# Patient Record
Sex: Male | Born: 1970 | Race: White | Hispanic: No | Marital: Single | Smoking: Current every day smoker
Health system: Southern US, Community
[De-identification: ages and names within clinical notes are randomized; demographics above are authoritative.]

## PROBLEM LIST (undated history)

## (undated) DIAGNOSIS — Z720 Tobacco use: Secondary | ICD-10-CM

## (undated) DIAGNOSIS — M869 Osteomyelitis, unspecified: Secondary | ICD-10-CM

## (undated) DIAGNOSIS — G8929 Other chronic pain: Secondary | ICD-10-CM

## (undated) DIAGNOSIS — M549 Dorsalgia, unspecified: Secondary | ICD-10-CM

## (undated) DIAGNOSIS — D509 Iron deficiency anemia, unspecified: Secondary | ICD-10-CM

## (undated) DIAGNOSIS — I10 Essential (primary) hypertension: Secondary | ICD-10-CM

## (undated) DIAGNOSIS — J449 Chronic obstructive pulmonary disease, unspecified: Secondary | ICD-10-CM

## (undated) DIAGNOSIS — F1990 Other psychoactive substance use, unspecified, uncomplicated: Secondary | ICD-10-CM

## (undated) DIAGNOSIS — I509 Heart failure, unspecified: Secondary | ICD-10-CM

## (undated) DIAGNOSIS — D696 Thrombocytopenia, unspecified: Secondary | ICD-10-CM

## (undated) DIAGNOSIS — R161 Splenomegaly, not elsewhere classified: Secondary | ICD-10-CM

## (undated) DIAGNOSIS — Z9981 Dependence on supplemental oxygen: Secondary | ICD-10-CM

## (undated) HISTORY — PX: BACK SURGERY: SHX140

## (undated) HISTORY — DX: Dependence on supplemental oxygen: Z99.81

## (undated) HISTORY — DX: Chronic obstructive pulmonary disease, unspecified: J44.9

## (undated) HISTORY — PX: FRACTURE SURGERY: SHX138

## (undated) HISTORY — DX: Heart failure, unspecified: I50.9

## (undated) HISTORY — DX: Splenomegaly, not elsewhere classified: R16.1

## (undated) HISTORY — DX: Thrombocytopenia, unspecified: D69.6

## (undated) HISTORY — DX: Iron deficiency anemia, unspecified: D50.9

---

## 1998-02-19 ENCOUNTER — Encounter: Payer: Self-pay | Admitting: Emergency Medicine

## 1998-02-19 ENCOUNTER — Emergency Department (HOSPITAL_COMMUNITY): Admission: EM | Admit: 1998-02-19 | Discharge: 1998-02-19 | Payer: Self-pay | Admitting: Emergency Medicine

## 2000-07-14 DIAGNOSIS — F1110 Opioid abuse, uncomplicated: Secondary | ICD-10-CM | POA: Diagnosis present

## 2000-07-14 DIAGNOSIS — B1920 Unspecified viral hepatitis C without hepatic coma: Secondary | ICD-10-CM | POA: Diagnosis present

## 2000-07-14 DIAGNOSIS — F1410 Cocaine abuse, uncomplicated: Secondary | ICD-10-CM | POA: Diagnosis present

## 2000-07-14 DIAGNOSIS — I509 Heart failure, unspecified: Secondary | ICD-10-CM | POA: Diagnosis present

## 2000-07-14 DIAGNOSIS — F17210 Nicotine dependence, cigarettes, uncomplicated: Secondary | ICD-10-CM | POA: Diagnosis present

## 2000-07-14 DIAGNOSIS — Z9981 Dependence on supplemental oxygen: Secondary | ICD-10-CM

## 2000-07-14 DIAGNOSIS — I110 Hypertensive heart disease with heart failure: Secondary | ICD-10-CM | POA: Diagnosis present

## 2000-07-14 DIAGNOSIS — E872 Acidosis: Secondary | ICD-10-CM | POA: Diagnosis present

## 2000-07-14 DIAGNOSIS — T401X1A Poisoning by heroin, accidental (unintentional), initial encounter: Principal | ICD-10-CM | POA: Diagnosis present

## 2000-07-14 DIAGNOSIS — I272 Pulmonary hypertension, unspecified: Secondary | ICD-10-CM | POA: Diagnosis present

## 2000-07-14 DIAGNOSIS — E875 Hyperkalemia: Secondary | ICD-10-CM | POA: Diagnosis present

## 2000-07-14 DIAGNOSIS — K219 Gastro-esophageal reflux disease without esophagitis: Secondary | ICD-10-CM | POA: Diagnosis present

## 2000-07-14 DIAGNOSIS — Z79899 Other long term (current) drug therapy: Secondary | ICD-10-CM

## 2000-07-14 DIAGNOSIS — J9602 Acute respiratory failure with hypercapnia: Secondary | ICD-10-CM | POA: Diagnosis present

## 2000-07-14 DIAGNOSIS — Z20822 Contact with and (suspected) exposure to covid-19: Secondary | ICD-10-CM | POA: Diagnosis present

## 2000-07-14 DIAGNOSIS — J439 Emphysema, unspecified: Secondary | ICD-10-CM | POA: Diagnosis present

## 2000-07-30 ENCOUNTER — Encounter: Payer: Self-pay | Admitting: Emergency Medicine

## 2000-07-30 ENCOUNTER — Emergency Department (HOSPITAL_COMMUNITY): Admission: EM | Admit: 2000-07-30 | Discharge: 2000-07-30 | Payer: Self-pay | Admitting: Emergency Medicine

## 2002-09-02 ENCOUNTER — Emergency Department (HOSPITAL_COMMUNITY): Admission: EM | Admit: 2002-09-02 | Discharge: 2002-09-02 | Payer: Self-pay | Admitting: Emergency Medicine

## 2002-12-09 ENCOUNTER — Encounter: Payer: Self-pay | Admitting: Emergency Medicine

## 2002-12-09 ENCOUNTER — Emergency Department (HOSPITAL_COMMUNITY): Admission: EM | Admit: 2002-12-09 | Discharge: 2002-12-10 | Payer: Self-pay | Admitting: Emergency Medicine

## 2010-12-31 ENCOUNTER — Emergency Department (HOSPITAL_COMMUNITY): Payer: Self-pay

## 2010-12-31 ENCOUNTER — Encounter (HOSPITAL_COMMUNITY): Payer: Self-pay

## 2010-12-31 ENCOUNTER — Emergency Department (HOSPITAL_COMMUNITY)
Admission: EM | Admit: 2010-12-31 | Discharge: 2011-01-01 | Disposition: A | Discharge status: Home / Self Care | Payer: Self-pay | Attending: Emergency Medicine | Admitting: Emergency Medicine

## 2010-12-31 DIAGNOSIS — R109 Unspecified abdominal pain: Secondary | ICD-10-CM | POA: Insufficient documentation

## 2010-12-31 DIAGNOSIS — R112 Nausea with vomiting, unspecified: Secondary | ICD-10-CM | POA: Insufficient documentation

## 2010-12-31 LAB — COMPREHENSIVE METABOLIC PANEL
ALT: 29 U/L (ref 0–53)
Albumin: 3.9 g/dL (ref 3.5–5.2)
Calcium: 10.1 mg/dL (ref 8.4–10.5)
GFR calc Af Amer: >90 mL/min (ref >90)
Glucose, Bld: 130 mg/dL — ABNORMAL HIGH (ref 70–99)
Sodium: 137 mEq/L (ref 135–145)
Total Protein: 7.7 g/dL (ref 6.0–8.3)

## 2010-12-31 LAB — PROTIME-INR
INR: 1.06 (ref 0.00–1.49)
Prothrombin Time: 14 seconds (ref 11.6–15.2)

## 2010-12-31 LAB — URINALYSIS, ROUTINE W REFLEX MICROSCOPIC
Bilirubin Urine: NEGATIVE
Glucose, UA: NEGATIVE mg/dL
Ketones, ur: NEGATIVE mg/dL
Leukocytes, UA: NEGATIVE
Nitrite: NEGATIVE
Protein, ur: NEGATIVE mg/dL
Specific Gravity, Urine: 1.023 (ref 1.005–1.030)
Urobilinogen, UA: 0.2 mg/dL (ref 0.0–1.0)
pH: 5 (ref 5.0–8.0)

## 2010-12-31 LAB — URINE MICROSCOPIC-ADD ON

## 2010-12-31 LAB — CBC
MCV: 92.1 fL (ref 78.0–100.0)
Platelets: 191 10*3/uL (ref 150–400)
RDW: 13.7 % (ref 11.5–15.5)
WBC: 10.6 10*3/uL — ABNORMAL HIGH (ref 4.0–10.5)

## 2010-12-31 LAB — DIFFERENTIAL
Basophils Absolute: 0 10*3/uL (ref 0.0–0.1)
Basophils Relative: 0 % (ref 0–1)
Eosinophils Absolute: 0.3 10*3/uL (ref 0.0–0.7)
Eosinophils Relative: 3 % (ref 0–5)
Neutrophils Relative %: 73 % (ref 43–77)

## 2011-02-28 ENCOUNTER — Ambulatory Visit: Payer: Self-pay

## 2011-02-28 DIAGNOSIS — IMO0002 Reserved for concepts with insufficient information to code with codable children: Secondary | ICD-10-CM

## 2011-02-28 DIAGNOSIS — M79609 Pain in unspecified limb: Secondary | ICD-10-CM

## 2012-02-06 ENCOUNTER — Encounter (HOSPITAL_COMMUNITY): Payer: Self-pay | Admitting: *Deleted

## 2012-02-06 ENCOUNTER — Emergency Department (HOSPITAL_COMMUNITY)
Admission: EM | Admit: 2012-02-06 | Discharge: 2012-02-06 | Disposition: A | Discharge status: Home / Self Care | Payer: Self-pay | Attending: Emergency Medicine | Admitting: Emergency Medicine

## 2012-02-06 DIAGNOSIS — F17200 Nicotine dependence, unspecified, uncomplicated: Secondary | ICD-10-CM | POA: Insufficient documentation

## 2012-02-06 DIAGNOSIS — G8929 Other chronic pain: Secondary | ICD-10-CM | POA: Insufficient documentation

## 2012-02-06 MED ORDER — OXYCODONE HCL 5 MG PO TABS
10.0000 mg | ORAL_TABLET | Freq: Once | ORAL | Status: AC
  Administered 2012-02-06: 10 mg via ORAL
  Filled 2012-02-06: qty 2

## 2012-02-06 MED ORDER — IBUPROFEN 600 MG PO TABS: 600.0000 mg | ORAL_TABLET | Freq: Four times a day (QID) | ORAL | Status: DC | PRN

## 2012-02-06 MED ORDER — OXYCODONE HCL 10 MG PO TABS: 10.0000 mg | ORAL_TABLET | Freq: Four times a day (QID) | ORAL | Status: DC | PRN

## 2012-02-06 MED ORDER — KETOROLAC TROMETHAMINE 30 MG/ML IJ SOLN
30.0000 mg | Freq: Once | INTRAMUSCULAR | Status: AC
  Administered 2012-02-06: 30 mg via INTRAMUSCULAR
  Filled 2012-02-06: qty 1

## 2012-02-06 NOTE — ED Notes (Signed)
Pt states he was lifting something yesterday and he has pain down his bilateral legs, he states he fell July 2011 and had multiple injuries from a 28 foot fall

## 2012-02-06 NOTE — ED Provider Notes (Signed)
History     CSN: [id number]  Arrival date & time 02/06/12  0124   First MD Initiated Contact with Patient 02/06/12 0159      Chief Complaint  Patient presents with  . Back Pain    (Consider location/radiation/quality/duration/timing/severity/associated sxs/prior treatment) HPI Comments: Patient with a history of chronic back pain.  Since 2008 19 had a fall.  States, that yesterday.  He was helping his aunt move and lifting heavy furniture.  Now has lumbar pain.  That radiates bilaterally to both hips and thighs.  He is been taking his gabapentin, but no other medication.  He denies dysuria, incontinence, constipation/diarrhea, weakness, decreased perineal sensation.  Patient is a 41 y.o. male presenting with back pain.  Back Pain  This is a new problem. The problem occurs constantly. The pain is associated with lifting heavy objects. Pertinent negatives include no fever, no numbness, no dysuria and no weakness.    History reviewed. No pertinent past medical history.  Past Surgical History  Procedure Date  . Back surgery     History reviewed. No pertinent family history.  History  Substance Use Topics  . Smoking status: Current Every Day Smoker -- 1.0 packs/day  . Smokeless tobacco: Not on file  . Alcohol Use: No      Review of Systems  Constitutional: Negative for fever and chills.  HENT: Negative.   Respiratory: Negative for shortness of breath.   Gastrointestinal: Negative for nausea.  Genitourinary: Negative for dysuria, frequency and decreased urine volume.  Musculoskeletal: Positive for back pain.  Skin: Negative for rash and wound.  Neurological: Negative for weakness and numbness.    Allergies  Tylenol  Home Medications   Current Outpatient Rx  Name  Route  Sig  Dispense  Refill  . GABAPENTIN 300 MG PO CAPS   Oral   Take 600 mg by mouth 3 (three) times daily.         . IBUPROFEN 600 MG PO TABS   Oral   Take 1 tablet (600 mg total) by mouth  every 6 (six) hours as needed for pain.   30 tablet   0   . OXYCODONE HCL 10 MG PO TABS   Oral   Take 1 tablet (10 mg total) by mouth every 6 (six) hours as needed for pain.   10 tablet   0     BP 112/81  Pulse 101  Temp 98.4 F (36.9 C) (Oral)  Resp 20  Ht 5\' 11"  (1.803 m)  Wt 185 lb (83.915 kg)  BMI 25.80 kg/m2  SpO2 96%  Physical Exam  Constitutional: He appears well-developed and well-nourished.  Eyes: Pupils are equal, round, and reactive to light.  Neck: Normal range of motion.  Cardiovascular: Normal rate.   Pulmonary/Chest: Effort normal.  Musculoskeletal: Normal range of motion. He exhibits tenderness. He exhibits no edema.       Lumbar back: He exhibits tenderness, pain and spasm. He exhibits normal range of motion, no bony tenderness, no swelling, no edema, no deformity and no laceration.       Back:  Neurological: He is alert.  Skin: No rash noted. No erythema. No pallor.    ED Course  Procedures (including critical care time)  Labs Reviewed - No data to display No results found.   1. Exacerbation of chronic back pain       MDM   Exacerbation of low back pain, with no new symptoms.  No incontinence.  No decreased sensation in  the perineal area.         Arman Filter, NP 02/06/12 1610  Arman Filter, NP 02/06/12 [phone]

## 2012-02-06 NOTE — ED Provider Notes (Addendum)
Medical screening examination/treatment/procedure(s) were performed by non-physician practitioner and as supervising physician I was immediately available for consultation/collaboration.  Sunnie Nielsen, MD 02/06/12 1610  Sunnie Nielsen, MD 02/06/12 [phone]

## 2012-02-10 ENCOUNTER — Encounter (HOSPITAL_COMMUNITY): Payer: Self-pay | Admitting: *Deleted

## 2012-02-10 ENCOUNTER — Emergency Department (HOSPITAL_COMMUNITY): Payer: Self-pay

## 2012-02-10 ENCOUNTER — Encounter (HOSPITAL_COMMUNITY): Payer: Self-pay | Admitting: Emergency Medicine

## 2012-02-10 ENCOUNTER — Emergency Department (HOSPITAL_COMMUNITY)
Admission: EM | Admit: 2012-02-10 | Discharge: 2012-02-10 | Disposition: A | Discharge status: Home / Self Care | Payer: Self-pay | Attending: Emergency Medicine | Admitting: Emergency Medicine

## 2012-02-10 DIAGNOSIS — R319 Hematuria, unspecified: Secondary | ICD-10-CM | POA: Insufficient documentation

## 2012-02-10 DIAGNOSIS — M549 Dorsalgia, unspecified: Secondary | ICD-10-CM | POA: Insufficient documentation

## 2012-02-10 DIAGNOSIS — F17200 Nicotine dependence, unspecified, uncomplicated: Secondary | ICD-10-CM | POA: Insufficient documentation

## 2012-02-10 DIAGNOSIS — G8929 Other chronic pain: Secondary | ICD-10-CM | POA: Insufficient documentation

## 2012-02-10 DIAGNOSIS — R209 Unspecified disturbances of skin sensation: Secondary | ICD-10-CM | POA: Insufficient documentation

## 2012-02-10 DIAGNOSIS — Z79899 Other long term (current) drug therapy: Secondary | ICD-10-CM | POA: Insufficient documentation

## 2012-02-10 DIAGNOSIS — Z9889 Other specified postprocedural states: Secondary | ICD-10-CM | POA: Insufficient documentation

## 2012-02-10 DIAGNOSIS — R0789 Other chest pain: Secondary | ICD-10-CM | POA: Insufficient documentation

## 2012-02-10 DIAGNOSIS — R110 Nausea: Secondary | ICD-10-CM | POA: Insufficient documentation

## 2012-02-10 DIAGNOSIS — R109 Unspecified abdominal pain: Secondary | ICD-10-CM | POA: Insufficient documentation

## 2012-02-10 LAB — URINALYSIS, ROUTINE W REFLEX MICROSCOPIC
Glucose, UA: NEGATIVE mg/dL
Hgb urine dipstick: NEGATIVE
Specific Gravity, Urine: 1.022 (ref 1.005–1.030)

## 2012-02-10 LAB — CBC
Hemoglobin: 17.3 g/dL — ABNORMAL HIGH (ref 13.0–17.0)
MCH: 31.2 pg (ref 26.0–34.0)
RBC: 5.55 MIL/uL (ref 4.22–5.81)

## 2012-02-10 LAB — BASIC METABOLIC PANEL
CO2: 20 mEq/L (ref 19–32)
Glucose, Bld: 90 mg/dL (ref 70–99)
Potassium: 4 mEq/L (ref 3.5–5.1)
Sodium: 138 mEq/L (ref 135–145)

## 2012-02-10 LAB — POCT I-STAT TROPONIN I: Troponin i, poc: 0 ng/mL (ref 0.00–0.08)

## 2012-02-10 MED ORDER — CYCLOBENZAPRINE HCL 10 MG PO TABS: 10.0000 mg | ORAL_TABLET | Freq: Three times a day (TID) | ORAL | Status: DC | PRN

## 2012-02-10 MED ORDER — CYCLOBENZAPRINE HCL 10 MG PO TABS
10.0000 mg | ORAL_TABLET | Freq: Once | ORAL | Status: AC
  Administered 2012-02-10: 10 mg via ORAL
  Filled 2012-02-10: qty 1

## 2012-02-10 MED ORDER — KETOROLAC TROMETHAMINE 60 MG/2ML IM SOLN
60.0000 mg | Freq: Once | INTRAMUSCULAR | Status: AC
  Administered 2012-02-10: 60 mg via INTRAMUSCULAR
  Filled 2012-02-10: qty 2

## 2012-02-10 MED ORDER — OXYCODONE HCL 5 MG PO TABS
10.0000 mg | ORAL_TABLET | Freq: Once | ORAL | Status: AC
  Administered 2012-02-10: 10 mg via ORAL
  Filled 2012-02-10: qty 2

## 2012-02-10 MED ORDER — GABAPENTIN 300 MG PO CAPS
300.0000 mg | ORAL_CAPSULE | Freq: Once | ORAL | Status: AC
  Administered 2012-02-10: 300 mg via ORAL
  Filled 2012-02-10 (×2): qty 1

## 2012-02-10 MED ORDER — SODIUM CHLORIDE 0.9 % IV BOLUS (SEPSIS)
1000.0000 mL | Freq: Once | INTRAVENOUS | Status: AC
  Administered 2012-02-10: 1000 mL via INTRAVENOUS

## 2012-02-10 MED ORDER — GABAPENTIN 300 MG PO CAPS: 300.0000 mg | ORAL_CAPSULE | Freq: Three times a day (TID) | ORAL | Status: DC

## 2012-02-10 NOTE — ED Notes (Signed)
Pt presents w/ back and left leg pain shooting all the way down to his foot. Has old injury which caused screws to be placed in left heel.,

## 2012-02-10 NOTE — ED Notes (Signed)
Patient given discharge instructions, information, prescriptions, and diet order. Patient states that they adequately understand discharge information given and to return to ED if symptoms return or worsen.     

## 2012-02-10 NOTE — ED Notes (Signed)
Patient refusing to leave after discharge plan established. Patient making multiple complaints at this time. Patient is stating that he is hurting all over requesting that x rays and kidney test be done. Vitals signs stable BP 133/91, Spo2 96% on room air, 22 respirations and 92 HR. Patient very upset about being discharged Spoke with physician regarding patient complaints no further treatment ordered at this time. Discharge instructions and prescriptions reviewed with patient.

## 2012-02-10 NOTE — ED Notes (Signed)
Patient requesting testing for his "kidney pain" and an xray before leaving [facility]- will inform Devoria Albe MD of patients request.

## 2012-02-10 NOTE — ED Provider Notes (Signed)
History     CSN: [id number]  Arrival date & time 02/10/12  1316   First MD Initiated Contact with Patient 02/10/12 1502      Chief Complaint  Patient presents with  . Back Pain    (Consider location/radiation/quality/duration/timing/severity/associated sxs/prior treatment) HPI  Patient reports history of chronic back pain since 2008. He  reports he was seeing Dr. Shon Baton at [facility] orthopedics who referred him to Dr. Ethelene Hal pain management. He states his last visit to Dr. Ethelene Hal was in July. He states he's had injections in his back without relief. He states the last time he took his gabapentin on a regular basis was in July. Patient reports 5 days ago he was helping move heavy furniture and he started having a flareup of pain in his back that radiates down his left leg which is chronic and he always has pain radiate down his leg. He states he has back pain daily. Patient was seen in the ED on the 20th and received 10 oxycodone. Patient states he cannot see Dr. Ethelene Hal until December 3. He states the last time he saw him was in July.  He is requesting more oxycodone. He has no incontinence or perineal numbness.   PCP none  History reviewed. No pertinent past medical history.  Past Surgical History  Procedure Date  . Back surgery     states never had surgery    No family history on file.  History   Social History  . Marital Status: Single    Spouse Name: N/A    Number of Children: N/A  . Years of Education: N/A   Occupational History  . Not on file.   Social History Main Topics  . Smoking status: Current Every Day Smoker -- 1.0 packs/day down from 2 ppd    Types: Cigarettes  . Smokeless tobacco: Never Used  . Alcohol Use: No  . Drug Use: No  . Sexually Active:    Other Topics Concern  . Not on file   Social History Narrative  . No narrative on file   Unemployed Wants to get on disability   Review of Systems  All other systems reviewed and are  negative.    Allergies  Tylenol  Home Medications   Current Outpatient Rx  Name  Route  Sig  Dispense  Refill  . GABAPENTIN 300 MG PO CAPS   Oral   Take 600 mg by mouth 3 (three) times daily.         . IBUPROFEN 600 MG PO TABS   Oral   Take 1 tablet (600 mg total) by mouth every 6 (six) hours as needed for pain.   30 tablet   0   . OXYCODONE HCL 10 MG PO TABS   Oral   Take 1 tablet (10 mg total) by mouth every 6 (six) hours as needed for pain.   10 tablet   0   . CYCLOBENZAPRINE HCL 10 MG PO TABS   Oral   Take 1 tablet (10 mg total) by mouth 3 (three) times daily as needed for muscle spasms.   30 tablet   0   . GABAPENTIN 300 MG PO CAPS   Oral   Take 1 capsule (300 mg total) by mouth 3 (three) times daily.   100 capsule   0     BP 142/90  Pulse 89  Temp 97.5 F (36.4 C) (Oral)  Resp 18  SpO2 98%  Vital signs normal    Physical  Exam  Nursing note and vitals reviewed. Constitutional: He is oriented to person, place, and time. He appears well-developed and well-nourished.       Sleeping, ill kept  HENT:  Head: Normocephalic and atraumatic.       Poor dentation  Eyes: Conjunctivae normal and EOM are normal. Pupils are equal, round, and reactive to light.  Neck: Normal range of motion. Neck supple.  Pulmonary/Chest: Effort normal. No respiratory distress.  Musculoskeletal: He exhibits tenderness.       Arms:      Pt has diffuse tenderness of his thoracic and lumbar spine and the paraspinous muscles without localization. Has good ROM of his legs.   Neurological: He is alert and oriented to person, place, and time.  Psychiatric: He has a normal mood and affect.       Flat affect    ED Course  Procedures (including critical care time)   Medications  gabapentin (NEURONTIN) capsule 300 mg (not administered)  ketorolac (TORADOL) injection 60 mg (60 mg Intramuscular Given 02/10/12 1547)  cyclobenzaprine (FLEXERIL) tablet 10 mg (10 mg Oral Given  02/10/12 1547)   Discussed with patient he cannot keep coming to the emergency department to get prescriptions for narcotics. I will get him back on his chronic pain medications which is the gabapentin. He has not been on it for several months. He also has diffuse tenderness of his back consistent with muscle spasm pain and I will started on Flexeril.  1. Chronic back pain    New Prescriptions   CYCLOBENZAPRINE (FLEXERIL) 10 MG TABLET    Take 1 tablet (10 mg total) by mouth 3 (three) times daily as needed for muscle spasms.   GABAPENTIN (NEURONTIN) 300 MG CAPSULE    Take 1 capsule (300 mg total) by mouth 3 (three) times daily.    Plan discharge  Devoria Albe, MD, FACEP    MDM          Ward Givens, MD 02/10/12 [phone]

## 2012-02-10 NOTE — ED Notes (Signed)
Pt put on 2 liters of oxygen via nasal cannula.

## 2012-02-10 NOTE — ED Notes (Addendum)
Patient reports onset of chest pain, flank pain, back pain, and numbness in his left arm this morning.  He is grimacing in pain in triage.  Patient was seen at [facility] long but states they did not do anything for him.  He states he is having burning and pain when voiding as well

## 2012-02-11 NOTE — ED Provider Notes (Signed)
History     CSN: [id number]  Arrival date & time 02/10/12  1747   First MD Initiated Contact with Patient 02/10/12 1902      Chief Complaint  Patient presents with  . Chest Pain  . Flank Pain  . Back Pain  . Numbness   HPI: David Short is a 41 yo CM with history of chronic back pain who presents with right flank and chest pain. He has had chronic flank pain for six months. He describes the pain as squeezing, no inciting events, worsened with movement, relieved with narcotics, associated with hematuria. He has not been evaluated for this pain in the past. He began to have left sided chest pain earlier today. Describes the pain as sharp, non-radiating, no allev or exac factors. Intermittent in nature. He was evaluated earlier today at another ED, they refused to refill is pain medications. He has previously seen pain management for his chronic back pain but not since July. At that time he was prescribed gabapentin but this did not help his pain per his report. He states his back pain is at baseline.   History reviewed. No pertinent past medical history.  Past Surgical History  Procedure Date  . Back surgery     states never had surgery    No family history on file.  History  Substance Use Topics  . Smoking status: Current Every Day Smoker -- 1.0 packs/day    Types: Cigarettes  . Smokeless tobacco: Never Used  . Alcohol Use: No     Review of Systems  Constitutional: Negative for fever, chills and fatigue.  HENT: Negative for trouble swallowing, neck pain and neck stiffness.   Eyes: Negative for photophobia and visual disturbance.  Respiratory: Negative for cough, wheezing and stridor.   Cardiovascular: Positive for chest pain. Negative for palpitations and leg swelling.  Gastrointestinal: Positive for nausea. Negative for vomiting and abdominal pain.  Genitourinary: Positive for hematuria and flank pain. Negative for dysuria, urgency and decreased urine volume.    Musculoskeletal: Negative for myalgias and arthralgias.  Skin: Negative for pallor and rash.  Neurological: Negative for dizziness, seizures and headaches.  Psychiatric/Behavioral: Negative for confusion and agitation.  All other systems reviewed and are negative.    Allergies  Tylenol  Home Medications   Current Outpatient Rx  Name  Route  Sig  Dispense  Refill  . GABAPENTIN 300 MG PO CAPS   Oral   Take 600 mg by mouth 3 (three) times daily.         . IBUPROFEN 600 MG PO TABS   Oral   Take 1 tablet (600 mg total) by mouth every 6 (six) hours as needed for pain.   30 tablet   0   . OXYCODONE HCL 10 MG PO TABS   Oral   Take 1 tablet (10 mg total) by mouth every 6 (six) hours as needed for pain.   10 tablet   0     BP 139/99  Pulse 85  Resp 23  Ht 5\' 11"  (1.803 m)  Wt 185 lb (83.915 kg)  BMI 25.80 kg/m2  SpO2 98%  Physical Exam  Nursing note and vitals reviewed. Constitutional: He is oriented to person, place, and time. He is cooperative. He does not appear ill.       Thin appearing gentleman, unkempt appearance, appears uncomfortable.   HENT:  Head: Normocephalic and atraumatic.  Mouth/Throat: Mucous membranes are dry. Abnormal dentition.  Eyes: EOM are normal. Pupils are  equal, round, and reactive to light.  Neck: Trachea normal and normal range of motion. No JVD present.  Cardiovascular: Normal rate, regular rhythm, S1 normal, S2 normal, normal heart sounds, intact distal pulses and normal pulses.   Pulmonary/Chest: Effort normal and breath sounds normal. No respiratory distress. He has no decreased breath sounds.  Abdominal: Soft. Normal appearance and bowel sounds are normal. There is no tenderness. There is no CVA tenderness.  Musculoskeletal: Normal range of motion.       Arms: Neurological: He is alert and oriented to person, place, and time. He has normal reflexes. No cranial nerve deficit. Coordination normal.  Skin: Skin is warm and dry.    ED  Course  Procedures   Labs Reviewed  CBC - Abnormal; Notable for the following:    Hemoglobin 17.3 (*)     All other components within normal limits  BASIC METABOLIC PANEL  URINALYSIS, ROUTINE W REFLEX MICROSCOPIC  POCT I-STAT TROPONIN I   Dg Chest 2 View  02/10/2012  *RADIOLOGY REPORT*  Clinical Data: Chest pain.  CHEST - 2 VIEW  Comparison: None.  Findings: Lung volumes are low bilaterally with bibasilar atelectasis present, left greater than right.  No overt edema or focal pulmonary consolidation is identified.  The heart size is normal and there is no evidence of pleural fluid.  No bony abnormalities.  IMPRESSION: Low lung volumes with bibasilar atelectasis, left greater than right.   Original Report Authenticated By: Irish Lack, M.D.    1. Chronic back pain   2. Chronic flank pain     MDM  41 yo CM with history of chronic back pain who presents with right flank and chest pain. Afebrile, vital signs stable. Left chest pain not typical of ACS. Screening EKG without evidence of acute ischemia or infarction. Likely MSK in nature. Doubt PE as not tachycardic or hypoxic, PERC negative. Unknown cause of flank pain but it has been present for >6 months, evaluated with UA which was negative for blood or infection. Doubt nephrolithiasis. Patient's back pain at baseline, no evidence of cauda equina. Treated with oral pain medications while in the ED. Discussed with patient importance of seeing his chronic pain doctor for pain medications. We will not refill narcotic prescriptions. Patient felt to be stable for outpatient management as he is HD stable, no acute abnormality on exam or w/u. Return precautions given.    Reviewed imaging, labs and previous medical records, utilized in MDM  Discussed case with Dr. Anitra Lauth  EKG: SR, rate 91, normal axis, T wave flattening in lead III, inverted T waves in aVR and V1. Non-specific changes.   Clinical Impression 1. Chronic flank pain 2. Chest wall  pain 3. Chronic back pain    Margie Billet, MD 02/11/12 [phone]

## 2012-02-19 NOTE — ED Provider Notes (Signed)
I saw and evaluated the patient, reviewed the resident's note and I agree with the findings and plan. I have reviewed EKG and agree with the resident interpretation.  you Pt with chronic back pain that was recently seen just prior to arrival at [facility] at [facility].  Pt was not given narcotic meds and now has various complaints.  Reading the prior note he developed these complaints after being told he would not receive narcotics.  Here labs and EKG are stable.  Feel pt's sx are chronic in nature.  Lower back pain and no concerning findings for acute issues today.  Gwyneth Sprout, MD 02/19/12 [phone]

## 2012-03-01 ENCOUNTER — Encounter (HOSPITAL_COMMUNITY): Payer: Self-pay | Admitting: Physical Medicine and Rehabilitation

## 2012-03-01 ENCOUNTER — Emergency Department (HOSPITAL_COMMUNITY): Payer: Self-pay

## 2012-03-01 ENCOUNTER — Emergency Department (HOSPITAL_COMMUNITY)
Admission: EM | Admit: 2012-03-01 | Discharge: 2012-03-01 | Disposition: A | Discharge status: Home / Self Care | Payer: Self-pay | Attending: Emergency Medicine | Admitting: Emergency Medicine

## 2012-03-01 DIAGNOSIS — Z79899 Other long term (current) drug therapy: Secondary | ICD-10-CM | POA: Insufficient documentation

## 2012-03-01 DIAGNOSIS — F17200 Nicotine dependence, unspecified, uncomplicated: Secondary | ICD-10-CM | POA: Insufficient documentation

## 2012-03-01 DIAGNOSIS — R109 Unspecified abdominal pain: Secondary | ICD-10-CM | POA: Insufficient documentation

## 2012-03-01 DIAGNOSIS — R112 Nausea with vomiting, unspecified: Secondary | ICD-10-CM | POA: Insufficient documentation

## 2012-03-01 DIAGNOSIS — R911 Solitary pulmonary nodule: Secondary | ICD-10-CM | POA: Insufficient documentation

## 2012-03-01 LAB — CBC WITH DIFFERENTIAL/PLATELET
Basophils Absolute: 0 10*3/uL (ref 0.0–0.1)
Eosinophils Relative: 1 % (ref 0–5)
Lymphocytes Relative: 8 % — ABNORMAL LOW (ref 12–46)
MCV: 89.1 fL (ref 78.0–100.0)
Neutro Abs: 10.3 10*3/uL — ABNORMAL HIGH (ref 1.7–7.7)
Neutrophils Relative %: 86 % — ABNORMAL HIGH (ref 43–77)
Platelets: 200 10*3/uL (ref 150–400)
RDW: 13.3 % (ref 11.5–15.5)
WBC: 12 10*3/uL — ABNORMAL HIGH (ref 4.0–10.5)

## 2012-03-01 LAB — URINALYSIS, ROUTINE W REFLEX MICROSCOPIC
Bilirubin Urine: NEGATIVE
Specific Gravity, Urine: 1.013 (ref 1.005–1.030)
Urobilinogen, UA: 1 mg/dL (ref 0.0–1.0)
pH: 8 (ref 5.0–8.0)

## 2012-03-01 LAB — LIPASE, BLOOD: Lipase: 29 U/L (ref 11–59)

## 2012-03-01 LAB — COMPREHENSIVE METABOLIC PANEL
ALT: 123 U/L — ABNORMAL HIGH (ref 0–53)
AST: 47 U/L — ABNORMAL HIGH (ref 0–37)
CO2: 25 mEq/L (ref 19–32)
Calcium: 10.2 mg/dL (ref 8.4–10.5)
GFR calc non Af Amer: >90 mL/min (ref >90)
Sodium: 138 mEq/L (ref 135–145)

## 2012-03-01 LAB — URINE MICROSCOPIC-ADD ON

## 2012-03-01 MED ORDER — SODIUM CHLORIDE 0.9 % IV BOLUS (SEPSIS)
1000.0000 mL | Freq: Once | INTRAVENOUS | Status: AC
  Administered 2012-03-01: 1000 mL via INTRAVENOUS

## 2012-03-01 MED ORDER — ONDANSETRON HCL 4 MG/2ML IJ SOLN
4.0000 mg | Freq: Once | INTRAMUSCULAR | Status: AC
  Administered 2012-03-01: 4 mg via INTRAVENOUS
  Filled 2012-03-01: qty 2

## 2012-03-01 MED ORDER — MORPHINE SULFATE 4 MG/ML IJ SOLN
6.0000 mg | Freq: Once | INTRAMUSCULAR | Status: AC
  Administered 2012-03-01: 6 mg via INTRAVENOUS
  Filled 2012-03-01: qty 2

## 2012-03-01 MED ORDER — ONDANSETRON HCL 4 MG PO TABS: 4.0000 mg | ORAL_TABLET | Freq: Four times a day (QID) | ORAL | Status: DC

## 2012-03-01 MED ORDER — IOHEXOL 300 MG/ML  SOLN
20.0000 mL | INTRAMUSCULAR | Status: AC
  Administered 2012-03-01: 20 mL via ORAL

## 2012-03-01 MED ORDER — OXYCODONE HCL 5 MG PO TABS: 10.0000 mg | ORAL_TABLET | ORAL | Status: DC | PRN

## 2012-03-01 MED ORDER — IOHEXOL 300 MG/ML  SOLN
100.0000 mL | Freq: Once | INTRAMUSCULAR | Status: AC | PRN
  Administered 2012-03-01: 100 mL via INTRAVENOUS

## 2012-03-01 NOTE — ED Notes (Signed)
The patient is AOx4 and comfortable with his discharge instructions.  The patient's aunt is driving him home.

## 2012-03-01 NOTE — ED Notes (Signed)
Pt complaining of severe abdominal pain and N,V. Pt hyperventilating at triage.

## 2012-03-01 NOTE — ED Provider Notes (Signed)
History     CSN: [id number]  Arrival date & time 03/01/12  1617   First MD Initiated Contact with Patient 03/01/12 1707      Chief Complaint  Patient presents with  . Abdominal Pain    (Consider location/radiation/quality/duration/timing/severity/associated sxs/prior treatment) HPI  David Short is a 41 y.o. male complaining of severe nausea vomiting and bilateral lower quadrant abdominal pain onset acute at 9 AM today. No prior similar episodes Patient is now tolerating by mouth at this time. Denies change in bowel or bladder habits, chest pain. Patient's never had prior abdominal surgery.  History reviewed. No pertinent past medical history.  Past Surgical History  Procedure Date  . Back surgery     states never had surgery    History reviewed. No pertinent family history.  History  Substance Use Topics  . Smoking status: Current Every Day Smoker -- 1.0 packs/day    Types: Cigarettes  . Smokeless tobacco: Never Used  . Alcohol Use: No      Review of Systems  Constitutional: Negative for fever.  Respiratory: Negative for shortness of breath.   Cardiovascular: Negative for chest pain.  Gastrointestinal: Positive for nausea, vomiting and abdominal pain. Negative for diarrhea.  All other systems reviewed and are negative.    Allergies  Tylenol  Home Medications   Current Outpatient Rx  Name  Route  Sig  Dispense  Refill  . GABAPENTIN 300 MG PO CAPS   Oral   Take 600 mg by mouth 3 (three) times daily.         . IBUPROFEN 600 MG PO TABS   Oral   Take 1 tablet (600 mg total) by mouth every 6 (six) hours as needed for pain.   30 tablet   0   . OXYCODONE HCL 10 MG PO TABS   Oral   Take 1 tablet (10 mg total) by mouth every 6 (six) hours as needed for pain.   10 tablet   0     BP 143/91  Pulse 62  Resp 26  SpO2 100%  Physical Exam  Nursing note and vitals reviewed. Constitutional: He is oriented to person, place, and time. He appears  well-developed and well-nourished. No distress.  HENT:  Head: Normocephalic and atraumatic.  Mouth/Throat: Oropharynx is clear and moist.  Eyes: Conjunctivae normal and EOM are normal. Pupils are equal, round, and reactive to light.  Neck: Normal range of motion. No JVD present.  Cardiovascular: Normal rate, regular rhythm and intact distal pulses.   Pulmonary/Chest: Effort normal and breath sounds normal. No stridor. No respiratory distress. He has no wheezes. He has no rales. He exhibits no tenderness.  Abdominal: Soft. Bowel sounds are normal. He exhibits no distension and no mass. There is tenderness. There is no rebound and no guarding.       Very tender to light palpation of RLQ and LLQ . Patient describes left is worse than right. No rebound  Musculoskeletal: Normal range of motion.  Neurological: He is alert and oriented to person, place, and time.  Psychiatric: He has a normal mood and affect.    ED Course  Procedures (including critical care time)  Labs Reviewed  CBC WITH DIFFERENTIAL - Abnormal; Notable for the following:    WBC 12.0 (*)     Neutrophils Relative 86 (*)     Neutro Abs 10.3 (*)     Lymphocytes Relative 8 (*)     All other components within normal limits  COMPREHENSIVE  METABOLIC PANEL - Abnormal; Notable for the following:    Glucose, Bld 116 (*)     AST 47 (*)     ALT 123 (*)     All other components within normal limits  URINALYSIS, ROUTINE W REFLEX MICROSCOPIC - Abnormal; Notable for the following:    APPearance CLOUDY (*)     Hgb urine dipstick MODERATE (*)     All other components within normal limits  URINE MICROSCOPIC-ADD ON  LIPASE, BLOOD   Ct Abdomen Pelvis W Contrast  03/01/2012  *RADIOLOGY REPORT*  Clinical Data: Bilateral abdominal pain, nausea, vomiting, fever  CT ABDOMEN AND PELVIS WITH CONTRAST  Technique:  Multidetector CT imaging of the abdomen and pelvis was performed following the standard protocol during bolus administration of  intravenous contrast. Sagittal and coronal MPR images reconstructed from axial data set.  Contrast: OMNIPAQUE IOHEXOL 300 MG/ML  SOLN Dilute oral contrast.  Comparison: 12/31/2010  Findings: Bibasilar atelectasis. Question 5 mm subpleural nodule left lower lobe versus atelectasis. Probable tiny left renal cysts. Liver, spleen, pancreas, kidneys, and adrenal glands otherwise normal appearance. Scattered normal-sized mesenteric nodes. Normal-appearing bladder and ureters. Normal appendix. No mass, adenopathy, free fluid, or inflammatory process.  Incidentally noted mildly prominent venous collaterals in the pelvis without obvious vascular occlusion or other vascular anomaly, uncertain significance. Compression fracture L2 superior endplate unchanged.  IMPRESSION: No definite acute intra abdominal or intrapelvic abnormalities. Old L2 superior endplate compression fracture. Question 5 mm diameter subpleural nodule left lower lobe, recommendation below.  If the patient is at high risk for bronchogenic carcinoma, follow- up chest CT at 6-12 months is recommended.  If the patient is at low risk for bronchogenic carcinoma, follow-up chest CT at 12 months is recommended.  This recommendation follows the consensus statement: Guidelines for Management of Small Pulmonary Nodules Detected on CT Scans: A Statement from the Fleischner Society as published in Radiology 2005; 237:395-400.   Original Report Authenticated By: Ulyses Southward, M.D.      1. Abdominal pain   2. Lung nodule seen on imaging study       MDM  Patient with acute onset of bilateral lower abdominal pain with nausea and vomiting.  Patient with mild elevation in LFTs. Lipase is normal. Mild leukocytosis of 12.0.  Patient feels significantly better after first round of pain medication. He is drinking his contrast without issue and tolerating by mouth.  Discussed case with attending who agrees with plan and stability to d/c to home.    Pt  verbalized understanding and agrees with care plan. Outpatient follow-up and return precautions given.    New Prescriptions   ONDANSETRON (ZOFRAN) 4 MG TABLET    Take 1 tablet (4 mg total) by mouth every 6 (six) hours.   OXYCODONE (ROXICODONE) 5 MG IMMEDIATE RELEASE TABLET    Take 2 tablets (10 mg total) by mouth every 4 (four) hours as needed for pain.          Wynetta Emery, PA-C 03/01/12 2233

## 2012-03-01 NOTE — ED Provider Notes (Signed)
Medical screening examination/treatment/procedure(s) were performed by non-physician practitioner and as supervising physician I was immediately available for consultation/collaboration.   Charles B. Bernette Mayers, MD 03/01/12 2233

## 2012-03-17 ENCOUNTER — Emergency Department (HOSPITAL_COMMUNITY)
Admission: EM | Admit: 2012-03-17 | Discharge: 2012-03-17 | Disposition: A | Discharge status: Home / Self Care | Payer: Self-pay | Attending: Emergency Medicine | Admitting: Emergency Medicine

## 2012-03-17 ENCOUNTER — Emergency Department (HOSPITAL_COMMUNITY): Payer: Self-pay

## 2012-03-17 ENCOUNTER — Encounter (HOSPITAL_COMMUNITY): Payer: Self-pay | Admitting: Emergency Medicine

## 2012-03-17 DIAGNOSIS — R1031 Right lower quadrant pain: Secondary | ICD-10-CM | POA: Insufficient documentation

## 2012-03-17 DIAGNOSIS — G8929 Other chronic pain: Secondary | ICD-10-CM | POA: Insufficient documentation

## 2012-03-17 DIAGNOSIS — F17200 Nicotine dependence, unspecified, uncomplicated: Secondary | ICD-10-CM | POA: Insufficient documentation

## 2012-03-17 DIAGNOSIS — R109 Unspecified abdominal pain: Secondary | ICD-10-CM

## 2012-03-17 LAB — URINALYSIS, ROUTINE W REFLEX MICROSCOPIC
Glucose, UA: NEGATIVE mg/dL
Leukocytes, UA: NEGATIVE
Protein, ur: 100 mg/dL — AB
Specific Gravity, Urine: 1.026 (ref 1.005–1.030)

## 2012-03-17 LAB — POCT I-STAT, CHEM 8
BUN: 11 mg/dL (ref 6–23)
Calcium, Ion: 1.1 mmol/L — ABNORMAL LOW (ref 1.12–1.23)
Chloride: 103 mEq/L (ref 96–112)
HCT: 42 % (ref 39.0–52.0)
Potassium: 3.8 mEq/L (ref 3.5–5.1)
Sodium: 136 mEq/L (ref 135–145)

## 2012-03-17 LAB — URINE MICROSCOPIC-ADD ON

## 2012-03-17 MED ORDER — MORPHINE SULFATE 4 MG/ML IJ SOLN
4.0000 mg | Freq: Once | INTRAMUSCULAR | Status: AC
  Administered 2012-03-17: 4 mg via INTRAVENOUS
  Filled 2012-03-17: qty 1

## 2012-03-17 MED ORDER — IBUPROFEN 800 MG PO TABS: 800.0000 mg | ORAL_TABLET | Freq: Three times a day (TID) | ORAL | Status: DC

## 2012-03-17 MED ORDER — PROMETHAZINE HCL 25 MG PO TABS: 25.0000 mg | ORAL_TABLET | Freq: Four times a day (QID) | ORAL | Status: DC | PRN

## 2012-03-17 MED ORDER — OXYCODONE-ACETAMINOPHEN 5-325 MG PO TABS
1.0000 | ORAL_TABLET | Freq: Once | ORAL | Status: DC
  Filled 2012-03-17: qty 1

## 2012-03-17 MED ORDER — IBUPROFEN 800 MG PO TABS
800.0000 mg | ORAL_TABLET | Freq: Once | ORAL | Status: AC
  Administered 2012-03-17: 800 mg via ORAL
  Filled 2012-03-17: qty 1

## 2012-03-17 MED ORDER — ONDANSETRON HCL 4 MG/2ML IJ SOLN
4.0000 mg | Freq: Once | INTRAMUSCULAR | Status: AC
  Administered 2012-03-17: 4 mg via INTRAVENOUS
  Filled 2012-03-17: qty 2

## 2012-03-17 NOTE — ED Notes (Signed)
Pt c/o R flank pain for the last 2 days. Pt states he has some burning when he urinates. No obvious blood in urine. Pt denies hx of kidney stones.

## 2012-03-17 NOTE — ED Provider Notes (Signed)
Medical screening examination/treatment/procedure(s) were performed by non-physician practitioner and as supervising physician I was immediately available for consultation/collaboration.  John-Adam Paullette Mckain, M.D.     John-Adam Everlynn Sagun, MD 03/17/12 1819 

## 2012-03-17 NOTE — ED Provider Notes (Signed)
Assumed patient care from [facility] pending [place] to evaluate for Kidney stones. [place] negative. Review of records support possible drug seeking behavior (denied h/o flank pain to initial practitioner but has record of multiple evaluations for chronic flank pain; is managed or has been managed at [facility]; record shows (per [employer]) he has become agitated and angry when not given narcotics in the past). Ultrasound is negative for renal or ureteral stones, hydronephrosis or other negative finding. Feel he can be discharged according to original plan discussed with patient by Kyung Bacca of Ibuprofen 800 mg, Phenergan and follow up outpatient.  Rodena Medin, PA-C 03/17/12 [phone]

## 2012-03-17 NOTE — ED Notes (Signed)
Patient transported to Ultrasound 

## 2012-03-17 NOTE — ED Provider Notes (Signed)
History     CSN: [id number]  Arrival date & time 03/17/12  0014   First MD Initiated Contact with Patient 03/17/12 [phone]      Chief Complaint  Patient presents with  . Flank Pain    (Consider location/radiation/quality/duration/timing/severity/associated sxs/prior treatment) HPI History provided by pt and prior chart.  Pt c/o severe, sharp, right flank and right-sided abd pain x 2 days.  Has gradually worsened.  Associated w/ N/V and hematuria.  Denies fever, diarrhea, hematemesis/hematochezia/melena, other urinary sx and testicular pain.  Denies trauma.  No prior h/o kidney stone.  Reports never having pain like this in the past.  Per prior chart, pt presented w/ similar sx on 02/10/12 and reported at that time that it had been going on for 6 months.  No hematuria at that time, low clinical suspicion for kidney stone and no imaging performed.  Pt did however have a CT abd/pelvis to evaluate for diffuse lower abd pain on 03/01/12 and  No past medical history on file.  Past Surgical History  Procedure Date  . Back surgery     states never had surgery    No family history on file.  History  Substance Use Topics  . Smoking status: Current Every Day Smoker -- 1.0 packs/day    Types: Cigarettes  . Smokeless tobacco: Never Used  . Alcohol Use: No      Review of Systems  All other systems reviewed and are negative.    Allergies  Tylenol  Home Medications  No current outpatient prescriptions on file.  BP 139/94  Pulse 99  Temp 97.8 F (36.6 C) (Oral)  Resp 22  SpO2 100%  Physical Exam  Nursing note and vitals reviewed. Constitutional: He is oriented to person, place, and time. He appears well-developed and well-nourished. No distress.       Uncomfortable appearing.    HENT:  Head: Normocephalic and atraumatic.       Poor dentition  Eyes:       Normal appearance  Neck: Normal range of motion.  Cardiovascular: Normal rate and regular rhythm.   Pulmonary/Chest:  Effort normal and breath sounds normal. No respiratory distress.  Abdominal: Soft. Bowel sounds are normal. He exhibits no distension. There is no rebound and no guarding.       Diffuse ttp  Genitourinary:       R CVA ttp  Musculoskeletal: Normal range of motion.  Neurological: He is alert and oriented to person, place, and time.  Skin: Skin is warm and dry. No rash noted.  Psychiatric: He has a normal mood and affect. His behavior is normal.    ED Course  Procedures (including critical care time)  Labs Reviewed  URINALYSIS, ROUTINE W REFLEX MICROSCOPIC - Abnormal; Notable for the following:    Color, Urine AMBER (*)  BIOCHEMICALS MAY BE AFFECTED BY COLOR   APPearance CLOUDY (*)     Hgb urine dipstick LARGE (*)     Bilirubin Urine SMALL (*)     Ketones, ur TRACE (*)     Protein, ur 100 (*)     All other components within normal limits  POCT I-STAT, CHEM 8 - Abnormal; Notable for the following:    Glucose, Bld 114 (*)     Calcium, Ion 1.10 (*)     All other components within normal limits  URINE MICROSCOPIC-ADD ON   US Renal  03/17/2012  *RADIOLOGY REPORT*  Clinical Data: Right flank pain for 3 days.  RENAL/URINARY TRACT ULTRASOUND  COMPLETE  Comparison:  03/01/2012  Findings:  Right Kidney:  Measures 11.4 cm in length.  Normal echogenicity and echotexture.  No renal stones, mass, or hydronephrosis.  Left Kidney:  Measures 12.9 cm in length, and likewise appears normal.  Bladder:  Unremarkable, with bilateral ureteral jets observed.  IMPRESSION:  1.  Normal sonographic appearance of the kidneys and urinary bladder.   Original Report Authenticated By: Gaylyn Rong, M.D.      1. Chronic right flank pain       MDM  41yo M w/ 2d severe R flank pain + N/V and hematuria.  No h/o kidney stones and reports never having pain like this before.  Per prior chart, h/o chronic R flank pain and drug-seeking behavior.  Normal CT abd/pelvis on 12/13 when he presented to ED w/ diffuse lower  abd pain.  On exam, afebrile, uncomfortable appearing, right-sided abd and right CVA ttp.  Labs sig for hematuria.  Pt received IV NS, zofran and 2 doses of morphine for pain.  Renal US pending to r/o nephrolithiasis.  I suspect that patient is malingering.  He understands that if his [place] is neg, he will not be prescribed narcotics.  Narvaez, PA-C to dispo.          Otilio Miu, PA-C 03/17/12 [phone]

## 2012-03-17 NOTE — ED Provider Notes (Signed)
Medical screening examination/treatment/procedure(s) were performed by non-physician practitioner and as supervising physician I was immediately available for consultation/collaboration.  Jones Skene, M.D.     Jones Skene, MD 03/17/12 1821

## 2012-11-29 ENCOUNTER — Emergency Department (HOSPITAL_COMMUNITY)
Admission: EM | Admit: 2012-11-29 | Discharge: 2012-11-29 | Disposition: A | Discharge status: Home / Self Care | Payer: Self-pay | Attending: Emergency Medicine | Admitting: Emergency Medicine

## 2012-11-29 ENCOUNTER — Emergency Department (HOSPITAL_COMMUNITY): Payer: Self-pay

## 2012-11-29 ENCOUNTER — Encounter (HOSPITAL_COMMUNITY): Payer: Self-pay | Admitting: Emergency Medicine

## 2012-11-29 DIAGNOSIS — T401X1A Poisoning by heroin, accidental (unintentional), initial encounter: Secondary | ICD-10-CM | POA: Insufficient documentation

## 2012-11-29 DIAGNOSIS — Y92009 Unspecified place in unspecified non-institutional (private) residence as the place of occurrence of the external cause: Secondary | ICD-10-CM | POA: Insufficient documentation

## 2012-11-29 DIAGNOSIS — M549 Dorsalgia, unspecified: Secondary | ICD-10-CM | POA: Insufficient documentation

## 2012-11-29 DIAGNOSIS — Y939 Activity, unspecified: Secondary | ICD-10-CM | POA: Insufficient documentation

## 2012-11-29 DIAGNOSIS — T401X4A Poisoning by heroin, undetermined, initial encounter: Secondary | ICD-10-CM | POA: Insufficient documentation

## 2012-11-29 DIAGNOSIS — Z79899 Other long term (current) drug therapy: Secondary | ICD-10-CM | POA: Insufficient documentation

## 2012-11-29 DIAGNOSIS — F17200 Nicotine dependence, unspecified, uncomplicated: Secondary | ICD-10-CM | POA: Insufficient documentation

## 2012-11-29 LAB — ACETAMINOPHEN LEVEL: Acetaminophen (Tylenol), Serum: <15 ug/mL (ref 10–30)

## 2012-11-29 LAB — SALICYLATE LEVEL: Salicylate Lvl: <2 mg/dL — ABNORMAL LOW (ref 2.8–20.0)

## 2012-11-29 MED ORDER — SODIUM CHLORIDE 0.9 % IV BOLUS (SEPSIS)
1000.0000 mL | Freq: Once | INTRAVENOUS | Status: AC
  Administered 2012-11-29: 1000 mL via INTRAVENOUS

## 2012-11-29 MED ORDER — ALBUTEROL SULFATE (5 MG/ML) 0.5% IN NEBU
2.5000 mg | INHALATION_SOLUTION | Freq: Once | RESPIRATORY_TRACT | Status: AC
  Administered 2012-11-29: 2.5 mg via RESPIRATORY_TRACT
  Filled 2012-11-29: qty 0.5

## 2012-11-29 NOTE — ED Notes (Signed)
Pt placed on O2 d/t low O2 sats. Dr Fayrene Fearing aware

## 2012-11-29 NOTE — ED Notes (Signed)
Pt given crackers, peanut butter, sprite per Dr Fayrene Fearing, EDP

## 2012-11-29 NOTE — ED Provider Notes (Signed)
CSN: [id number]     Arrival date & time 11/29/12  1533 History   First MD Initiated Contact with Patient 11/29/12 1546     Chief Complaint  Patient presents with  . Drug Overdose    HPI   Patient has history of chronic pain. He states ever for 5 days uses heroin. He shot up some today. Also shots and crushed up OxyContin. He is on the street. He denies taking anything by mouth. He is on any regular medications, nor did he ingest anything today. He denies this was a suicide attempt. States "it does help the pain. He was in the bathroom who was brought. He was not responsive to his family who is at ambulate him. They called the police and paramedics. Fire access the door. He was given IM Narcan and became more responsive. He was not apneic, he was not cyanotic, he was unresponsive.  History reviewed. No pertinent past medical history. Past Surgical History  Procedure Laterality Date  . Back surgery      states never had surgery   No family history on file. History  Substance Use Topics  . Smoking status: Current Every Day Smoker -- 1.00 packs/day    Types: Cigarettes  . Smokeless tobacco: Never Used  . Alcohol Use: No    Review of Systems  Constitutional: Negative for fever.  HENT: Negative for neck pain.   Eyes: Negative for visual disturbance.  Respiratory: Negative for chest tightness and shortness of breath.   Cardiovascular: Negative for chest pain.  Gastrointestinal: Positive for nausea. Negative for abdominal pain.  Musculoskeletal: Positive for back pain.  Skin:       No complications it is shooting sites, no abscesses  Neurological: Negative for dizziness and headaches.  Psychiatric/Behavioral: Negative for dysphoric mood.    Allergies  Tylenol  Home Medications   Current Outpatient Rx  Name  Route  Sig  Dispense  Refill  . ibuprofen (ADVIL,MOTRIN) 800 MG tablet   Oral   Take 1 tablet (800 mg total) by mouth 3 (three) times daily.   12 tablet   0   .  promethazine (PHENERGAN) 25 MG tablet   Oral   Take 1 tablet (25 mg total) by mouth every 6 (six) hours as needed for nausea.   20 tablet   0    BP 108/61  Pulse 96  Temp(Src) 97.9 F (36.6 C) (Oral)  Resp 13  SpO2 95% Physical Exam  Constitutional:  He is awake and alert now. He has some recollection for the event  HENT:  Head: Head is with contusion. Head is without Battle's sign.    Eyes:    Pulmonary/Chest:  Normal rate and depth of respirations.  Abdominal:  Benign abdomen  Neurological:  Oriented x3 not sedate. Lucid.  Psychiatric:  He is calm    ED Course  Procedures (including critical care time) Labs Review Labs Reviewed  SALICYLATE LEVEL - Abnormal; Notable for the following:    Salicylate Lvl <2.0 (*)    All other components within normal limits  ACETAMINOPHEN LEVEL   Imaging Review Dg Chest Port 1 View  11/29/2012   *RADIOLOGY REPORT*  Clinical Data: Drug overdose  PORTABLE CHEST - 1 VIEW  Comparison: 02/10/2012  Findings: Heart size and vascular pattern are normal.  Similar to prior study, there is scarring or atelectasis laterally in the left base.  There is also mild right base atelectasis.  IMPRESSION: Bibasilar attenuation, appearing most consistent with atelectasis on the  right and probably chronic scarring on the left.   Original Report Authenticated By: Esperanza Heir, M.D.    MDM   1. Heroin overdose, initial encounter    Patient is status post Narcan rescue from an intentional drug use unintentional overdose. We'll screen for his aspirin salicylates and plan a period of observation.  He is initially somewhat hypoxemic with sats of 90-91%. His lungs are clear with diminished bases. Chest x-ray shows atelectasis at the bases. No sign of the noncardiac pulmonary edema. He was given incentive spirometer and has used over several hours. His given a nebulized albuterol treatment. He is currently saturating 93% on room air awake alert lucid  oriented. He denies that this is a suicide attempt. He was offered and declined his rehabilitation for his narcotic use. He is discharged.    Claudean Kinds, MD 11/29/12 [phone]

## 2012-11-29 NOTE — ED Notes (Signed)
Pt from home reports that he injected Oxycodone/Heroin mixture and then "passed out". Pt has small amount of dried blood to L forehead, but no injury can be identified. Pt is A&O and in NAD. Pt requesting something to eat and drink.

## 2012-11-29 NOTE — ED Notes (Signed)
Pt from home via EMS with OD per pt "Oxy/Heroin" OD. Per EMS, pt was found unresponsive initially by Fire Dept on seen an was given 1mg  Narcan IM.  Pt was found A&O by EMS, drug paraphernalia around pt. Pt given 1mg  Narcan IM en route.

## 2016-08-18 ENCOUNTER — Emergency Department (HOSPITAL_COMMUNITY): Payer: Self-pay

## 2016-08-18 ENCOUNTER — Encounter (HOSPITAL_COMMUNITY): Payer: Self-pay | Admitting: Oncology

## 2016-08-18 ENCOUNTER — Emergency Department (HOSPITAL_COMMUNITY)
Admission: EM | Admit: 2016-08-18 | Discharge: 2016-08-19 | Disposition: A | Discharge status: Home / Self Care | Payer: Self-pay | Attending: Emergency Medicine | Admitting: Emergency Medicine

## 2016-08-18 DIAGNOSIS — Z791 Long term (current) use of non-steroidal anti-inflammatories (NSAID): Secondary | ICD-10-CM | POA: Insufficient documentation

## 2016-08-18 DIAGNOSIS — L03115 Cellulitis of right lower limb: Secondary | ICD-10-CM | POA: Insufficient documentation

## 2016-08-18 DIAGNOSIS — F17210 Nicotine dependence, cigarettes, uncomplicated: Secondary | ICD-10-CM | POA: Insufficient documentation

## 2016-08-18 MED ORDER — VANCOMYCIN HCL IN DEXTROSE 1-5 GM/200ML-% IV SOLN
1000.0000 mg | Freq: Once | INTRAVENOUS | Status: AC
  Administered 2016-08-19: 1000 mg via INTRAVENOUS
  Filled 2016-08-18: qty 200

## 2016-08-18 NOTE — ED Triage Notes (Signed)
Per pt he has a, "Spot come to a head."  On his right knee 3 days ago.  Pt now has warmth, redness and pain extending up to right thigh and down to right calf.  Pt also has a 5.5 cm x 7 cm wound to right knee w/ 0.5 cm of tunneling.  Exudate draining out.  Culture collected.  Pt rates pain 10/10, throbbing in nature.

## 2016-08-18 NOTE — ED Notes (Signed)
Patient aware of urine sample, urinal at bedside.

## 2016-08-18 NOTE — ED Notes (Signed)
Bed: [facility] Expected date:  Expected time:  Means of arrival:  Comments: 

## 2016-08-18 NOTE — ED Notes (Signed)
Pt's O2 sat continues to drop to 86-88% on RA.  2L O2 applied via [place] w/ an improvement in O2 sat to 96%.

## 2016-08-18 NOTE — ED Provider Notes (Signed)
[facility]-EMERGENCY DEPT Provider Note   CSN: [id number] Arrival date & time: 08/18/16  2201  By signing my name below, I, [name], attest that this documentation has been prepared under the direction and in the presence of [name], MD. Electronically Signed: [name], ED Scribe. 08/19/16. 12:09 AM.  History   Chief Complaint Chief Complaint  Patient presents with  . Cellulitis    Wound infection   The history is provided by the patient. No language interpreter was used.    HPI Comments: David Short is a 46 y.o. male , with a hx of gout, who presents to the Emergency Department complaining of lower moderate, gradually worsening right knee pain with associated wound. Pt reports that he noticed a cyst on Wednesday (08/15/2016) and  that on Friday the area had enlarged where he then decided to "pop" the cyst.  He reports that he cracked his L1 and L2 vertebrae, in a fall where he fell 37 feet. He states that he soaked his right leg in an epsom salt bath with inadequate relief. He currently lives in a house. The pt denies taking any OTC medication for his symptoms. He denies similar past symptoms, and injury to the site.   No past medical history on file.  There are no active problems to display for this patient.   Past Surgical History:  Procedure Laterality Date  . BACK SURGERY     states never had surgery  . FRACTURE SURGERY Left    elbow/wrist/ankle       Home Medications    Prior to Admission medications   Medication Sig Start Date End Date Taking? Authorizing Provider  ibuprofen (ADVIL,MOTRIN) 200 MG tablet Take 400 mg by mouth every 6 (six) hours as needed for headache, mild pain or moderate pain.   Yes [provider]  cephALEXin (KEFLEX) 500 MG capsule Take 1 capsule (500 mg total) by mouth 4 (four) times daily. 08/19/16   [name], MD  ibuprofen (ADVIL,MOTRIN) 800 MG tablet Take 1 tablet (800 mg total) by mouth 3 (three) times  daily. Patient not taking: Reported on 08/18/2016 03/17/12   Schinlever, Santina Evans, PA-C  oxyCODONE (ROXICODONE) 5 MG immediate release tablet Take 1 tablet (5 mg total) by mouth every 4 (four) hours as needed for severe pain. 08/19/16   [name], MD  promethazine (PHENERGAN) 25 MG tablet Take 1 tablet (25 mg total) by mouth every 6 (six) hours as needed for nausea. Patient not taking: Reported on 08/18/2016 03/17/12   Schinlever, Santina Evans, PA-C  sulfamethoxazole-trimethoprim (BACTRIM DS,SEPTRA DS) 800-160 MG tablet Take 1 tablet by mouth 2 (two) times daily. 08/19/16 08/26/16  [name], MD    Family History No family history on file.  Social History Social History  Substance Use Topics  . Smoking status: Current Every Day Smoker    Packs/day: 1.00    Types: Cigarettes  . Smokeless tobacco: Never Used  . Alcohol use No     Allergies   Tylenol [acetaminophen]   Review of Systems Review of Systems  Constitutional: Negative for fever.  Skin: Positive for color change and wound.  All other systems reviewed and are negative.    Physical Exam Updated Vital Signs BP 126/82   Pulse 93   Temp 98.6 F (37 C) (Oral)   Resp 16   Ht 5\' 11"  (1.803 m)   Wt 77.1 kg (170 lb)   SpO2 95%   BMI 23.71 kg/m   Physical Exam  Constitutional: He  is oriented to person, place, and time. He appears well-developed and well-nourished.  HENT:  Head: Normocephalic and atraumatic.  Right Ear: External ear normal.  Left Ear: External ear normal.  Eyes: Conjunctivae and EOM are normal. Pupils are equal, round, and reactive to light.  Neck: Normal range of motion and phonation normal. Neck supple.  Cardiovascular: Normal rate, regular rhythm and normal heart sounds.   Pulmonary/Chest: Effort normal and breath sounds normal. He exhibits no bony tenderness.  Abdominal: Soft. There is no tenderness.  Musculoskeletal: Normal range of motion.  Decreased movement in right lower leg secondary to  pain.  Neurological: He is alert and oriented to person, place, and time. No cranial nerve deficit or sensory deficit. He exhibits normal muscle tone. Coordination normal.  Skin: Skin is warm, dry and intact.  Right leg redness and swelling above the right knee to shin area The area has been demarcated by ink border 1.5x3cm but surrounding excoriation and induration Small amount of exudate in the wound  Psychiatric: He has a normal mood and affect. His behavior is normal. Judgment and thought content normal.  Nursing note and vitals reviewed.    ED Treatments / Results   DIAGNOSTIC STUDIES: Oxygen Saturation is 99% on RA, normal by my interpretation.   COORDINATION OF CARE: 11:30 PM-Discussed next steps with pt. Pt verbalized understanding and is agreeable with the plan.   Labs (all labs ordered are listed, but only abnormal results are displayed) Labs Reviewed  COMPREHENSIVE METABOLIC PANEL - Abnormal; Notable for the following:       Result Value   Glucose, Bld 101 (*)    BUN 37 (*)    Calcium 8.8 (*)    Albumin 3.0 (*)    All other components within normal limits  CBC WITH DIFFERENTIAL/PLATELET - Abnormal; Notable for the following:    Hemoglobin 11.6 (*)    HCT 35.8 (*)    All other components within normal limits  CULTURE, BLOOD (ROUTINE X 2)  CULTURE, BLOOD (ROUTINE X 2)  I-STAT CG4 LACTIC ACID, ED  I-STAT CG4 LACTIC ACID, ED    EKG  EKG Interpretation None       Radiology Dg Knee Complete 4 Views Right  Result Date: 08/19/2016 CLINICAL DATA:  Acute onset of right knee erythema and pain, with large wound and exudate. Initial encounter. EXAM: RIGHT KNEE - COMPLETE 4+ VIEW COMPARISON:  None. FINDINGS: There is no evidence of fracture or dislocation. No osseous erosion is seen. The joint spaces are preserved. No significant degenerative change is seen; the patellofemoral joint is grossly unremarkable in appearance. No significant joint effusion is seen. A soft  tissue wound is noted anterolateral to the lateral femoral condyle, near the patella. No radiopaque foreign bodies are seen. IMPRESSION: 1. No evidence of fracture or dislocation. 2. No osseous erosions seen. 3. Soft tissue wound anterolateral to the lateral femoral condyle, near the patella. No radiopaque foreign bodies seen. Electronically Signed   By: Roanna Raider M.D.   On: 08/19/2016 01:09    Procedures Procedures (including critical care time)  Medications Ordered in ED Medications  vancomycin (VANCOCIN) IVPB 1000 mg/200 mL premix (0 mg Intravenous Stopped 08/19/16 0237)  oxyCODONE (Oxy IR/ROXICODONE) immediate release tablet 5 mg (5 mg Oral Given 08/19/16 0138)     Initial Impression / Assessment and Plan / ED Course  I have reviewed the triage vital signs and the nursing notes.  Pertinent labs & imaging results that were available during my care  of the patient were reviewed by me and considered in my medical decision making (see chart for details).      Patient Vitals for the past 24 hrs:  BP Temp Temp src Pulse Resp SpO2 Height Weight  08/19/16 0200 126/82 - - 93 16 95 % - -  08/19/16 0100 112/75 - - 98 14 100 % - -  08/19/16 0001 119/75 - - 99 16 97 % - -  08/18/16 2326 114/72 - - 96 16 99 % - -  08/18/16 2220 - - - - - - 5\' 11"  (1.803 m) 77.1 kg (170 lb)  08/18/16 2216 116/80 98.6 F (37 C) Oral (!) 105 20 93 % - -    At D/C Reevaluation with update and discussion. After initial assessment and treatment, an updated evaluation reveals he remains comfortable has no further complaints.  Findings discussed with patient and all questions answered. Jazman Reuter L    Final Clinical Impressions(s) / ED Diagnoses   Final diagnoses:  Cellulitis of right lower extremity   Right leg cellulitis secondary to open wound.  No evidence for osteomyelitis, sepsis, or metabolic instability.  Nursing Notes Reviewed/ Care Coordinated Applicable Imaging Reviewed Interpretation of  Laboratory Data incorporated into ED treatment  The patient appears reasonably screened and/or stabilized for discharge and I doubt any other medical condition or other [facility] requiring further screening, evaluation, or treatment in the ED at this time prior to discharge.  Plan: Home Medications-ibuprofen as needed; Home Treatments-wound care with warm soaks or compresses 3 times daily.; return here if the recommended treatment, does not improve the symptoms; Recommended follow up-PCP of choice in 1 week sooner if needed.   New Prescriptions Discharge Medication List as of 08/19/2016  1:34 AM    START taking these medications   Details  cephALEXin (KEFLEX) 500 MG capsule Take 1 capsule (500 mg total) by mouth 4 (four) times daily., Starting Sun 08/19/2016, Print    oxyCODONE (ROXICODONE) 5 MG immediate release tablet Take 1 tablet (5 mg total) by mouth every 4 (four) hours as needed for severe pain., Starting Sun 08/19/2016, Print    sulfamethoxazole-trimethoprim (BACTRIM DS,SEPTRA DS) 800-160 MG tablet Take 1 tablet by mouth 2 (two) times daily., Starting Sun 08/19/2016, Until Sun 08/26/2016, Print       I personally performed the services described in this documentation, which was scribed in my presence. The recorded information has been reviewed and is accurate.       Mancel BaleWentz, Paymon Rosensteel, MD 08/19/16 [phone]

## 2016-08-19 ENCOUNTER — Emergency Department (HOSPITAL_COMMUNITY): Payer: Self-pay

## 2016-08-19 LAB — CBC WITH DIFFERENTIAL/PLATELET
Basophils Absolute: 0 10*3/uL (ref 0.0–0.1)
Basophils Relative: 0 %
EOS PCT: 1 %
Eosinophils Absolute: 0.1 10*3/uL (ref 0.0–0.7)
HEMATOCRIT: 35.8 % — AB (ref 39.0–52.0)
Hemoglobin: 11.6 g/dL — ABNORMAL LOW (ref 13.0–17.0)
LYMPHS ABS: 1.2 10*3/uL (ref 0.7–4.0)
LYMPHS PCT: 12 %
MCH: 27.4 pg (ref 26.0–34.0)
MCHC: 32.4 g/dL (ref 30.0–36.0)
MCV: 84.4 fL (ref 78.0–100.0)
MONO ABS: 1 10*3/uL (ref 0.1–1.0)
Monocytes Relative: 10 %
Neutro Abs: 7 10*3/uL (ref 1.7–7.7)
Neutrophils Relative %: 77 %
PLATELETS: 169 10*3/uL (ref 150–400)
RBC: 4.24 MIL/uL (ref 4.22–5.81)
RDW: 15.4 % (ref 11.5–15.5)
WBC: 9.3 10*3/uL (ref 4.0–10.5)

## 2016-08-19 LAB — COMPREHENSIVE METABOLIC PANEL
ALT: 20 U/L (ref 17–63)
AST: 27 U/L (ref 15–41)
Albumin: 3 g/dL — ABNORMAL LOW (ref 3.5–5.0)
Alkaline Phosphatase: 84 U/L (ref 38–126)
Anion gap: 9 (ref 5–15)
BILIRUBIN TOTAL: 0.4 mg/dL (ref 0.3–1.2)
BUN: 37 mg/dL — AB (ref 6–20)
CHLORIDE: 105 mmol/L (ref 101–111)
CO2: 25 mmol/L (ref 22–32)
Calcium: 8.8 mg/dL — ABNORMAL LOW (ref 8.9–10.3)
Creatinine, Ser: 1.21 mg/dL (ref 0.61–1.24)
GFR calc Af Amer: >60 mL/min (ref >60)
GFR calc non Af Amer: >60 mL/min (ref >60)
GLUCOSE: 101 mg/dL — AB (ref 65–99)
POTASSIUM: 4.3 mmol/L (ref 3.5–5.1)
SODIUM: 139 mmol/L (ref 135–145)
Total Protein: 7.4 g/dL (ref 6.5–8.1)

## 2016-08-19 LAB — I-STAT CG4 LACTIC ACID, ED: LACTIC ACID, VENOUS: 1.88 mmol/L (ref 0.5–1.9)

## 2016-08-19 MED ORDER — CEPHALEXIN 500 MG PO CAPS: 500.0000 mg | ORAL_CAPSULE | Freq: Four times a day (QID) | ORAL | 0 refills | Status: DC

## 2016-08-19 MED ORDER — OXYCODONE HCL 5 MG PO TABS: 5.0000 mg | ORAL_TABLET | ORAL | 0 refills | Status: DC | PRN

## 2016-08-19 MED ORDER — SULFAMETHOXAZOLE-TRIMETHOPRIM 800-160 MG PO TABS: 1.0000 | ORAL_TABLET | Freq: Two times a day (BID) | ORAL | 0 refills | Status: AC

## 2016-08-19 MED ORDER — OXYCODONE HCL 5 MG PO TABS
5.0000 mg | ORAL_TABLET | Freq: Once | ORAL | Status: AC
  Administered 2016-08-19: 5 mg via ORAL
  Filled 2016-08-19: qty 1

## 2016-08-19 NOTE — ED Notes (Signed)
Multiple unsuccessful attempts at blood draw by staff members.

## 2016-08-19 NOTE — Discharge Instructions (Signed)
Start taking the antibiotic prescriptions, in the morning.  Take them until they are gone.  Soak in a warm tub, for 30 minutes, and clean the wound on your right leg well with soap and water 3 times a day.  Return here, for a checkup, in 2 days, sooner if needed for problems.

## 2016-08-24 LAB — CULTURE, BLOOD (ROUTINE X 2)
CULTURE: NO GROWTH
CULTURE: NO GROWTH
SPECIAL REQUESTS: ADEQUATE
Special Requests: ADEQUATE

## 2018-12-12 ENCOUNTER — Other Ambulatory Visit: Payer: Self-pay

## 2018-12-12 ENCOUNTER — Emergency Department (HOSPITAL_COMMUNITY)

## 2018-12-12 ENCOUNTER — Inpatient Hospital Stay (HOSPITAL_COMMUNITY)
Admission: EM | Admit: 2018-12-12 | Discharge: 2018-12-30 | DRG: 028 | Disposition: A | Discharge status: Home / Self Care | Attending: Family Medicine | Admitting: Family Medicine

## 2018-12-12 ENCOUNTER — Encounter (HOSPITAL_COMMUNITY): Payer: Self-pay

## 2018-12-12 DIAGNOSIS — Z20828 Contact with and (suspected) exposure to other viral communicable diseases: Secondary | ICD-10-CM | POA: Diagnosis present

## 2018-12-12 DIAGNOSIS — M4802 Spinal stenosis, cervical region: Secondary | ICD-10-CM | POA: Diagnosis present

## 2018-12-12 DIAGNOSIS — I10 Essential (primary) hypertension: Secondary | ICD-10-CM | POA: Diagnosis present

## 2018-12-12 DIAGNOSIS — F1410 Cocaine abuse, uncomplicated: Secondary | ICD-10-CM | POA: Diagnosis present

## 2018-12-12 DIAGNOSIS — R030 Elevated blood-pressure reading, without diagnosis of hypertension: Secondary | ICD-10-CM | POA: Diagnosis not present

## 2018-12-12 DIAGNOSIS — G039 Meningitis, unspecified: Secondary | ICD-10-CM

## 2018-12-12 DIAGNOSIS — M542 Cervicalgia: Secondary | ICD-10-CM | POA: Diagnosis present

## 2018-12-12 DIAGNOSIS — Z681 Body mass index (BMI) 19 or less, adult: Secondary | ICD-10-CM

## 2018-12-12 DIAGNOSIS — G030 Nonpyogenic meningitis: Principal | ICD-10-CM | POA: Insufficient documentation

## 2018-12-12 DIAGNOSIS — Z886 Allergy status to analgesic agent status: Secondary | ICD-10-CM

## 2018-12-12 DIAGNOSIS — A4153 Sepsis due to Serratia: Secondary | ICD-10-CM | POA: Diagnosis not present

## 2018-12-12 DIAGNOSIS — D696 Thrombocytopenia, unspecified: Secondary | ICD-10-CM | POA: Diagnosis present

## 2018-12-12 DIAGNOSIS — B9689 Other specified bacterial agents as the cause of diseases classified elsewhere: Secondary | ICD-10-CM | POA: Diagnosis present

## 2018-12-12 DIAGNOSIS — M4622 Osteomyelitis of vertebra, cervical region: Secondary | ICD-10-CM | POA: Diagnosis present

## 2018-12-12 DIAGNOSIS — G062 Extradural and subdural abscess, unspecified: Secondary | ICD-10-CM | POA: Diagnosis present

## 2018-12-12 DIAGNOSIS — F17210 Nicotine dependence, cigarettes, uncomplicated: Secondary | ICD-10-CM | POA: Diagnosis present

## 2018-12-12 DIAGNOSIS — Z981 Arthrodesis status: Secondary | ICD-10-CM | POA: Diagnosis not present

## 2018-12-12 DIAGNOSIS — G8929 Other chronic pain: Secondary | ICD-10-CM | POA: Diagnosis present

## 2018-12-12 DIAGNOSIS — M4642 Discitis, unspecified, cervical region: Secondary | ICD-10-CM | POA: Diagnosis present

## 2018-12-12 DIAGNOSIS — M4620 Osteomyelitis of vertebra, site unspecified: Secondary | ICD-10-CM | POA: Diagnosis not present

## 2018-12-12 DIAGNOSIS — G061 Intraspinal abscess and granuloma: Secondary | ICD-10-CM | POA: Diagnosis not present

## 2018-12-12 DIAGNOSIS — F419 Anxiety disorder, unspecified: Secondary | ICD-10-CM | POA: Diagnosis present

## 2018-12-12 DIAGNOSIS — Z419 Encounter for procedure for purposes other than remedying health state, unspecified: Secondary | ICD-10-CM

## 2018-12-12 DIAGNOSIS — M549 Dorsalgia, unspecified: Secondary | ICD-10-CM | POA: Diagnosis not present

## 2018-12-12 DIAGNOSIS — F1910 Other psychoactive substance abuse, uncomplicated: Secondary | ICD-10-CM | POA: Diagnosis not present

## 2018-12-12 DIAGNOSIS — Z888 Allergy status to other drugs, medicaments and biological substances status: Secondary | ICD-10-CM | POA: Diagnosis not present

## 2018-12-12 DIAGNOSIS — R7881 Bacteremia: Secondary | ICD-10-CM | POA: Diagnosis present

## 2018-12-12 DIAGNOSIS — E440 Moderate protein-calorie malnutrition: Secondary | ICD-10-CM | POA: Diagnosis present

## 2018-12-12 DIAGNOSIS — R000 Tachycardia, unspecified: Secondary | ICD-10-CM | POA: Diagnosis present

## 2018-12-12 LAB — CSF CELL COUNT WITH DIFFERENTIAL
Lymphs, CSF: 75 % (ref 40–80)
Lymphs, CSF: 82 % — ABNORMAL HIGH (ref 40–80)
Monocyte-Macrophage-Spinal Fluid: 2 % — ABNORMAL LOW (ref 15–45)
Monocyte-Macrophage-Spinal Fluid: 3 % — ABNORMAL LOW (ref 15–45)
RBC Count, CSF: 12 /mm3 — ABNORMAL HIGH
RBC Count, CSF: 630 /mm3 — ABNORMAL HIGH
Segmented Neutrophils-CSF: 15 % — ABNORMAL HIGH (ref 0–6)
Segmented Neutrophils-CSF: 23 % — ABNORMAL HIGH (ref 0–6)
Tube #: 1
Tube #: 4
WBC, CSF: 16 /mm3 (ref 0–5)
WBC, CSF: 20 /mm3 (ref 0–5)

## 2018-12-12 LAB — CBC WITH DIFFERENTIAL/PLATELET
Abs Immature Granulocytes: 0.03 10*3/uL (ref 0.00–0.07)
Basophils Absolute: 0 10*3/uL (ref 0.0–0.1)
Basophils Relative: 0 %
Eosinophils Absolute: 0.1 10*3/uL (ref 0.0–0.5)
Eosinophils Relative: 1 %
HCT: 35.4 % — ABNORMAL LOW (ref 39.0–52.0)
Hemoglobin: 11 g/dL — ABNORMAL LOW (ref 13.0–17.0)
Immature Granulocytes: 0 %
Lymphocytes Relative: 21 %
Lymphs Abs: 1.5 10*3/uL (ref 0.7–4.0)
MCH: 25.1 pg — ABNORMAL LOW (ref 26.0–34.0)
MCHC: 31.1 g/dL (ref 30.0–36.0)
MCV: 80.8 fL (ref 80.0–100.0)
Monocytes Absolute: 0.9 10*3/uL (ref 0.1–1.0)
Monocytes Relative: 13 %
Neutro Abs: 4.6 10*3/uL (ref 1.7–7.7)
Neutrophils Relative %: 65 %
Platelets: 140 10*3/uL — ABNORMAL LOW (ref 150–400)
RBC: 4.38 MIL/uL (ref 4.22–5.81)
RDW: 19.1 % — ABNORMAL HIGH (ref 11.5–15.5)
WBC: 7.1 10*3/uL (ref 4.0–10.5)
nRBC: 0 % (ref 0.0–0.2)

## 2018-12-12 LAB — RAPID URINE DRUG SCREEN, HOSP PERFORMED
Amphetamines: NOT DETECTED
Barbiturates: NOT DETECTED
Benzodiazepines: NOT DETECTED
Cocaine: NOT DETECTED
Opiates: NOT DETECTED
Tetrahydrocannabinol: NOT DETECTED

## 2018-12-12 LAB — URINALYSIS, ROUTINE W REFLEX MICROSCOPIC
Bilirubin Urine: NEGATIVE
Glucose, UA: NEGATIVE mg/dL
Ketones, ur: NEGATIVE mg/dL
Leukocytes,Ua: NEGATIVE
Nitrite: NEGATIVE
Protein, ur: NEGATIVE mg/dL
Specific Gravity, Urine: 1.012 (ref 1.005–1.030)
pH: 6 (ref 5.0–8.0)

## 2018-12-12 LAB — BASIC METABOLIC PANEL
Anion gap: 10 (ref 5–15)
BUN: 7 mg/dL (ref 6–20)
CO2: 25 mmol/L (ref 22–32)
Calcium: 9.2 mg/dL (ref 8.9–10.3)
Chloride: 104 mmol/L (ref 98–111)
Creatinine, Ser: 0.54 mg/dL — ABNORMAL LOW (ref 0.61–1.24)
GFR calc Af Amer: >60 mL/min (ref >60)
GFR calc non Af Amer: >60 mL/min (ref >60)
Glucose, Bld: 82 mg/dL (ref 70–99)
Potassium: 3.5 mmol/L (ref 3.5–5.1)
Sodium: 139 mmol/L (ref 135–145)

## 2018-12-12 LAB — CRYPTOCOCCAL ANTIGEN, CSF: Crypto Ag: NEGATIVE

## 2018-12-12 LAB — SARS CORONAVIRUS 2 (TAT 6-24 HRS): SARS Coronavirus 2: NEGATIVE

## 2018-12-12 LAB — PATHOLOGIST SMEAR REVIEW

## 2018-12-12 LAB — RAPID HIV SCREEN (HIV 1/2 AB+AG)
HIV 1/2 Antibodies: NONREACTIVE
HIV-1 P24 Antigen - HIV24: NONREACTIVE

## 2018-12-12 LAB — LACTIC ACID, PLASMA: Lactic Acid, Venous: 0.9 mmol/L (ref 0.5–1.9)

## 2018-12-12 LAB — GLUCOSE, CSF: Glucose, CSF: 39 mg/dL — ABNORMAL LOW (ref 40–70)

## 2018-12-12 LAB — PROTEIN, CSF: Total  Protein, CSF: 196 mg/dL — ABNORMAL HIGH (ref 15–45)

## 2018-12-12 MED ORDER — ACETAMINOPHEN 325 MG PO TABS: 650.0000 mg | ORAL_TABLET | Freq: Four times a day (QID) | ORAL | Status: DC | PRN

## 2018-12-12 MED ORDER — LORAZEPAM 2 MG/ML IJ SOLN
0.5000 mg | Freq: Once | INTRAMUSCULAR | Status: AC
  Administered 2018-12-12: 0.5 mg via INTRAVENOUS
  Filled 2018-12-12: qty 1

## 2018-12-12 MED ORDER — SODIUM CHLORIDE 0.9 % IV SOLN
INTRAVENOUS | Status: DC
  Administered 2018-12-12: 22:00:00 via INTRAVENOUS

## 2018-12-12 MED ORDER — ZOLPIDEM TARTRATE 5 MG PO TABS: 5.0000 mg | ORAL_TABLET | Freq: Every evening | ORAL | Status: DC | PRN

## 2018-12-12 MED ORDER — CLONIDINE HCL 0.1 MG PO TABS
0.1000 mg | ORAL_TABLET | Freq: Every day | ORAL | Status: DC
  Administered 2018-12-12 – 2018-12-13 (×2): 0.1 mg via ORAL
  Filled 2018-12-12 (×2): qty 1

## 2018-12-12 MED ORDER — DEXTROSE 5 % IV SOLN
10.0000 mg/kg | Freq: Three times a day (TID) | INTRAVENOUS | Status: DC
  Administered 2018-12-12 – 2018-12-13 (×2): 635 mg via INTRAVENOUS
  Filled 2018-12-12 (×5): qty 12.7

## 2018-12-12 MED ORDER — IBUPROFEN 200 MG PO TABS
400.0000 mg | ORAL_TABLET | Freq: Once | ORAL | Status: AC
  Administered 2018-12-12: 400 mg via ORAL
  Filled 2018-12-12: qty 2

## 2018-12-12 MED ORDER — LORAZEPAM 2 MG/ML IJ SOLN
1.0000 mg | Freq: Once | INTRAMUSCULAR | Status: AC
  Administered 2018-12-12: 1 mg via INTRAVENOUS
  Filled 2018-12-12: qty 1

## 2018-12-12 MED ORDER — DEXAMETHASONE SODIUM PHOSPHATE 10 MG/ML IJ SOLN
10.0000 mg | Freq: Once | INTRAMUSCULAR | Status: AC
  Administered 2018-12-12: 10 mg via INTRAVENOUS
  Filled 2018-12-12: qty 1

## 2018-12-12 MED ORDER — SODIUM CHLORIDE 0.9 % IV SOLN
2.0000 g | Freq: Once | INTRAVENOUS | Status: AC
  Administered 2018-12-12: 2 g via INTRAVENOUS
  Filled 2018-12-12: qty 20

## 2018-12-12 MED ORDER — SODIUM CHLORIDE 0.9 % IV SOLN
INTRAVENOUS | Status: DC
  Administered 2018-12-12 (×2): via INTRAVENOUS

## 2018-12-12 MED ORDER — SENNOSIDES-DOCUSATE SODIUM 8.6-50 MG PO TABS: 1.0000 | ORAL_TABLET | Freq: Every evening | ORAL | Status: DC | PRN

## 2018-12-12 MED ORDER — ONDANSETRON HCL 4 MG/2ML IJ SOLN
4.0000 mg | Freq: Four times a day (QID) | INTRAMUSCULAR | Status: DC | PRN
  Administered 2018-12-14: 4 mg via INTRAVENOUS

## 2018-12-12 MED ORDER — ENOXAPARIN SODIUM 40 MG/0.4ML ~~LOC~~ SOLN
40.0000 mg | SUBCUTANEOUS | Status: DC
  Administered 2018-12-12: 40 mg via SUBCUTANEOUS
  Filled 2018-12-12 (×3): qty 0.4

## 2018-12-12 MED ORDER — SODIUM CHLORIDE 0.9 % IV SOLN
2.0000 g | Freq: Two times a day (BID) | INTRAVENOUS | Status: DC
  Administered 2018-12-13: 2 g via INTRAVENOUS
  Filled 2018-12-12: qty 2

## 2018-12-12 MED ORDER — HYDROMORPHONE HCL 1 MG/ML IJ SOLN
1.0000 mg | Freq: Once | INTRAMUSCULAR | Status: AC
  Administered 2018-12-12: 1 mg via INTRAVENOUS
  Filled 2018-12-12: qty 1

## 2018-12-12 MED ORDER — LABETALOL HCL 5 MG/ML IV SOLN
5.0000 mg | INTRAVENOUS | Status: DC | PRN
  Administered 2018-12-12 – 2018-12-14 (×4): 5 mg via INTRAVENOUS
  Filled 2018-12-12 (×6): qty 4

## 2018-12-12 MED ORDER — VANCOMYCIN HCL IN DEXTROSE 750-5 MG/150ML-% IV SOLN
750.0000 mg | Freq: Three times a day (TID) | INTRAVENOUS | Status: DC
  Administered 2018-12-13: 750 mg via INTRAVENOUS
  Filled 2018-12-12 (×3): qty 150

## 2018-12-12 MED ORDER — METHOCARBAMOL 500 MG PO TABS
500.0000 mg | ORAL_TABLET | Freq: Three times a day (TID) | ORAL | Status: DC
  Administered 2018-12-12 – 2018-12-13 (×3): 500 mg via ORAL
  Filled 2018-12-12 (×3): qty 1

## 2018-12-12 MED ORDER — DEXTROSE 5 % IV SOLN
10.0000 mg/kg | Freq: Once | INTRAVENOUS | Status: AC
  Administered 2018-12-12: 635 mg via INTRAVENOUS
  Filled 2018-12-12: qty 12.7

## 2018-12-12 MED ORDER — VANCOMYCIN HCL 10 G IV SOLR
1250.0000 mg | Freq: Once | INTRAVENOUS | Status: AC
  Administered 2018-12-12: 1250 mg via INTRAVENOUS
  Filled 2018-12-12: qty 1250

## 2018-12-12 MED ORDER — VANCOMYCIN HCL IN DEXTROSE 1-5 GM/200ML-% IV SOLN: 1000.0000 mg | Freq: Once | INTRAVENOUS | Status: DC

## 2018-12-12 MED ORDER — KETOROLAC TROMETHAMINE 15 MG/ML IJ SOLN
15.0000 mg | Freq: Once | INTRAMUSCULAR | Status: AC
  Administered 2018-12-12: 15 mg via INTRAVENOUS
  Filled 2018-12-12: qty 1

## 2018-12-12 MED ORDER — HYDROMORPHONE HCL 1 MG/ML IJ SOLN
0.5000 mg | Freq: Once | INTRAMUSCULAR | Status: AC
  Administered 2018-12-12: 0.5 mg via INTRAVENOUS
  Filled 2018-12-12: qty 1

## 2018-12-12 MED ORDER — ONDANSETRON HCL 4 MG PO TABS: 4.0000 mg | ORAL_TABLET | Freq: Four times a day (QID) | ORAL | Status: DC | PRN

## 2018-12-12 MED ORDER — ACETAMINOPHEN 650 MG RE SUPP: 650.0000 mg | Freq: Four times a day (QID) | RECTAL | Status: DC | PRN

## 2018-12-12 NOTE — ED Notes (Signed)
Critical WBC results on CSF  given to Dr. Wilson Singer.

## 2018-12-12 NOTE — Progress Notes (Signed)
Pharmacy Antibiotic Note  David Short is a 48 y.o. male admitted on 12/12/2018 with meningitis.  Pharmacy has been consulted for vancomycin and acyclovir dosing.  Plan: Ceftriaxone 2 gm IV q12 Vancomycin 1250 mg given in ED at 1509 then vancomycin 750 mg IV q8h Acyclovir 10 mg/kg IV q8h = 635 mg IV q8h F/u renal fxn, WBC, temp, culture data Vancomycin levels as needed  Height: 5' 10.5" (179.1 cm) Weight: 140 lb (63.5 kg) IBW/kg (Calculated) : 74.15  Temp (24hrs), Avg:98.3 F (36.8 C), Min:98.3 F (36.8 C), Max:98.3 F (36.8 C)  Recent Labs  Lab 12/12/18 0920  WBC 7.1  CREATININE 0.54*  LATICACIDVEN 0.9    Estimated Creatinine Clearance: 100.3 mL/min (A) (by C-G formula based on SCr of 0.54 mg/dL (L)).    Allergies  Allergen Reactions  . Tylenol [Acetaminophen] Hives    Antimicrobials this admission: 9/25 CTX> 9/25 Vanc>> 9/25 acyclovir> Dose adjustments this admission:  Microbiology results: 9/25 HIV neg 9/25 crypto Ag neg 9/25 CSF: sent 9/25 BCx2: sent  Thank you for allowing pharmacy to be a part of this patient's care.  Eudelia Bunch, Pharm.D [phone] 12/12/2018 5:41 PM

## 2018-12-12 NOTE — ED Provider Notes (Signed)
[place] DEPT Provider Note   CSN: [id number] Arrival date & time: 12/12/18  0808     History   Chief Complaint Chief Complaint  Patient presents with  . Neck Pain  . Fever    HPI David Short is a 48 y.o. male.     HPI   48yM with atraumatic neck pain. First noticed when at rest this Sunday. Persistent since. Worse with any type of movement.  Radiates into shoulders/upper back also has a posterior headache. Intermittent fevers since last week and moved from general population to medical ward in prison. Today prison nurse was concerned about what she felt was nuchal rigidity and thought he needed evaluation in the ER. Aside from orthopedic injuries related to a fall years ago, he denies significant PMHx. Said he had some fractures in his back related to this but has never had back or neck surgery.   No past medical history on file.   Past Surgical History:  Procedure Laterality Date  . BACK SURGERY     states never had surgery  . FRACTURE SURGERY Left    elbow/wrist/ankle     Home Medications    Prior to Admission medications   Medication Sig Start Date End Date Taking? Authorizing Provider  cephALEXin (KEFLEX) 500 MG capsule Take 1 capsule (500 mg total) by mouth 4 (four) times daily. 08/19/16   Daleen Bo, MD  ibuprofen (ADVIL,MOTRIN) 200 MG tablet Take 400 mg by mouth every 6 (six) hours as needed for headache, mild pain or moderate pain.    [provider]  ibuprofen (ADVIL,MOTRIN) 800 MG tablet Take 1 tablet (800 mg total) by mouth 3 (three) times daily. Patient not taking: Reported on 08/18/2016 03/17/12   Schinlever, Barnetta Chapel, PA-C  oxyCODONE (ROXICODONE) 5 MG immediate release tablet Take 1 tablet (5 mg total) by mouth every 4 (four) hours as needed for severe pain. 08/19/16   Daleen Bo, MD  promethazine (PHENERGAN) 25 MG tablet Take 1 tablet (25 mg total) by mouth every 6 (six) hours as needed for nausea.  Patient not taking: Reported on 08/18/2016 03/17/12   Schinlever, Barnetta Chapel, PA-C   Family History No family history on file.  Social History Social History   Tobacco Use  . Smoking status: Current Every Day Smoker    Packs/day: 1.00    Types: Cigarettes  . Smokeless tobacco: Never Used  Substance Use Topics  . Alcohol use: No  . Drug use: Yes    Types: IV   Allergies   Tylenol [acetaminophen]  Review of Systems Review of Systems  All systems reviewed and negative, other than as noted in HPI.  Physical Exam Updated Vital Signs BP (!) 147/109 (BP Location: Left Arm)   Pulse 100   Temp 98.3 F (36.8 C) (Oral)   Resp 16   Ht 5' 10.5" (1.791 m)   Wt 63.5 kg   SpO2 96%   BMI 19.80 kg/m   Physical Exam Vitals signs and nursing note reviewed.  Constitutional:      General: He is not in acute distress.    Appearance: He is well-developed.  HENT:     Head: Normocephalic and atraumatic.  Eyes:     General:        Right eye: No discharge.        Left eye: No discharge.     Conjunctiva/sclera: Conjunctivae normal.  Neck:     Musculoskeletal: Neck rigidity present.     Comments: TTP diffusely  posterior neck and into upper trapezius. No overlying skin changes. Nuchal rigidity.  Cardiovascular:     Rate and Rhythm: Normal rate and regular rhythm.     Heart sounds: Normal heart sounds. No murmur. No friction rub. No gallop.   Pulmonary:     Effort: Pulmonary effort is normal. No respiratory distress.     Breath sounds: Normal breath sounds.  Abdominal:     General: There is no distension.     Palpations: Abdomen is soft.     Tenderness: There is no abdominal tenderness.  Skin:    General: Skin is warm and dry.  Neurological:     General: No focal deficit present.     Mental Status: He is alert and oriented to person, place, and time.     Cranial Nerves: No cranial nerve deficit.     Sensory: No sensory deficit.     Motor: No weakness.     Comments: Pain with  shoulder shrug but CN 2-12 intact. Strength 5/5 b/l U/L extremities. Sensation intact to light touch.   Psychiatric:        Behavior: Behavior normal.        Thought Content: Thought content normal.    ED Treatments / Results  Labs (all labs ordered are listed, but only abnormal results are displayed) Labs Reviewed  CBC WITH DIFFERENTIAL/PLATELET - Abnormal; Notable for the following components:      Result Value   Hemoglobin 11.0 (*)    HCT 35.4 (*)    MCH 25.1 (*)    RDW 19.1 (*)    Platelets 140 (*)    All other components within normal limits  BASIC METABOLIC PANEL - Abnormal; Notable for the following components:   Creatinine, Ser 0.54 (*)    All other components within normal limits  URINALYSIS, ROUTINE W REFLEX MICROSCOPIC - Abnormal; Notable for the following components:   Hgb urine dipstick SMALL (*)    Bacteria, UA RARE (*)    All other components within normal limits  CSF CELL COUNT WITH DIFFERENTIAL - Abnormal; Notable for the following components:   Color, CSF STRAW (*)    Appearance, CSF HAZY (*)    RBC Count, CSF 630 (*)    WBC, CSF 16 (*)    Segmented Neutrophils-CSF 15 (*)    Lymphs, CSF 82 (*)    Monocyte-Macrophage-Spinal Fluid 3 (*)    All other components within normal limits  CSF CELL COUNT WITH DIFFERENTIAL - Abnormal; Notable for the following components:   Appearance, CSF CLEAR (*)    RBC Count, CSF 12 (*)    WBC, CSF 20 (*)    Segmented Neutrophils-CSF 23 (*)    Monocyte-Macrophage-Spinal Fluid 2 (*)    All other components within normal limits  GLUCOSE, CSF - Abnormal; Notable for the following components:   Glucose, CSF 39 (*)    All other components within normal limits  PROTEIN, CSF - Abnormal; Notable for the following components:   Total  Protein, CSF 196 (*)    All other components within normal limits  CSF CULTURE  CULTURE, BLOOD (ROUTINE X 2)  CULTURE, BLOOD (ROUTINE X 2)  SARS CORONAVIRUS 2 (TAT 6-24 HRS)  LACTIC ACID, PLASMA   RAPID HIV SCREEN (HIV 1/2 AB+AG)  CRYPTOCOCCAL ANTIGEN, CSF  LACTIC ACID, PLASMA  PATHOLOGIST SMEAR REVIEW    EKG None  Radiology Dg Chest Portable 1 View  Result Date: 12/12/2018 CLINICAL DATA:  Fever EXAM: PORTABLE CHEST 1 VIEW COMPARISON:  November 29, 2012 FINDINGS: No edema consolidation. Heart size and pulmonary vascularity are normal. No adenopathy. No bone lesions. IMPRESSION: No edema or consolidation. Electronically Signed   By: Bretta BangWilliam  Woodruff III M.D.   On: 12/12/2018 10:18    Procedures .Lumbar Puncture  Date/Time: 12/12/2018 10:45 AM Performed by: Raeford RazorKohut, Arrin Ishler, MD Authorized by: Raeford RazorKohut, Denson Niccoli, MD   Consent:    Consent obtained:  Verbal   Consent given by:  Patient   Risks discussed:  Bleeding, pain, headache and nerve damage Pre-procedure details:    Procedure purpose:  Diagnostic   Preparation: Patient was prepped and draped in usual sterile fashion   Anesthesia (see MAR for exact dosages):    Anesthesia method:  Local infiltration   Local anesthetic:  Lidocaine 1% w/o epi Procedure details:    Lumbar space:  L4-L5 interspace   Patient position:  R lateral decubitus   Needle gauge:  20   Needle type:  Spinal needle - Quincke tip   Needle length (in):  3.5   Ultrasound guidance: no     Number of attempts:  2 (First attempt by mid-level provider under my supervision. Second attempt by myself.)   Fluid appearance:  Blood-tinged then clearing   Tubes of fluid:  4   Total volume (ml):  5 Post-procedure:    Puncture site:  Adhesive bandage applied   Patient tolerance of procedure:  Tolerated well, no immediate complications   (including critical care time)  Medications Ordered in ED Medications  0.9 %  sodium chloride infusion ( Intravenous New Bag/Given 12/12/18 0950)  cefTRIAXone (ROCEPHIN) 2 g in sodium chloride 0.9 % 100 mL IVPB (has no administration in time range)  vancomycin (VANCOCIN) 1,250 mg in sodium chloride 0.9 % 250 mL IVPB (has no  administration in time range)  dexamethasone (DECADRON) injection 10 mg (has no administration in time range)  acyclovir (ZOVIRAX) 635 mg in dextrose 5 % 100 mL IVPB (has no administration in time range)  HYDROmorphone (DILAUDID) injection 1 mg (has no administration in time range)  LORazepam (ATIVAN) injection 0.5 mg (has no administration in time range)  ketorolac (TORADOL) 15 MG/ML injection 15 mg (15 mg Intravenous Given 12/12/18 0940)  HYDROmorphone (DILAUDID) injection 0.5 mg (0.5 mg Intravenous Given 12/12/18 0945)  LORazepam (ATIVAN) injection 1 mg (1 mg Intravenous Given 12/12/18 0942)    Initial Impression / Assessment and Plan / ED Course  I have reviewed the triage vital signs and the nursing notes.  Pertinent labs & imaging results that were available during my care of the patient were reviewed by me and considered in my medical decision making (see chart for details).  48yM with atraumatic neck pain. I agree that he does has nuchal rigidity. Reported fevers. Afebrile in ED. Says they have been giving him ibuprofen though. Unclear when last dose. Nontoxic in appearance. Consider cervical radiculopathy with radicular sounding pain into UE. Not convincing though. Neuro exam is nonfocal. Will LP. May need imaging if non-diagnostic.   CSF abnormal appearance. Was a bloody tap that cleared to normal appearance of CSF but there was also a tiny ribbon of white material in third tube (similar in appearance to what you see in the water when you poach eggs).   CSF studies noted. Likely viral/aseptic meningitis. Will cover with abx until cultures resulted though.   Final Clinical Impressions(s) / ED Diagnoses   Final diagnoses:  Meningitis    ED Discharge Orders    None  Raeford Razor, MD 12/12/18 1430

## 2018-12-12 NOTE — H&P (Signed)
[facility] History and Physical   Patient: David Short KDT:[id number]   PCP: Default, Provider, MD DOB: [birth year]   DOA: 12/12/2018   DOS: 12/12/2018   DOS: the patient was seen and examined on 12/12/2018  Patient coming from: The patient is coming from Home  Chief Complaint: neck pain and back pain  HPI: David Short is a 48 y.o. male with Past medical history of back pain. Patient presents with complaints of fever and back pain as well as neck stiffness.  Neck pain has been ongoing for last 6 days.  Intermittent fever ostomy for last 3 days. Patient was initially placed on medical ward at the present. Due to complaints of worsening neck stiffness patient was brought to the emergency department for further work-up. At the time of my evaluation patient denies any photophobia or phonophobia.  No nausea or vomiting.  No chest pain abdominal pain.  No cough no shortness of breath.  No diarrhea no constipation. He multiple times repeated one thing that he has back pain and neck pain.  ED Course: Due to fever and neck stiffness patient underwent CSF analysis with LP which showed pleocytosis patient was referred for admission for concern for meningitis.  At his baseline without support Is independent for most of his ADL;  Does manages his medication on his own.  Review of Systems: as mentioned in the history of present illness.  All other systems reviewed and are negative.  History reviewed. No pertinent past medical history. Past Surgical History:  Procedure Laterality Date  . BACK SURGERY     states never had surgery  . FRACTURE SURGERY Left    elbow/wrist/ankle   Social History:  reports that he has been smoking cigarettes. He has been smoking about 1.00 pack per day. He has never used smokeless tobacco. 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 He reports that he does not drink alcohol.  Allergies  Allergen Reactions  . Tylenol [Acetaminophen] Hives     Family  history reviewed and not pertinent   Prior to Admission medications   Medication Sig Start Date End Date Taking? Authorizing Provider  cephALEXin (KEFLEX) 500 MG capsule Take 1 capsule (500 mg total) by mouth 4 (four) times daily. Patient not taking: Reported on 12/12/2018 08/19/16   Daleen Bo, MD  ibuprofen (ADVIL,MOTRIN) 800 MG tablet Take 1 tablet (800 mg total) by mouth 3 (three) times daily. Patient not taking: Reported on 08/18/2016 03/17/12   Schinlever, Barnetta Chapel, PA-C  oxyCODONE (ROXICODONE) 5 MG immediate release tablet Take 1 tablet (5 mg total) by mouth every 4 (four) hours as needed for severe pain. Patient not taking: Reported on 12/12/2018 08/19/16   Daleen Bo, MD  promethazine (PHENERGAN) 25 MG tablet Take 1 tablet (25 mg total) by mouth every 6 (six) hours as needed for nausea. Patient not taking: Reported on 08/18/2016 03/17/12   Gertha Calkin, PA-C    Physical Exam: Vitals:   12/12/18 1800 12/12/18 1830 12/12/18 1850 12/12/18 1900  BP: (!) 156/110 (!) 163/116 (!) 170/110 (!) 165/102  Pulse: (!) 131 (!) 124 (!) 132 (!) 127  Resp: (!) 27 (!) 26 20 (!) 24  Temp:      TempSrc:      SpO2: 92% 93% 95% 93%  Weight:      Height:        General: alert and oriented to time, place, and person. Appear in mild distress, affect anxious Eyes: PERRL, Conjunctiva normal ENT: Oral Mucosa Clear, moist  Neck: no JVD, no Abnormal Mass Or lumps Cardiovascular: S1 and S2 Present, no Murmur, peripheral pulses symmetrical Respiratory: good respiratory effort, Bilateral Air entry equal and Decreased, no signs of accessory muscle use, Clear to Auscultation, no Crackles, no wheezes Abdomen: Bowel Sound present, Soft and no tenderness, no hernia Skin: no rashes  Extremities: no Pedal edema, no calf tenderness Neurologic: without any new focal findings, PERLA, Motor strength 5/5 and symmetric and neck stiffness, no photophobia Gait not checked due to patient safety concerns  Data  Reviewed: I have personally reviewed and interpreted labs, imaging as discussed below.  CBC: Recent Labs  Lab 12/12/18 0920  WBC 7.1  NEUTROABS 4.6  HGB 11.0*  HCT 35.4*  MCV 80.8  PLT 140*   Basic Metabolic Panel: Recent Labs  Lab 12/12/18 0920  NA 139  K 3.5  CL 104  CO2 25  GLUCOSE 82  BUN 7  CREATININE 0.54*  CALCIUM 9.2   GFR: Estimated Creatinine Clearance: 100.3 mL/min (A) (by C-G formula based on SCr of 0.54 mg/dL (L)). Liver Function Tests: No results for input(s): AST, ALT, ALKPHOS, BILITOT, PROT, ALBUMIN in the last 168 hours. No results for input(s): LIPASE, AMYLASE in the last 168 hours. No results for input(s): AMMONIA in the last 168 hours. Coagulation Profile: No results for input(s): INR, PROTIME in the last 168 hours. Cardiac Enzymes: No results for input(s): CKTOTAL, CKMB, CKMBINDEX, TROPONINI in the last 168 hours. BNP (last 3 results) No results for input(s): PROBNP in the last 8760 hours. HbA1C: No results for input(s): HGBA1C in the last 72 hours. CBG: No results for input(s): GLUCAP in the last 168 hours. Lipid Profile: No results for input(s): CHOL, HDL, LDLCALC, TRIG, CHOLHDL, LDLDIRECT in the last 72 hours. Thyroid Function Tests: No results for input(s): TSH, T4TOTAL, FREET4, T3FREE, THYROIDAB in the last 72 hours. Anemia Panel: No results for input(s): VITAMINB12, FOLATE, FERRITIN, TIBC, IRON, RETICCTPCT in the last 72 hours. Urine analysis:    Component Value Date/Time   COLORURINE YELLOW 12/12/2018 1221   APPEARANCEUR CLEAR 12/12/2018 1221   LABSPEC 1.012 12/12/2018 1221   PHURINE 6.0 12/12/2018 1221   GLUCOSEU NEGATIVE 12/12/2018 1221   HGBUR SMALL (A) 12/12/2018 1221   BILIRUBINUR NEGATIVE 12/12/2018 1221   KETONESUR NEGATIVE 12/12/2018 1221   PROTEINUR NEGATIVE 12/12/2018 1221   UROBILINOGEN 1.0 03/17/2012 0047   NITRITE NEGATIVE 12/12/2018 1221   LEUKOCYTESUR NEGATIVE 12/12/2018 1221    Radiological Exams on  Admission: Dg Chest Portable 1 View  Result Date: 12/12/2018 CLINICAL DATA:  Fever EXAM: PORTABLE CHEST 1 VIEW COMPARISON:  November 29, 2012 FINDINGS: No edema consolidation. Heart size and pulmonary vascularity are normal. No adenopathy. No bone lesions. IMPRESSION: No edema or consolidation. Electronically Signed   By: Bretta BangWilliam  Woodruff III M.D.   On: 12/12/2018 10:18   I reviewed all nursing notes, pharmacy notes, vitals, pertinent old records.  Assessment/Plan 1. Aseptic meningitis Concern for meningitis. We will continue with IV antibiotics. Low threshold to de-escalate or stop the antibiotics depending on the cultures.  2.  Back pain Treat with Robaxin.  3.  Accelerated hypertension. Sinus tachycardia. Patient reports that he has significant anxiety and therefore his blood pressure is high. No prior history high blood pressure. We will treat with clonidine and labetalol. UDS negative for cocaine.  4.  Mild thrombocytopenia. Monitor.  Nutrition: Regular diet DVT Prophylaxis: Subcutaneous Lovenox  Advance goals of care discussion: Full code   Consults: none   Family Communication: no  family was present at bedside, at the time of interview.  Disposition: Admitted as inpatient, telemetry unit. Likely to be discharged home/jail, in 2 days.  I have discussed plan of care as described above with RN and patient/family.  Severity of Illness: The appropriate patient status for this patient is INPATIENT. Inpatient status is judged to be reasonable and necessary in order to provide the required intensity of service to ensure the patient's safety. The patient's presenting symptoms, physical exam findings, and initial radiographic and laboratory data in the context of their chronic comorbidities is felt to place them at high risk for further clinical deterioration. Furthermore, it is not anticipated that the patient will be medically stable for discharge from the hospital within 2  midnights of admission. The following factors support the patient status of inpatient.   " The patient's presenting symptoms include neck stiffness back pain. " The worrisome physical exam findings include neck stiffness, BP >200. " The initial radiographic and laboratory data are worrisome because of CSF pleocytosis.   * I certify that at the point of admission it is my clinical judgment that the patient will require inpatient hospital care spanning beyond 2 midnights from the point of admission due to high intensity of service, high risk for further deterioration and high frequency of surveillance required.*    Author: Lynden Oxford, MD [facility] 12/12/2018 7:19 PM   To reach On-call, see care teams to locate the attending and reach out to them via [email or web address]. If 7PM-7AM, please contact night-coverage If you still have difficulty reaching the attending provider, please page the [facility] (Director on Call) for [facility] on amion for assistance.

## 2018-12-12 NOTE — ED Notes (Signed)
Per Dr. Posey Pronto. Patient is appropriate for tele and that tele is primarily for BP.

## 2018-12-12 NOTE — Progress Notes (Signed)
   12/12/18 1942  MEWS Score  Resp (!) 26  Pulse Rate (!) 117  BP (!) 148/106  Temp 98.4 F (36.9 C)  SpO2 95 %  MEWS Score  MEWS RR 2  MEWS Pulse 2  MEWS Systolic 0  MEWS LOC 0  MEWS Temp 0  MEWS Score 4  MEWS Score Color Red  MEWS Assessment  Is this an acute change? No  Pt arrived [facility] w/ c/o pain 10/10. Pt was able to ambulate from hallway to bed w/o difficulty. VS remain elevated. PRN will be given and plan of care will be followed. RN spoke w/ RRN to verify pt condition. Will continue to monitor at this time.

## 2018-12-12 NOTE — ED Triage Notes (Signed)
Patient c/o neck pain x 6 days and intermittent fever x 3 days. Patient was brought in by [facility]. Patient has ankle shackles on.

## 2018-12-12 NOTE — Progress Notes (Signed)
Reached out to ED RN about addressing pts MEWS and documenting pts status.

## 2018-12-13 ENCOUNTER — Inpatient Hospital Stay (HOSPITAL_COMMUNITY)

## 2018-12-13 ENCOUNTER — Other Ambulatory Visit: Payer: Self-pay | Admitting: Internal Medicine

## 2018-12-13 LAB — COMPREHENSIVE METABOLIC PANEL
ALT: 15 U/L (ref 0–44)
AST: 14 U/L — ABNORMAL LOW (ref 15–41)
Albumin: 3.2 g/dL — ABNORMAL LOW (ref 3.5–5.0)
Alkaline Phosphatase: 76 U/L (ref 38–126)
Anion gap: 10 (ref 5–15)
BUN: 13 mg/dL (ref 6–20)
CO2: 21 mmol/L — ABNORMAL LOW (ref 22–32)
Calcium: 9.4 mg/dL (ref 8.9–10.3)
Chloride: 109 mmol/L (ref 98–111)
Creatinine, Ser: 0.45 mg/dL — ABNORMAL LOW (ref 0.61–1.24)
GFR calc Af Amer: >60 mL/min (ref >60)
GFR calc non Af Amer: >60 mL/min (ref >60)
Glucose, Bld: 175 mg/dL — ABNORMAL HIGH (ref 70–99)
Potassium: 4.4 mmol/L (ref 3.5–5.1)
Sodium: 140 mmol/L (ref 135–145)
Total Bilirubin: 0.3 mg/dL (ref 0.3–1.2)
Total Protein: 7.3 g/dL (ref 6.5–8.1)

## 2018-12-13 LAB — BLOOD CULTURE ID PANEL (REFLEXED)

## 2018-12-13 LAB — CBC WITH DIFFERENTIAL/PLATELET
Abs Immature Granulocytes: 0.03 10*3/uL (ref 0.00–0.07)
Basophils Absolute: 0 10*3/uL (ref 0.0–0.1)
Basophils Relative: 0 %
Eosinophils Absolute: 0 10*3/uL (ref 0.0–0.5)
Eosinophils Relative: 0 %
HCT: 34.4 % — ABNORMAL LOW (ref 39.0–52.0)
Hemoglobin: 10.7 g/dL — ABNORMAL LOW (ref 13.0–17.0)
Immature Granulocytes: 1 %
Lymphocytes Relative: 11 %
Lymphs Abs: 0.7 10*3/uL (ref 0.7–4.0)
MCH: 24.9 pg — ABNORMAL LOW (ref 26.0–34.0)
MCHC: 31.1 g/dL (ref 30.0–36.0)
MCV: 80 fL (ref 80.0–100.0)
Monocytes Absolute: 0.4 10*3/uL (ref 0.1–1.0)
Monocytes Relative: 6 %
Neutro Abs: 4.9 10*3/uL (ref 1.7–7.7)
Neutrophils Relative %: 82 %
Platelets: 166 10*3/uL (ref 150–400)
RBC: 4.3 MIL/uL (ref 4.22–5.81)
RDW: 18.6 % — ABNORMAL HIGH (ref 11.5–15.5)
WBC: 6 10*3/uL (ref 4.0–10.5)
nRBC: 0 % (ref 0.0–0.2)

## 2018-12-13 LAB — MRSA PCR SCREENING: MRSA by PCR: NEGATIVE

## 2018-12-13 MED ORDER — BUTALBITAL-APAP-CAFFEINE 50-325-40 MG PO TABS: 1.0000 | ORAL_TABLET | Freq: Four times a day (QID) | ORAL | Status: DC | PRN

## 2018-12-13 MED ORDER — DIPHENHYDRAMINE HCL 25 MG PO CAPS
25.0000 mg | ORAL_CAPSULE | Freq: Four times a day (QID) | ORAL | Status: DC | PRN
  Administered 2018-12-17 – 2018-12-23 (×3): 25 mg via ORAL
  Filled 2018-12-13 (×3): qty 1

## 2018-12-13 MED ORDER — SODIUM CHLORIDE 0.9 % IV SOLN
2.0000 g | INTRAVENOUS | Status: AC
  Administered 2018-12-13: 2 g via INTRAVENOUS
  Filled 2018-12-13: qty 2

## 2018-12-13 MED ORDER — SODIUM CHLORIDE 0.9 % IV SOLN
2.0000 g | Freq: Three times a day (TID) | INTRAVENOUS | Status: DC
  Administered 2018-12-13 – 2018-12-16 (×7): 2 g via INTRAVENOUS
  Filled 2018-12-13 (×12): qty 2

## 2018-12-13 MED ORDER — CYCLOBENZAPRINE HCL 10 MG PO TABS
10.0000 mg | ORAL_TABLET | Freq: Three times a day (TID) | ORAL | Status: DC
  Administered 2018-12-13 (×2): 10 mg via ORAL
  Filled 2018-12-13 (×2): qty 1

## 2018-12-13 MED ORDER — TRAMADOL HCL 50 MG PO TABS
50.0000 mg | ORAL_TABLET | Freq: Four times a day (QID) | ORAL | Status: DC | PRN
  Administered 2018-12-13: 50 mg via ORAL
  Filled 2018-12-13: qty 1

## 2018-12-13 MED ORDER — LORAZEPAM 0.5 MG PO TABS
0.5000 mg | ORAL_TABLET | Freq: Three times a day (TID) | ORAL | Status: DC | PRN
  Administered 2018-12-13: 0.5 mg via ORAL
  Filled 2018-12-13: qty 1

## 2018-12-13 MED ORDER — OXYCODONE HCL 5 MG PO TABS: 5.0000 mg | ORAL_TABLET | ORAL | Status: DC | PRN

## 2018-12-13 MED ORDER — OXYCODONE HCL 5 MG PO TABS
10.0000 mg | ORAL_TABLET | ORAL | Status: DC | PRN
  Administered 2018-12-13: 10 mg via ORAL
  Filled 2018-12-13: qty 2

## 2018-12-13 MED ORDER — MORPHINE SULFATE (PF) 2 MG/ML IV SOLN
2.0000 mg | INTRAVENOUS | Status: DC | PRN
  Administered 2018-12-13: 2 mg via INTRAVENOUS
  Filled 2018-12-13: qty 1

## 2018-12-13 MED ORDER — SODIUM CHLORIDE 0.9 % IV SOLN: 2.0000 g | Freq: Three times a day (TID) | INTRAVENOUS | Status: DC

## 2018-12-13 MED ORDER — SODIUM CHLORIDE 0.9 % IV SOLN
2.0000 g | INTRAVENOUS | Status: DC
  Filled 2018-12-13: qty 2

## 2018-12-13 MED ORDER — ENSURE ENLIVE PO LIQD
237.0000 mL | Freq: Two times a day (BID) | ORAL | Status: DC
  Administered 2018-12-14 – 2018-12-30 (×29): 237 mL via ORAL

## 2018-12-13 MED ORDER — HYDROMORPHONE HCL 1 MG/ML IJ SOLN
0.5000 mg | INTRAMUSCULAR | Status: DC | PRN
  Administered 2018-12-13: 0.5 mg via INTRAVENOUS
  Filled 2018-12-13: qty 0.5

## 2018-12-13 MED ORDER — GADOBUTROL 1 MMOL/ML IV SOLN
5.5000 mL | Freq: Once | INTRAVENOUS | Status: AC | PRN
  Administered 2018-12-13: 5.5 mL via INTRAVENOUS

## 2018-12-13 MED ORDER — OXYCODONE-ACETAMINOPHEN 5-325 MG PO TABS: 1.0000 | ORAL_TABLET | Freq: Four times a day (QID) | ORAL | Status: DC | PRN

## 2018-12-13 NOTE — Progress Notes (Signed)
PHARMACY - PHYSICIAN COMMUNICATION CRITICAL VALUE ALERT - BLOOD CULTURE IDENTIFICATION (BCID)  FINDLEY VI is an 48 y.o. male who presented to [facility] on 12/12/2018 with a chief complaint of neck and back pain.   Assessment: concern for aseptic meningitis  Name of physician (or Provider) Contacted: Dr. Posey Pronto   Current antimicrobials: Vancomycin, Ceftriaxone, Acyclovir    Changes to prescribed antibiotics recommended:  Antimicrobials changed to Cefepime per [facility] discussion with ID. Cefepime 2g IV q8h.   Results for orders placed or performed during the hospital encounter of 12/12/18  Blood Culture ID Panel (Reflexed) (Collected: 12/12/2018  9:20 AM)  Result Value Ref Range   Enterococcus species NOT DETECTED NOT DETECTED   Listeria monocytogenes NOT DETECTED NOT DETECTED   Staphylococcus species NOT DETECTED NOT DETECTED   Staphylococcus aureus (BCID) NOT DETECTED NOT DETECTED   Streptococcus species NOT DETECTED NOT DETECTED   Streptococcus agalactiae NOT DETECTED NOT DETECTED   Streptococcus pneumoniae NOT DETECTED NOT DETECTED   Streptococcus pyogenes NOT DETECTED NOT DETECTED   Acinetobacter baumannii NOT DETECTED NOT DETECTED   Enterobacteriaceae species DETECTED (A) NOT DETECTED   Enterobacter cloacae complex NOT DETECTED NOT DETECTED   Escherichia coli NOT DETECTED NOT DETECTED   Klebsiella oxytoca NOT DETECTED NOT DETECTED   Klebsiella pneumoniae NOT DETECTED NOT DETECTED   Proteus species NOT DETECTED NOT DETECTED   Serratia marcescens DETECTED (A) NOT DETECTED   Carbapenem resistance NOT DETECTED NOT DETECTED   Haemophilus influenzae NOT DETECTED NOT DETECTED   Neisseria meningitidis NOT DETECTED NOT DETECTED   Pseudomonas aeruginosa NOT DETECTED NOT DETECTED   Candida albicans NOT DETECTED NOT DETECTED   Candida glabrata NOT DETECTED NOT DETECTED   Candida krusei NOT DETECTED NOT DETECTED   Candida parapsilosis NOT DETECTED NOT DETECTED   Candida tropicalis  NOT DETECTED NOT DETECTED    Luiz Ochoa 12/13/2018  9:12 AM

## 2018-12-13 NOTE — Consult Note (Signed)
Reason for Consult: Osteomyelitis C5-C6 with epidural abscess Referring Physician: Dr. Vivia BirminghamPatel  David Short is an 48 y.o. male.   HPI:  48 year old male presents to the hospital yesterday with severe neck pain.  He is from a prison.  He endorses neck pain that radiates down both arms down his bicep and tricep into his forearms stopping in his wrist.  He has numbness and tingling in both of his hands.  He does endorse weakness in both arms.  All of this pain started 1 week ago.  He has a history of IV heroin use.  He last used IV heroin on September 2nd.  He has been using heroin 2-3 times a week since 2012.  He has been in prison since then.  His pain is a 10/10, constant, and achy.  His pain has progressively gotten worse over the last week.   History reviewed. No pertinent past medical history.  Past Surgical History:  Procedure Laterality Date  . BACK SURGERY     states never had surgery  . FRACTURE SURGERY Left    elbow/wrist/ankle    Allergies  Allergen Reactions  . Tylenol [Acetaminophen] Hives    Social History   Tobacco Use  . Smoking status: Current Every Day Smoker    Packs/day: 1.00    Types: Cigarettes  . Smokeless tobacco: Never Used  Substance Use Topics  . Alcohol use: No    History reviewed. No pertinent family history.   Review of Systems  Positive ROS: As above  All other systems have been reviewed and were otherwise negative with the exception of those mentioned in the HPI and as above.  Objective: Vital signs in last 24 hours: Temp:  [97.7 F (36.5 C)-98.6 F (37 C)] 98.2 F (36.8 C) (09/26 1607) Pulse Rate:  [88-108] 100 (09/26 1607) Resp:  [18-21] 18 (09/26 1607) BP: (129-154)/(87-110) 150/110 (09/26 1607) SpO2:  [93 %-95 %] 93 % (09/26 1607)  General Appearance: Alert, cooperative, no distress, appears stated age, disgruntled look Head: Normocephalic, without obvious abnormality, atraumatic Lungs: respirations unlabored Heart: Regular  rate and rhythm Extremities: Extremities normal, atraumatic, no cyanosis or edema Pulses: 2+ and symmetric all extremities Skin: Skin color, texture, turgor normal, no rashes or lesions  NEUROLOGIC:   Mental status: A&O x4, no aphasia, good attention span, Memory and fund of knowledge Motor Exam -bilateral tricep 3/5, bilateral bicep 4/5, bilateral wrist extension 3/5, bilateral finger extension 4-/ 5, bilateral hand grip 4+/5, bilateral deltoid 4+/5 sensory Exam - grossly normal Reflexes: symmetric, no pathologic reflexes, No Hoffman's, No clonus Coordination -unable to test Gait -unable to test Balance -able to test cranial Nerves: I: smell Not tested  II: visual acuity  OS: na    OD: na  II: visual fields Full to confrontation  II: pupils   III,VII: ptosis   III,IV,VI: extraocular muscles    V: mastication   V: facial light touch sensation    V,VII: corneal reflex    VII: facial muscle function - upper    VII: facial muscle function - lower   VIII: hearing   IX: soft palate elevation    IX,X: gag reflex   XI: trapezius strength    XI: sternocleidomastoid strength   XI: neck flexion strength    XII: tongue strength      Data Review Lab Results  Component Value Date   WBC 6.0 12/13/2018   HGB 10.7 (L) 12/13/2018   HCT 34.4 (L) 12/13/2018   MCV 80.0 12/13/2018  PLT 166 12/13/2018   Lab Results  Component Value Date   NA 140 12/13/2018   K 4.4 12/13/2018   CL 109 12/13/2018   CO2 21 (L) 12/13/2018   BUN 13 12/13/2018   CREATININE 0.45 (L) 12/13/2018   GLUCOSE 175 (H) 12/13/2018   Lab Results  Component Value Date   INR 1.06 12/31/2010    Radiology: Mr Jeri Cos VP Contrast  Result Date: 12/13/2018 CLINICAL DATA:  Neck pain.  Bacteremia and fever EXAM: MRI HEAD WITHOUT AND WITH CONTRAST MRI CERVICAL SPINE WITHOUT AND WITH CONTRAST TECHNIQUE: Multiplanar, multiecho pulse sequences of the brain and surrounding structures, and cervical spine, to include the  craniocervical junction and cervicothoracic junction, were obtained without and with intravenous contrast. CONTRAST:  5.22mL GADAVIST GADOBUTROL 1 MMOL/ML IV SOLN COMPARISON:  None. FINDINGS: MRI HEAD FINDINGS Brain: No acute infarction, hemorrhage, hydrocephalus, extra-axial collection or mass lesion. Normal white matter Normal enhancement postcontrast administration. Vascular: Normal arterial flow voids. Skull and upper cervical spine: Negative Sinuses/Orbits: Mucosal edema paranasal sinuses.  Normal orbit Other: None MRI CERVICAL SPINE FINDINGS Alignment: Normal Vertebrae: Bone marrow edema C5 and C6 vertebral bodies with enhancement throughout the C6 vertebral body. Mild irregularity superior endplate of C6 vertebral body. Ventral epidural soft tissue thickening and enhancement. Small nonenhancing soft tissue in the ventral epidural space of the disc space level of C5-6 may represent a small abscess. Findings most compatible with discitis and osteomyelitis given the history. Negative for fracture Cord: Normal spinal cord signal. Spinal cord evaluation limited by motion. Posterior Fossa, vertebral arteries, paraspinal tissues: Negative Disc levels: C2-3: Negative C3-4: Negative C4-5: Negative C5-6: Bone marrow edema C5 and C6 vertebral bodies with enhancement of the C6 vertebral body. Early endplate erosion superior endplate of C6. Ventral epidural soft tissue thickening behind C5 and C6. Nonenhancing component in the ventral epidural space may represent a small 3 mm abscess. This process is causing cord flattening and moderate spinal stenosis. There is also foraminal narrowing bilaterally due to soft tissue enhancement and thickening. C6-7: Negative C7-T1: Negative IMPRESSION: 1. Negative MRI brain with contrast 2. Findings compatible with discitis and osteomyelitis at C5-6. Ventral epidural phlegmon and small 3 mm ventral epidural abscess causing moderate spinal stenosis. 3. These results were called by  telephone at the time of interpretation on 12/13/2018 at 1:56 pm to provider [facility] PATEL , who verbally acknowledged these results. Electronically Signed   By: Franchot Gallo M.D.   On: 12/13/2018 13:57   Mr Cervical Spine W Wo Contrast  Result Date: 12/13/2018 CLINICAL DATA:  Neck pain.  Bacteremia and fever EXAM: MRI HEAD WITHOUT AND WITH CONTRAST MRI CERVICAL SPINE WITHOUT AND WITH CONTRAST TECHNIQUE: Multiplanar, multiecho pulse sequences of the brain and surrounding structures, and cervical spine, to include the craniocervical junction and cervicothoracic junction, were obtained without and with intravenous contrast. CONTRAST:  5.77mL GADAVIST GADOBUTROL 1 MMOL/ML IV SOLN COMPARISON:  None. FINDINGS: MRI HEAD FINDINGS Brain: No acute infarction, hemorrhage, hydrocephalus, extra-axial collection or mass lesion. Normal white matter Normal enhancement postcontrast administration. Vascular: Normal arterial flow voids. Skull and upper cervical spine: Negative Sinuses/Orbits: Mucosal edema paranasal sinuses.  Normal orbit Other: None MRI CERVICAL SPINE FINDINGS Alignment: Normal Vertebrae: Bone marrow edema C5 and C6 vertebral bodies with enhancement throughout the C6 vertebral body. Mild irregularity superior endplate of C6 vertebral body. Ventral epidural soft tissue thickening and enhancement. Small nonenhancing soft tissue in the ventral epidural space of the disc space level of C5-6 may represent a  small abscess. Findings most compatible with discitis and osteomyelitis given the history. Negative for fracture Cord: Normal spinal cord signal. Spinal cord evaluation limited by motion. Posterior Fossa, vertebral arteries, paraspinal tissues: Negative Disc levels: C2-3: Negative C3-4: Negative C4-5: Negative C5-6: Bone marrow edema C5 and C6 vertebral bodies with enhancement of the C6 vertebral body. Early endplate erosion superior endplate of C6. Ventral epidural soft tissue thickening behind C5 and C6.  Nonenhancing component in the ventral epidural space may represent a small 3 mm abscess. This process is causing cord flattening and moderate spinal stenosis. There is also foraminal narrowing bilaterally due to soft tissue enhancement and thickening. C6-7: Negative C7-T1: Negative IMPRESSION: 1. Negative MRI brain with contrast 2. Findings compatible with discitis and osteomyelitis at C5-6. Ventral epidural phlegmon and small 3 mm ventral epidural abscess causing moderate spinal stenosis. 3. These results were called by telephone at the time of interpretation on 12/13/2018 at 1:56 pm to provider [facility] PATEL , who verbally acknowledged these results. Electronically Signed   By: Marlan Palau M.D.   On: 12/13/2018 13:57   Dg Chest Portable 1 View  Result Date: 12/12/2018 CLINICAL DATA:  Fever EXAM: PORTABLE CHEST 1 VIEW COMPARISON:  November 29, 2012 FINDINGS: No edema consolidation. Heart size and pulmonary vascularity are normal. No adenopathy. No bone lesions. IMPRESSION: No edema or consolidation. Electronically Signed   By: Bretta Bang III M.D.   On: 12/12/2018 10:18   Assessment/Plan: 49 year old male noted yesterday to the hospital for neck pain x1 week.  MRI cervical spine shows osteomyelitis at C5-C6 with a ventral epidural abscess causing moderate spinal stenosis.  It was reported to me that the patient had no weakness, however upon my exam he definitely has some weakness in both arms.  Planning for transfer to [facility].  We will order stat CT of the cervical spine for surgical planning.   Tiana Loft [facility] 12/13/2018 7:54 PM

## 2018-12-13 NOTE — Progress Notes (Signed)
Pt transferred to [facility] [id number]. Report called to receiving RN. Transport via [employer] at bedside. Pt stable and pain managed.  Kizzie Ide, RN

## 2018-12-13 NOTE — Progress Notes (Signed)
[facility] Progress Note  Patient: David Short WFU:[id number]   PCP: Default, Provider, MD DOB: 09-19-1970   DOA: 12/12/2018   DOS: 12/13/2018   Date of Service: the patient was seen and examined on 12/13/2018  Chief Complaint  Patient presents with   Neck Pain   Fever   Brief hospital course: Pt. with PMH of IV heroin abuse and cocaine abuse as well as chronic back pain and prior fractures on left arm and left ankle treated operatively; presented with complain of neck pain and back pain, was found to have osteomyelitis of C5-C6 spine with a phlegmon.  Currently further plan is continue IV antibiotics and follow neurosurgery and ID recommendation.  Subjective: Patient reports right-sided headache.  Left-sided neck pain and back pain is resolved.  No nausea no vomiting.  No fever no chills.  Assessment and Plan: 1.  C5-C6 vertebral osteomyelitis. Concern for meningitis. Bacteremia with Serratia. IVDA with heroin last used 3 weeks ago. Polysubstance abuse with cocaine Back pain and neck pain Presents with above complaint. Underwent CSF examination which showed some pleocytosis. Patient was recommended for admission proceed possible meningitis. Started on IV broad-spectrum IV antibiotics as well as antiviral medication. Blood cultures come back positive for Serratia. MRI brain with and without contrast as well as MRI C-spine with and without contrast was performed which shows evidence of C5-C6 discitis as well as osteomyelitis and ventral phlegmon. Discussed with ID who recommends continue antibiotics. Discussed with neurosurgery who will evaluate the patient for possible neurosurgical intervention but currently recommends to continue with the antibiotics. Patient will be treated with IV cefepime given concern for AMPc  Resistance Repeat blood cultures ordered. HSV PCR currently pending.  2.  Pain control Flexeril and tramadol. Patient is refusing medications that  includes Tylenol for now.  3. Tylenol side effect Patient reports itching but no hives. Allergies updated.  4. Accelerated hypertension. Sinus tachycardia. Patient reports that he has significant anxiety and therefore his blood pressure is high. No prior history high blood pressure. We will treat with clonidine and labetalol. UDS negative for cocaine.  5.  Polysubstance abuse. Patient shoots heroin 3 times weekly.  As well as cocaine occasionally.   Monitor. Patient claims he uses clean needles.  Diet: Cardiac diet  DVT Prophylaxis: Subcutaneous Lovenox  Advance goals of care discussion: Full code  Family Communication: no family was present at bedside, at the time of interview.   Disposition:  Transfer to [facility].  Consultants: Neurosurgery, ID Procedures: LP  Scheduled Meds:  cloNIDine  0.1 mg Oral Daily   cyclobenzaprine  10 mg Oral TID   enoxaparin (LOVENOX) injection  40 mg Subcutaneous Q24H   feeding supplement (ENSURE ENLIVE)  237 mL Oral BID BM   Continuous Infusions:  ceFEPime (MAXIPIME) IV 2 g (12/13/18 2050)   PRN Meds: diphenhydrAMINE, labetalol, LORazepam, morphine injection, ondansetron **OR** ondansetron (ZOFRAN) IV, oxyCODONE, senna-docusate, traMADol, zolpidem Antibiotics: Anti-infectives (From admission, onward)   Start     Dose/Rate Route Frequency Ordered Stop   12/13/18 2000  ceFEPIme (MAXIPIME) 2 g in sodium chloride 0.9 % 100 mL IVPB  Status:  Discontinued     2 g 200 mL/hr over 30 Minutes Intravenous Every 8 hours 12/13/18 1054 12/13/18 1056   12/13/18 2000  ceFEPIme (MAXIPIME) 2 g in sodium chloride 0.9 % 100 mL IVPB     2 g 200 mL/hr over 30 Minutes Intravenous Every 8 hours 12/13/18 1056     12/13/18 1100  ceFEPIme (MAXIPIME) 2  g in sodium chloride 0.9 % 100 mL IVPB  Status:  Discontinued     2 g 200 mL/hr over 30 Minutes Intravenous STAT 12/13/18 1054 12/13/18 1054   12/13/18 1100  ceFEPIme (MAXIPIME) 2 g in sodium chloride 0.9 % 100  mL IVPB     2 g 200 mL/hr over 30 Minutes Intravenous STAT 12/13/18 1055 12/13/18 1207   12/13/18 0600  cefTRIAXone (ROCEPHIN) 2 g in sodium chloride 0.9 % 100 mL IVPB  Status:  Discontinued     2 g 200 mL/hr over 30 Minutes Intravenous Every 12 hours 12/12/18 1715 12/13/18 1042   12/13/18 0200  vancomycin (VANCOCIN) IVPB 750 mg/150 ml premix  Status:  Discontinued     750 mg 150 mL/hr over 60 Minutes Intravenous Every 8 hours 12/12/18 1740 12/13/18 0930   12/12/18 1745  acyclovir (ZOVIRAX) 635 mg in dextrose 5 % 100 mL IVPB  Status:  Discontinued     10 mg/kg  63.5 kg 112.7 mL/hr over 60 Minutes Intravenous Every 8 hours 12/12/18 1740 12/13/18 1042   12/12/18 1715  vancomycin (VANCOCIN) IVPB 1000 mg/200 mL premix  Status:  Discontinued     1,000 mg 200 mL/hr over 60 Minutes Intravenous  Once 12/12/18 1715 12/12/18 1724   12/12/18 1500  acyclovir (ZOVIRAX) 635 mg in dextrose 5 % 100 mL IVPB     10 mg/kg  63.5 kg 112.7 mL/hr over 60 Minutes Intravenous  Once 12/12/18 1350 12/12/18 1824   12/12/18 1430  vancomycin (VANCOCIN) 1,250 mg in sodium chloride 0.9 % 250 mL IVPB     1,250 mg 166.7 mL/hr over 90 Minutes Intravenous  Once 12/12/18 1350 12/12/18 1657   12/12/18 1400  cefTRIAXone (ROCEPHIN) 2 g in sodium chloride 0.9 % 100 mL IVPB     2 g 200 mL/hr over 30 Minutes Intravenous  Once 12/12/18 1350 12/12/18 1657       Objective: Physical Exam: Vitals:   12/13/18 0132 12/13/18 0640 12/13/18 1607 12/13/18 2038  BP: 129/87 (!) 154/107 (!) 150/110 (!) 145/110  Pulse: (!) 102 88 100 94  Resp: 18 18 18 17   Temp:  97.7 F (36.5 C) 98.2 F (36.8 C) 97.8 F (36.6 C)  TempSrc:  Oral Oral Oral  SpO2:  94% 93% 95%  Weight:      Height:        Intake/Output Summary (Last 24 hours) at 12/13/2018 2054 Last data filed at 12/13/2018 1750 Gross per 24 hour  Intake 1542.22 ml  Output 2900 ml  Net -1357.78 ml   Filed Weights   12/12/18 0835  Weight: 63.5 kg   General: alert and  oriented to time, place, and person. Appear in mild distress, affect appropriate Eyes: PERRL, Conjunctiva normal ENT: Oral Mucosa Clear, moist  Neck: no JVD, no Abnormal Mass Or lumps Cardiovascular: S1 and S2 Present, no Murmur, peripheral pulses symmetrical Respiratory: good respiratory effort, Bilateral Air entry equal and Decreased, no signs of accessory muscle use, Clear to Auscultation, no Crackles, no wheezes Abdomen: Bowel Sound present, Soft and no tenderness, no hernia Skin: no rashes  Extremities: no Pedal edema, no calf tenderness Neurologic: without any new focal findings Gait not checked due to patient safety concerns  Data Reviewed: I have personally reviewed and interpreted daily labs, tele strips, imagings as discussed above. I reviewed all nursing notes, pharmacy notes, vitals, pertinent old records I have discussed plan of care as described above with RN and patient/family.  CBC: Recent Labs  Lab  12/12/18 0920 12/13/18 0522  WBC 7.1 6.0  NEUTROABS 4.6 4.9  HGB 11.0* 10.7*  HCT 35.4* 34.4*  MCV 80.8 80.0  PLT 140* 166   Basic Metabolic Panel: Recent Labs  Lab 12/12/18 0920 12/13/18 0522  NA 139 140  K 3.5 4.4  CL 104 109  CO2 25 21*  GLUCOSE 82 175*  BUN 7 13  CREATININE 0.54* 0.45*  CALCIUM 9.2 9.4    Liver Function Tests: Recent Labs  Lab 12/13/18 0522  AST 14*  ALT 15  ALKPHOS 76  BILITOT 0.3  PROT 7.3  ALBUMIN 3.2*   No results for input(s): LIPASE, AMYLASE in the last 168 hours. No results for input(s): AMMONIA in the last 168 hours. Coagulation Profile: No results for input(s): INR, PROTIME in the last 168 hours. Cardiac Enzymes: No results for input(s): CKTOTAL, CKMB, CKMBINDEX, TROPONINI in the last 168 hours. BNP (last 3 results) No results for input(s): PROBNP in the last 8760 hours. CBG: No results for input(s): GLUCAP in the last 168 hours. Studies: Ct Cervical Spine Wo Contrast  Result Date: 12/13/2018 CLINICAL DATA:   Neck pain over the last week. Osteomyelitis discitis C5-6 by MRI report. Bilateral arm weakness. EXAM: CT CERVICAL SPINE WITHOUT CONTRAST TECHNIQUE: Multidetector CT imaging of the cervical spine was performed without intravenous contrast. Multiplanar CT image reconstructions were also generated. COMPARISON:  MRI earlier same day FINDINGS: Alignment: Straightening of the normal cervical lordosis. Skull base and vertebrae: Endplate irregularity at the superior endplate of C6 particularly anterior. No other focal bone finding. The inferior endplate at C5 appears intact. Soft tissues and spinal canal: No bony stenosis of the canal or foramina. No soft tissue abnormality definable by noncontrast CT. MRI shows C5-6 ventral epidural enhancing tissue. Disc levels: Disc levels are normal with exception of C5-6. The disc space does not appear frankly narrowed. Inferior endplate of C5 is intact. Superior endplate destruction anteriorly at C6 consistent with the clinical diagnosis of osteomyelitis. However, the disc itself at MRI does not show abnormal T2 signal or contrast enhancement. I also note that the white count is normal. Therefore, I suppose there is some possibility that this could be a pathologic fracture at C6 not related to infection or at least not 2 typical discitis osteomyelitis. Primary osteomyelitis is possible, which brings in 2 consideration it atypical organisms such as tuberculosis rather than the ordinary staph infection which usually begins in the disc. Upper chest: Emphysema and pulmonary scarring. Other: None IMPRESSION: The only CT finding is superior endplate irregularity and destruction anteriorly at C6. Ventral epidural process at this level is not specifically appreciated by noncontrast CT but clearly shown by MRI. The inferior endplate at C5 is intact. The C5-6 disc space height is normal. This in combination with the MR findings of absence of T2 signal or enhancement within the C5-6 disc raises  the possibility of other processes besides typical staph discitis osteomyelitis. Atypical organisms such as tuberculosis can primarily infect the vertebral body. Tumor not completely excluded in this case. I note that the white count is also normal. Electronically Signed   By: Paulina Fusi M.D.   On: 12/13/2018 20:51   Mr Laqueta Jean ZO Contrast  Result Date: 12/13/2018 CLINICAL DATA:  Neck pain.  Bacteremia and fever EXAM: MRI HEAD WITHOUT AND WITH CONTRAST MRI CERVICAL SPINE WITHOUT AND WITH CONTRAST TECHNIQUE: Multiplanar, multiecho pulse sequences of the brain and surrounding structures, and cervical spine, to include the craniocervical junction and cervicothoracic junction, were  obtained without and with intravenous contrast. CONTRAST:  5.105mL GADAVIST GADOBUTROL 1 MMOL/ML IV SOLN COMPARISON:  None. FINDINGS: MRI HEAD FINDINGS Brain: No acute infarction, hemorrhage, hydrocephalus, extra-axial collection or mass lesion. Normal white matter Normal enhancement postcontrast administration. Vascular: Normal arterial flow voids. Skull and upper cervical spine: Negative Sinuses/Orbits: Mucosal edema paranasal sinuses.  Normal orbit Other: None MRI CERVICAL SPINE FINDINGS Alignment: Normal Vertebrae: Bone marrow edema C5 and C6 vertebral bodies with enhancement throughout the C6 vertebral body. Mild irregularity superior endplate of C6 vertebral body. Ventral epidural soft tissue thickening and enhancement. Small nonenhancing soft tissue in the ventral epidural space of the disc space level of C5-6 may represent a small abscess. Findings most compatible with discitis and osteomyelitis given the history. Negative for fracture Cord: Normal spinal cord signal. Spinal cord evaluation limited by motion. Posterior Fossa, vertebral arteries, paraspinal tissues: Negative Disc levels: C2-3: Negative C3-4: Negative C4-5: Negative C5-6: Bone marrow edema C5 and C6 vertebral bodies with enhancement of the C6 vertebral body. Early  endplate erosion superior endplate of C6. Ventral epidural soft tissue thickening behind C5 and C6. Nonenhancing component in the ventral epidural space may represent a small 3 mm abscess. This process is causing cord flattening and moderate spinal stenosis. There is also foraminal narrowing bilaterally due to soft tissue enhancement and thickening. C6-7: Negative C7-T1: Negative IMPRESSION: 1. Negative MRI brain with contrast 2. Findings compatible with discitis and osteomyelitis at C5-6. Ventral epidural phlegmon and small 3 mm ventral epidural abscess causing moderate spinal stenosis. 3. These results were called by telephone at the time of interpretation on 12/13/2018 at 1:56 pm to provider [facility] Lamonica Trueba , who verbally acknowledged these results. Electronically Signed   By: Marlan Palau M.D.   On: 12/13/2018 13:57   Mr Cervical Spine W Wo Contrast  Result Date: 12/13/2018 CLINICAL DATA:  Neck pain.  Bacteremia and fever EXAM: MRI HEAD WITHOUT AND WITH CONTRAST MRI CERVICAL SPINE WITHOUT AND WITH CONTRAST TECHNIQUE: Multiplanar, multiecho pulse sequences of the brain and surrounding structures, and cervical spine, to include the craniocervical junction and cervicothoracic junction, were obtained without and with intravenous contrast. CONTRAST:  5.63mL GADAVIST GADOBUTROL 1 MMOL/ML IV SOLN COMPARISON:  None. FINDINGS: MRI HEAD FINDINGS Brain: No acute infarction, hemorrhage, hydrocephalus, extra-axial collection or mass lesion. Normal white matter Normal enhancement postcontrast administration. Vascular: Normal arterial flow voids. Skull and upper cervical spine: Negative Sinuses/Orbits: Mucosal edema paranasal sinuses.  Normal orbit Other: None MRI CERVICAL SPINE FINDINGS Alignment: Normal Vertebrae: Bone marrow edema C5 and C6 vertebral bodies with enhancement throughout the C6 vertebral body. Mild irregularity superior endplate of C6 vertebral body. Ventral epidural soft tissue thickening and enhancement.  Small nonenhancing soft tissue in the ventral epidural space of the disc space level of C5-6 may represent a small abscess. Findings most compatible with discitis and osteomyelitis given the history. Negative for fracture Cord: Normal spinal cord signal. Spinal cord evaluation limited by motion. Posterior Fossa, vertebral arteries, paraspinal tissues: Negative Disc levels: C2-3: Negative C3-4: Negative C4-5: Negative C5-6: Bone marrow edema C5 and C6 vertebral bodies with enhancement of the C6 vertebral body. Early endplate erosion superior endplate of C6. Ventral epidural soft tissue thickening behind C5 and C6. Nonenhancing component in the ventral epidural space may represent a small 3 mm abscess. This process is causing cord flattening and moderate spinal stenosis. There is also foraminal narrowing bilaterally due to soft tissue enhancement and thickening. C6-7: Negative C7-T1: Negative IMPRESSION: 1. Negative MRI brain with contrast  2. Findings compatible with discitis and osteomyelitis at C5-6. Ventral epidural phlegmon and small 3 mm ventral epidural abscess causing moderate spinal stenosis. 3. These results were called by telephone at the time of interpretation on 12/13/2018 at 1:56 pm to provider [facility] Ambriana Selway , who verbally acknowledged these results. Electronically Signed   By: Marlan Palau M.D.   On: 12/13/2018 13:57     Time spent: 35 minutes  Author: Lynden Oxford, MD [facility] 12/13/2018 8:54 PM  To reach On-call, see care teams to locate the attending and reach out to them via [email or web address]. If 7PM-7AM, please contact night-coverage If you still have difficulty reaching the attending provider, please page the [facility] (Director on Call) for [facility] on amion for assistance.

## 2018-12-14 ENCOUNTER — Inpatient Hospital Stay (HOSPITAL_COMMUNITY): Admitting: Certified Registered Nurse Anesthetist

## 2018-12-14 ENCOUNTER — Encounter (HOSPITAL_COMMUNITY): Admission: EM | Disposition: A | Discharge status: Home / Self Care | Payer: Self-pay | Attending: Internal Medicine

## 2018-12-14 ENCOUNTER — Encounter (HOSPITAL_COMMUNITY): Payer: Self-pay | Admitting: Orthopedic Surgery

## 2018-12-14 ENCOUNTER — Inpatient Hospital Stay (HOSPITAL_COMMUNITY)

## 2018-12-14 DIAGNOSIS — Z981 Arthrodesis status: Secondary | ICD-10-CM

## 2018-12-14 HISTORY — PX: ANTERIOR CERVICAL DECOMP/DISCECTOMY FUSION: SHX1161

## 2018-12-14 LAB — TYPE AND SCREEN
ABO/RH(D): O POS
Antibody Screen: NEGATIVE

## 2018-12-14 LAB — PROTIME-INR
INR: 1 (ref 0.8–1.2)
Prothrombin Time: 13.4 seconds (ref 11.4–15.2)

## 2018-12-14 LAB — CBC WITH DIFFERENTIAL/PLATELET
Abs Immature Granulocytes: 0.05 10*3/uL (ref 0.00–0.07)
Basophils Absolute: 0 10*3/uL (ref 0.0–0.1)
Basophils Relative: 0 %
Eosinophils Absolute: 0.1 10*3/uL (ref 0.0–0.5)
Eosinophils Relative: 1 %
HCT: 35.3 % — ABNORMAL LOW (ref 39.0–52.0)
Hemoglobin: 11.5 g/dL — ABNORMAL LOW (ref 13.0–17.0)
Immature Granulocytes: 1 %
Lymphocytes Relative: 13 %
Lymphs Abs: 1.2 10*3/uL (ref 0.7–4.0)
MCH: 25.6 pg — ABNORMAL LOW (ref 26.0–34.0)
MCHC: 32.6 g/dL (ref 30.0–36.0)
MCV: 78.6 fL — ABNORMAL LOW (ref 80.0–100.0)
Monocytes Absolute: 0.8 10*3/uL (ref 0.1–1.0)
Monocytes Relative: 8 %
Neutro Abs: 7.3 10*3/uL (ref 1.7–7.7)
Neutrophils Relative %: 77 %
Platelets: 185 10*3/uL (ref 150–400)
RBC: 4.49 MIL/uL (ref 4.22–5.81)
RDW: 18.8 % — ABNORMAL HIGH (ref 11.5–15.5)
WBC: 9.4 10*3/uL (ref 4.0–10.5)
nRBC: 0 % (ref 0.0–0.2)

## 2018-12-14 LAB — COMPREHENSIVE METABOLIC PANEL
ALT: 20 U/L (ref 0–44)
AST: 17 U/L (ref 15–41)
Albumin: 3.3 g/dL — ABNORMAL LOW (ref 3.5–5.0)
Alkaline Phosphatase: 77 U/L (ref 38–126)
Anion gap: 10 (ref 5–15)
BUN: 15 mg/dL (ref 6–20)
CO2: 24 mmol/L (ref 22–32)
Calcium: 9.8 mg/dL (ref 8.9–10.3)
Chloride: 103 mmol/L (ref 98–111)
Creatinine, Ser: 0.65 mg/dL (ref 0.61–1.24)
GFR calc Af Amer: >60 mL/min (ref >60)
GFR calc non Af Amer: >60 mL/min (ref >60)
Glucose, Bld: 90 mg/dL (ref 70–99)
Potassium: 4.2 mmol/L (ref 3.5–5.1)
Sodium: 137 mmol/L (ref 135–145)
Total Bilirubin: 0.4 mg/dL (ref 0.3–1.2)
Total Protein: 7.6 g/dL (ref 6.5–8.1)

## 2018-12-14 LAB — C-REACTIVE PROTEIN: CRP: 1.4 mg/dL — ABNORMAL HIGH (ref <1.0)

## 2018-12-14 LAB — MAGNESIUM: Magnesium: 1.9 mg/dL (ref 1.7–2.4)

## 2018-12-14 LAB — ABO/RH: ABO/RH(D): O POS

## 2018-12-14 SURGERY — ANTERIOR CERVICAL DECOMPRESSION/DISCECTOMY FUSION 1 LEVEL
Anesthesia: General | Site: Neck

## 2018-12-14 MED ORDER — CHLORHEXIDINE GLUCONATE CLOTH 2 % EX PADS: 6.0000 | MEDICATED_PAD | Freq: Every day | CUTANEOUS | Status: DC

## 2018-12-14 MED ORDER — MIDAZOLAM HCL 5 MG/5ML IJ SOLN
INTRAMUSCULAR | Status: DC | PRN
  Administered 2018-12-14: 2 mg via INTRAVENOUS

## 2018-12-14 MED ORDER — MIDAZOLAM HCL 2 MG/2ML IJ SOLN
INTRAMUSCULAR | Status: AC
  Filled 2018-12-14: qty 2

## 2018-12-14 MED ORDER — LIDOCAINE 2% (20 MG/ML) 5 ML SYRINGE
INTRAMUSCULAR | Status: AC
  Filled 2018-12-14: qty 5

## 2018-12-14 MED ORDER — PHENYLEPHRINE HCL (PRESSORS) 10 MG/ML IV SOLN
INTRAVENOUS | Status: DC | PRN
  Administered 2018-12-14: 80 ug via INTRAVENOUS

## 2018-12-14 MED ORDER — THROMBIN 5000 UNITS EX SOLR
OROMUCOSAL | Status: DC | PRN
  Administered 2018-12-14: 10:00:00 5 mL via TOPICAL

## 2018-12-14 MED ORDER — ONDANSETRON HCL 4 MG/2ML IJ SOLN
INTRAMUSCULAR | Status: AC
  Filled 2018-12-14: qty 2

## 2018-12-14 MED ORDER — FENTANYL CITRATE (PF) 250 MCG/5ML IJ SOLN
INTRAMUSCULAR | Status: AC
  Filled 2018-12-14: qty 5

## 2018-12-14 MED ORDER — OXYCODONE HCL 5 MG PO TABS: 5.0000 mg | ORAL_TABLET | Freq: Once | ORAL | Status: DC | PRN

## 2018-12-14 MED ORDER — ROCURONIUM BROMIDE 100 MG/10ML IV SOLN
INTRAVENOUS | Status: DC | PRN
  Administered 2018-12-14: 50 mg via INTRAVENOUS
  Administered 2018-12-14: 30 mg via INTRAVENOUS
  Administered 2018-12-14: 20 mg via INTRAVENOUS

## 2018-12-14 MED ORDER — POTASSIUM CHLORIDE IN NACL 20-0.9 MEQ/L-% IV SOLN
INTRAVENOUS | Status: DC
  Administered 2018-12-14 – 2018-12-15 (×2): via INTRAVENOUS
  Filled 2018-12-14 (×2): qty 1000

## 2018-12-14 MED ORDER — DEXAMETHASONE SODIUM PHOSPHATE 4 MG/ML IJ SOLN
INTRAMUSCULAR | Status: DC | PRN
  Administered 2018-12-14: 10 mg via INTRAVENOUS

## 2018-12-14 MED ORDER — FENTANYL CITRATE (PF) 100 MCG/2ML IJ SOLN
INTRAMUSCULAR | Status: DC | PRN
  Administered 2018-12-14 (×3): 50 ug via INTRAVENOUS
  Administered 2018-12-14: 100 ug via INTRAVENOUS
  Administered 2018-12-14: 50 ug via INTRAVENOUS

## 2018-12-14 MED ORDER — HYDROMORPHONE HCL 1 MG/ML IJ SOLN
1.0000 mg | INTRAMUSCULAR | Status: DC | PRN
  Administered 2018-12-14: 1 mg via INTRAVENOUS
  Filled 2018-12-14: qty 1

## 2018-12-14 MED ORDER — ROCURONIUM BROMIDE 10 MG/ML (PF) SYRINGE
PREFILLED_SYRINGE | INTRAVENOUS | Status: AC
  Filled 2018-12-14: qty 10

## 2018-12-14 MED ORDER — DEXAMETHASONE SODIUM PHOSPHATE 10 MG/ML IJ SOLN
INTRAMUSCULAR | Status: AC
  Filled 2018-12-14: qty 1

## 2018-12-14 MED ORDER — SUGAMMADEX SODIUM 200 MG/2ML IV SOLN
INTRAVENOUS | Status: DC | PRN
  Administered 2018-12-14: 150 mg via INTRAVENOUS

## 2018-12-14 MED ORDER — THROMBIN 5000 UNITS EX SOLR
CUTANEOUS | Status: DC | PRN
  Administered 2018-12-14: 5000 [IU] via TOPICAL

## 2018-12-14 MED ORDER — SODIUM CHLORIDE 0.9 % IV SOLN
INTRAVENOUS | Status: DC | PRN
  Administered 2018-12-14: 15 ug/min via INTRAVENOUS

## 2018-12-14 MED ORDER — MENTHOL 3 MG MT LOZG: 1.0000 | LOZENGE | OROMUCOSAL | Status: DC | PRN

## 2018-12-14 MED ORDER — THROMBIN 5000 UNITS EX SOLR
CUTANEOUS | Status: AC
  Filled 2018-12-14: qty 15000

## 2018-12-14 MED ORDER — SODIUM CHLORIDE 0.9 % IV SOLN: INTRAVENOUS | Status: DC | PRN

## 2018-12-14 MED ORDER — ONDANSETRON HCL 4 MG/2ML IJ SOLN: 4.0000 mg | Freq: Four times a day (QID) | INTRAMUSCULAR | Status: DC | PRN

## 2018-12-14 MED ORDER — MIDAZOLAM HCL 2 MG/2ML IJ SOLN
2.0000 mg | Freq: Once | INTRAMUSCULAR | Status: AC
  Administered 2018-12-14: 2 mg via INTRAVENOUS

## 2018-12-14 MED ORDER — OXYCODONE HCL 5 MG/5ML PO SOLN: 5.0000 mg | Freq: Once | ORAL | Status: DC | PRN

## 2018-12-14 MED ORDER — PROPOFOL 10 MG/ML IV BOLUS
INTRAVENOUS | Status: DC | PRN
  Administered 2018-12-14: 150 mg via INTRAVENOUS

## 2018-12-14 MED ORDER — 0.9 % SODIUM CHLORIDE (POUR BTL) OPTIME
TOPICAL | Status: DC | PRN
  Administered 2018-12-14: 1000 mL

## 2018-12-14 MED ORDER — HEMOSTATIC AGENTS (NO CHARGE) OPTIME
TOPICAL | Status: DC | PRN
  Administered 2018-12-14: 1 via TOPICAL

## 2018-12-14 MED ORDER — MORPHINE SULFATE (PF) 2 MG/ML IV SOLN: 2.0000 mg | INTRAVENOUS | Status: DC | PRN

## 2018-12-14 MED ORDER — METHOCARBAMOL 1000 MG/10ML IJ SOLN
500.0000 mg | Freq: Four times a day (QID) | INTRAVENOUS | Status: DC | PRN
  Filled 2018-12-14: qty 5

## 2018-12-14 MED ORDER — FENTANYL CITRATE (PF) 100 MCG/2ML IJ SOLN
25.0000 ug | INTRAMUSCULAR | Status: DC | PRN
  Administered 2018-12-14: 50 ug via INTRAVENOUS
  Administered 2018-12-14 (×2): 25 ug via INTRAVENOUS

## 2018-12-14 MED ORDER — LIDOCAINE HCL (CARDIAC) PF 100 MG/5ML IV SOSY
PREFILLED_SYRINGE | INTRAVENOUS | Status: DC | PRN
  Administered 2018-12-14: 50 mg via INTRAVENOUS

## 2018-12-14 MED ORDER — SENNA 8.6 MG PO TABS
1.0000 | ORAL_TABLET | Freq: Two times a day (BID) | ORAL | Status: DC
  Administered 2018-12-15 – 2018-12-25 (×23): 8.6 mg via ORAL
  Filled 2018-12-14 (×27): qty 1

## 2018-12-14 MED ORDER — METHOCARBAMOL 500 MG PO TABS
500.0000 mg | ORAL_TABLET | Freq: Four times a day (QID) | ORAL | Status: DC | PRN
  Administered 2018-12-14 – 2018-12-30 (×23): 500 mg via ORAL
  Filled 2018-12-14 (×23): qty 1

## 2018-12-14 MED ORDER — PROPOFOL 10 MG/ML IV BOLUS
INTRAVENOUS | Status: AC
  Filled 2018-12-14: qty 20

## 2018-12-14 MED ORDER — PHENOL 1.4 % MT LIQD
1.0000 | OROMUCOSAL | Status: DC | PRN
  Administered 2018-12-14: 1 via OROMUCOSAL
  Filled 2018-12-14: qty 177

## 2018-12-14 MED ORDER — ONDANSETRON HCL 4 MG PO TABS
4.0000 mg | ORAL_TABLET | Freq: Four times a day (QID) | ORAL | Status: DC | PRN
  Administered 2018-12-22: 4 mg via ORAL
  Filled 2018-12-14: qty 1

## 2018-12-14 MED ORDER — CALCIUM CARBONATE ANTACID 500 MG PO CHEW
1.0000 | CHEWABLE_TABLET | ORAL | Status: DC | PRN
  Administered 2018-12-14 – 2018-12-19 (×3): 200 mg via ORAL
  Filled 2018-12-14 (×3): qty 1

## 2018-12-14 MED ORDER — LACTATED RINGERS IV SOLN
INTRAVENOUS | Status: DC
  Administered 2018-12-14 (×3): via INTRAVENOUS

## 2018-12-14 MED ORDER — SODIUM CHLORIDE 0.9 % IV SOLN
250.0000 mL | INTRAVENOUS | Status: DC
  Administered 2018-12-26: 250 mL via INTRAVENOUS

## 2018-12-14 MED ORDER — OXYCODONE HCL 5 MG PO TABS
10.0000 mg | ORAL_TABLET | ORAL | Status: DC | PRN
  Administered 2018-12-14 – 2018-12-15 (×6): 10 mg via ORAL
  Filled 2018-12-14 (×6): qty 2

## 2018-12-14 MED ORDER — FENTANYL CITRATE (PF) 100 MCG/2ML IJ SOLN
INTRAMUSCULAR | Status: AC
  Filled 2018-12-14: qty 2

## 2018-12-14 MED ORDER — SODIUM CHLORIDE 0.9% FLUSH: 10.0000 mL | INTRAVENOUS | Status: DC | PRN

## 2018-12-14 MED ORDER — SODIUM CHLORIDE 0.9% FLUSH: 3.0000 mL | INTRAVENOUS | Status: DC | PRN

## 2018-12-14 MED ORDER — BUPIVACAINE HCL (PF) 0.25 % IJ SOLN
INTRAMUSCULAR | Status: DC | PRN
  Administered 2018-12-14: 1 mL

## 2018-12-14 MED ORDER — BUPIVACAINE HCL (PF) 0.25 % IJ SOLN
INTRAMUSCULAR | Status: AC
  Filled 2018-12-14: qty 30

## 2018-12-14 MED ORDER — SODIUM CHLORIDE 0.9 % IV SOLN
INTRAVENOUS | Status: DC | PRN
  Administered 2018-12-14: 500 mL

## 2018-12-14 MED ORDER — SODIUM CHLORIDE 0.9% FLUSH
3.0000 mL | Freq: Two times a day (BID) | INTRAVENOUS | Status: DC
  Administered 2018-12-16 – 2018-12-30 (×23): 3 mL via INTRAVENOUS

## 2018-12-14 MED ORDER — CEFAZOLIN SODIUM-DEXTROSE 2-4 GM/100ML-% IV SOLN
INTRAVENOUS | Status: AC
  Filled 2018-12-14: qty 100

## 2018-12-14 SURGICAL SUPPLY — 59 items
BAG DECANTER FOR FLEXI CONT (MISCELLANEOUS) ×3 IMPLANT
BASKET BONE COLLECTION (BASKET) IMPLANT
BENZOIN TINCTURE PRP APPL 2/3 (GAUZE/BANDAGES/DRESSINGS) ×3 IMPLANT
BIT DRILL 14MM (INSTRUMENTS) ×1 IMPLANT
BUR MATCHSTICK NEURO 3.0 LAGG (BURR) ×3 IMPLANT
CANISTER SUCT 3000ML PPV (MISCELLANEOUS) ×3 IMPLANT
CARTRIDGE OIL MAESTRO DRILL (MISCELLANEOUS) ×1 IMPLANT
CLOSURE STERI-STRIP 1/2X4 (GAUZE/BANDAGES/DRESSINGS) ×1
CLOSURE WOUND 1/2 X4 (GAUZE/BANDAGES/DRESSINGS) ×1
CLSR STERI-STRIP ANTIMIC 1/2X4 (GAUZE/BANDAGES/DRESSINGS) ×2 IMPLANT
CONT SPEC 4OZ CLIKSEAL STRL BL (MISCELLANEOUS) ×3 IMPLANT
COVER WAND RF STERILE (DRAPES) ×3 IMPLANT
DIFFUSER DRILL AIR PNEUMATIC (MISCELLANEOUS) ×3 IMPLANT
DRAPE C-ARM 42X72 X-RAY (DRAPES) ×6 IMPLANT
DRAPE LAPAROTOMY 100X72 PEDS (DRAPES) ×3 IMPLANT
DRAPE MICROSCOPE LEICA (MISCELLANEOUS) ×3 IMPLANT
DRILL 14MM (INSTRUMENTS) ×3
DRSG OPSITE 4X5.5 SM (GAUZE/BANDAGES/DRESSINGS) ×3 IMPLANT
DRSG TELFA 3X8 NADH (GAUZE/BANDAGES/DRESSINGS) ×3 IMPLANT
DURAPREP 26ML APPLICATOR (WOUND CARE) ×3 IMPLANT
DURAPREP 6ML APPLICATOR 50/CS (WOUND CARE) IMPLANT
ELECT COATED BLADE 2.86 ST (ELECTRODE) ×3 IMPLANT
ELECT REM PT RETURN 9FT ADLT (ELECTROSURGICAL) ×3
ELECTRODE REM PT RTRN 9FT ADLT (ELECTROSURGICAL) ×1 IMPLANT
GAUZE 4X4 16PLY RFD (DISPOSABLE) IMPLANT
GLOVE BIO SURGEON STRL SZ7 (GLOVE) ×9 IMPLANT
GLOVE BIO SURGEON STRL SZ8 (GLOVE) ×3 IMPLANT
GLOVE BIOGEL PI IND STRL 7.0 (GLOVE) ×1 IMPLANT
GLOVE BIOGEL PI IND STRL 7.5 (GLOVE) ×2 IMPLANT
GLOVE BIOGEL PI INDICATOR 7.0 (GLOVE) ×2
GLOVE BIOGEL PI INDICATOR 7.5 (GLOVE) ×4
GOWN STRL REUS W/ TWL LRG LVL3 (GOWN DISPOSABLE) ×3 IMPLANT
GOWN STRL REUS W/ TWL XL LVL3 (GOWN DISPOSABLE) IMPLANT
GOWN STRL REUS W/TWL 2XL LVL3 (GOWN DISPOSABLE) ×3 IMPLANT
GOWN STRL REUS W/TWL LRG LVL3 (GOWN DISPOSABLE) ×6
GOWN STRL REUS W/TWL XL LVL3 (GOWN DISPOSABLE)
HEMOSTAT POWDER KIT SURGIFOAM (HEMOSTASIS) ×3 IMPLANT
KIT BASIN OR (CUSTOM PROCEDURE TRAY) ×3 IMPLANT
KIT TURNOVER KIT B (KITS) ×3 IMPLANT
NEEDLE HYPO 25X1 1.5 SAFETY (NEEDLE) ×3 IMPLANT
NEEDLE SPNL 20GX3.5 QUINCKE YW (NEEDLE) ×3 IMPLANT
NS IRRIG 1000ML POUR BTL (IV SOLUTION) ×3 IMPLANT
OIL CARTRIDGE MAESTRO DRILL (MISCELLANEOUS) ×3
PACK LAMINECTOMY NEURO (CUSTOM PROCEDURE TRAY) ×3 IMPLANT
PAD ARMBOARD 7.5X6 YLW CONV (MISCELLANEOUS) ×3 IMPLANT
PIN DISTRACTION 14MM (PIN) ×6 IMPLANT
PLATE 16MM (Plate) ×3 IMPLANT
RUBBERBAND STERILE (MISCELLANEOUS) ×6 IMPLANT
SCREW BONE CANN 14 SS SD STR (Screw) ×6 IMPLANT
SCREW FA ST 4.0 16 (Screw) ×6 IMPLANT
SPACER ASSEM CERV LORD 7M (Spacer) ×3 IMPLANT
SPONGE INTESTINAL PEANUT (DISPOSABLE) ×3 IMPLANT
SPONGE SURGIFOAM ABS GEL SZ50 (HEMOSTASIS) ×3 IMPLANT
STRIP CLOSURE SKIN 1/2X4 (GAUZE/BANDAGES/DRESSINGS) ×2 IMPLANT
SUT VIC AB 3-0 SH 8-18 (SUTURE) ×3 IMPLANT
SUT VICRYL 4-0 PS2 18IN ABS (SUTURE) ×3 IMPLANT
TOWEL GREEN STERILE (TOWEL DISPOSABLE) ×3 IMPLANT
TOWEL GREEN STERILE FF (TOWEL DISPOSABLE) ×3 IMPLANT
WATER STERILE IRR 1000ML POUR (IV SOLUTION) ×3 IMPLANT

## 2018-12-14 NOTE — Transfer of Care (Signed)
Immediate Anesthesia Transfer of Care Note  Patient: David Short  Procedure(s) Performed: ANTERIOR CERVICAL DISCECTOMY FUSION CERVICAL FIVE- CERVICAL SIX (N/A Neck)  Patient Location: PACU  Anesthesia Type:General  Level of Consciousness: drowsy, patient cooperative and responds to stimulation  Airway & Oxygen Therapy: Patient Spontanous Breathing  Post-op Assessment: Report given to RN and Post -op Vital signs reviewed and stable  Post vital signs: Reviewed and stable  Last Vitals:  Vitals Value Taken Time  BP 133/97 12/14/18 1214  Temp 36.7 C 12/14/18 1214  Pulse 100 12/14/18 1219  Resp 19 12/14/18 1219  SpO2 91 % 12/14/18 1219  Vitals shown include unvalidated device data.  Last Pain:  Vitals:   12/14/18 1214  TempSrc:   PainSc: 5       Patients Stated Pain Goal: 0 (16/38/46 6599)  Complications: No apparent anesthesia complications

## 2018-12-14 NOTE — Progress Notes (Signed)
Orthopedic Tech Progress Note Patient Details:  [name] 1970-03-27 [id number] Let RN know that they could get an aspen collar from materials Patient ID: David Short, male   DOB: January 24, 1971, 48 y.o.   MRN: [id number]   Janit Pagan 12/14/2018, 1:56 PM

## 2018-12-14 NOTE — Progress Notes (Signed)
Attempted report nurse went to lunch and charge nurse is not on the unit.

## 2018-12-14 NOTE — Anesthesia Preprocedure Evaluation (Signed)
Anesthesia Evaluation  Patient identified by MRN, date of birth, ID band Patient awake    Reviewed: Allergy & Precautions, H&P , NPO status , Patient's Chart, lab work & pertinent test results  Airway Mallampati: II   Neck ROM: limited    Dental   Pulmonary Current Smoker,    breath sounds clear to auscultation       Cardiovascular hypertension,  Rhythm:regular Rate:Normal     Neuro/Psych    GI/Hepatic   Endo/Other    Renal/GU      Musculoskeletal   Abdominal   Peds  Hematology   Anesthesia Other Findings   Reproductive/Obstetrics                             Anesthesia Physical Anesthesia Plan  ASA: II  Anesthesia Plan: General   Post-op Pain Management:    Induction: Intravenous  PONV Risk Score and Plan: 1 and Ondansetron, Dexamethasone, Midazolam and Treatment may vary due to age or medical condition  Airway Management Planned: Oral ETT and Video Laryngoscope Planned  Additional Equipment:   Intra-op Plan:   Post-operative Plan: Extubation in OR  Informed Consent: I have reviewed the patients History and Physical, chart, labs and discussed the procedure including the risks, benefits and alternatives for the proposed anesthesia with the patient or authorized representative who has indicated his/her understanding and acceptance.       Plan Discussed with: CRNA, Anesthesiologist and Surgeon  Anesthesia Plan Comments:         Anesthesia Quick Evaluation

## 2018-12-14 NOTE — Progress Notes (Signed)
[facility] Progress Note  Patient: David Short DUK:[id number]   PCP: Default, Provider, MD DOB: 16-Jan-1971   DOA: 12/12/2018   DOS: 12/14/2018   Date of Service: the patient was seen and examined on 12/14/2018  Chief Complaint  Patient presents with   Neck Pain   Fever   Brief hospital course: Patient is a 48 year old Caucasian male with past medical history significant for IV heroin and cocaine abuse, chronic back pain and prior fractures of left arm and left ankle treated operatively.  Patient was admitted with complaints of neck and back pain.  Work-up done revealed osteomyelitis of C5-C6 spine with a phlegmon.  Patient was transferred to [facility] for further assessment, management and surgery.  [facility] from [facility].   12/14/2018: Patient underwent decompressive anterior cervical discectomy C5-6, anterior cervical arthrodesis C5-6 utilizing a cortico-cancellus bone graft and anterior cervical plating C5-6 utilizing a Alphatec plate.  Patient is stable postop, and is asking for food.   Subjective: No fever or chills.  Patient feels better.  Assessment and Plan: 1.  C5-C6 vertebral osteomyelitis. Concern for meningitis. Bacteremia with Serratia. IVDA with heroin last used 3 weeks ago. Polysubstance abuse with cocaine Back pain and neck pain Presents with above complaint. Underwent CSF examination which showed some pleocytosis. Patient was recommended for admission proceed possible meningitis. Started on IV broad-spectrum IV antibiotics as well as antiviral medication. Blood cultures come back positive for Serratia. MRI brain with and without contrast as well as MRI C-spine with and without contrast was performed which shows evidence of C5-C6 discitis as well as osteomyelitis and ventral phlegmon. Discussed with ID who recommends continue antibiotics. Discussed with neurosurgery who will evaluate the patient for possible neurosurgical intervention but  currently recommends to continue with the antibiotics. Patient will be treated with IV cefepime given concern for AMPc  Resistance Repeat blood cultures ordered. HSV PCR currently pending.  12/14/2018: Patient has just undergone surgery as documented above.  2.  Pain control Flexeril and tramadol. Patient is refusing medications that includes Tylenol for now. 12/14/2018: Pain is better controlled.  3. Tylenol side effect Patient reports itching but no hives. Allergies updated.  4. Accelerated hypertension. Sinus tachycardia. Patient reports that he has significant anxiety and therefore his blood pressure is high. No prior history high blood pressure. We will treat with clonidine and labetalol. UDS negative for cocaine. 12/14/2018: Last blood pressure was 130/90 mmHg.  Continue to optimize.  5.  Polysubstance abuse. Patient shoots heroin 3 times weekly.  As well as cocaine occasionally.   Monitor. Patient claims he uses clean needles. 12/14/2018: Counseled to quit illicit drug use  Diet: Cardiac diet  DVT Prophylaxis: Subcutaneous Lovenox  Advance goals of care discussion: Full code  Family Communication:   Disposition:  Neurosurgery team to advise.  Consultants: Neurosurgery, ID Procedures: LP  Scheduled Meds:  feeding supplement (ENSURE ENLIVE)  237 mL Oral BID BM   fentaNYL       midazolam       senna  1 tablet Oral BID   sodium chloride flush  3 mL Intravenous Q12H   Continuous Infusions:  sodium chloride     0.9 % NaCl with KCl 20 mEq / L 75 mL/hr at 12/14/18 1352   ceFAZolin     ceFEPime (MAXIPIME) IV 2 g (12/14/18 3762)   methocarbamol (ROBAXIN) IV     PRN Meds: diphenhydrAMINE, labetalol, menthol-cetylpyridinium **OR** phenol, methocarbamol **OR** methocarbamol (ROBAXIN) IV, morphine injection, ondansetron **OR** ondansetron (ZOFRAN) IV,  oxyCODONE, senna-docusate, sodium chloride flush, sodium chloride flush Antibiotics: Anti-infectives (From  admission, onward)   Start     Dose/Rate Route Frequency Ordered Stop   12/14/18 1028  bacitracin 50,000 Units in sodium chloride 0.9 % 500 mL irrigation  Status:  Discontinued       As needed 12/14/18 1028 12/14/18 1212   12/14/18 0941  ceFAZolin (ANCEF) 2-4 GM/100ML-% IVPB    Note to Pharmacy: Aquilla Hacker   : cabinet override      12/14/18 0941 12/14/18 2144   12/13/18 2000  ceFEPIme (MAXIPIME) 2 g in sodium chloride 0.9 % 100 mL IVPB  Status:  Discontinued     2 g 200 mL/hr over 30 Minutes Intravenous Every 8 hours 12/13/18 1054 12/13/18 1056   12/13/18 2000  ceFEPIme (MAXIPIME) 2 g in sodium chloride 0.9 % 100 mL IVPB     2 g 200 mL/hr over 30 Minutes Intravenous Every 8 hours 12/13/18 1056     12/13/18 1100  ceFEPIme (MAXIPIME) 2 g in sodium chloride 0.9 % 100 mL IVPB  Status:  Discontinued     2 g 200 mL/hr over 30 Minutes Intravenous STAT 12/13/18 1054 12/13/18 1054   12/13/18 1100  ceFEPIme (MAXIPIME) 2 g in sodium chloride 0.9 % 100 mL IVPB     2 g 200 mL/hr over 30 Minutes Intravenous STAT 12/13/18 1055 12/13/18 1207   12/13/18 0600  cefTRIAXone (ROCEPHIN) 2 g in sodium chloride 0.9 % 100 mL IVPB  Status:  Discontinued     2 g 200 mL/hr over 30 Minutes Intravenous Every 12 hours 12/12/18 1715 12/13/18 1042   12/13/18 0200  vancomycin (VANCOCIN) IVPB 750 mg/150 ml premix  Status:  Discontinued     750 mg 150 mL/hr over 60 Minutes Intravenous Every 8 hours 12/12/18 1740 12/13/18 0930   12/12/18 1745  acyclovir (ZOVIRAX) 635 mg in dextrose 5 % 100 mL IVPB  Status:  Discontinued     10 mg/kg  63.5 kg 112.7 mL/hr over 60 Minutes Intravenous Every 8 hours 12/12/18 1740 12/13/18 1042   12/12/18 1715  vancomycin (VANCOCIN) IVPB 1000 mg/200 mL premix  Status:  Discontinued     1,000 mg 200 mL/hr over 60 Minutes Intravenous  Once 12/12/18 1715 12/12/18 1724   12/12/18 1500  acyclovir (ZOVIRAX) 635 mg in dextrose 5 % 100 mL IVPB     10 mg/kg  63.5 kg 112.7 mL/hr over 60 Minutes  Intravenous  Once 12/12/18 1350 12/12/18 1824   12/12/18 1430  vancomycin (VANCOCIN) 1,250 mg in sodium chloride 0.9 % 250 mL IVPB     1,250 mg 166.7 mL/hr over 90 Minutes Intravenous  Once 12/12/18 1350 12/12/18 1657   12/12/18 1400  cefTRIAXone (ROCEPHIN) 2 g in sodium chloride 0.9 % 100 mL IVPB     2 g 200 mL/hr over 30 Minutes Intravenous  Once 12/12/18 1350 12/12/18 1657       Objective: Physical Exam: Vitals:   12/14/18 1229 12/14/18 1245 12/14/18 1300 12/14/18 1340  BP: (!) 135/93 123/90 (!) 130/98   Pulse: 94 92 94   Resp: 12 15 15    Temp:   98.3 F (36.8 C) 98.2 F (36.8 C)  TempSrc:    Oral  SpO2: 93% 94% 93%   Weight:      Height:        Intake/Output Summary (Last 24 hours) at 12/14/2018 1529 Last data filed at 12/14/2018 1122 Gross per 24 hour  Intake 1580 ml  Output  2095 ml  Net -515 ml   Filed Weights   12/12/18 0835 12/13/18 2235  Weight: 63.5 kg 63.4 kg   General: alert and oriented to time, place, and person.  Patient is not in any distress. Eyes: Pallor.  No jaundice. ENT: Oral Mucosa Clear, moist  Neck: no JVD, no Abnormal Mass Or lumps Cardiovascular: S1 and S2 appreciated. Respiratory: Clear to auscultation. Abdomen: Bowel Sound present, Soft and no tenderness, no hernia Extremities: no Pedal edema, no calf tenderness Neurologic:  Awake and alert.  Moves all extremities.  CBC: Recent Labs  Lab 12/12/18 0920 12/13/18 0522 12/14/18 0808  WBC 7.1 6.0 9.4  NEUTROABS 4.6 4.9 7.3  HGB 11.0* 10.7* 11.5*  HCT 35.4* 34.4* 35.3*  MCV 80.8 80.0 78.6*  PLT 140* 166 185   Basic Metabolic Panel: Recent Labs  Lab 12/12/18 0920 12/13/18 0522 12/14/18 0808  NA 139 140 137  K 3.5 4.4 4.2  CL 104 109 103  CO2 25 21* 24  GLUCOSE 82 175* 90  BUN CREATININE 0.54* 0.45* 0.65  CALCIUM 9.2 9.4 9.8  MG  --   --  1.9    Liver Function Tests: Recent Labs  Lab 12/13/18 0522 12/14/18 0808  AST 14* 17  ALT 15 20  ALKPHOS 76 77   BILITOT 0.3 0.4  PROT 7.3 7.6  ALBUMIN 3.2* 3.3*   No results for input(s): LIPASE, AMYLASE in the last 168 hours. No results for input(s): AMMONIA in the last 168 hours. Coagulation Profile: Recent Labs  Lab 12/14/18 0808  INR 1.0   Cardiac Enzymes: No results for input(s): CKTOTAL, CKMB, CKMBINDEX, TROPONINI in the last 168 hours. BNP (last 3 results) No results for input(s): PROBNP in the last 8760 hours. CBG: No results for input(s): GLUCAP in the last 168 hours. Studies: Ct Cervical Spine Wo Contrast  Result Date: 12/13/2018 CLINICAL DATA:  Neck pain over the last week. Osteomyelitis discitis C5-6 by MRI report. Bilateral arm weakness. EXAM: CT CERVICAL SPINE WITHOUT CONTRAST TECHNIQUE: Multidetector CT imaging of the cervical spine was performed without intravenous contrast. Multiplanar CT image reconstructions were also generated. COMPARISON:  MRI earlier same day FINDINGS: Alignment: Straightening of the normal cervical lordosis. Skull base and vertebrae: Endplate irregularity at the superior endplate of C6 particularly anterior. No other focal bone finding. The inferior endplate at C5 appears intact. Soft tissues and spinal canal: No bony stenosis of the canal or foramina. No soft tissue abnormality definable by noncontrast CT. MRI shows C5-6 ventral epidural enhancing tissue. Disc levels: Disc levels are normal with exception of C5-6. The disc space does not appear frankly narrowed. Inferior endplate of C5 is intact. Superior endplate destruction anteriorly at C6 consistent with the clinical diagnosis of osteomyelitis. However, the disc itself at MRI does not show abnormal T2 signal or contrast enhancement. I also note that the white count is normal. Therefore, I suppose there is some possibility that this could be a pathologic fracture at C6 not related to infection or at least not 2 typical discitis osteomyelitis. Primary osteomyelitis is possible, which brings in 2 consideration  it atypical organisms such as tuberculosis rather than the ordinary staph infection which usually begins in the disc. Upper chest: Emphysema and pulmonary scarring. Other: None IMPRESSION: The only CT finding is superior endplate irregularity and destruction anteriorly at C6. Ventral epidural process at this level is not specifically appreciated by noncontrast CT but clearly shown by MRI. The inferior endplate at C5 is  intact. The C5-6 disc space height is normal. This in combination with the MR findings of absence of T2 signal or enhancement within the C5-6 disc raises the possibility of other processes besides typical staph discitis osteomyelitis. Atypical organisms such as tuberculosis can primarily infect the vertebral body. Tumor not completely excluded in this case. I note that the white count is also normal. Electronically Signed   By: Paulina FusiMark  Shogry M.D.   On: 12/13/2018 20:51     Time spent: 35 minutes  Berton MountSylvester Albina Gosney, MD [facility] 12/14/2018 3:29 PM  To reach On-call, see care teams to locate the attending and reach out to them via [email or web address]. If 7PM-7AM, please contact night-coverage If you still have difficulty reaching the attending provider, please page the [facility] (Director on Call) for [facility] on amion for assistance.

## 2018-12-14 NOTE — Anesthesia Procedure Notes (Signed)
Procedure Name: Intubation Date/Time: 12/14/2018 10:01 AM Performed by: Oletta Lamas, CRNA Pre-anesthesia Checklist: Patient identified, Emergency Drugs available, Suction available and Patient being monitored Patient Re-evaluated:Patient Re-evaluated prior to induction Oxygen Delivery Method: Circle System Utilized Preoxygenation: Pre-oxygenation with 100% oxygen Induction Type: IV induction Ventilation: Mask ventilation without difficulty Laryngoscope Size: Glidescope and 3 Grade View: Grade I Tube type: Oral Tube size: 7.5 mm Number of attempts: 1 Airway Equipment and Method: Stylet and Oral airway Placement Confirmation: ETT inserted through vocal cords under direct vision,  positive ETCO2 and breath sounds checked- equal and bilateral Secured at: 23 cm Tube secured with: Tape Dental Injury: Teeth and Oropharynx as per pre-operative assessment

## 2018-12-14 NOTE — Op Note (Signed)
12/14/2018  12:10 PM  PATIENT:  David Short  48 y.o. 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 Decompressive anterior cervical discectomy C5-6, 2. Anterior cervical arthrodesis C5-6 utilizing a cortico-cancellus bone graft, 3. Anterior cervical plating C5-6 utilizing a Alphatec plate  SURGEON:  Marikay Alar, MD  ASSISTANTS: Verlin Dike, FNP  ANESTHESIA:   General  EBL: 100 ml  Total I/O In: 1000 [I.V.:1000] Out: 425 [Urine:325; Blood:100]  BLOOD ADMINISTERED: none  DRAINS: none  SPECIMEN:  none  INDICATION FOR PROCEDURE: This patient presented with severe neck pain and arm pain. Imaging showed discitis and osteomyelitis at C5-6 with moderate spinal stenosis.  He was placed on cefepime which was culture directed. the patient tried conservative measures without relief. Pain was debilitating. Recommended ACDF with plating. Patient understood the risks, benefits, and alternatives and potential outcomes and wished to proceed.  PROCEDURE DETAILS: Patient was brought to the operating room placed under general endotracheal anesthesia. Patient was placed in the supine position on the operating room table. The neck was prepped with Duraprep and draped in a sterile fashion.   Three cc of local anesthesia was injected and a transverse incision was made on the right side of the neck.  Dissection was carried down thru the subcutaneous tissue and the platysma was  elevated, opened, and undermined with Metzenbaum scissors.  Dissection was then carried out thru an avascular plane leaving the sternocleidomastoid carotid artery and jugular vein laterally and the trachea and esophagus medially. The ventral aspect of the vertebral column was identified and a localizing x-ray was taken. The C5-6 level was identified.  Preoperative imaging showed some destruction of the  anterior superior part of the vertebral body of C6.  This was evident on imaging intraoperatively and therefore we strongly considered a C6 corpectomy.  However, we decided to drill the endplates and determine the apparent quality of the C6 vertebral body bone before making a full decision.  We wanted to consider which construct, ACDF versus C6 corpectomy, at the highest risk of failure of the construct. the longus colli muscles were then elevated and the retractor was placed. The annulus was incised and the disc space entered. Discectomy was performed with micro-curettes and pituitary rongeurs. I then used the high-speed drill to drill the endplates down to the level of the posterior longitudinal ligament. . The operating microscope was draped and brought into the field provided additional magnification, illumination and visualization. Discectomy was continued posteriorly thru the disc space.  The disc was boggy and abnormal.  We did not find liquid pus but instead found phlegmon in the epidural space.  Finding tissue planes was difficult.  However, felt comfortable that we found the posterior longitudinal ligament through the phlegmon.  posterior longitudinal ligament was opened with a nerve hook, and then removed along with the disc and phlegmon, decompressing the spinal canal and thecal sac. We then continued to remove osteophytic overgrowth and disc material and phlegmon decompressing the neural foramina and exiting nerve roots bilaterally. The scope was angled up and down to help decompress and undercut the vertebral bodies. Once the decompression was completed we could pass a nerve hook circumferentially to assure adequate decompression in the midline and in the neural foramina. So by both visualization and palpation we felt we had an adequate decompression of the neural elements.  We looked at and palpated the C6 vertebral body and  felt that it was fairly solid.  We felt that an ACDF probably had a lower  risk of construct failure despite what we knew was probably softer bone and C6 than normal, then the risk of failure of a corpectomy given the significant lordosis of his spine and the slope of the C7 endplate. we then measured the height of the intravertebral disc space and selected a 7 millimeter cortical cancellus allograft. It was then gently positioned in the intravertebral disc space(s) and countersunk. I then used a 16 mm Alphatec plate and placed 16 mm fixed angle screws into the vertebral bodies of each level and locked them into position. The wound was irrigated with bacitracin solution, checked for hemostasis which was established and confirmed. Once meticulous hemostasis was achieved, we then proceeded with closure. The platysma was closed with interrupted 3-0 undyed Vicryl suture, the subcuticular layer was closed with interrupted 3-0 undyed Vicryl suture. The skin edges were approximated with steristrips. The drapes were removed. A sterile dressing was applied. The patient was then awakened from general anesthesia and transferred to the recovery room in stable condition. At the end of the procedure all sponge, needle and instrument counts were correct.   PLAN OF CARE: Admit to inpatient   PATIENT DISPOSITION:  PACU - hemodynamically stable.   Delay start of Pharmacological VTE agent (>24hrs) due to surgical blood loss or risk of bleeding:  yes

## 2018-12-14 NOTE — Progress Notes (Signed)
Patient ID: David Short, male   DOB: 1970/05/16, 48 y.o.   MRN: [id number] Patient seen and examined.  No change in physical exam.  May have some mild weakness in the triceps and finger extensors though some of this is probably related to pain.  Continues to complain of severe neck pain.  Complains of radicular pain.  Plan on ACDF with plating at C5-6 today.  He understands the risk of the surgery include but are not limited to bleeding, infection, esophageal injury, tracheal injury, recurrent laryngeal nerve injury, hoarseness, swelling, artery injury, stroke, pseudoarthrosis (greatly increased), failure of the construct (increased), need for further surgery, adjacent level disease, spinal cord injury, nerve root injury, CSF leak, numbness, weakness, paralysis, lack of relief of symptoms, worsening symptoms, and anesthesia risk including DVT pneumonia MI and death.

## 2018-12-14 NOTE — Progress Notes (Signed)
While inserting PIV on this patient, PT heart rate suddenly drop to 41. Pt complains feeling hot and started to sweat. Patient skin is cold and clammy, pt turns pale. BP 87/60. This took 5 mins before All vitals signs come back high.  PT denies chest pain, no lighthededness, alertx4.Marland Kitchen Bodenhiemer was made aware. Will monitor.

## 2018-12-15 ENCOUNTER — Encounter (HOSPITAL_COMMUNITY): Payer: Self-pay | Admitting: Neurological Surgery

## 2018-12-15 DIAGNOSIS — G8929 Other chronic pain: Secondary | ICD-10-CM

## 2018-12-15 DIAGNOSIS — B9689 Other specified bacterial agents as the cause of diseases classified elsewhere: Secondary | ICD-10-CM

## 2018-12-15 DIAGNOSIS — Z981 Arthrodesis status: Secondary | ICD-10-CM

## 2018-12-15 DIAGNOSIS — M542 Cervicalgia: Secondary | ICD-10-CM | POA: Diagnosis present

## 2018-12-15 DIAGNOSIS — E440 Moderate protein-calorie malnutrition: Secondary | ICD-10-CM | POA: Diagnosis present

## 2018-12-15 DIAGNOSIS — R7881 Bacteremia: Secondary | ICD-10-CM

## 2018-12-15 DIAGNOSIS — F17210 Nicotine dependence, cigarettes, uncomplicated: Secondary | ICD-10-CM

## 2018-12-15 DIAGNOSIS — M4642 Discitis, unspecified, cervical region: Secondary | ICD-10-CM

## 2018-12-15 DIAGNOSIS — M4622 Osteomyelitis of vertebra, cervical region: Secondary | ICD-10-CM

## 2018-12-15 DIAGNOSIS — Z888 Allergy status to other drugs, medicaments and biological substances status: Secondary | ICD-10-CM

## 2018-12-15 DIAGNOSIS — G061 Intraspinal abscess and granuloma: Secondary | ICD-10-CM

## 2018-12-15 MED ORDER — OXYCODONE HCL 5 MG PO TABS
5.0000 mg | ORAL_TABLET | ORAL | Status: DC | PRN
  Administered 2018-12-15 – 2018-12-16 (×6): 5 mg via ORAL
  Filled 2018-12-15 (×6): qty 1

## 2018-12-15 NOTE — Progress Notes (Signed)
Subjective: Patient reports pain is much better today. Most of his soreness resides in the back of his neck. Denies any pain down down his arms. Feeling a lot better  Objective: Vital signs in last 24 hours: Temp:  [97.5 F (36.4 C)-98.7 F (37.1 C)] 98.7 F (37.1 C) (09/28 0817) Pulse Rate:  [92-116] 93 (09/28 0817) Resp:  [12-27] 19 (09/28 0817) BP: (117-138)/(82-98) 117/96 (09/28 0817) SpO2:  [93 %-95 %] 94 % (09/28 0817) Weight:  [67.5 kg] 67.5 kg (09/28 0500)  Intake/Output from previous day: 09/27 0701 - 09/28 0700 In: 2671 [P.O.:459; I.V.:1000] Out: 1375 [Urine:1275; Blood:100] Intake/Output this shift: No intake/output data recorded.  Neurologic: Grossly normal  Lab Results: Lab Results  Component Value Date   WBC 9.4 12/14/2018   HGB 11.5 (L) 12/14/2018   HCT 35.3 (L) 12/14/2018   MCV 78.6 (L) 12/14/2018   PLT 185 12/14/2018   Lab Results  Component Value Date   INR 1.0 12/14/2018   BMET Lab Results  Component Value Date   NA 137 12/14/2018   K 4.2 12/14/2018   CL 103 12/14/2018   CO2 24 12/14/2018   GLUCOSE 90 12/14/2018   BUN 15 12/14/2018   CREATININE 0.65 12/14/2018   CALCIUM 9.8 12/14/2018    Studies/Results: Dg Cervical Spine 2-3 Views  Result Date: 12/14/2018 CLINICAL DATA:  ACDF EXAM: DG C-ARM 1-60 MIN; CERVICAL SPINE - 2-3 VIEW CONTRAST:  None FLUOROSCOPY TIME:  Fluoroscopy Time:  27 seconds Number of Acquired Spot Images: 1 COMPARISON:  MRI 12/13/2018 FINDINGS: Single low resolution intraoperative spot view of the cervical spine. Total fluoroscopy time was 27 seconds. The images demonstrate anterior plate and screw fixation at C5-C6 with interbody device. IMPRESSION: Intraoperative fluoroscopic assistance provided during cervical spine surgery Electronically Signed   By: Donavan Foil M.D.   On: 12/14/2018 16:48   Ct Cervical Spine Wo Contrast  Result Date: 12/13/2018 CLINICAL DATA:  Neck pain over the last week. 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 Bilateral arm weakness. EXAM: CT CERVICAL SPINE WITHOUT CONTRAST TECHNIQUE: Multidetector CT imaging of the cervical spine was performed without intravenous contrast. Multiplanar CT image reconstructions were also generated. COMPARISON:  MRI earlier same day FINDINGS: Alignment: Straightening of the normal cervical lordosis. Skull base and vertebrae: Endplate irregularity at the superior endplate of C6 particularly anterior. No other focal bone finding. The inferior endplate at C5 appears intact. Soft tissues and spinal canal: No bony stenosis of the canal or foramina. No soft tissue abnormality definable by noncontrast CT. MRI shows C5-6 ventral epidural enhancing tissue. Disc levels: Disc levels are normal with exception of C5-6. The disc space does not appear frankly narrowed. Inferior endplate of C5 is intact. Superior endplate destruction anteriorly at C6 consistent with the clinical diagnosis of osteomyelitis. However, the disc itself at MRI does not show abnormal T2 signal or contrast enhancement. I also note that the white count is normal. Therefore, I suppose there is some possibility that this could be a pathologic fracture at C6 not related to infection or at least not 2 typical discitis osteomyelitis. Primary osteomyelitis is possible, which brings in 2 consideration it atypical organisms such as tuberculosis rather than the ordinary staph infection which usually begins in the disc. Upper chest: Emphysema and pulmonary scarring. Other: None IMPRESSION: The only CT finding is superior endplate irregularity and destruction anteriorly at C6. Ventral epidural process at this level is not specifically appreciated by noncontrast CT but clearly shown by MRI. The inferior endplate  at C5 is intact. The C5-6 disc space height is normal. This in combination with the MR findings of absence of T2 signal or enhancement within the C5-6 disc raises the possibility of other processes besides typical staph  discitis osteomyelitis. Atypical organisms such as tuberculosis can primarily infect the vertebral body. Tumor not completely excluded in this case. I note that the white count is also normal. Electronically Signed   By: Paulina Fusi M.D.   On: 12/13/2018 20:51   Mr Laqueta Jean VZ Contrast  Result Date: 12/13/2018 CLINICAL DATA:  Neck pain.  Bacteremia and fever EXAM: MRI HEAD WITHOUT AND WITH CONTRAST MRI CERVICAL SPINE WITHOUT AND WITH CONTRAST TECHNIQUE: Multiplanar, multiecho pulse sequences of the brain and surrounding structures, and cervical spine, to include the craniocervical junction and cervicothoracic junction, were obtained without and with intravenous contrast. CONTRAST:  5.76mL GADAVIST GADOBUTROL 1 MMOL/ML IV SOLN COMPARISON:  None. FINDINGS: MRI HEAD FINDINGS Brain: No acute infarction, hemorrhage, hydrocephalus, extra-axial collection or mass lesion. Normal white matter Normal enhancement postcontrast administration. Vascular: Normal arterial flow voids. Skull and upper cervical spine: Negative Sinuses/Orbits: Mucosal edema paranasal sinuses.  Normal orbit Other: None MRI CERVICAL SPINE FINDINGS Alignment: Normal Vertebrae: Bone marrow edema C5 and C6 vertebral bodies with enhancement throughout the C6 vertebral body. Mild irregularity superior endplate of C6 vertebral body. Ventral epidural soft tissue thickening and enhancement. Small nonenhancing soft tissue in the ventral epidural space of the disc space level of C5-6 may represent a small abscess. Findings most compatible with discitis and osteomyelitis given the history. Negative for fracture Cord: Normal spinal cord signal. Spinal cord evaluation limited by motion. Posterior Fossa, vertebral arteries, paraspinal tissues: Negative Disc levels: C2-3: Negative C3-4: Negative C4-5: Negative C5-6: Bone marrow edema C5 and C6 vertebral bodies with enhancement of the C6 vertebral body. Early endplate erosion superior endplate of C6. Ventral epidural  soft tissue thickening behind C5 and C6. Nonenhancing component in the ventral epidural space may represent a small 3 mm abscess. This process is causing cord flattening and moderate spinal stenosis. There is also foraminal narrowing bilaterally due to soft tissue enhancement and thickening. C6-7: Negative C7-T1: Negative IMPRESSION: 1. Negative MRI brain with contrast 2. Findings compatible with discitis and osteomyelitis at C5-6. Ventral epidural phlegmon and small 3 mm ventral epidural abscess causing moderate spinal stenosis. 3. These results were called by telephone at the time of interpretation on 12/13/2018 at 1:56 pm to provider [facility] PATEL , who verbally acknowledged these results. Electronically Signed   By: Marlan Palau M.D.   On: 12/13/2018 13:57   Mr Cervical Spine W Wo Contrast  Result Date: 12/13/2018 CLINICAL DATA:  Neck pain.  Bacteremia and fever EXAM: MRI HEAD WITHOUT AND WITH CONTRAST MRI CERVICAL SPINE WITHOUT AND WITH CONTRAST TECHNIQUE: Multiplanar, multiecho pulse sequences of the brain and surrounding structures, and cervical spine, to include the craniocervical junction and cervicothoracic junction, were obtained without and with intravenous contrast. CONTRAST:  5.103mL GADAVIST GADOBUTROL 1 MMOL/ML IV SOLN COMPARISON:  None. FINDINGS: MRI HEAD FINDINGS Brain: No acute infarction, hemorrhage, hydrocephalus, extra-axial collection or mass lesion. Normal white matter Normal enhancement postcontrast administration. Vascular: Normal arterial flow voids. Skull and upper cervical spine: Negative Sinuses/Orbits: Mucosal edema paranasal sinuses.  Normal orbit Other: None MRI CERVICAL SPINE FINDINGS Alignment: Normal Vertebrae: Bone marrow edema C5 and C6 vertebral bodies with enhancement throughout the C6 vertebral body. Mild irregularity superior endplate of C6 vertebral body. Ventral epidural soft tissue thickening and enhancement. Small nonenhancing  soft tissue in the ventral epidural space  of the disc space level of C5-6 may represent a small abscess. Findings most compatible with discitis and osteomyelitis given the history. Negative for fracture Cord: Normal spinal cord signal. Spinal cord evaluation limited by motion. Posterior Fossa, vertebral arteries, paraspinal tissues: Negative Disc levels: C2-3: Negative C3-4: Negative C4-5: Negative C5-6: Bone marrow edema C5 and C6 vertebral bodies with enhancement of the C6 vertebral body. Early endplate erosion superior endplate of C6. Ventral epidural soft tissue thickening behind C5 and C6. Nonenhancing component in the ventral epidural space may represent a small 3 mm abscess. This process is causing cord flattening and moderate spinal stenosis. There is also foraminal narrowing bilaterally due to soft tissue enhancement and thickening. C6-7: Negative C7-T1: Negative IMPRESSION: 1. Negative MRI brain with contrast 2. Findings compatible with discitis and osteomyelitis at C5-6. Ventral epidural phlegmon and small 3 mm ventral epidural abscess causing moderate spinal stenosis. 3. These results were called by telephone at the time of interpretation on 12/13/2018 at 1:56 pm to provider [facility] PAT[facility] , who verbally acknowledged these results. Electronically Signed   By: Marlan Palauharles  Clark M.D.   On: 12/13/2018 13:57   Dg C-arm 1-60 Min  Result Date: 12/14/2018 CLINICAL DATA:  ACDF EXAM: DG C-ARM 1-60 MIN; CERVICAL SPINE - 2-3 VIEW CONTRAST:  None FLUOROSCOPY TIME:  Fluoroscopy Time:  27 seconds Number of Acquired Spot Images: 1 COMPARISON:  MRI 12/13/2018 FINDINGS: Single low resolution intraoperative spot view of the cervical spine. Total fluoroscopy time was 27 seconds. The images demonstrate anterior plate and screw fixation at C5-C6 with interbody device. IMPRESSION: Intraoperative fluoroscopic assistance provided during cervical spine surgery Electronically Signed   By: Jasmine PangKim  Fujinaga M.D.   On: 12/14/2018 16:48    Assessment/Plan: S/p one level acdf  for epidural abscess. Doing really well. Continue therapies and IV abx per ID.    LOS: 3 days    Tiana LoftKimberly Hannah Robyne Matar 12/15/2018, 10:04 AM

## 2018-12-15 NOTE — Progress Notes (Signed)
Occupational Therapy Evaluation Patient Details Name: FYNN VANBLARCOM MRN: [id number] DOB: 17-Jul-1970 Today's Date: 12/15/2018    History of Present Illness 48 year old Caucasian male with past medical history significant for IV heroin and cocaine abuse, chronic back pain and prior fractures of left arm and left ankle treated operatively.  Patient was admitted with complaints of neck and back pain.  Work-up done revealed osteomyelitis of C5-C6 spine with a phlegmon.  He underwent ACDF 12/14/18.   Clinical Impression   PTA, pt was independent for ADLs and IADLs, and is currently incarcerated. Pt currently presents with decreased AROM of BUE, knowledge of precautions, and increased pain. Initiated education regarding precautions and compensatory strategies for grooming and LB ADLs. Pt performed functional mobility, toileting, grooming, and oral care with supervision and VCs for compensatory strategies and maintaining precautions. Pt would benefit from continued OT services acutely to provide education for brace management and compensatory strategies for ADLs. Recommend dc to prison with no OT follow up once medically stable per MD.     Follow Up Recommendations  No OT follow up    Equipment Recommendations  None recommended by OT    Recommendations for Other Services PT consult     Precautions / Restrictions Precautions Precautions: Cervical Precaution Booklet Issued: No Precaution Comments: Recalled 0/3 precautions. Reviewed 3/3 pracutions Required Braces or Orthoses: Cervical Brace Cervical Brace: Hard collar;At all times Restrictions Weight Bearing Restrictions: No      Mobility Bed Mobility Overal bed mobility: Modified Independent Bed Mobility: Rolling;Sidelying to Sit Rolling: Modified independent (Device/Increase time) Sidelying to sit: Supervision;HOB elevated       General bed mobility comments: required multiple cues for using compensatory  stragies  Transfers Overall transfer level: Needs assistance Equipment used: None Transfers: Sit to/from Stand Sit to Stand: Supervision Stand pivot transfers: Min guard       General transfer comment: supervision for safety    Balance Overall balance assessment: Mild deficits observed, not formally tested                                         ADL either performed or assessed with clinical judgement   ADL Overall ADL's : Needs assistance/impaired Eating/Feeding: Independent;Sitting   Grooming: Wash/dry hands;Oral care;Supervision/safety;Cueing for compensatory techniques;Standing Grooming Details (indicate cue type and reason): Provided education regarding compensatory strategies for oral care. Pt performed with supervision and VCs for maintaining precautions Upper Body Bathing: Supervision/ safety;Cueing for safety;Sitting   Lower Body Bathing: Supervison/ safety;Sit to/from stand   Upper Body Dressing : Supervision/safety;Cueing for compensatory techniques;Sitting   Lower Body Dressing: Supervision/safety;Sit to/from stand Lower Body Dressing Details (indicate cue type and reason): Initiated education for LB dressing. Toilet Transfer: Supervision/safety;Ambulation;Regular Toilet   Toileting- Water quality scientist and Hygiene: Supervision/safety;Sit to/from stand       Functional mobility during ADLs: Supervision/safety General ADL Comments: Provided education for compensatory techniques. Requires supervision for safety     Vision         Perception     Praxis      Pertinent Vitals/Pain Pain Assessment: No/denies pain Faces Pain Scale: Hurts little more Pain Location: sx site Pain Descriptors / Indicators: Discomfort;Sore Pain Intervention(s): Monitored during session     Hand Dominance Right   Extremity/Trunk Assessment Upper Extremity Assessment Upper Extremity Assessment: Overall WFL for tasks assessed   Lower Extremity  Assessment Lower Extremity Assessment: Defer to PT evaluation  Cervical / Trunk Assessment Cervical / Trunk Assessment: Other exceptions Cervical / Trunk Exceptions: s/p ACDF   Communication Communication Communication: No difficulties   Cognition Arousal/Alertness: Awake/alert Behavior During Therapy: WFL for tasks assessed/performed Overall Cognitive Status: Within Functional Limits for tasks assessed                                     General Comments  Mobilized on RA with SpO2 94%.    Exercises     Shoulder Instructions      Home Living Family/patient expects to be discharged to:: [facility]/Prison Living Arrangements: Other (Comment)(prison)                               Additional Comments: Per Sheriff on duty, Medical side of the facility has walk in showers with chairs, and is able to get equipment if needed. Also able to get hosptial bed in room if needed      Prior Functioning/Environment Level of Independence: Independent                 OT Problem List: Decreased range of motion;Impaired balance (sitting and/or standing);Decreased safety awareness;Decreased knowledge of use of DME or AE;Decreased knowledge of precautions;Pain      OT Treatment/Interventions: Self-care/ADL training;DME and/or AE instruction;Balance training    OT Goals(Current goals can be found in the care plan section) Acute Rehab OT Goals Patient Stated Goal: decrease pain OT Goal Formulation: With patient Time For Goal Achievement: 12/29/18 Potential to Achieve Goals: Good  OT Frequency: Min 2X/week   Barriers to D/C:            Co-evaluation              AM-PAC OT "6 Clicks" Daily Activity     Outcome Measure Help from another person eating meals?: None Help from another person taking care of personal grooming?: None Help from another person toileting, which includes using toliet, bedpan, or urinal?: None Help from another person  bathing (including washing, rinsing, drying)?: None Help from another person to put on and taking off regular upper body clothing?: None Help from another person to put on and taking off regular lower body clothing?: None 6 Click Score: 24   End of Session Equipment Utilized During Treatment: Cervical collar Nurse Communication: Mobility status  Activity Tolerance: Patient tolerated treatment well Patient left: in bed;with call bell/phone within reach;Other (comment)(sheriff present)  OT Visit Diagnosis: Pain;Other abnormalities of gait and mobility (R26.89) Pain - part of body: (neck)                Time: 1135-1150 OT Time Calculation (min): 15 min Charges:  OT General Charges $OT Visit: 1 Visit OT Evaluation $OT Eval Low Complexity: 1 Low  Sandrea Hammond, OT Student  Sandrea Hammond 12/15/2018, 1:10 PM

## 2018-12-15 NOTE — Evaluation (Signed)
Physical Therapy Evaluation Patient Details Name: JAXN CHIQUITO MRN: [id number] DOB: 12-Nov-1970 Today's Date: 12/15/2018   History of Present Illness  48 year old Caucasian male with past medical history significant for IV heroin and cocaine abuse, chronic back pain and prior fractures of left arm and left ankle treated operatively.  Patient was admitted with complaints of neck and back pain.  Work-up done revealed osteomyelitis of C5-C6 spine with a phlegmon.  He underwent ACDF 12/14/18.    Clinical Impression  Pt admitted with above diagnosis. PTA pt independent. Pt is currently incarcerated. On eval, he required supervision bed mobility, min guard assist transfers and min guard assist ambulation 150 feet pushing IV pole. Pt currently with functional limitations due to the deficits listed below (see PT Problem List). Pt will benefit from skilled PT to increase their independence and safety with mobility to allow discharge to the venue listed below.  PT to follow acutely. No follow up services indicated.     Follow Up Recommendations No PT follow up    Equipment Recommendations  None recommended by PT    Recommendations for Other Services       Precautions / Restrictions Precautions Precautions: Cervical Precaution Comments: Educated pt on cervical precautions. Required Braces or Orthoses: Cervical Brace Cervical Brace: Hard collar;At all times Restrictions Weight Bearing Restrictions: No      Mobility  Bed Mobility Overal bed mobility: Needs Assistance Bed Mobility: Rolling;Sidelying to Sit Rolling: Modified independent (Device/Increase time) Sidelying to sit: Supervision;HOB elevated       General bed mobility comments: +rail, supervision for safety, cues for sequencing  Transfers Overall transfer level: Needs assistance Equipment used: None Transfers: Sit to/from [employer] Transfers Sit to Stand: Min guard Stand pivot transfers: Min guard        General transfer comment: min guard for safety/lines  Ambulation/Gait Ambulation/Gait assistance: Min guard Gait Distance (Feet): 150 Feet Assistive device: IV Pole Gait Pattern/deviations: Step-through pattern;Decreased stride length Gait velocity: decreased Gait velocity interpretation: 1.31 - 2.62 ft/sec, indicative of limited community ambulator General Gait Details: Gait pattern affected by ankle shackles. Pt tripping over shackle chain several times but able to maintain his balance using IV pole.  Stairs            Wheelchair Mobility    Modified Rankin (Stroke Patients Only)       Balance Overall balance assessment: Mild deficits observed, not formally tested(difficult to assess due to ankle shackles)                                           Pertinent Vitals/Pain Pain Assessment: Faces Faces Pain Scale: Hurts little more Pain Location: sx site Pain Descriptors / Indicators: Discomfort;Sore Pain Intervention(s): Monitored during session;Repositioned    Home Living Family/patient expects to be discharged to:: [facility]/Prison                      Prior Function Level of Independence: Independent               Hand Dominance        Extremity/Trunk Assessment   Upper Extremity Assessment Upper Extremity Assessment: Defer to OT evaluation    Lower Extremity Assessment Lower Extremity Assessment: Overall WFL for tasks assessed    Cervical / Trunk Assessment Cervical / Trunk Assessment: Other exceptions Cervical / Trunk Exceptions: s/p ACDF  Communication  Communication: No difficulties  Cognition Arousal/Alertness: Awake/alert Behavior During Therapy: WFL for tasks assessed/performed Overall Cognitive Status: Within Functional Limits for tasks assessed                                        General Comments General comments (skin integrity, edema, etc.): Mobilized on RA with SpO2 94%.     Exercises     Assessment/Plan    PT Assessment Patient needs continued PT services  PT Problem List Decreased mobility;Decreased activity tolerance;Decreased balance;Pain       PT Treatment Interventions Therapeutic activities;Gait training;Therapeutic exercise;Patient/family education;Stair training;Balance training;Functional mobility training    PT Goals (Current goals can be found in the Care Plan section)  Acute Rehab PT Goals Patient Stated Goal: decrease pain PT Goal Formulation: With patient Time For Goal Achievement: 12/29/18 Potential to Achieve Goals: Good    Frequency Min 5X/week   Barriers to discharge        Co-evaluation               AM-PAC PT "6 Clicks" Mobility  Outcome Measure Help needed turning from your back to your side while in a flat bed without using bedrails?: None Help needed moving from lying on your back to sitting on the side of a flat bed without using bedrails?: A Little Help needed moving to and from a bed to a chair (including a wheelchair)?: A Little Help needed standing up from a chair using your arms (e.g., wheelchair or bedside chair)?: None Help needed to walk in hospital room?: A Little Help needed climbing 3-5 steps with a railing? : A Little 6 Click Score: 20    End of Session Equipment Utilized During Treatment: Gait belt;Cervical collar Activity Tolerance: Patient tolerated treatment well Patient left: in chair;with call bell/phone within reach;Other (comment)(police officer present) Nurse Communication: Mobility status PT Visit Diagnosis: Difficulty in walking, not elsewhere classified (R26.2);Pain    Time: 0902-0922 PT Time Calculation (min) (ACUTE ONLY): 20 min   Charges:   PT Evaluation $PT Eval Moderate Complexity: 1 Mod          Aida Raider, PT  Office # [phone] Pager [phone]   Ilda Foil 12/15/2018, 10:05 AM

## 2018-12-15 NOTE — Consult Note (Signed)
[facility]       Reason for Consult: osteomyelitis and epidural abscess   Referring Physician: Dr. Marthenia Rolling  Principal Problem:   Aseptic meningitis Active Problems:   Accelerated hypertension   Sinus tachycardia   mild Thrombocytopenia (HCC)   S/P cervical spinal fusion   Neck pain   . feeding supplement (ENSURE ENLIVE)  237 mL Oral BID BM  . senna  1 tablet Oral BID  . 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  Culture pending from the OR but I consider the Serratia the organism.   He will need a prolonged treatment course and oral levaquin will be optimal for him.    Antibiotics: Cefepime day 3 Total antibiotics day 4  HPI: NEVEN FINA is a 48 y.o. male with chronic back pain and came in with new neck pain for about 6 days, fever and found on MRI to have discitis/osteomyelitis and epidural abscess in C5-6 region.  Debrided by Dr. Ronnald Ramp yesterday with anterior decompressive discectomy, bone grafting and anterior cervical plating.  Operative cultures with ngtd.  Had been on antibiotics.  No fever since admission.  CT also done and noted an absence of T2 signal in the C5-6 disc and to consider other organisms vs tumor as well.  No associated n/v/d.  No rash.     Review of Systems:  Constitutional: negative for fevers and chills Gastrointestinal: negative for nausea and diarrhea Integument/breast: negative for rash Musculoskeletal: negative for myalgias and arthralgias All other systems reviewed and are negative    History reviewed. No pertinent past medical history.  Social History   Tobacco Use  . Smoking status: Current Every Day Smoker    Packs/day: 1.00    Types:  Cigarettes  . Smokeless tobacco: Never Used  Substance Use Topics  . Alcohol use: No  . Drug use: Not Currently    Types: IV    Comment: opiates    FMH: + cardiac disease  Allergies  Allergen Reactions  . Tylenol [Acetaminophen] Hives    Physical Exam: Constitutional: in no apparent distress  Vitals:   12/15/18 0407 12/15/18 0817  BP: 126/88 (!) 117/96  Pulse: 98 93  Resp: 20 19  Temp: 97.6 F (36.4 C) 98.7 F (37.1 C)  SpO2: 93% 94%   EYES: anicteric ENMT: neck brace Cardiovascular: Cor RRR Respiratory: CTA B; normal respiratory effort GI: Bowel sounds are normal, liver is not enlarged, spleen is not enlarged Skin: negatives: no rash Neuro: non-focal  Lab Results  Component Value Date   WBC 9.4 12/14/2018   HGB 11.5 (L) 12/14/2018   HCT 35.3 (L) 12/14/2018   MCV 78.6 (L) 12/14/2018   PLT 185 12/14/2018    Lab Results  Component Value Date   CREATININE 0.65 12/14/2018   BUN 15 12/14/2018   NA 137 12/14/2018   K 4.2 12/14/2018   CL 103 12/14/2018   CO2 24 12/14/2018    Lab Results  Component Value Date   ALT 20 12/14/2018   AST 17 12/14/2018   ALKPHOS 77 12/14/2018     Microbiology: Recent Results (from the past 240 hour(s))  Blood culture (routine x 2)     Status: Abnormal (Preliminary result)   Collection Time:  12/12/18  9:20 AM   Specimen: BLOOD  Result Value Ref Range Status   Specimen Description   Final    BLOOD LEFT ANTECUBITAL Performed at [facility], [address] [place], [place] [address]    Special Requests   Final    BOTTLES DRAWN AEROBIC AND ANAEROBIC Blood Culture results may not be optimal due to an excessive volume of blood received in culture bottles Performed at [facility], [address] [place], [place] [address]    Culture  Setup Time   Final    GRAM NEGATIVE RODS IN BOTH AEROBIC AND ANAEROBIC BOTTLES CRITICAL RESULT CALLED TO, READ BACK BY AND VERIFIED WITH: Jacklynn BuePHARMD Janine LimboM. SWAYNE  [phone] FCP Performed at [facility], [address] [place], [place] [address]    Culture SERRATIA MARCESCENS (A)  Final   Report Status PENDING  Incomplete   Organism ID, Bacteria SERRATIA MARCESCENS  Final      Susceptibility   Serratia marcescens - MIC*    CEFAZOLIN >=64 RESISTANT Resistant     CEFEPIME <=1 SENSITIVE Sensitive     CEFTAZIDIME <=1 SENSITIVE Sensitive     CEFTRIAXONE <=1 SENSITIVE Sensitive     CIPROFLOXACIN <=0.25 SENSITIVE Sensitive     GENTAMICIN <=1 SENSITIVE Sensitive     TRIMETH/SULFA <=20 SENSITIVE Sensitive     * SERRATIA MARCESCENS  Blood culture (routine x 2)     Status: None (Preliminary result)   Collection Time: 12/12/18  9:20 AM   Specimen: BLOOD LEFT HAND  Result Value Ref Range Status   Specimen Description   Final    BLOOD LEFT HAND Performed at [facility], [address] [place], [place] [address]    Special Requests   Final    BOTTLES DRAWN AEROBIC ONLY Blood Culture results may not be optimal due to an inadequate volume of blood received in culture bottles Performed at [facility], [address] [place], [place] [address]    Culture   Final    NO GROWTH 2 DAYS Performed at [facility], [address] [place], [place] [address]    Report Status PENDING  Incomplete  SARS CORONAVIRUS 2 (TAT 6-24 HRS) Nasopharyngeal Nasopharyngeal Swab     Status: None   Collection Time: 12/12/18  9:20 AM   Specimen: Nasopharyngeal Swab  Result Value Ref Range Status   SARS Coronavirus 2 NEGATIVE NEGATIVE Final    Comment: (NOTE) SARS-CoV-2 target nucleic acids are NOT DETECTED. The SARS-CoV-2 RNA is generally detectable in upper and lower respiratory specimens during the acute phase of infection. Negative results do not preclude SARS-CoV-2 infection, do not rule out co-infections with other pathogens, and should not be used as the sole basis for treatment or other patient management decisions. Negative  results must be combined with clinical observations, patient history, and epidemiological information. The expected result is Negative. Fact Sheet for Patients: HairSlick.nohttps://www.fda.gov/media/138098/download Fact Sheet for Healthcare Providers: quierodirigir.comhttps://www.fda.gov/media/138095/download This test is not yet approved or cleared by the [place] FDA and  has been authorized for detection and/or diagnosis of SARS-CoV-2 by FDA under an Emergency Use Authorization (EUA). This EUA will remain  in effect (meaning this test can be used) for the duration of the COVID-19 declaration under Section 56 4(b)(1) of the Act, 21 U.S.C. section 360bbb-3(b)(1), unless the authorization is terminated or revoked sooner. Performed at [facility], [address] [place], [place] [address]   Blood  Culture ID Panel (Reflexed)     Status: Abnormal   Collection Time: 12/12/18  9:20 AM  Result Value Ref Range Status   Enterococcus species NOT DETECTED NOT DETECTED Final   Listeria monocytogenes NOT DETECTED NOT DETECTED Final   Staphylococcus species NOT DETECTED NOT DETECTED Final   Staphylococcus aureus (BCID) NOT DETECTED NOT DETECTED Final   Streptococcus species NOT DETECTED NOT DETECTED Final   Streptococcus agalactiae NOT DETECTED NOT DETECTED Final   Streptococcus pneumoniae NOT DETECTED NOT DETECTED Final   Streptococcus pyogenes NOT DETECTED NOT DETECTED Final   Acinetobacter baumannii NOT DETECTED NOT DETECTED Final   Enterobacteriaceae species DETECTED (A) NOT DETECTED Final    Comment: Enterobacteriaceae represent a large family of gram-negative bacteria, not a single organism. CRITICAL RESULT CALLED TO, READ BACK BY AND VERIFIED WITH: PHARMD Janine Limbo [phone] FCP    Enterobacter cloacae complex NOT DETECTED NOT DETECTED Final   Escherichia coli NOT DETECTED NOT DETECTED Final   Klebsiella oxytoca NOT DETECTED NOT DETECTED Final   Klebsiella pneumoniae NOT DETECTED NOT DETECTED Final    Proteus species NOT DETECTED NOT DETECTED Final   Serratia marcescens DETECTED (A) NOT DETECTED Final    Comment: CRITICAL RESULT CALLED TO, READ BACK BY AND VERIFIED WITH: PHARMD Janine Limbo [phone] FCP    Carbapenem resistance NOT DETECTED NOT DETECTED Final   Haemophilus influenzae NOT DETECTED NOT DETECTED Final   Neisseria meningitidis NOT DETECTED NOT DETECTED Final   Pseudomonas aeruginosa NOT DETECTED NOT DETECTED Final   Candida albicans NOT DETECTED NOT DETECTED Final   Candida glabrata NOT DETECTED NOT DETECTED Final   Candida krusei NOT DETECTED NOT DETECTED Final   Candida parapsilosis NOT DETECTED NOT DETECTED Final   Candida tropicalis NOT DETECTED NOT DETECTED Final    Comment: Performed at [facility], [address], [place], [place] [address]  CSF culture     Status: None (Preliminary result)   Collection Time: 12/12/18 12:21 PM   Specimen: Back; Cerebrospinal Fluid  Result Value Ref Range Status   Specimen Description   Final    BACK CSF Performed at [facility], [address], [place], [place] [address]    Special Requests   Final    NONE Performed at [facility], [address], [place], [place] [address]    Gram Stain   Final    WBC PRESENT, PREDOMINANTLY MONONUCLEAR NO ORGANISMS SEEN CYTOSPIN SMEAR Gram Stain Report Called to,Read Back By and Verified With: CALLED TO Katha Cabal, RN ON 12/12/18 @ 1317 BY LE Performed at [facility], [address], [place], [place] [address]    Culture   Final    NO GROWTH 2 DAYS Performed at [facility], [address] [place], [place] [address]    Report Status PENDING  Incomplete  MRSA PCR Screening     Status: None   Collection Time: 12/12/18  9:23 PM   Specimen: Nasal Mucosa; Nasopharyngeal  Result Value Ref Range Status   MRSA by PCR NEGATIVE NEGATIVE Final    Comment:        The GeneXpert MRSA Assay (FDA approved for NASAL  specimens only), is one component of a comprehensive MRSA colonization surveillance program. It is not intended to diagnose MRSA infection nor to guide or monitor treatment for MRSA infections. Performed at [facility], [address], [place], [place] [address]   Culture, blood (routine x 2)  Status: None (Preliminary result)   Collection Time: 12/13/18 11:11 AM   Specimen: BLOOD  Result Value Ref Range Status   Specimen Description   Final    BLOOD RIGHT ARM Performed at [facility], [address], [place], [place] [address]    Special Requests   Final    BOTTLES DRAWN AEROBIC AND ANAEROBIC Blood Culture adequate volume Performed at [facility], [address], [place], [place] [address]    Culture   Final    NO GROWTH < 24 HOURS Performed at [facility], [address] [place], [place] [address]    Report Status PENDING  Incomplete  Culture, blood (routine x 2)     Status: None (Preliminary result)   Collection Time: 12/13/18 11:28 AM   Specimen: BLOOD LEFT HAND  Result Value Ref Range Status   Specimen Description   Final    BLOOD LEFT HAND Blood Culture adequate volume Performed at [facility], [address] [place], [place] [address]    Special Requests   Final    NONE Performed at [facility], [address] [place], [place] [address]    Culture   Final    NO GROWTH < 24 HOURS Performed at [facility], [address] [place], [place] [address]    Report Status PENDING  Incomplete  Aerobic/Anaerobic Culture (surgical/deep wound)     Status: None (Preliminary result)   Collection Time: 12/14/18 11:11 AM   Specimen: Wound; Tissue  Result Value Ref Range Status   Specimen Description WOUND ANTERIOR CERVICAL SIX CORPECTOMY FUSION  Final   Special Requests A  Final   Gram Stain   Final    NO WBC SEEN NO ORGANISMS SEEN Performed at [facility], [address], [place], [place] [address]    Culture PENDING  Incomplete   Report Status PENDING  Incomplete    Gardiner Barefoot, MD [facility] [email or web address] 12/15/2018, 9:55 AM

## 2018-12-15 NOTE — Progress Notes (Signed)
Initial Nutrition Assessment  DOCUMENTATION CODES:   Non-severe (moderate) malnutrition in context of chronic illness  INTERVENTION:  Continue Ensure Enlive po BID, each supplement provides 350 kcal and 20 grams of protein  Encourage adequate PO intake.   NUTRITION DIAGNOSIS:   Moderate Malnutrition related to chronic illness(drug abuse) as evidenced by moderate fat depletion, moderate muscle depletion.  GOAL:   Patient will meet greater than or equal to 90% of their needs  MONITOR:   PO intake, Supplement acceptance, Skin, Weight trends, Labs, I & O's  REASON FOR ASSESSMENT:   Malnutrition Screening Tool    ASSESSMENT:   48 year old Caucasian male with past medical history significant for IV heroin and cocaine abuse, chronic back pain and prior fractures of left arm and left ankle treated operatively.  Patient was admitted with complaints of neck and back pain.  Work-up done revealed osteomyelitis of C5-C6 spine with a phlegmon.  Patient was transferred to [facility] for further assessment, management and surgery.  [facility] jail.  PROCEDURE (9/28):  1. Decompressive anterior cervical discectomy C5-6, 2. Anterior cervical arthrodesis C5-6 utilizing a cortico-cancellus bone graft, 3. Anterior cervical plating C5-6 utilizing a Alphatec plate   Meal completion 100%. Pt reports having a good appetite with usual intake of at least 3 meals a day with no difficulties. Usual body weight reported to be ~160 lbs, which he reports had last weighed 1 month ago. No recent weight history to accurately confirm reported weight loss. Pt with a reported 7.5% weight loss in 1 month. Pt currently has Ensure ordered and has been consuming them. RD to continue with current orders to aid in caloric and protein needs.   NUTRITION - FOCUSED PHYSICAL EXAM:    Most Recent Value  Orbital Region  Unable to assess  Upper Arm Region  Moderate depletion  Thoracic and Lumbar Region   Moderate depletion  Buccal Region  Unable to assess  Temple Region  Moderate depletion  Clavicle Bone Region  Moderate depletion  Clavicle and Acromion Bone Region  Moderate depletion  Scapular Bone Region  Unable to assess  Dorsal Hand  Unable to assess  Patellar Region  Moderate depletion  Anterior Thigh Region  Moderate depletion  Posterior Calf Region  Moderate depletion  Edema (RD Assessment)  None  Hair  Reviewed  Eyes  Reviewed  Mouth  Reviewed  Skin  Reviewed  Nails  Reviewed       Diet Order:   Diet Order            Diet regular Room service appropriate? Yes with Assist; Fluid consistency: Thin  Diet effective now              EDUCATION NEEDS:   Not appropriate for education at this time  Skin:  Skin Assessment: Skin Integrity Issues: Skin Integrity Issues:: Incisions Incisions: neck  Last BM:  9/26  Height:   Ht Readings from Last 1 Encounters:  12/13/18 5\' 10"  (1.778 m)    Weight:   Wt Readings from Last 1 Encounters:  12/15/18 67.5 kg    Ideal Body Weight:  75.45 kg  BMI:  Body mass index is 21.35 kg/m.  Estimated Nutritional Needs:   Kcal:  1900-2100  Protein:  90-105 grams  Fluid:  1.9 - 2.1 L/day    Corrin Parker, MS, RD, LDN Pager # [phone] After hours/ weekend pager # [phone]

## 2018-12-15 NOTE — Progress Notes (Signed)
[facility] Progress Note  Patient: David Short XTK:[id number]   PCP: Default, Provider, MD DOB: 04-Jul-1970   DOA: 12/12/2018   DOS: 12/15/2018   Date of Service: the patient was seen and examined on 12/15/2018  Chief Complaint  Patient presents with  . Neck Pain  . Fever   Brief hospital course: Patient is a 48 year old Caucasian male with past medical history significant for IV heroin and cocaine abuse, chronic back pain and prior fractures of left arm and left ankle treated operatively.  Patient was admitted with complaints of neck and back pain.  Work-up done revealed discitis/osteomyelitis of C5-C6 spine with a phlegmon/epidural abscess.  Patient was transferred to [facility] for further assessment, management and surgery.  [facility] from jail.  Patient underwent decompressive anterior cervical discectomy C5-6, anterior cervical arthrodesis C5-6 utilizing a cortico-cancellus bone graft and anterior cervical plating C5-6 utilizing a Alphatec plate on 9/92/4268.  Infectious disease team is directing antibiotics management.  Subjective: No fever or chills.  Patient feels better.  Assessment and Plan: 1.  C5-C6 vertebral osteomyelitis. Concern for meningitis. Bacteremia with Serratia. IVDA with heroin last used 3 weeks ago. Polysubstance abuse with cocaine Back pain and neck pain Presents with above complaint. Underwent CSF examination which showed some pleocytosis. Patient was recommended for admission proceed possible meningitis. Started on IV broad-spectrum IV antibiotics as well as antiviral medication. Blood cultures come back positive for Serratia. MRI brain with and without contrast as well as MRI C-spine with and without contrast was performed which shows evidence of C5-C6 discitis as well as osteomyelitis and ventral phlegmon. Discussed with ID who recommends continue antibiotics. Discussed with neurosurgery who will evaluate the patient for possible  neurosurgical intervention but currently recommends to continue with the antibiotics. Patient will be treated with IV cefepime given concern for AMPc  Resistance Repeat blood cultures ordered. HSV PCR currently pending.  12/14/2018: Patient underwent surgery on 12/14/2018. 12/15/2018: Continue antibiotics.  Infectious disease team is directing.  Likely discharge once cleared by neurosurgery and infectious disease team.   2.  Pain control Flexeril and tramadol. Patient is refusing medications that includes Tylenol for now. 12/15/2018: Pain is better controlled.  3. Tylenol side effect Patient reports itching but no hives. Allergies updated.  4. Accelerated hypertension. Sinus tachycardia. Patient reports that he has significant anxiety and therefore his blood pressure is high. No prior history high blood pressure. We will treat with clonidine and labetalol. UDS negative for cocaine. Continue to optimize.  5.  Polysubstance abuse. Patient shoots heroin 3 times weekly.  As well as cocaine occasionally.   Monitor. Patient claims he uses clean needles. Counseled to quit illicit drug use  Diet: Cardiac diet  DVT Prophylaxis: Subcutaneous Lovenox  Advance goals of care discussion: Full code  Family Communication:   Disposition:  Neurosurgery team to advise.  Consultants: Neurosurgery, ID Procedures: LP  Scheduled Meds: . feeding supplement (ENSURE ENLIVE)  237 mL Oral BID BM  . senna  1 tablet Oral BID  . sodium chloride flush  3 mL Intravenous Q12H   Continuous Infusions: . sodium chloride    . 0.9 % NaCl with KCl 20 mEq / L Stopped (12/15/18 1020)  . ceFEPime (MAXIPIME) IV 2 g (12/15/18 1244)  . methocarbamol (ROBAXIN) IV     PRN Meds: calcium carbonate, diphenhydrAMINE, labetalol, menthol-cetylpyridinium **OR** phenol, methocarbamol **OR** methocarbamol (ROBAXIN) IV, morphine injection, ondansetron **OR** ondansetron (ZOFRAN) IV, oxyCODONE, senna-docusate, sodium  chloride flush, sodium chloride flush Antibiotics: Anti-infectives (From  admission, onward)   Start     Dose/Rate Route Frequency Ordered Stop   12/14/18 1028  bacitracin 50,000 Units in sodium chloride 0.9 % 500 mL irrigation  Status:  Discontinued       As needed 12/14/18 1028 12/14/18 1212   12/14/18 0941  ceFAZolin (ANCEF) 2-4 GM/100ML-% IVPB    Note to Pharmacy: Aquilla Hacker   : cabinet override      12/14/18 0941 12/14/18 2144   12/13/18 2000  ceFEPIme (MAXIPIME) 2 g in sodium chloride 0.9 % 100 mL IVPB  Status:  Discontinued     2 g 200 mL/hr over 30 Minutes Intravenous Every 8 hours 12/13/18 1054 12/13/18 1056   12/13/18 2000  ceFEPIme (MAXIPIME) 2 g in sodium chloride 0.9 % 100 mL IVPB     2 g 200 mL/hr over 30 Minutes Intravenous Every 8 hours 12/13/18 1056     12/13/18 1100  ceFEPIme (MAXIPIME) 2 g in sodium chloride 0.9 % 100 mL IVPB  Status:  Discontinued     2 g 200 mL/hr over 30 Minutes Intravenous STAT 12/13/18 1054 12/13/18 1054   12/13/18 1100  ceFEPIme (MAXIPIME) 2 g in sodium chloride 0.9 % 100 mL IVPB     2 g 200 mL/hr over 30 Minutes Intravenous STAT 12/13/18 1055 12/13/18 1207   12/13/18 0600  cefTRIAXone (ROCEPHIN) 2 g in sodium chloride 0.9 % 100 mL IVPB  Status:  Discontinued     2 g 200 mL/hr over 30 Minutes Intravenous Every 12 hours 12/12/18 1715 12/13/18 1042   12/13/18 0200  vancomycin (VANCOCIN) IVPB 750 mg/150 ml premix  Status:  Discontinued     750 mg 150 mL/hr over 60 Minutes Intravenous Every 8 hours 12/12/18 1740 12/13/18 0930   12/12/18 1745  acyclovir (ZOVIRAX) 635 mg in dextrose 5 % 100 mL IVPB  Status:  Discontinued     10 mg/kg  63.5 kg 112.7 mL/hr over 60 Minutes Intravenous Every 8 hours 12/12/18 1740 12/13/18 1042   12/12/18 1715  vancomycin (VANCOCIN) IVPB 1000 mg/200 mL premix  Status:  Discontinued     1,000 mg 200 mL/hr over 60 Minutes Intravenous  Once 12/12/18 1715 12/12/18 1724   12/12/18 1500  acyclovir (ZOVIRAX) 635 mg in  dextrose 5 % 100 mL IVPB     10 mg/kg  63.5 kg 112.7 mL/hr over 60 Minutes Intravenous  Once 12/12/18 1350 12/12/18 1824   12/12/18 1430  vancomycin (VANCOCIN) 1,250 mg in sodium chloride 0.9 % 250 mL IVPB     1,250 mg 166.7 mL/hr over 90 Minutes Intravenous  Once 12/12/18 1350 12/12/18 1657   12/12/18 1400  cefTRIAXone (ROCEPHIN) 2 g in sodium chloride 0.9 % 100 mL IVPB     2 g 200 mL/hr over 30 Minutes Intravenous  Once 12/12/18 1350 12/12/18 1657       Objective: Physical Exam: Vitals:   12/15/18 0407 12/15/18 0500 12/15/18 0817 12/15/18 1245  BP: 126/88  (!) 117/96 (!) 146/81  Pulse: 98  93 (!) 110  Resp: 20  19 16   Temp: 97.6 F (36.4 C)  98.7 F (37.1 C)   TempSrc: Oral  Oral Oral  SpO2: 93%  94% 91%  Weight:  67.5 kg    Height:        Intake/Output Summary (Last 24 hours) at 12/15/2018 1629 Last data filed at 12/15/2018 0818 Gross per 24 hour  Intake 459 ml  Output 1150 ml  Net -691 ml   12/17/2018  12/12/18 0835 12/13/18 2235 12/15/18 0500  Weight: 63.5 kg 63.4 kg 67.5 kg   General: alert and oriented to time, place, and person.  Patient is not in any distress. Eyes: Pallor.  No jaundice. ENT: Oral Mucosa Clear, moist  Neck: no JVD, no Abnormal Mass Or lumps Cardiovascular: S1 and S2 appreciated. Respiratory: Clear to auscultation. Abdomen: Bowel Sound present, Soft and no tenderness, no hernia Extremities: no Pedal edema, no calf tenderness Neurologic:  Awake and alert.  Moves all extremities.  CBC: Recent Labs  Lab 12/12/18 0920 12/13/18 0522 12/14/18 0808  WBC 7.1 6.0 9.4  NEUTROABS 4.6 4.9 7.3  HGB 11.0* 10.7* 11.5*  HCT 35.4* 34.4* 35.3*  MCV 80.8 80.0 78.6*  PLT 140* 166 185   Basic Metabolic Panel: Recent Labs  Lab 12/12/18 0920 12/13/18 0522 12/14/18 0808  NA 139 140 137  K 3.5 4.4 4.2  CL 104 109 103  CO2 25 21* 24  GLUCOSE 82 175* 90  BUN 7 13 15   CREATININE 0.54* 0.45* 0.65  CALCIUM 9.2 9.4 9.8  MG  --   --  1.9     Liver Function Tests: Recent Labs  Lab 12/13/18 0522 12/14/18 0808  AST 14* 17  ALT 15 20  ALKPHOS 76 77  BILITOT 0.3 0.4  PROT 7.3 7.6  ALBUMIN 3.2* 3.3*   No results for input(s): LIPASE, AMYLASE in the last 168 hours. No results for input(s): AMMONIA in the last 168 hours. Coagulation Profile: Recent Labs  Lab 12/14/18 0808  INR 1.0   Cardiac Enzymes: No results for input(s): CKTOTAL, CKMB, CKMBINDEX, TROPONINI in the last 168 hours. BNP (last 3 results) No results for input(s): PROBNP in the last 8760 hours. CBG: No results for input(s): GLUCAP in the last 168 hours. Studies: No results found.   Time spent: 35 minutes  Berton MountSylvester Maurice Ramseur, MD [facility] 12/15/2018 4:29 PM  To reach On-call, see care teams to locate the attending and reach out to them via [email or web address]. If 7PM-7AM, please contact night-coverage If you still have difficulty reaching the attending provider, please page the [facility] (Director on Call) for [facility] on amion for assistance.

## 2018-12-16 ENCOUNTER — Inpatient Hospital Stay (HOSPITAL_COMMUNITY)

## 2018-12-16 DIAGNOSIS — A4153 Sepsis due to Serratia: Secondary | ICD-10-CM

## 2018-12-16 DIAGNOSIS — F1910 Other psychoactive substance abuse, uncomplicated: Secondary | ICD-10-CM

## 2018-12-16 DIAGNOSIS — R030 Elevated blood-pressure reading, without diagnosis of hypertension: Secondary | ICD-10-CM

## 2018-12-16 DIAGNOSIS — M542 Cervicalgia: Secondary | ICD-10-CM

## 2018-12-16 LAB — RENAL FUNCTION PANEL
Albumin: 2.8 g/dL — ABNORMAL LOW (ref 3.5–5.0)
Anion gap: 11 (ref 5–15)
BUN: 17 mg/dL (ref 6–20)
CO2: 25 mmol/L (ref 22–32)
Calcium: 9.2 mg/dL (ref 8.9–10.3)
Chloride: 103 mmol/L (ref 98–111)
Creatinine, Ser: 0.61 mg/dL (ref 0.61–1.24)
GFR calc Af Amer: >60 mL/min (ref >60)
GFR calc non Af Amer: >60 mL/min (ref >60)
Glucose, Bld: 124 mg/dL — ABNORMAL HIGH (ref 70–99)
Phosphorus: 2.9 mg/dL (ref 2.5–4.6)
Potassium: 3.9 mmol/L (ref 3.5–5.1)
Sodium: 139 mmol/L (ref 135–145)

## 2018-12-16 LAB — CBC WITH DIFFERENTIAL/PLATELET
Abs Immature Granulocytes: 0.16 10*3/uL — ABNORMAL HIGH (ref 0.00–0.07)
Basophils Absolute: 0 10*3/uL (ref 0.0–0.1)
Basophils Relative: 0 %
Eosinophils Absolute: 0.3 10*3/uL (ref 0.0–0.5)
Eosinophils Relative: 3 %
HCT: 35.5 % — ABNORMAL LOW (ref 39.0–52.0)
Hemoglobin: 11.3 g/dL — ABNORMAL LOW (ref 13.0–17.0)
Immature Granulocytes: 2 %
Lymphocytes Relative: 22 %
Lymphs Abs: 1.8 10*3/uL (ref 0.7–4.0)
MCH: 25.9 pg — ABNORMAL LOW (ref 26.0–34.0)
MCHC: 31.8 g/dL (ref 30.0–36.0)
MCV: 81.2 fL (ref 80.0–100.0)
Monocytes Absolute: 0.8 10*3/uL (ref 0.1–1.0)
Monocytes Relative: 10 %
Neutro Abs: 5.1 10*3/uL (ref 1.7–7.7)
Neutrophils Relative %: 63 %
Platelets: 219 10*3/uL (ref 150–400)
RBC: 4.37 MIL/uL (ref 4.22–5.81)
RDW: 19.4 % — ABNORMAL HIGH (ref 11.5–15.5)
WBC: 8.1 10*3/uL (ref 4.0–10.5)
nRBC: 0 % (ref 0.0–0.2)

## 2018-12-16 LAB — CSF CULTURE W GRAM STAIN: Culture: NO GROWTH

## 2018-12-16 LAB — MAGNESIUM: Magnesium: 1.8 mg/dL (ref 1.7–2.4)

## 2018-12-16 MED ORDER — OXYCODONE HCL 5 MG PO TABS
10.0000 mg | ORAL_TABLET | ORAL | Status: DC | PRN
  Administered 2018-12-16 – 2018-12-17 (×6): 10 mg via ORAL
  Filled 2018-12-16 (×6): qty 2

## 2018-12-16 MED ORDER — KETOROLAC TROMETHAMINE 30 MG/ML IJ SOLN
30.0000 mg | Freq: Three times a day (TID) | INTRAMUSCULAR | Status: AC | PRN
  Administered 2018-12-16 – 2018-12-18 (×3): 30 mg via INTRAVENOUS
  Filled 2018-12-16 (×3): qty 1

## 2018-12-16 MED ORDER — SODIUM CHLORIDE 0.9 % IV SOLN
2.0000 g | INTRAVENOUS | Status: DC
  Administered 2018-12-16: 2 g via INTRAVENOUS
  Filled 2018-12-16: qty 2
  Filled 2018-12-16 (×2): qty 20

## 2018-12-16 NOTE — Plan of Care (Signed)

## 2018-12-16 NOTE — Progress Notes (Addendum)
[place] for Infectious Disease   Reason for visit: Follow up on cervical discitis/osteomyelitis  Interval History: no acute events, no associated n/v/d.  No rash.   Changed to ceftriaxone based on sensitivities.   Culture from OR also Serratia.    No complaints.    Physical Exam: Constitutional:  Vitals:   12/16/18 0344 12/16/18 0836  BP: 127/88 117/78  Pulse: 100   Resp:    Temp: 98.4 F (36.9 C) 98.2 F (36.8 C)  SpO2: 92% 94%   patient appears in NAD Eyes: anicteric HENT: neck brace in place Respiratory: Normal respiratory effort; CTA B Cardiovascular: RRR   Review of Systems: Constitutional: negative for fevers and chills Gastrointestinal: negative for nausea and diarrhea Integument/breast: negative for rash  Lab Results  Component Value Date   WBC 8.1 12/16/2018   HGB 11.3 (L) 12/16/2018   HCT 35.5 (L) 12/16/2018   MCV 81.2 12/16/2018   PLT 219 12/16/2018    Lab Results  Component Value Date   CREATININE 0.61 12/16/2018   BUN 17 12/16/2018   NA 139 12/16/2018   K 3.9 12/16/2018   CL 103 12/16/2018   CO2 25 12/16/2018    Lab Results  Component Value Date   ALT 20 12/14/2018   AST 17 12/14/2018   ALKPHOS 77 12/14/2018     Microbiology: Recent Results (from the past 240 hour(s))  Blood culture (routine x 2)     Status: Abnormal (Preliminary result)   Collection Time: 12/12/18  9:20 AM   Specimen: BLOOD  Result Value Ref Range Status   Specimen Description   Final    BLOOD LEFT ANTECUBITAL Performed at [facility], [place] [address], [place], [place] [address]    Special Requests   Final    BOTTLES DRAWN AEROBIC AND ANAEROBIC Blood Culture results may not be optimal due to an excessive volume of blood received in culture bottles Performed at [place] [address], [place], [place] [address]    Culture  Setup Time   Final    GRAM NEGATIVE RODS IN BOTH AEROBIC AND ANAEROBIC BOTTLES CRITICAL  RESULT CALLED TO, READ BACK BY AND VERIFIED WITH: PHARMD Shelda Jakes [phone] FCP    Culture (A)  Final    SERRATIA MARCESCENS CULTURE REINCUBATED FOR BETTER GROWTH Performed at [facility], [place] [address], [place], [place] [address]    Report Status PENDING  Incomplete   Organism ID, Bacteria SERRATIA MARCESCENS  Final      Susceptibility   Serratia marcescens - MIC*    CEFAZOLIN >=64 RESISTANT Resistant     CEFEPIME <=1 SENSITIVE Sensitive     CEFTAZIDIME <=1 SENSITIVE Sensitive     CEFTRIAXONE <=1 SENSITIVE Sensitive     CIPROFLOXACIN <=0.25 SENSITIVE Sensitive     GENTAMICIN <=1 SENSITIVE Sensitive     TRIMETH/SULFA <=20 SENSITIVE Sensitive     * SERRATIA MARCESCENS  Blood culture (routine x 2)     Status: None (Preliminary result)   Collection Time: 12/12/18  9:20 AM   Specimen: BLOOD LEFT HAND  Result Value Ref Range Status   Specimen Description   Final    BLOOD LEFT HAND Performed at [facility], [place] [address] [place], [place] [address]    Special Requests   Final    BOTTLES DRAWN AEROBIC ONLY Blood Culture results may not be optimal due to an inadequate volume of blood received in culture bottles Performed at  [facility], [address] [place], [place] [address]    Culture   Final    NO GROWTH 4 DAYS Performed at [facility], [address] [place], [place] [address]    Report Status PENDING  Incomplete  SARS CORONAVIRUS 2 (TAT 6-24 HRS) Nasopharyngeal Nasopharyngeal Swab     Status: None   Collection Time: 12/12/18  9:20 AM   Specimen: Nasopharyngeal Swab  Result Value Ref Range Status   SARS Coronavirus 2 NEGATIVE NEGATIVE Final    Comment: (NOTE) SARS-CoV-2 target nucleic acids are NOT DETECTED. The SARS-CoV-2 RNA is generally detectable in upper and lower respiratory specimens during the acute phase of infection. Negative results do not preclude SARS-CoV-2 infection, do not rule out co-infections with other  pathogens, and should not be used as the sole basis for treatment or other patient management decisions. Negative results must be combined with clinical observations, patient history, and epidemiological information. The expected result is Negative. Fact Sheet for Patients: HairSlick.nohttps://www.fda.gov/media/138098/download Fact Sheet for Healthcare Providers: quierodirigir.comhttps://www.fda.gov/media/138095/download This test is not yet approved or cleared by the [place] FDA and  has been authorized for detection and/or diagnosis of SARS-CoV-2 by FDA under an Emergency Use Authorization (EUA). This EUA will remain  in effect (meaning this test can be used) for the duration of the COVID-19 declaration under Section 56 4(b)(1) of the Act, 21 U.S.C. section 360bbb-3(b)(1), unless the authorization is terminated or revoked sooner. Performed at [facility], [address] [place], [place] [address]   Blood Culture ID Panel (Reflexed)     Status: Abnormal   Collection Time: 12/12/18  9:20 AM  Result Value Ref Range Status   Enterococcus species NOT DETECTED NOT DETECTED Final   Listeria monocytogenes NOT DETECTED NOT DETECTED Final   Staphylococcus species NOT DETECTED NOT DETECTED Final   Staphylococcus aureus (BCID) NOT DETECTED NOT DETECTED Final   Streptococcus species NOT DETECTED NOT DETECTED Final   Streptococcus agalactiae NOT DETECTED NOT DETECTED Final   Streptococcus pneumoniae NOT DETECTED NOT DETECTED Final   Streptococcus pyogenes NOT DETECTED NOT DETECTED Final   Acinetobacter baumannii NOT DETECTED NOT DETECTED Final   Enterobacteriaceae species DETECTED (A) NOT DETECTED Final    Comment: Enterobacteriaceae represent a large family of gram-negative bacteria, not a single organism. CRITICAL RESULT CALLED TO, READ BACK BY AND VERIFIED WITH: PHARMD Janine LimboM. SWAYNE [phone] FCP    Enterobacter cloacae complex NOT DETECTED NOT DETECTED Final   Escherichia coli NOT DETECTED NOT DETECTED  Final   Klebsiella oxytoca NOT DETECTED NOT DETECTED Final   Klebsiella pneumoniae NOT DETECTED NOT DETECTED Final   Proteus species NOT DETECTED NOT DETECTED Final   Serratia marcescens DETECTED (A) NOT DETECTED Final    Comment: CRITICAL RESULT CALLED TO, READ BACK BY AND VERIFIED WITH: PHARMD Janine LimboM. SWAYNE [phone] FCP    Carbapenem resistance NOT DETECTED NOT DETECTED Final   Haemophilus influenzae NOT DETECTED NOT DETECTED Final   Neisseria meningitidis NOT DETECTED NOT DETECTED Final   Pseudomonas aeruginosa NOT DETECTED NOT DETECTED Final   Candida albicans NOT DETECTED NOT DETECTED Final   Candida glabrata NOT DETECTED NOT DETECTED Final   Candida krusei NOT DETECTED NOT DETECTED Final   Candida parapsilosis NOT DETECTED NOT DETECTED Final   Candida tropicalis NOT DETECTED NOT DETECTED Final    Comment: Performed at [facility], [address] [place], [place] [address]  CSF culture     Status: None (Preliminary  result)   Collection Time: 12/12/18 12:21 PM   Specimen: Back; Cerebrospinal Fluid  Result Value Ref Range Status   Specimen Description   Final    BACK CSF Performed at [facility], [address], [place], [place] [address]    Special Requests   Final    NONE Performed at [facility], [address], [place], [place] [address]    Gram Stain   Final    WBC PRESENT, PREDOMINANTLY MONONUCLEAR NO ORGANISMS SEEN CYTOSPIN SMEAR Gram Stain Report Called to,Read Back By and Verified With: CALLED TO Katha Cabal, RN ON 12/12/18 @ 1317 BY LE Performed at [facility], [address], [place], [place] [address]    Culture   Final    NO GROWTH 3 DAYS Performed at [facility], [address] [place], [place] [address]    Report Status PENDING  Incomplete  MRSA PCR Screening     Status: None   Collection Time: 12/12/18  9:23 PM   Specimen: Nasal Mucosa; Nasopharyngeal  Result Value Ref Range Status    MRSA by PCR NEGATIVE NEGATIVE Final    Comment:        The GeneXpert MRSA Assay (FDA approved for NASAL specimens only), is one component of a comprehensive MRSA colonization surveillance program. It is not intended to diagnose MRSA infection nor to guide or monitor treatment for MRSA infections. Performed at [facility], [address] [place], [place] [address]   Culture, blood (routine x 2)     Status: None (Preliminary result)   Collection Time: 12/13/18 11:11 AM   Specimen: BLOOD  Result Value Ref Range Status   Specimen Description   Final    BLOOD RIGHT ARM Performed at [facility], [address], [place], [place] [address]    Special Requests   Final    BOTTLES DRAWN AEROBIC AND ANAEROBIC Blood Culture adequate volume Performed at [facility], [address], [place], [place] [address]    Culture   Final    NO GROWTH 3 DAYS Performed at [facility], [address] [place], [place] [address]    Report Status PENDING  Incomplete  Culture, blood (routine x 2)     Status: None (Preliminary result)   Collection Time: 12/13/18 11:28 AM   Specimen: BLOOD LEFT HAND  Result Value Ref Range Status   Specimen Description   Final    BLOOD LEFT HAND Blood Culture adequate volume Performed at [facility], [address] [place], [place] [address]    Special Requests   Final    NONE Performed at [facility], [address] [place], [place] [address]    Culture   Final    NO GROWTH 3 DAYS Performed at [facility], [address], [place], [place] [address]    Report Status PENDING  Incomplete  Aerobic/Anaerobic Culture (surgical/deep wound)     Status: None (Preliminary result)   Collection Time: 12/14/18 11:11 AM   Specimen: Wound; Tissue  Result Value Ref Range Status   Specimen Description WOUND ANTERIOR CERVICAL SIX CORPECTOMY FUSION  Final    Special Requests A  Final   Gram Stain NO WBC SEEN NO ORGANISMS SEEN   Final   Culture   Final    FEW SERRATIA MARCESCENS SUSCEPTIBILITIES TO FOLLOW Performed at [facility], [address] [place], [place] [address]  Report Status PENDING  Incomplete    Impression/Plan:  1. Discitis/osteomyelitis - Serratia in culture.  ON ceftriaxone and will be able to take high dose oral levaquin 750 mg once daily for 6 weeks through November 22nd at discharge.    2.  Medication monitoring - he will need a weekly bmp.  Checking a baseline EKG.  Discussed with Dr. Lajuana Ripple  ADDENDUM: he will need to follow up toward the end of his treatment course.  I will schedule him for 11/16.   I will sign off, call with questions.

## 2018-12-16 NOTE — Anesthesia Postprocedure Evaluation (Signed)
Anesthesia Post Note  Patient: David Short  Procedure(s) Performed: ANTERIOR CERVICAL DISCECTOMY FUSION CERVICAL FIVE- CERVICAL SIX (N/A Neck)     Patient location during evaluation: PACU Anesthesia Type: General Level of consciousness: awake and alert Pain management: pain level controlled Vital Signs Assessment: post-procedure vital signs reviewed and stable Respiratory status: spontaneous breathing, nonlabored ventilation, respiratory function stable and patient connected to nasal cannula oxygen Cardiovascular status: blood pressure returned to baseline and stable Postop Assessment: no apparent nausea or vomiting Anesthetic complications: no    Last Vitals:  Vitals:   12/16/18 0344 12/16/18 0836  BP: 127/88 117/78  Pulse: 100   Resp:    Temp: 36.9 C 36.8 C  SpO2: 92% 94%    Last Pain:  Vitals:   12/16/18 0836  TempSrc: Oral  PainSc:                  [place]

## 2018-12-16 NOTE — Progress Notes (Signed)
RN transferred patient's belongings from [place] to room [phone], pt and guard informed belongings now in closet at bedside. Belongings in bag included orange prison jump suit, orange foam shoes, and 2 pieces of paper with medical information on it, and power port/midline information on it. Verified with Gwenlyn Found, RN at bedside.

## 2018-12-16 NOTE — Progress Notes (Signed)
Physical Therapy Treatment Patient Details Name: David Short MRN: [id number] DOB: 16-Mar-1971 Today's Date: 12/16/2018    History of Present Illness 48 year old Caucasian male with past medical history significant for IV heroin and cocaine abuse, chronic back pain and prior fractures of left arm and left ankle treated operatively.  Patient was admitted with complaints of neck and back pain.  Work-up done revealed osteomyelitis of C5-C6 spine with a phlegmon.  He underwent ACDF 12/14/18.    PT Comments    Pt progressing well with mobility however c/o 10/10 neck pain. Educated on using ice to help minimize swelling. Provided tactile/therapeutic massage to L upper trap and pin point pressure to release tension. Pt appreciative of services stating some relief. Session limited by transport coming to take pt to xray. Acute PT to cont to follow.    Follow Up Recommendations  No PT follow up     Equipment Recommendations  None recommended by PT    Recommendations for Other Services       Precautions / Restrictions Precautions Precautions: Cervical Precaution Booklet Issued: No Precaution Comments: recalled 2/3 precautions Required Braces or Orthoses: Cervical Brace Cervical Brace: Hard collar;At all times Restrictions Weight Bearing Restrictions: No    Mobility  Bed Mobility               General bed mobility comments: pt received in bathroom  Transfers Overall transfer level: Needs assistance Equipment used: None             General transfer comment: pt was up in bathroom  Ambulation/Gait Ambulation/Gait assistance: Min guard Gait Distance (Feet): 200 Feet Assistive device: None Gait Pattern/deviations: Step-through pattern;Decreased stride length(limited by shackles) Gait velocity: decreased Gait velocity interpretation: 1.31 - 2.62 ft/sec, indicative of limited community ambulator General Gait Details: pt kicked locks on the shackles requiring minA to  prevent fall due to quick onset of pain. otherwise pt stable and steady. verbal cues to relax shoulders however pt    Stairs             Wheelchair Mobility    Modified Rankin (Stroke Patients Only)       Balance Overall balance assessment: Mild deficits observed, not formally tested                                          Cognition Arousal/Alertness: Awake/alert Behavior During Therapy: WFL for tasks assessed/performed Overall Cognitive Status: Within Functional Limits for tasks assessed                                        Exercises      General Comments General comments (skin integrity, edema, etc.): VSS, indep with tolieting      Pertinent Vitals/Pain Pain Assessment: 0-10 Faces Pain Scale: Hurts worst Pain Location: neck Pain Descriptors / Indicators: Discomfort;Sore Pain Intervention(s): Monitored during session    Home Living                      Prior Function            PT Goals (current goals can now be found in the care plan section) Acute Rehab PT Goals Patient Stated Goal: decrease pain Progress towards PT goals: Progressing toward goals    Frequency    Min  3X/week      PT Plan Current plan remains appropriate;Frequency needs to be updated    Co-evaluation              AM-PAC PT "6 Clicks" Mobility   Outcome Measure  Help needed turning from your back to your side while in a flat bed without using bedrails?: None Help needed moving from lying on your back to sitting on the side of a flat bed without using bedrails?: None Help needed moving to and from a bed to a chair (including a wheelchair)?: None Help needed standing up from a chair using your arms (e.g., wheelchair or bedside chair)?: None Help needed to walk in hospital room?: A Little Help needed climbing 3-5 steps with a railing? : A Little 6 Click Score: 22    End of Session Equipment Utilized During Treatment: Gait  belt;Cervical collar Activity Tolerance: Patient tolerated treatment well Patient left: in chair;with call bell/phone within reach;Other (comment)(in transport w/c leaving for xray) Nurse Communication: Mobility status PT Visit Diagnosis: Difficulty in walking, not elsewhere classified (R26.2);Pain     Time: 0750-0806 PT Time Calculation (min) (ACUTE ONLY): 16 min  Charges:  $Gait Training: 8-22 mins                     Lewis Shock, PT, DPT Acute Rehabilitation Services Pager #: [phone] Office #: [phone]    Iona Hansen 12/16/2018, 8:47 AM

## 2018-12-16 NOTE — Progress Notes (Signed)
Patient ID: David Short, male   DOB: [birth year], 48 y.o.   MRN: [id number] C/o more neck pain today, no NTW, dressing flat and dry, in collar, normal neuro exam  Check xrays today Pain control  Continue abx

## 2018-12-16 NOTE — Progress Notes (Addendum)
PROGRESS NOTE    David Short  ZOX:[id number]  DOB: 1970-10-04  DOA: 12/12/2018 PCP: Default, Provider, MD  Brief Narrative:  48 year old Caucasian male with past medical history significant for IV heroin and cocaine abuse, chronic back pain and prior fractures of left arm and left ankle treated operatively, now presented from jail with complaints of neck and back pain.Underwent CSF examination which showed some pleocytosis,  Admitted initially with tentative diagnosis of aseptic meningitis -on IV broad-spectrum IV antibiotics as well as antiviral medication.Blood cultures come back positive for Serratia. MRI brain / MRI C-spine with and without contrast was performed which showed evidence of C5-C6 discitis as well as osteomyelitis with ventral phlegmon/epidural abscess.  Patient was transferred to [facility] for further assessment, management and surgery. Patient underwent decompressive anterior cervical discectomy C5-6, anterior cervical arthrodesisC5-6utilizing a cortico-cancellusbone graft and anterior cervical plating C5-6utilizing a Alphatecplate on 12/14/2018 by Neurosurgery.  Infectious disease team is directing antibiotics management. Neurosurgery following along.  Subjective:  Patient resting , in no acute distress but c/o pain in his neck at 10/10. States  oxycodone not helping, wants 10 mg. Had BM earlier today. Police officer bedside Objective: Vitals:   12/15/18 1744 12/15/18 1948 12/15/18 2312 12/16/18 0344  BP:  128/80 (!) 129/91 127/88  Pulse:  (!) 116 (!) 106 100  Resp:      Temp:  97.6 F (36.4 C) 98.6 F (37 C) 98.4 F (36.9 C)  TempSrc:  Oral Oral Oral  SpO2: 92% 93% 91% 92%  Weight:      Height:       No intake or output data in the 24 hours ending 12/16/18 0830 Filed Weights   12/12/18 0835 12/13/18 2235 12/15/18 0500  Weight: 63.5 kg 63.4 kg 67.5 kg    Physical Examination:  General exam: Appears calm and comfortable  Head ENT: Neck  collar in place.  Dry dressing at surgical site Respiratory system: Clear to auscultation. Respiratory effort normal. Cardiovascular system: S1 & S2 heard, RRR. No JVD, murmurs, rubs, gallops or clicks. No pedal edema. Gastrointestinal system: Abdomen is nondistended, soft and nontender. No organomegaly or masses felt. Normal bowel sounds heard. Central nervous system: Alert and oriented. No focal neurological deficits. Extremities: Symmetric , no leg edema Skin: No rashes, lesions or ulcers Psychiatry:  Mood & affect anxious    Data Reviewed: I have personally reviewed following labs and imaging studies  CBC: Recent Labs  Lab 12/12/18 0920 12/13/18 0522 12/14/18 0808 12/16/18 0734  WBC 7.1 6.0 9.4 8.1  NEUTROABS 4.6 4.9 7.3 5.1  HGB 11.0* 10.7* 11.5* 11.3*  HCT 35.4* 34.4* 35.3* 35.5*  MCV 80.8 80.0 78.6* 81.2  PLT 140* 166 185 219   Basic Metabolic Panel: Recent Labs  Lab 12/12/18 0920 12/13/18 0522 12/14/18 0808 12/16/18 0734  NA 139 140 137 139  K 3.5 4.4 4.2 3.9  CL 104 109 103 103  CO2 25 21* 24 25  GLUCOSE 82 175* 90 124*  BUN CREATININE 0.54* 0.45* 0.65 0.61  CALCIUM 9.2 9.4 9.8 9.2  MG  --   --  1.9 1.8  PHOS  --   --   --  2.9   GFR: Estimated Creatinine Clearance: 106.6 mL/min (by C-G formula based on SCr of 0.61 mg/dL). Liver Function Tests: Recent Labs  Lab 12/13/18 0522 12/14/18 0808 12/16/18 0734  AST 14* 17  --   ALT 15 20  --   ALKPHOS 76 77  --  BILITOT 0.3 0.4  --   PROT 7.3 7.6  --   ALBUMIN 3.2* 3.3* 2.8*   No results for input(s): LIPASE, AMYLASE in the last 168 hours. No results for input(s): AMMONIA in the last 168 hours. Coagulation Profile: Recent Labs  Lab 12/14/18 0808  INR 1.0   Cardiac Enzymes: No results for input(s): CKTOTAL, CKMB, CKMBINDEX, TROPONINI in the last 168 hours. BNP (last 3 results) No results for input(s): PROBNP in the last 8760 hours. HbA1C: No results for input(s): HGBA1C in the last  72 hours. CBG: No results for input(s): GLUCAP in the last 168 hours. Lipid Profile: No results for input(s): CHOL, HDL, LDLCALC, TRIG, CHOLHDL, LDLDIRECT in the last 72 hours. Thyroid Function Tests: No results for input(s): TSH, T4TOTAL, FREET4, T3FREE, THYROIDAB in the last 72 hours. Anemia Panel: No results for input(s): VITAMINB12, FOLATE, FERRITIN, TIBC, IRON, RETICCTPCT in the last 72 hours. Sepsis Labs: Recent Labs  Lab 12/12/18 0920  LATICACIDVEN 0.9    Recent Results (from the past 240 hour(s))  Blood culture (routine x 2)     Status: Abnormal (Preliminary result)   Collection Time: 12/12/18  9:20 AM   Specimen: BLOOD  Result Value Ref Range Status   Specimen Description   Final    BLOOD LEFT ANTECUBITAL Performed at [facility] [address], [place], [place] [address]    Special Requests   Final    BOTTLES DRAWN AEROBIC AND ANAEROBIC Blood Culture results may not be optimal due to an excessive volume of blood received in culture bottles Performed at [facility] [address], [place], [place] [address]    Culture  Setup Time   Final    GRAM NEGATIVE RODS IN BOTH AEROBIC AND ANAEROBIC BOTTLES CRITICAL RESULT CALLED TO, READ BACK BY AND VERIFIED WITH: [place] [id number] FCP Performed at [facility], [place] [address], [place], [place] [address]    Culture SERRATIA MARCESCENS (A)  Final   Report Status PENDING  Incomplete   Organism ID, Bacteria SERRATIA MARCESCENS  Final      Susceptibility   Serratia marcescens - MIC*    CEFAZOLIN >=64 RESISTANT Resistant     CEFEPIME <=1 SENSITIVE Sensitive     CEFTAZIDIME <=1 SENSITIVE Sensitive     CEFTRIAXONE <=1 SENSITIVE Sensitive     CIPROFLOXACIN <=0.25 SENSITIVE Sensitive     GENTAMICIN <=1 SENSITIVE Sensitive     TRIMETH/SULFA <=20 SENSITIVE Sensitive     * SERRATIA MARCESCENS  Blood culture (routine x 2)     Status: None (Preliminary result)   Collection  Time: 12/12/18  9:20 AM   Specimen: BLOOD LEFT HAND  Result Value Ref Range Status   Specimen Description   Final    BLOOD LEFT HAND Performed at [facility], [place] [address], [place], [place] [address]    Special Requests   Final    BOTTLES DRAWN AEROBIC ONLY Blood Culture results may not be optimal due to an inadequate volume of blood received in culture bottles Performed at [facility] [address], [place], [place] [address]    Culture   Final    NO GROWTH 4 DAYS Performed at [facility], [place] [address], [place], [place] [address]    Report Status PENDING  Incomplete  SARS CORONAVIRUS 2 (TAT 6-24 HRS) Nasopharyngeal Nasopharyngeal Swab     Status: None   Collection Time: 12/12/18  9:20 AM  Specimen: Nasopharyngeal Swab  Result Value Ref Range Status   SARS Coronavirus 2 NEGATIVE NEGATIVE Final    Comment: (NOTE) SARS-CoV-2 target nucleic acids are NOT DETECTED. The SARS-CoV-2 RNA is generally detectable in upper and lower respiratory specimens during the acute phase of infection. Negative results do not preclude SARS-CoV-2 infection, do not rule out co-infections with other pathogens, and should not be used as the sole basis for treatment or other patient management decisions. Negative results must be combined with clinical observations, patient history, and epidemiological information. The expected result is Negative. Fact Sheet for Patients: HairSlick.nohttps://www.fda.gov/media/138098/download Fact Sheet for Healthcare Providers: quierodirigir.comhttps://www.fda.gov/media/138095/download This test is not yet approved or cleared by the [place] FDA and  has been authorized for detection and/or diagnosis of SARS-CoV-2 by FDA under an Emergency Use Authorization (EUA). This EUA will remain  in effect (meaning this test can be used) for the duration of the COVID-19 declaration under Section 56 4(b)(1) of the Act, 21 U.S.C. section 360bbb-3(b)(1), unless the  authorization is terminated or revoked sooner. Performed at [facility], [address] [place], [place] [address]   Blood Culture ID Panel (Reflexed)     Status: Abnormal   Collection Time: 12/12/18  9:20 AM  Result Value Ref Range Status   Enterococcus species NOT DETECTED NOT DETECTED Final   Listeria monocytogenes NOT DETECTED NOT DETECTED Final   Staphylococcus species NOT DETECTED NOT DETECTED Final   Staphylococcus aureus (BCID) NOT DETECTED NOT DETECTED Final   Streptococcus species NOT DETECTED NOT DETECTED Final   Streptococcus agalactiae NOT DETECTED NOT DETECTED Final   Streptococcus pneumoniae NOT DETECTED NOT DETECTED Final   Streptococcus pyogenes NOT DETECTED NOT DETECTED Final   Acinetobacter baumannii NOT DETECTED NOT DETECTED Final   Enterobacteriaceae species DETECTED (A) NOT DETECTED Final    Comment: Enterobacteriaceae represent a large family of gram-negative bacteria, not a single organism. CRITICAL RESULT CALLED TO, READ BACK BY AND VERIFIED WITH: PHARMD Janine LimboM. SWAYNE [phone] [id number] FCP    Enterobacter cloacae complex NOT DETECTED NOT DETECTED Final   Escherichia coli NOT DETECTED NOT DETECTED Final   Klebsiella oxytoca NOT DETECTED NOT DETECTED Final   Klebsiella pneumoniae NOT DETECTED NOT DETECTED Final   Proteus species NOT DETECTED NOT DETECTED Final   Serratia marcescens DETECTED (A) NOT DETECTED Final    Comment: CRITICAL RESULT CALLED TO, READ BACK BY AND VERIFIED WITH: PHARMD Janine LimboM. SWAYNE 9147 [phone] [id number] FCP    Carbapenem resistance NOT DETECTED NOT DETECTED Final   Haemophilus influenzae NOT DETECTED NOT DETECTED Final   Neisseria meningitidis NOT DETECTED NOT DETECTED Final   Pseudomonas aeruginosa NOT DETECTED NOT DETECTED Final   Candida albicans NOT DETECTED NOT DETECTED Final   Candida glabrata NOT DETECTED NOT DETECTED Final   Candida krusei NOT DETECTED NOT DETECTED Final   Candida parapsilosis NOT DETECTED NOT DETECTED Final   Candida  tropicalis NOT DETECTED NOT DETECTED Final    Comment: Performed at [facility], [address] [place], [place] [address]  CSF culture     Status: None (Preliminary result)   Collection Time: 12/12/18 12:21 PM   Specimen: Back; Cerebrospinal Fluid  Result Value Ref Range Status   Specimen Description   Final    BACK CSF Performed at [facility], [address] [place], [place] [address]    Special Requests   Final    NONE Performed at [facility], [address] [place], [place] [address]  Gram Stain   Final    WBC PRESENT, PREDOMINANTLY MONONUCLEAR NO ORGANISMS SEEN CYTOSPIN SMEAR Gram Stain Report Called to,Read Back By and Verified With: CALLED TO Katha Cabal, RN ON 12/12/18 @ 1317 BY LE Performed at [facility], [address] [place], [place] [address]    Culture   Final    NO GROWTH 3 DAYS Performed at [facility], [address], [place], [place] [address]    Report Status PENDING  Incomplete  MRSA PCR Screening     Status: None   Collection Time: 12/12/18  9:23 PM   Specimen: Nasal Mucosa; Nasopharyngeal  Result Value Ref Range Status   MRSA by PCR NEGATIVE NEGATIVE Final    Comment:        The GeneXpert MRSA Assay (FDA approved for NASAL specimens only), is one component of a comprehensive MRSA colonization surveillance program. It is not intended to diagnose MRSA infection nor to guide or monitor treatment for MRSA infections. Performed at [facility], [address] [place], [place] [address]   Culture, blood (routine x 2)     Status: None (Preliminary result)   Collection Time: 12/13/18 11:11 AM   Specimen: BLOOD  Result Value Ref Range Status   Specimen Description   Final    BLOOD RIGHT ARM Performed at [facility], [address], [place], [place] [address]    Special Requests   Final    BOTTLES DRAWN AEROBIC AND ANAEROBIC Blood  Culture adequate volume Performed at [facility], [address], [place], [place] [address]    Culture   Final    NO GROWTH 3 DAYS Performed at [facility], [address], [place], [place] [address]    Report Status PENDING  Incomplete  Culture, blood (routine x 2)     Status: None (Preliminary result)   Collection Time: 12/13/18 11:28 AM   Specimen: BLOOD LEFT HAND  Result Value Ref Range Status   Specimen Description   Final    BLOOD LEFT HAND Blood Culture adequate volume Performed at [facility], [address], [place], [place] [address]    Special Requests   Final    NONE Performed at [facility], [address] [place], [place] [address]    Culture   Final    NO GROWTH 3 DAYS Performed at [facility], [address], [place], [place] [address]    Report Status PENDING  Incomplete  Aerobic/Anaerobic Culture (surgical/deep wound)     Status: None (Preliminary result)   Collection Time: 12/14/18 11:11 AM   Specimen: Wound; Tissue  Result Value Ref Range Status   Specimen Description WOUND ANTERIOR CERVICAL SIX CORPECTOMY FUSION  Final   Special Requests A  Final   Gram Stain NO WBC SEEN NO ORGANISMS SEEN   Final   Culture   Final    FEW SERRATIA MARCESCENS SUSCEPTIBILITIES TO FOLLOW Performed at [facility], [address] [place], [place] [address]    Report Status PENDING  Incomplete      Radiology Studies: Dg Cervical Spine 2-3 Views  Result Date: 12/14/2018 CLINICAL DATA:  ACDF EXAM: DG C-ARM 1-60 MIN; CERVICAL SPINE - 2-3 VIEW CONTRAST:  None FLUOROSCOPY TIME:  Fluoroscopy Time:  27 seconds Number of Acquired Spot Images: 1 COMPARISON:  MRI 12/13/2018 FINDINGS: Single low resolution intraoperative spot view of the cervical spine. Total fluoroscopy time was  27 seconds. The images demonstrate anterior plate and screw fixation at C5-C6 with interbody device. IMPRESSION:  Intraoperative fluoroscopic assistance provided during cervical spine surgery Electronically Signed   By: Jasmine Pang M.D.   On: 12/14/2018 16:48   Dg C-arm 1-60 Min  Result Date: 12/14/2018 CLINICAL DATA:  ACDF EXAM: DG C-ARM 1-60 MIN; CERVICAL SPINE - 2-3 VIEW CONTRAST:  None FLUOROSCOPY TIME:  Fluoroscopy Time:  27 seconds Number of Acquired Spot Images: 1 COMPARISON:  MRI 12/13/2018 FINDINGS: Single low resolution intraoperative spot view of the cervical spine. Total fluoroscopy time was 27 seconds. The images demonstrate anterior plate and screw fixation at C5-C6 with interbody device. IMPRESSION: Intraoperative fluoroscopic assistance provided during cervical spine surgery Electronically Signed   By: Jasmine Pang M.D.   On: 12/14/2018 16:48        Scheduled Meds: . feeding supplement (ENSURE ENLIVE)  237 mL Oral BID BM  . senna  1 tablet Oral BID  . sodium chloride flush  3 mL Intravenous Q12H   Continuous Infusions: . sodium chloride    . 0.9 % NaCl with KCl 20 mEq / L Stopped (12/15/18 1020)  . ceFEPime (MAXIPIME) IV 2 g (12/16/18 0430)  . methocarbamol (ROBAXIN) IV      Assessment & Plan:    1. 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  Seen by infectious diseases for follow-up and recommend transition to oral antibiotics at the time of discharge: Potentially Levaquin 750 mg daily until November 22.  Will check baseline EKG for QTC. Neurosurgery following along.  Patient on oral opiates and IV methocarbamol.  PT/OT evaluation.  May need to go to the infirmary: Will contact them at [phone] once discharge plan clear.  2.  Serratia bacteremia: Blood cultures and wound cultures growing Serratia.  Levaquin course as discussed above.    3.  Polysubstance abuse:  Urine drug screen unremarkable on presentation.  Has history of cocaine/heroin abuse.Watch for signs of withdrawal.  Currently on opiates but in the setting of  postoperative pain-discussed with patient regarding slow taper with nearing discharge plan  4.  Elevated blood pressure: In the setting of problem #1, pain and history of drug use.  Currently normotensive.  Off IV fluids.  Continue to monitor without meds  5. Mild thrombocytopenia: Present on admission in the setting of problem #1.  Now resolved  DVT prophylaxis: Lovenox Code Status: Full code Family / Patient Communication: Discussed with patient. Disposition Plan: He states he has a court date on October 12 at which time he anticipates to be released from jail.  Will contact infirmary once discharge plan clear and off IV medications.     LOS: 4 days    Time spent: 35 minutes    Alessandra Bevels, MD [facility] Pager [phone]  If 7PM-7AM, please contact night-coverage [email or web address] Password [email or web address] 12/16/2018, 8:30 AM

## 2018-12-17 LAB — CULTURE, BLOOD (ROUTINE X 2): Culture: NO GROWTH

## 2018-12-17 MED ORDER — LEVOFLOXACIN 500 MG PO TABS
750.0000 mg | ORAL_TABLET | Freq: Every day | ORAL | Status: DC
  Administered 2018-12-17 – 2018-12-22 (×6): 750 mg via ORAL
  Filled 2018-12-17 (×6): qty 2

## 2018-12-17 MED ORDER — OXYCODONE HCL 5 MG PO TABS
10.0000 mg | ORAL_TABLET | ORAL | Status: DC | PRN
  Administered 2018-12-17 – 2018-12-18 (×8): 10 mg via ORAL
  Filled 2018-12-17 (×8): qty 2

## 2018-12-17 NOTE — Progress Notes (Signed)
Physical Therapy Treatment Patient Details Name: David Short MRN: [id number] DOB: 1970/05/07 Today's Date: 12/17/2018    History of Present Illness 48 year old Caucasian male with past medical history significant for IV heroin and cocaine abuse, chronic back pain and prior fractures of left arm and left ankle treated operatively.  Patient was admitted with complaints of neck and back pain.  Work-up done revealed osteomyelitis of C5-C6 spine with a phlegmon.  He underwent ACDF 12/14/18.    PT Comments    Pt con't to be in 10/10 pain however pt functioning at supervision. Pt indep with tolieting, hygiene and washing hands. Supervision/min guard for ambulation. Provided manual therapy to L neck/shoulder to assist with pain management. Acute PT to cont to follow.    Follow Up Recommendations  No PT follow up     Equipment Recommendations  None recommended by PT    Recommendations for Other Services       Precautions / Restrictions Precautions Precautions: Cervical Precaution Booklet Issued: No Precaution Comments: pt recalled precautions however requires max verbal cues to adhere functionally, ie. rolling out of bed instead of pulling self up into long sit Required Braces or Orthoses: Cervical Brace Cervical Brace: Hard collar;At all times Restrictions Weight Bearing Restrictions: No    Mobility  Bed Mobility Overal bed mobility: Modified Independent Bed Mobility: Supine to Sit Rolling: Modified independent (Device/Increase time)   Supine to sit: Modified independent (Device/Increase time)     General bed mobility comments: verbal cues to log roll however pt mildly impulsive with request to use bathroom  Transfers Overall transfer level: Needs assistance Equipment used: None Transfers: Sit to/from Stand Sit to Stand: Supervision         General transfer comment: pt quick to stand but steady  Ambulation/Gait Ambulation/Gait assistance: Min guard Gait Distance  (Feet): 450 Feet Assistive device: None Gait Pattern/deviations: Step-through pattern;Decreased stride length(limited by shackles) Gait velocity: decreased Gait velocity interpretation: 1.31 - 2.62 ft/sec, indicative of limited community ambulator General Gait Details: pt kicked locks on the shackles requiring minA to prevent fall due to quick onset of pain. otherwise pt stable and steady. verbal cues to relax shoulders however pt    Stairs             Wheelchair Mobility    Modified Rankin (Stroke Patients Only)       Balance Overall balance assessment: Mild deficits observed, not formally tested                                          Cognition Arousal/Alertness: Awake/alert Behavior During Therapy: Anxious Overall Cognitive Status: Within Functional Limits for tasks assessed                                 General Comments: anxious regarding pain not getting pain medicine on time      Exercises      General Comments General comments (skin integrity, edema, etc.): VSS. provided therapeutic massage and trigger point management to L upper trap for pain relief, educated pt on how to use a ball to push on spot against chair but no to leave permanetly       Pertinent Vitals/Pain Pain Assessment: 0-10 Faces Pain Scale: Hurts whole lot Pain Location: neck Pain Descriptors / Indicators: Discomfort;Sore Pain Intervention(s): Premedicated before session  Home Living                      Prior Function            PT Goals (current goals can now be found in the care plan section) Acute Rehab PT Goals Patient Stated Goal: decrease pain Progress towards PT goals: Progressing toward goals    Frequency    Min 3X/week      PT Plan Current plan remains appropriate;Frequency needs to be updated    Co-evaluation              AM-PAC PT "6 Clicks" Mobility   Outcome Measure  Help needed turning from your back to  your side while in a flat bed without using bedrails?: None Help needed moving from lying on your back to sitting on the side of a flat bed without using bedrails?: None Help needed moving to and from a bed to a chair (including a wheelchair)?: None Help needed standing up from a chair using your arms (e.g., wheelchair or bedside chair)?: None Help needed to walk in hospital room?: A Little Help needed climbing 3-5 steps with a railing? : A Little 6 Click Score: 22    End of Session Equipment Utilized During Treatment: Gait belt;Cervical collar Activity Tolerance: Patient tolerated treatment well Patient left: in chair;with call bell/phone within reach;Other (comment)(in transport w/c leaving for xray) Nurse Communication: Mobility status PT Visit Diagnosis: Difficulty in walking, not elsewhere classified (R26.2);Pain     Time: 0800-0825 PT Time Calculation (min) (ACUTE ONLY): 25 min  Charges:  $Gait Training: 8-22 mins $Therapeutic Exercise: 8-22 mins                     David Short, PT, DPT Acute Rehabilitation Services Pager #: [phone] Office #: [phone]    David Short 12/17/2018, 12:22 PM

## 2018-12-17 NOTE — Progress Notes (Signed)
Occupational Therapy Treatment Patient Details Name: David Short MRN: [id number] DOB: 1970-06-30 Today's Date: 12/17/2018    History of present illness 48 year old Caucasian male with past medical history significant for IV heroin and cocaine abuse, chronic back pain and prior fractures of left arm and left ankle treated operatively.  Patient was admitted with complaints of neck and back pain.  Work-up done revealed osteomyelitis of C5-C6 spine with a phlegmon.  He underwent ACDF 12/14/18.   OT comments  Pt continues to progress towards OT goals. Pt currently presents with decreased knowledge of precautions, compensatory strategies, and increased pain. Provided education for brace management and compensatory strategies for UB dressing. Pt required VCs for safety and maintaining cervical precautions throughout. Pt donned/doffed brace in supine with mod A, and min guard A for UB dressing. Pt performed toileting and hand hygiene with supervision for safety. Continue to recommend no OT follow up. Will continue to follow acutely as admitted.    Follow Up Recommendations  No OT follow up    Equipment Recommendations  None recommended by OT    Recommendations for Other Services PT consult    Precautions / Restrictions Precautions Precautions: Cervical Precaution Booklet Issued: No Precaution Comments: reviewed precautions Required Braces or Orthoses: Cervical Brace Cervical Brace: Hard collar;At all times Restrictions Weight Bearing Restrictions: No       Mobility Bed Mobility Overal bed mobility: Modified Independent Bed Mobility: Rolling;Sidelying to Sit Rolling: Modified independent (Device/Increase time) Sidelying to sit: Modified independent (Device/Increase time)       General bed mobility comments: Pt able to perform bed mobility with mod I, however required min VCs for using log rolling method  Transfers Overall transfer level: Needs assistance Equipment used:  None Transfers: Sit to/from Stand Sit to Stand: Supervision         General transfer comment: pt quick to stand but steady    Balance Overall balance assessment: Mild deficits observed, not formally tested                                         ADL either performed or assessed with clinical judgement   ADL Overall ADL's : Needs assistance/impaired     Grooming: Wash/dry hands;Modified independent;Standing           Upper Body Dressing : Moderate assistance;Bed level;Sitting;Min guard Upper Body Dressing Details (indicate cue type and reason): Provided education for brace management and compensatory strategies for compensatory dressing. Pt verbalized brace management, and required mod A for donning/doffing cervical collar. Pt required min guard A and VCs for donning/doffing shirt for safety and maintaining precautions     Toilet Transfer: Modified Independent;Ambulation;Regular Toilet   Toileting- Clothing Manipulation and Hygiene: Modified independent;Sit to/from stand       Functional mobility during ADLs: Supervision/safety General ADL Comments: Required min A- mod A for safety and maintaining cervical precautions during dressing and brace management. Mod I for toileting and hand hygiene     Vision       Perception     Praxis      Cognition Arousal/Alertness: Awake/alert Behavior During Therapy: Anxious Overall Cognitive Status: Within Functional Limits for tasks assessed  Exercises     Shoulder Instructions       General Comments      Pertinent Vitals/ Pain       Pain Assessment: 0-10 Pain Score: 9  Pain Location: neck Pain Descriptors / Indicators: Discomfort;Sore;Aching;Operative site guarding("feels like a weight pressing down") Pain Intervention(s): Limited activity within patient's tolerance;Monitored during session;Repositioned  Home Living Family/patient expects to  be discharged to:: [facility] Arrangements: Other (Comment)                                      Prior Functioning/Environment              Frequency  Min 2X/week        Progress Toward Goals  OT Goals(current goals can now be found in the care plan section)  Progress towards OT goals: Progressing toward goals  Acute Rehab OT Goals Patient Stated Goal: decrease pain OT Goal Formulation: With patient Time For Goal Achievement: 12/29/18 Potential to Achieve Goals: Good ADL Goals Pt Will Perform Grooming: with modified independence;standing Pt Will Perform Upper Body Dressing: with modified independence;sitting Pt Will Perform Lower Body Dressing: with modified independence;sit to/from stand Pt Will Transfer to Toilet: with modified independence;ambulating;regular height toilet Pt Will Perform Toileting - Clothing Manipulation and hygiene: with modified independence;sit to/from stand Additional ADL Goal #1: Pt will verbalize 3/3 cervical precautions with 1 verbal cue Additional ADL Goal #2: Pt will perform bed mobility in preparation for ADLs with supervision  Plan Discharge plan remains appropriate    Co-evaluation                 AM-PAC OT "6 Clicks" Daily Activity     Outcome Measure   Help from another person eating meals?: None Help from another person taking care of personal grooming?: None Help from another person toileting, which includes using toliet, bedpan, or urinal?: None Help from another person bathing (including washing, rinsing, drying)?: None Help from another person to put on and taking off regular upper body clothing?: A Little Help from another person to put on and taking off regular lower body clothing?: None 6 Click Score: 23    End of Session Equipment Utilized During Treatment: Cervical collar  OT Visit Diagnosis: Pain;Other abnormalities of gait and mobility (R26.89) Pain - part of body: Shoulder(Neck  and Bilateral shoulders)   Activity Tolerance Patient tolerated treatment well   Patient Left in bed;with call bell/phone within reach;Other (comment)(Police officer present)   Nurse Communication Mobility status        Time: [id number] OT Time Calculation (min): 25 min  Charges: OT General Charges $OT Visit: 1 Visit OT Treatments $Self Care/Home Management : 8-22 mins $Therapeutic Activity: 8-22 mins  Sandrea Hammond, OT Student  Sandrea Hammond 12/17/2018, 4:41 PM

## 2018-12-17 NOTE — Progress Notes (Signed)
PROGRESS NOTE    David Short  AOZ:[id number]  DOB: 1970/03/26  DOA: 12/12/2018 PCP: Default, Provider, MD  Brief Narrative:  48 year old Caucasian male with past medical history significant for IV heroin and cocaine abuse, chronic back pain and prior fractures of left arm and left ankle treated operatively, now presented from jail with complaints of neck and back pain.Underwent CSF examination which showed some pleocytosis,  Admitted initially with tentative diagnosis of aseptic meningitis -on IV broad-spectrum IV antibiotics as well as antiviral medication.Blood cultures come back positive for Serratia. MRI brain / MRI C-spine with and without contrast was performed which showed evidence of C5-C6 discitis as well as osteomyelitis with ventral phlegmon/epidural abscess.  [facility] for further assessment, management and surgery. Patient underwent decompressive anterior cervical discectomy C5-6, anterior cervical arthrodesisC5-6utilizing a cortico-cancellusbone graft and anterior cervical plating C5-6utilizing a Alphatecplate on 12/14/2018 by Neurosurgery.  Infectious disease team is directing antibiotics management. Neurosurgery following along.  Subjective:  Patient resting , in no acute distress but continues to c/o neck pain, been getting oxycodone quiet frequently at 10mg  dose. Police officer bedside Objective: Vitals:   12/16/18 2333 12/17/18 0347 12/17/18 0500 12/17/18 0822  BP: 127/88 (!) 148/98  132/84  Pulse: (!) 101 100  (!) 106  Resp:    18  Temp: 97.6 F (36.4 C) 97.6 F (36.4 C)  97.9 F (36.6 C)  TempSrc: Oral Oral  Oral  SpO2: 91% 91%  93%  Weight:   71.5 kg   Height:        Intake/Output Summary (Last 24 hours) at 12/17/2018 1041 Last data filed at 12/16/2018 1332 Gross per 24 hour  Intake 240 ml  Output -  Net 240 ml   Filed Weights   12/13/18 2235 12/15/18 0500 12/17/18 0500  Weight: 63.4 kg 67.5 kg 71.5 kg     Physical Examination:  General exam: Appears calm and comfortable  Head ENT: Neck collar in place.  Dry dressing at surgical site Respiratory system: Clear to auscultation. Respiratory effort normal. Cardiovascular system: S1 & S2 heard, RRR. No JVD, murmurs, rubs, gallops or clicks. No pedal edema. Gastrointestinal system: Abdomen is nondistended, soft and nontender. No organomegaly or masses felt. Normal bowel sounds heard. Central nervous system: Alert and oriented. No focal neurological deficits. Extremities: Symmetric , no leg edema Skin: No rashes, lesions or ulcers Psychiatry:  Mood & affect anxious    Data Reviewed: I have personally reviewed following labs and imaging studies  CBC: Recent Labs  Lab 12/12/18 0920 12/13/18 0522 12/14/18 0808 12/16/18 0734  WBC 7.1 6.0 9.4 8.1  NEUTROABS 4.6 4.9 7.3 5.1  HGB 11.0* 10.7* 11.5* 11.3*  HCT 35.4* 34.4* 35.3* 35.5*  MCV 80.8 80.0 78.6* 81.2  PLT 140* 166 185 219   Basic Metabolic Panel: Recent Labs  Lab 12/12/18 0920 12/13/18 0522 12/14/18 0808 12/16/18 0734  NA 139 140 137 139  K 3.5 4.4 4.2 3.9  CL 104 109 103 103  CO2 25 21* 24 25  GLUCOSE 82 175* 90 124*  BUN 7 13 15 17   CREATININE 0.54* 0.45* 0.65 0.61  CALCIUM 9.2 9.4 9.8 9.2  MG  --   --  1.9 1.8  PHOS  --   --   --  2.9   GFR: Estimated Creatinine Clearance: 113 mL/min (by C-G formula based on SCr of 0.61 mg/dL). Liver Function Tests: Recent Labs  Lab 12/13/18 0522 12/14/18 0808 12/16/18 0734  AST 14*  17  --   ALT 15 20  --   ALKPHOS 76 77  --   BILITOT 0.3 0.4  --   PROT 7.3 7.6  --   ALBUMIN 3.2* 3.3* 2.8*   No results for input(s): LIPASE, AMYLASE in the last 168 hours. No results for input(s): AMMONIA in the last 168 hours. Coagulation Profile: Recent Labs  Lab 12/14/18 0808  INR 1.0   Cardiac Enzymes: No results for input(s): CKTOTAL, CKMB, CKMBINDEX, TROPONINI in the last 168 hours. BNP (last 3 results) No results for input(s):  PROBNP in the last 8760 hours. HbA1C: No results for input(s): HGBA1C in the last 72 hours. CBG: No results for input(s): GLUCAP in the last 168 hours. Lipid Profile: No results for input(s): CHOL, HDL, LDLCALC, TRIG, CHOLHDL, LDLDIRECT in the last 72 hours. Thyroid Function Tests: No results for input(s): TSH, T4TOTAL, FREET4, T3FREE, THYROIDAB in the last 72 hours. Anemia Panel: No results for input(s): VITAMINB12, FOLATE, FERRITIN, TIBC, IRON, RETICCTPCT in the last 72 hours. Sepsis Labs: Recent Labs  Lab 12/12/18 0920  LATICACIDVEN 0.9    Recent Results (from the past 240 hour(s))  Blood culture (routine x 2)     Status: Abnormal   Collection Time: 12/12/18  9:20 AM   Specimen: BLOOD  Result Value Ref Range Status   Specimen Description   Final    BLOOD LEFT ANTECUBITAL Performed at [facility] [address] [place], [place] [address]    Special Requests   Final    BOTTLES DRAWN AEROBIC AND ANAEROBIC Blood Culture results may not be optimal due to an excessive volume of blood received in culture bottles Performed at [facility] [address], [place], [place] [address]    Culture  Setup Time   Final    GRAM NEGATIVE RODS IN BOTH AEROBIC AND ANAEROBIC BOTTLES CRITICAL RESULT CALLED TO, READ BACK BY AND VERIFIED WITH: [place] [id number] FCP Performed at [facility], [place] [address], [place], [place] [address]    Culture SERRATIA MARCESCENS (A)  Final   Report Status 12/17/2018 FINAL  Final   Organism ID, Bacteria SERRATIA MARCESCENS  Final      Susceptibility   Serratia marcescens - MIC*    CEFAZOLIN >=64 RESISTANT Resistant     CEFEPIME <=1 SENSITIVE Sensitive     CEFTAZIDIME <=1 SENSITIVE Sensitive     CEFTRIAXONE <=1 SENSITIVE Sensitive     CIPROFLOXACIN <=0.25 SENSITIVE Sensitive     GENTAMICIN <=1 SENSITIVE Sensitive     TRIMETH/SULFA <=20 SENSITIVE Sensitive     * SERRATIA MARCESCENS  Blood culture  (routine x 2)     Status: None   Collection Time: 12/12/18  9:20 AM   Specimen: BLOOD LEFT HAND  Result Value Ref Range Status   Specimen Description   Final    BLOOD LEFT HAND Performed at [facility], [place] [address], [place], [place] [address]    Special Requests   Final    BOTTLES DRAWN AEROBIC ONLY Blood Culture results may not be optimal due to an inadequate volume of blood received in culture bottles Performed at [facility] [address], [place], [place] [address]    Culture   Final    NO GROWTH 5 DAYS Performed at [facility], [place] [address], [place], [place] [address]    Report Status 12/17/2018 FINAL  Final  SARS CORONAVIRUS 2 (TAT 6-24 HRS) Nasopharyngeal  Nasopharyngeal Swab     Status: None   Collection Time: 12/12/18  9:20 AM   Specimen: Nasopharyngeal Swab  Result Value Ref Range Status   SARS Coronavirus 2 NEGATIVE NEGATIVE Final    Comment: (NOTE) SARS-CoV-2 target nucleic acids are NOT DETECTED. The SARS-CoV-2 RNA is generally detectable in upper and lower respiratory specimens during the acute phase of infection. Negative results do not preclude SARS-CoV-2 infection, do not rule out co-infections with other pathogens, and should not be used as the sole basis for treatment or other patient management decisions. Negative results must be combined with clinical observations, patient history, and epidemiological information. The expected result is Negative. Fact Sheet for Patients: HairSlick.no Fact Sheet for Healthcare Providers: quierodirigir.com This test is not yet approved or cleared by the [place] FDA and  has been authorized for detection and/or diagnosis of SARS-CoV-2 by FDA under an Emergency Use Authorization (EUA). This EUA will remain  in effect (meaning this test can be used) for the duration of the COVID-19 declaration under Section 56 4(b)(1) of the Act,  21 U.S.C. section 360bbb-3(b)(1), unless the authorization is terminated or revoked sooner. Performed at [facility], [address], [place], [place] [address]   Blood Culture ID Panel (Reflexed)     Status: Abnormal   Collection Time: 12/12/18  9:20 AM  Result Value Ref Range Status   Enterococcus species NOT DETECTED NOT DETECTED Final   Listeria monocytogenes NOT DETECTED NOT DETECTED Final   Staphylococcus species NOT DETECTED NOT DETECTED Final   Staphylococcus aureus (BCID) NOT DETECTED NOT DETECTED Final   Streptococcus species NOT DETECTED NOT DETECTED Final   Streptococcus agalactiae NOT DETECTED NOT DETECTED Final   Streptococcus pneumoniae NOT DETECTED NOT DETECTED Final   Streptococcus pyogenes NOT DETECTED NOT DETECTED Final   Acinetobacter baumannii NOT DETECTED NOT DETECTED Final   Enterobacteriaceae species DETECTED (A) NOT DETECTED Final    Comment: Enterobacteriaceae represent a large family of gram-negative bacteria, not a single organism. CRITICAL RESULT CALLED TO, READ BACK BY AND VERIFIED WITH: PHARMD Janine Limbo [id number] FCP    Enterobacter cloacae complex NOT DETECTED NOT DETECTED Final   Escherichia coli NOT DETECTED NOT DETECTED Final   Klebsiella oxytoca NOT DETECTED NOT DETECTED Final   Klebsiella pneumoniae NOT DETECTED NOT DETECTED Final   Proteus species NOT DETECTED NOT DETECTED Final   Serratia marcescens DETECTED (A) NOT DETECTED Final    Comment: CRITICAL RESULT CALLED TO, READ BACK BY AND VERIFIED WITH: PHARMD Janine Limbo [phone] FCP    Carbapenem resistance NOT DETECTED NOT DETECTED Final   Haemophilus influenzae NOT DETECTED NOT DETECTED Final   Neisseria meningitidis NOT DETECTED NOT DETECTED Final   Pseudomonas aeruginosa NOT DETECTED NOT DETECTED Final   Candida albicans NOT DETECTED NOT DETECTED Final   Candida glabrata NOT DETECTED NOT DETECTED Final   Candida krusei NOT DETECTED NOT DETECTED Final   Candida parapsilosis NOT  DETECTED NOT DETECTED Final   Candida tropicalis NOT DETECTED NOT DETECTED Final    Comment: Performed at [facility], [address] [place], [place] [address]  CSF culture     Status: None   Collection Time: 12/12/18 12:21 PM   Specimen: Back; Cerebrospinal Fluid  Result Value Ref Range Status   Specimen Description   Final    BACK CSF Performed at [facility], [address], [place], [place] [address]    Special Requests   Final  NONE Performed at [facility], [address] [place], [place] [address]    Gram Stain   Final    WBC PRESENT, PREDOMINANTLY MONONUCLEAR NO ORGANISMS SEEN CYTOSPIN SMEAR Gram Stain Report Called to,Read Back By and Verified With: CALLED TO Katha Cabal, RN ON 12/12/18 @ 1317 BY LE Performed at [facility], [address] [place] [address], [place], [place] [address]    Culture   Final    NO GROWTH 3 DAYS Performed at [facility], [address] [place], [place] [address]    Report Status 12/16/2018 FINAL  Final  MRSA PCR Screening     Status: None   Collection Time: 12/12/18  9:23 PM   Specimen: Nasal Mucosa; Nasopharyngeal  Result Value Ref Range Status   MRSA by PCR NEGATIVE NEGATIVE Final    Comment:        The GeneXpert MRSA Assay (FDA approved for NASAL specimens only), is one component of a comprehensive MRSA colonization surveillance program. It is not intended to diagnose MRSA infection nor to guide or monitor treatment for MRSA infections. Performed at [facility], [address], [place], [place] [address]   Culture, blood (routine x 2)     Status: None (Preliminary result)   Collection Time: 12/13/18 11:11 AM   Specimen: BLOOD  Result Value Ref Range Status   Specimen Description   Final    BLOOD RIGHT ARM Performed at [facility], [address] [place], [place] [address]    Special Requests   Final    BOTTLES DRAWN AEROBIC  AND ANAEROBIC Blood Culture adequate volume Performed at [facility], [address] [place], [place] [address]    Culture   Final    NO GROWTH 4 DAYS Performed at [facility], [address] [place], [place] [address]    Report Status PENDING  Incomplete  Culture, blood (routine x 2)     Status: None (Preliminary result)   Collection Time: 12/13/18 11:28 AM   Specimen: BLOOD LEFT HAND  Result Value Ref Range Status   Specimen Description   Final    BLOOD LEFT HAND Blood Culture adequate volume Performed at [facility], [address] [place], [place] [address]    Special Requests   Final    NONE Performed at [facility], [address], [place], [place] [address]    Culture   Final    NO GROWTH 4 DAYS Performed at [facility], [address] [place], [place] [address]    Report Status PENDING  Incomplete  Aerobic/Anaerobic Culture (surgical/deep wound)     Status: None (Preliminary result)   Collection Time: 12/14/18 11:11 AM   Specimen: Wound; Tissue  Result Value Ref Range Status   Specimen Description WOUND ANTERIOR CERVICAL SIX CORPECTOMY FUSION  Final   Special Requests A  Final   Gram Stain   Final    NO WBC SEEN NO ORGANISMS SEEN Performed at [facility], [address] [place], [place] [address]    Culture   Final    FEW SERRATIA MARCESCENS NO ANAEROBES ISOLATED; CULTURE IN PROGRESS FOR 5 DAYS    Report Status PENDING  Incomplete   Organism ID, Bacteria SERRATIA MARCESCENS  Final      Susceptibility   Serratia marcescens - MIC*    CEFAZOLIN >=64 RESISTANT Resistant     CEFEPIME <=1 SENSITIVE Sensitive  CEFTAZIDIME <=1 SENSITIVE Sensitive     CEFTRIAXONE <=1 SENSITIVE Sensitive     CIPROFLOXACIN <=0.25 SENSITIVE Sensitive     GENTAMICIN <=1 SENSITIVE Sensitive     TRIMETH/SULFA <=20 SENSITIVE Sensitive     * FEW SERRATIA MARCESCENS      Radiology Studies: Dg  Cervical Spine 1 View  Result Date: 12/16/2018 CLINICAL DATA:  Post cervical spine fusion EXAM: DG CERVICAL SPINE - 1 VIEW COMPARISON:  Single lateral view compared to 12/14/2018 FINDINGS: Anterior plate and screws identified at C5-C6. Disc prosthesis present at C5-C6 disc space. Low normal osseous mineralization. No fracture, subluxation, or bone destruction identified on upright in collar view. IMPRESSION: Post anterior fusion of C5-C6. Electronically Signed   By: Ulyses Southward M.D.   On: 12/16/2018 08:43        Scheduled Meds: . feeding supplement (ENSURE ENLIVE)  237 mL Oral BID BM  . levofloxacin  750 mg Oral Daily  . senna  1 tablet Oral BID  . sodium chloride flush  3 mL Intravenous Q12H   Continuous Infusions: . sodium chloride      Assessment & Plan:    1. 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  Seen by infectious diseases for follow-up and recommend transition to oral antibiotics at the time of discharge: Potentially Levaquin 750 mg daily until November 22.  EKG for QTC obtained today and okay at 435 msec. Neurosurgery following along and repeat neck x rays ordered on 9/29.    PT/OT evaluation appreciated, no f/u recommended on d/c. [facility] at prison [phone]) who advised that patient cannot go there on opiates. The max they accept with Tylenol-3 with codeine. Patient on Oxycodone  q3hr prn--will start tapering him off. d/c IV morphine, d/c IV methocarbamol.  2.  Serratia bacteremia: Blood cultures and wound cultures growing Serratia.  Levaquin course as discussed above.    3.  Polysubstance abuse:  Urine drug screen unremarkable on presentation.  Has history of cocaine/heroin abuse.Watch for signs of withdrawal.  Currently on opiates but in the setting of postoperative pain-discussed with patient regarding slow taper with nearing discharge plan  4.  Elevated blood pressure: In the setting of problem #1, pain and history  of drug use.  Currently normotensive.  D/c IV fluids.  Continue to monitor without meds  5. Mild thrombocytopenia: Present on admission in the setting of problem #1.  Now resolved  DVT prophylaxis: Lovenox Code Status: Full code Family / Patient Communication: Discussed with patient. Disposition Plan: He states he has a court date on October 12 at which time he anticipates to be released from jail.  Contacted infirmary and discharge plan once pain meds de-escalated in next 48 hrs possibly.     LOS: 5 days    Time spent: 35 minutes    Alessandra Bevels, MD [facility] Pager [phone]  If 7PM-7AM, please contact night-coverage [email or web address] Password [email or web address] 12/17/2018, 10:41 AM

## 2018-12-17 NOTE — Progress Notes (Signed)
Patient ID: David Short, male   DOB: 1970-05-19, 48 y.o.   MRN: [id number] Patient has appropriate neck soreness.  The oxycodone helps.  Neurologic exam is nonfocal.  His dressing is dry and flat.  He remains in his collar.  Plain film of cervical spine yesterday look good with no complicating features.  He is stating that he may be discharged as early as Friday on oral antibiotics.  1.  I am not an infectious disease physician and cannot speak to the efficacy of oral Levaquin versus IV Rocephin for Serratia osteomyelitis discitis and epidural abscess, but I would be extremely hesitant to convert him to oral antibiotics this soon.  I suspect this will significantly increase the risk of failure of treatment, and therefore failure of the construct and increase the risk of neurologic compromise and the need for further much more complex surgery.  I admit I am biased because I only see the failures of this treatment as a surgeon, but converting him to oral antibiotics this soon after my surgery for epidural abscess concerns me greatly.  2.  It is going to be difficult to wean him to Tylenol 3 quickly given his history of heroin abuse.  His mu receptors are likely quite down regulated and he is probably quite tolerant to opioids, and therefore he will likely need more pain medication than the average patient in the early stages after surgery  Thank you for your consideration in these matters

## 2018-12-17 NOTE — Progress Notes (Signed)
I have rescheduled his appt with me to two weeks so I can closely monitor his labs.  He is scheduled to see me 10/19.   Thayer Headings, MD

## 2018-12-18 LAB — CULTURE, BLOOD (ROUTINE X 2)
Culture: NO GROWTH
Culture: NO GROWTH
Special Requests: ADEQUATE
Specimen Description: ADEQUATE

## 2018-12-18 MED ORDER — OXYCODONE HCL 5 MG PO TABS
10.0000 mg | ORAL_TABLET | ORAL | Status: DC | PRN
  Administered 2018-12-18 – 2018-12-22 (×22): 20 mg via ORAL
  Administered 2018-12-23 (×2): 15 mg via ORAL
  Administered 2018-12-23: 10 mg via ORAL
  Administered 2018-12-23 (×2): 20 mg via ORAL
  Administered 2018-12-24 (×3): 10 mg via ORAL
  Filled 2018-12-18 (×3): qty 4
  Filled 2018-12-18 (×2): qty 2
  Filled 2018-12-18 (×4): qty 4
  Filled 2018-12-18: qty 2
  Filled 2018-12-18 (×10): qty 4
  Filled 2018-12-18: qty 2
  Filled 2018-12-18: qty 4
  Filled 2018-12-18 (×2): qty 3
  Filled 2018-12-18 (×7): qty 4

## 2018-12-18 NOTE — Progress Notes (Signed)
PROGRESS NOTE  SARP VERNIER UXL:[id number] DOB: Mar 08, 1971 DOA: 12/12/2018 PCP: Default, Provider, MD  Brief History   48 year old Caucasian male with past medical history significant for IV heroin and cocaine abuse, chronic back pain and prior fractures of left arm and left ankle treated operatively, now presented from jail with complaints of neck and back pain.Underwent CSF examination which showed some pleocytosis,  Admitted initially with tentative diagnosis of aseptic meningitis -on IV broad-spectrum IV antibiotics as well as antiviral medication.Blood cultures come back positive for Serratia. MRI brain / MRI C-spine with and without contrast was performed which showed evidence of C5-C6 discitis as well as osteomyelitis with ventral phlegmon/epidural abscess. Patient was transferred to [facility] for further assessment, management and surgery. Patient underwent decompressive anterior cervical discectomy C5-6, anterior cervical arthrodesisC5-6utilizing a cortico-cancellusbone graft and anterior cervical plating C5-6utilizing a Alphatecplateon 12/14/2018 by Neurosurgery.Infectious disease team is directing antibiotics management. Neurosurgery following along.  Consultants  . Neurosurgery . Infectious Disease  Procedures  . Decompressive anterior cervical discectomy, C5-C6.  Marland Kitchen Anterior cervical arthrodesis C5- C6 utilizing a cortico-cancellus bone graft . Anterior cervical plating C5-C6 utilizing an alphate plate.  Antibiotics   Anti-infectives (From admission, onward)   Start     Dose/Rate Route Frequency Ordered Stop   12/17/18 1045  levofloxacin (LEVAQUIN) tablet 750 mg     750 mg Oral Daily 12/17/18 1040     12/16/18 1230  cefTRIAXone (ROCEPHIN) 2 g in sodium chloride 0.9 % 100 mL IVPB  Status:  Discontinued     2 g 200 mL/hr over 30 Minutes Intravenous Every 24 hours 12/16/18 0922 12/17/18 1040   12/14/18 1028  bacitracin 50,000 Units in sodium chloride 0.9 %  500 mL irrigation  Status:  Discontinued       As needed 12/14/18 1028 12/14/18 1212   12/14/18 0941  ceFAZolin (ANCEF) 2-4 GM/100ML-% IVPB    Note to Pharmacy: Aquilla Hacker   : cabinet override      12/14/18 0941 12/14/18 2144   12/13/18 2000  ceFEPIme (MAXIPIME) 2 g in sodium chloride 0.9 % 100 mL IVPB  Status:  Discontinued     2 g 200 mL/hr over 30 Minutes Intravenous Every 8 hours 12/13/18 1054 12/13/18 1056   12/13/18 2000  ceFEPIme (MAXIPIME) 2 g in sodium chloride 0.9 % 100 mL IVPB  Status:  Discontinued     2 g 200 mL/hr over 30 Minutes Intravenous Every 8 hours 12/13/18 1056 12/16/18 0922   12/13/18 1100  ceFEPIme (MAXIPIME) 2 g in sodium chloride 0.9 % 100 mL IVPB  Status:  Discontinued     2 g 200 mL/hr over 30 Minutes Intravenous STAT 12/13/18 1054 12/13/18 1054   12/13/18 1100  ceFEPIme (MAXIPIME) 2 g in sodium chloride 0.9 % 100 mL IVPB     2 g 200 mL/hr over 30 Minutes Intravenous STAT 12/13/18 1055 12/13/18 1207   12/13/18 0600  cefTRIAXone (ROCEPHIN) 2 g in sodium chloride 0.9 % 100 mL IVPB  Status:  Discontinued     2 g 200 mL/hr over 30 Minutes Intravenous Every 12 hours 12/12/18 1715 12/13/18 1042   12/13/18 0200  vancomycin (VANCOCIN) IVPB 750 mg/150 ml premix  Status:  Discontinued     750 mg 150 mL/hr over 60 Minutes Intravenous Every 8 hours 12/12/18 1740 12/13/18 0930   12/12/18 1745  acyclovir (ZOVIRAX) 635 mg in dextrose 5 % 100 mL IVPB  Status:  Discontinued     10 mg/kg  63.5  kg 112.7 mL/hr over 60 Minutes Intravenous Every 8 hours 12/12/18 1740 12/13/18 1042   12/12/18 1715  vancomycin (VANCOCIN) IVPB 1000 mg/200 mL premix  Status:  Discontinued     1,000 mg 200 mL/hr over 60 Minutes Intravenous  Once 12/12/18 1715 12/12/18 1724   12/12/18 1500  acyclovir (ZOVIRAX) 635 mg in dextrose 5 % 100 mL IVPB     10 mg/kg  63.5 kg 112.7 mL/hr over 60 Minutes Intravenous  Once 12/12/18 1350 12/12/18 1824   12/12/18 1430  vancomycin (VANCOCIN) 1,250 mg in sodium  chloride 0.9 % 250 mL IVPB     1,250 mg 166.7 mL/hr over 90 Minutes Intravenous  Once 12/12/18 1350 12/12/18 1657   12/12/18 1400  cefTRIAXone (ROCEPHIN) 2 g in sodium chloride 0.9 % 100 mL IVPB     2 g 200 mL/hr over 30 Minutes Intravenous  Once 12/12/18 1350 12/12/18 1657    .  Marland Kitchen   Subjective  The patient is complaining of sever pain in his neck and weakness. Pain medications had been scaled back.  Objective   Vitals:  Vitals:   12/18/18 1145 12/18/18 1536  BP: 121/82 122/86  Pulse: 98 (!) 109  Resp: 18 16  Temp: 97.8 F (36.6 C) 98.3 F (36.8 C)  SpO2: 90% 90%   Exam:  Constitutional:  . The patient is awake, alert, and oriented x 3. The patient is complaining of severe neck pain and weakness. Respiratory:  . No increased work of breathing. . No wheezes, rales, or rhonchi . No tactile fremitus Cardiovascular:  . Regular rate and rhythm . No murmurs, ectopy, or gallups. . No lateral PMI. No thrills. Abdomen:  . Abdomen is soft, non-tender, non-distended . No hernias, masses, or organomegaly . Normoactive bowel sounds.  Musculoskeletal:  . No cyanosis, clubbing, or edema Skin:  . No rashes, lesions, ulcers . palpation of skin: no induration or nodules Neurologic:  . CN 2-12 intact . Sensation all 4 extremities intact Psychiatric:  . Mental status o Mood, affect appropriate o Orientation to person, place, time  . judgment and insight appear intact  I have personally reviewed the following:   Today's Data  . Vitals, Blood Cultures.  Scheduled Meds: . feeding supplement (ENSURE ENLIVE)  237 mL Oral BID BM  . levofloxacin  750 mg Oral Daily  . senna  1 tablet Oral BID  . sodium chloride flush  3 mL Intravenous Q12H   Continuous Infusions: . 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  Seen by infectious diseases for follow-up and recommend transition to oral antibiotics at the time of discharge: Potentially Levaquin 750 mg daily until November 22. However, NS has expressed concerns about failure of this agent in the past. EKG for QTC obtained today and okay at 435 msec. Neurosurgery following along and repeat neck x rays ordered on 9/29.    PT/OT evaluation appreciated, no f/u recommended on d/c. [facility] at prison [phone]) who advised that patient cannot go there on opiates. The max they accept with Tylenol-3 with codeine. Patient on Oxycodone 10mg  q3hr prn. The patient's narcotics are not  currently sufficient to relieve his pain. He is also receiving toradol.  Serratia bacteremia: Blood cultures and wound cultures growing Serratia.  Levaquin course as discussed above.  However, NS has expressed concerns about failure of this agent in treating vertebral osteomyelitis in the past.   Polysubstance abuse:  Urine drug screen unremarkable on presentation.  Has history of cocaine/heroin abuse.Watch for signs of withdrawal.  Currently on opiates but in the setting of postoperative pain-discussed with patient regarding slow taper with nearing discharge plan  Elevated blood pressure: In the setting of problem #1, pain and history of drug use.  Currently normotensive.  D/c IV fluids.  Continue to monitor without meds  Mild thrombocytopenia: Present on admission in the setting of C5-C6 osteomyelitis and serratia bacteremia.  Now resolved.  I have seen and examined this patient myself. I have spent 35 minutes in his evaluation and care.  DVT prophylaxis: Lovenox Code Status: Full code Family / Patient Communication: Discussed with patient. Disposition Plan: He states he has a court date on October 12 at which time he anticipates to  be released from jail.  Contacted infirmary and discharge plan once pain meds de-escalated.Fran Lowes.  Arlinda Barcelona, DO [facility] Direct contact: see [email or web address]  7PM-7AM contact night coverage as above 12/18/2018, 6:53 PM  LOS: 6 days

## 2018-12-19 LAB — AEROBIC/ANAEROBIC CULTURE W GRAM STAIN (SURGICAL/DEEP WOUND): Gram Stain: NONE SEEN

## 2018-12-19 NOTE — Progress Notes (Signed)
Nutrition Follow-up   RD working remotely.  DOCUMENTATION CODES:   Non-severe (moderate) malnutrition in context of chronic illness  INTERVENTION:  Continue Ensure Enlive po BID, each supplement provides 350 kcal and 20 grams of protein   Encourage adequate PO intake.   NUTRITION DIAGNOSIS:   Moderate Malnutrition related to chronic illness(drug abuse) as evidenced by moderate fat depletion, moderate muscle depletion; ongoing  GOAL:   Patient will meet greater than or equal to 90% of their needs; met  MONITOR:   PO intake, Supplement acceptance, Skin, Weight trends, Labs, I & O's  REASON FOR ASSESSMENT:   Malnutrition Screening Tool    ASSESSMENT:   48 year old Caucasian male with past medical history significant for IV heroin and cocaine abuse, chronic back pain and prior fractures of left arm and left ankle treated operatively.  Patient was admitted with complaints of neck and back pain.  Work-up done revealed osteomyelitis of C5-C6 spine with a phlegmon.  Patient was transferred to [facility] for further assessment, management and surgery.  Patient came from jail.  PROCEDURE (9/28):  Decompressive anterior cervical discectomy C5-6 Anterior cervical arthrodesisC5-6utilizing a cortico-cancellusbone graft Anterior cervical plating C5-6utilizing a Alphatecplate    Meal completion has been 100%. Appetite has been good. Pt currently has Ensure ordered and has been consuming them. RD to continue with current orders to aid in increased caloric and protein needs as well as in healing.  Labs and medications reviewed.  Diet Order:   Diet Order            Diet regular Room service appropriate? Yes with Assist; Fluid consistency: Thin  Diet effective now              EDUCATION NEEDS:   Not appropriate for education at this time  Skin:  Skin Assessment: Skin Integrity Issues: Skin Integrity Issues:: Incisions Incisions: neck  Last BM:  10/1  Height:    Ht Readings from Last 1 Encounters:  12/13/18 '5\' 10"'  (1.778 m)    Weight:   Wt Readings from Last 1 Encounters:  12/18/18 71.2 kg    Ideal Body Weight:  75.45 kg  BMI:  Body mass index is 22.52 kg/m.  Estimated Nutritional Needs:   Kcal:  1900-2100  Protein:  90-105 grams  Fluid:  1.9 - 2.1 L/day    Corrin Parker, MS, RD, LDN Pager # [phone] After hours/ weekend pager # [phone]

## 2018-12-19 NOTE — Progress Notes (Signed)
Patient ID: David Short, male   DOB: 10/01/1970, 48 y.o.   MRN: [id number] Looks comfortable, MAEx4, collar in place, dressing dry, pain control remains the issue

## 2018-12-19 NOTE — Progress Notes (Signed)
PROGRESS NOTE  David Short:[id number] DOB: 1970-04-06 DOA: 12/12/2018 PCP: Default, Provider, MD  Brief History   48 year old Caucasian male with past medical history significant for IV heroin and cocaine abuse, chronic back pain and prior fractures of left arm and left ankle treated operatively, now presented from jail with complaints of neck and back pain.Underwent CSF examination which showed some pleocytosis,  Admitted initially with tentative diagnosis of aseptic meningitis -on IV broad-spectrum IV antibiotics as well as antiviral medication.Blood cultures come back positive for Serratia. MRI brain / MRI C-spine with and without contrast was performed which showed evidence of C5-C6 discitis as well as osteomyelitis with ventral phlegmon/epidural abscess. Patient was transferred to [facility] for further assessment, management and surgery. Patient underwent decompressive anterior cervical discectomy C5-6, anterior cervical arthrodesisC5-6utilizing a cortico-cancellusbone graft and anterior cervical plating C5-6utilizing a Alphatecplateon 12/14/2018 by Neurosurgery.Infectious disease team is directing antibiotics management. Neurosurgery following along.  Consultants  . Neurosurgery . Infectious Disease  Procedures  . Decompressive anterior cervical discectomy, C5-C6.  Marland Kitchen Anterior cervical arthrodesis C5- C6 utilizing a cortico-cancellus bone graft . Anterior cervical plating C5-C6 utilizing an alphate plate.  Antibiotics   Anti-infectives (From admission, onward)   Start     Dose/Rate Route Frequency Ordered Stop   12/17/18 1045  levofloxacin (LEVAQUIN) tablet 750 mg     750 mg Oral Daily 12/17/18 1040     12/16/18 1230  cefTRIAXone (ROCEPHIN) 2 g in sodium chloride 0.9 % 100 mL IVPB  Status:  Discontinued     2 g 200 mL/hr over 30 Minutes Intravenous Every 24 hours 12/16/18 0922 12/17/18 1040   12/14/18 1028  bacitracin 50,000 Units in sodium chloride 0.9 %  500 mL irrigation  Status:  Discontinued       As needed 12/14/18 1028 12/14/18 1212   12/14/18 0941  ceFAZolin (ANCEF) 2-4 GM/100ML-% IVPB    Note to Pharmacy: Aquilla Hacker   : cabinet override      12/14/18 0941 12/14/18 2144   12/13/18 2000  ceFEPIme (MAXIPIME) 2 g in sodium chloride 0.9 % 100 mL IVPB  Status:  Discontinued     2 g 200 mL/hr over 30 Minutes Intravenous Every 8 hours 12/13/18 1054 12/13/18 1056   12/13/18 2000  ceFEPIme (MAXIPIME) 2 g in sodium chloride 0.9 % 100 mL IVPB  Status:  Discontinued     2 g 200 mL/hr over 30 Minutes Intravenous Every 8 hours 12/13/18 1056 12/16/18 0922   12/13/18 1100  ceFEPIme (MAXIPIME) 2 g in sodium chloride 0.9 % 100 mL IVPB  Status:  Discontinued     2 g 200 mL/hr over 30 Minutes Intravenous STAT 12/13/18 1054 12/13/18 1054   12/13/18 1100  ceFEPIme (MAXIPIME) 2 g in sodium chloride 0.9 % 100 mL IVPB     2 g 200 mL/hr over 30 Minutes Intravenous STAT 12/13/18 1055 12/13/18 1207   12/13/18 0600  cefTRIAXone (ROCEPHIN) 2 g in sodium chloride 0.9 % 100 mL IVPB  Status:  Discontinued     2 g 200 mL/hr over 30 Minutes Intravenous Every 12 hours 12/12/18 1715 12/13/18 1042   12/13/18 0200  vancomycin (VANCOCIN) IVPB 750 mg/150 ml premix  Status:  Discontinued     750 mg 150 mL/hr over 60 Minutes Intravenous Every 8 hours 12/12/18 1740 12/13/18 0930   12/12/18 1745  acyclovir (ZOVIRAX) 635 mg in dextrose 5 % 100 mL IVPB  Status:  Discontinued     10 mg/kg  63.5  kg 112.7 mL/hr over 60 Minutes Intravenous Every 8 hours 12/12/18 1740 12/13/18 1042   12/12/18 1715  vancomycin (VANCOCIN) IVPB 1000 mg/200 mL premix  Status:  Discontinued     1,000 mg 200 mL/hr over 60 Minutes Intravenous  Once 12/12/18 1715 12/12/18 1724   12/12/18 1500  acyclovir (ZOVIRAX) 635 mg in dextrose 5 % 100 mL IVPB     10 mg/kg  63.5 kg 112.7 mL/hr over 60 Minutes Intravenous  Once 12/12/18 1350 12/12/18 1824   12/12/18 1430  vancomycin (VANCOCIN) 1,250 mg in sodium  chloride 0.9 % 250 mL IVPB     1,250 mg 166.7 mL/hr over 90 Minutes Intravenous  Once 12/12/18 1350 12/12/18 1657   12/12/18 1400  cefTRIAXone (ROCEPHIN) 2 g in sodium chloride 0.9 % 100 mL IVPB     2 g 200 mL/hr over 30 Minutes Intravenous  Once 12/12/18 1350 12/12/18 1657     .   Subjective  The patient continues to complain of severe pain despite increasing the dose of oxycodone.   Objective   Vitals:  Vitals:   12/19/18 1225 12/19/18 1610  BP: 119/87 116/79  Pulse: (!) 107 (!) 108  Resp: 16 16  Temp: (!) 97.5 F (36.4 C) 97.7 F (36.5 C)  SpO2: 93% 90%   Exam:  Constitutional:  . The patient is awake, alert, and oriented x 3. The patient is complaining of severe neck pain and weakness. Respiratory:  . No increased work of breathing. . No wheezes, rales, or rhonchi . No tactile fremitus Cardiovascular:  . Regular rate and rhythm . No murmurs, ectopy, or gallups. . No lateral PMI. No thrills. Abdomen:  . Abdomen is soft, non-tender, non-distended . No hernias, masses, or organomegaly . Normoactive bowel sounds.  Musculoskeletal:  . No cyanosis, clubbing, or edema Skin:  . No rashes, lesions, ulcers . palpation of skin: no induration or nodules Neurologic:  . CN 2-12 intact . Sensation all 4 extremities intact Psychiatric:  . Mental status o Mood, affect appropriate o Orientation to person, place, time  . judgment and insight appear intact  I have personally reviewed the following:   Today's Data  . Vitals, Blood Cultures.  Scheduled Meds: . feeding supplement (ENSURE ENLIVE)  237 mL Oral BID BM  . levofloxacin  750 mg Oral Daily  . senna  1 tablet Oral BID  . sodium chloride flush  3 mL Intravenous Q12H   Continuous Infusions: . 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  Seen by infectious diseases for follow-up and recommend transition to oral antibiotics at the time of discharge: Potentially Levaquin 750 mg daily until November 22. However, NS has expressed concerns about failure of this agent for this indication in the past. EKG for QTC obtained today and okay at 435 msec. Neurosurgery following along. I appreciate their help.   Neck Pain: PT/OT evaluation appreciated, no f/u recommended on d/c. [facility] [phone]( [phone]) who advised that patient cannot go there on opiates. The max they accept with Tylenol-3 with codeine. Patient's Oxycodone has been increased to 10mg  - 20mg  q4hr prn. The patient's  narcotics are not currently sufficient to relieve his pain. He is also receiving toradol. I cannot discharge the patient currently as it is reported that he will not receive anything but Tylenol #3 at the prison.   Serratia bacteremia: Blood cultures and wound cultures growing Serratia.  Levaquin course as discussed above.  However, Neurosurgery has expressed concerns about failure of this agent in treating vertebral osteomyelitis in the past. The patient is continued on cefepime currently.  Polysubstance abuse:  Urine drug screen unremarkable on presentation.  Has history of cocaine/heroin abuse.Watch for signs of withdrawal.  Currently on opiates but in the setting of postoperative pain-discussed with patient regarding slow taper with nearing discharge plan. He will only be able to receive Tylenol #3 at the prison. He is currently having severe pain with Oxycodone 10-20 mg Q4h. This will be a slow process given the source of his pain and his habituation to narcotics.   Elevated blood pressure: In the setting of problem #1, pain and history of drug use.  Currently normotensive.  D/c IV fluids.  Continue to monitor without meds   Mild thrombocytopenia: Present on admission in the setting of C5-C6 osteomyelitis and serratia bacteremia.  Now resolved.  I have seen and examined this patient myself. I have spent 32 minutes in his evaluation and care.  DVT prophylaxis: Lovenox Code Status: Full code Family / Patient Communication: Discussed with patient. Disposition Plan: He states he has a court date on October 12 at which time he anticipates to be released from jail.  Contacted infirmary and discharge plan once pain meds de-escalated.Karie Kirks, DO [facility] Direct contact: see [email or web address]  7PM-7AM contact night coverage as above 12/19/2018, 6:09 PM  LOS: 6 days

## 2018-12-19 NOTE — Progress Notes (Signed)
Physical Therapy Treatment Patient Details Name: David Short MRN: [id number] DOB: 25-Jul-1970 Today's Date: 12/19/2018    History of Present Illness 48 year old Caucasian male with past medical history significant for IV heroin and cocaine abuse, chronic back pain and prior fractures of left arm and left ankle treated operatively.  Patient was admitted with complaints of neck and back pain.  Work-up done revealed osteomyelitis of C5-C6 spine with a phlegmon.  He underwent ACDF 12/14/18.    PT Comments    Patient progressing well towards PT goals. Adjusted cervical collar for a better fit. Reviewed cervical precautions as pt required cues to maintain during functional mobility. Tolerated bed mobility, transfers and gait training with mod I-supervision for safety. Encouraged walking with guard a few times per day to improve strength/mobility. Pt agreeable. Sp02 ranged from 89-90% on RA-attempting to wean to RA. Will continue to follow.    Follow Up Recommendations  No PT follow up     Equipment Recommendations  None recommended by PT    Recommendations for Other Services       Precautions / Restrictions Precautions Precautions: Cervical Precaution Booklet Issued: Yes (comment) Precaution Comments: reviewed precautions Required Braces or Orthoses: Cervical Brace Cervical Brace: Hard collar;At all times Restrictions Weight Bearing Restrictions: No    Mobility  Bed Mobility Overal bed mobility: Modified Independent Bed Mobility: Rolling;Sidelying to Sit Rolling: Modified independent (Device/Increase time) Sidelying to sit: Modified independent (Device/Increase time)       General bed mobility comments: Pt attempting to get OOB and despite reminders about the log roll technique, trying to sit straight up into long sitting.  Transfers Overall transfer level: Modified independent Equipment used: None Transfers: Sit to/from Stand Sit to Stand: Modified independent  (Device/Increase time)         General transfer comment: pt quick to stand but steady, "I just need to stretch out for a second."  Ambulation/Gait Ambulation/Gait assistance: Supervision Gait Distance (Feet): 450 Feet Assistive device: None Gait Pattern/deviations: Step-through pattern;Decreased stride length(limited by shackles) Gait velocity: decreased   General Gait Details: pt kicked locks on the shackles a few times during gait and grabbed onto therapist for Min A to maintain balance, otherwise steady. Sp02 ranged from 89-90% on RA.   Stairs             Wheelchair Mobility    Modified Rankin (Stroke Patients Only)       Balance Overall balance assessment: Mild deficits observed, not formally tested                                          Cognition Arousal/Alertness: Awake/alert Behavior During Therapy: Impulsive Overall Cognitive Status: Within Functional Limits for tasks assessed                                 General Comments: impulsive with movements overall; for basic mobility tasks but needs cues to adhere to precautions during functional mobility.      Exercises      General Comments General comments (skin integrity, edema, etc.): Pt left in bathroom with guard present.      Pertinent Vitals/Pain Pain Assessment: Faces Faces Pain Scale: Hurts even more Pain Location: neck Pain Descriptors / Indicators: Discomfort;Sore;Aching Pain Intervention(s): Repositioned;Monitored during session;Patient requesting pain meds-RN notified;Limited activity within patient's tolerance  Home Living                      Prior Function            PT Goals (current goals can now be found in the care plan section) Progress towards PT goals: Progressing toward goals    Frequency    Min 3X/week      PT Plan Current plan remains appropriate    Co-evaluation              AM-PAC PT "6 Clicks" Mobility    Outcome Measure  Help needed turning from your back to your side while in a flat bed without using bedrails?: None Help needed moving from lying on your back to sitting on the side of a flat bed without using bedrails?: None Help needed moving to and from a bed to a chair (including a wheelchair)?: None Help needed standing up from a chair using your arms (e.g., wheelchair or bedside chair)?: None Help needed to walk in hospital room?: A Little Help needed climbing 3-5 steps with a railing? : A Little 6 Click Score: 22    End of Session Equipment Utilized During Treatment: Cervical collar;Gait belt Activity Tolerance: Patient tolerated treatment well Patient left: Other (comment)(in bathroom with guard present) Nurse Communication: Mobility status PT Visit Diagnosis: Difficulty in walking, not elsewhere classified (R26.2);Pain Pain - part of body: (neck)     Time: 7654-6503 PT Time Calculation (min) (ACUTE ONLY): 15 min  Charges:  $Gait Training: 8-22 mins                     Mylo Red, PT, DPT Acute Rehabilitation Services Pager [phone] Office [phone]       Blake Divine A Lanier Ensign 12/19/2018, 3:54 PM

## 2018-12-19 NOTE — Progress Notes (Signed)
Occupational Therapy Treatment Patient Details Name: IRVINE GLORIOSO MRN: [id number] DOB: 1970/12/20 Today's Date: 12/19/2018    History of present illness 48 year old Caucasian male with past medical history significant for IV heroin and cocaine abuse, chronic back pain and prior fractures of left arm and left ankle treated operatively.  Patient was admitted with complaints of neck and back pain.  Work-up done revealed osteomyelitis of C5-C6 spine with a phlegmon.  He underwent ACDF 12/14/18.   OT comments  Pt continues to be supervision for functional mobility with no AD; pt slightly impulsive and requires cues to carryover cervical precautions functionally. Issued pt cervical precaution handout and reviewed precautions extensively; pt verbalized understanding, but stated he needed to "study them some more." Supervision for standing grooming at sink and toileting tasks. Pt on RA this session with pt de-sating to 87 during functional mobility. Encouraged pursed lip breathing with O2 rebounding with increased time. Left pt on 1 L O2 seated in recliner sating in low 90s- RN aware. Will continue to follow acutely for OT needs; DC plan remains appropriate.   Follow Up Recommendations  No OT follow up    Equipment Recommendations  None recommended by OT    Recommendations for Other Services      Precautions / Restrictions Precautions Precautions: Cervical Precaution Booklet Issued: Yes (comment) Precaution Comments: reviewed precautions; issued handout Required Braces or Orthoses: Cervical Brace Cervical Brace: Hard collar;At all times Restrictions Weight Bearing Restrictions: No       Mobility Bed Mobility Overal bed mobility: Modified Independent Bed Mobility: Rolling;Sidelying to Sit Rolling: Modified independent (Device/Increase time) Sidelying to sit: Modified independent (Device/Increase time)       General bed mobility comments: Pt able to perform bed mobility with mod I,  however required min VCs for using log rolling method  Transfers Overall transfer level: Modified independent Equipment used: None Transfers: Sit to/from Stand Sit to Stand: Supervision         General transfer comment: pt quick to stand but steady    Balance Overall balance assessment: Mild deficits observed, not formally tested                                         ADL either performed or assessed with clinical judgement   ADL Overall ADL's : Needs assistance/impaired     Grooming: Wash/dry hands;Standing;Supervision/safety                   Toilet Transfer: Ambulation;Regular Toilet;Supervision/safety;Cueing for safety           Functional mobility during ADLs: Supervision/safety General ADL Comments: Requires continued cues for safety and maintaining cervical precautions during dressing and brace management. Supervision for toileting and hand hygiene     Vision       Perception     Praxis      Cognition   Behavior During Therapy: Impulsive Overall Cognitive Status: Within Functional Limits for tasks assessed                                 General Comments: impulsive with movments overall        Exercises     Shoulder Instructions       General Comments      Pertinent Vitals/ Pain       Pain Score: 8  Pain Location: neck Pain Descriptors / Indicators: Discomfort;Sore;Aching Pain Intervention(s): Monitored during session;Repositioned  Home Living                                          Prior Functioning/Environment              Frequency  Min 2X/week        Progress Toward Goals  OT Goals(current goals can now be found in the care plan section)  Progress towards OT goals: Progressing toward goals  Acute Rehab OT Goals Patient Stated Goal: decrease pain OT Goal Formulation: With patient Time For Goal Achievement: 12/29/18 Potential to Achieve Goals: Good  Plan  Discharge plan remains appropriate    Co-evaluation                 AM-PAC OT "6 Clicks" Daily Activity     Outcome Measure   Help from another person eating meals?: None Help from another person taking care of personal grooming?: None Help from another person toileting, which includes using toliet, bedpan, or urinal?: None Help from another person bathing (including washing, rinsing, drying)?: None Help from another person to put on and taking off regular upper body clothing?: A Little Help from another person to put on and taking off regular lower body clothing?: None 6 Click Score: 23    End of Session Equipment Utilized During Treatment: Cervical collar  OT Visit Diagnosis: Pain;Other abnormalities of gait and mobility (R26.89)   Activity Tolerance Patient tolerated treatment well   Patient Left in chair;with call bell/phone within reach;with family/visitor present   Nurse Communication Mobility status;Other (comment)(de-sat on RA; left on 1 L)        Time: 0937-1000 OT Time Calculation (min): 23 min  Charges: OT General Charges $OT Visit: 1 Visit OT Treatments $Self Care/Home Management : 23-37 mins [place], [place] [facility] [phone] [place] 12/19/2018, 10:26 AM

## 2018-12-20 ENCOUNTER — Inpatient Hospital Stay (HOSPITAL_COMMUNITY)

## 2018-12-20 LAB — COMPREHENSIVE METABOLIC PANEL
ALT: 45 U/L — ABNORMAL HIGH (ref 0–44)
AST: 38 U/L (ref 15–41)
Albumin: 3.4 g/dL — ABNORMAL LOW (ref 3.5–5.0)
Alkaline Phosphatase: 141 U/L — ABNORMAL HIGH (ref 38–126)
Anion gap: 10 (ref 5–15)
BUN: 23 mg/dL — ABNORMAL HIGH (ref 6–20)
CO2: 27 mmol/L (ref 22–32)
Calcium: 9.9 mg/dL (ref 8.9–10.3)
Chloride: 100 mmol/L (ref 98–111)
Creatinine, Ser: 0.65 mg/dL (ref 0.61–1.24)
GFR calc Af Amer: >60 mL/min (ref >60)
GFR calc non Af Amer: >60 mL/min (ref >60)
Glucose, Bld: 101 mg/dL — ABNORMAL HIGH (ref 70–99)
Potassium: 4.2 mmol/L (ref 3.5–5.1)
Sodium: 137 mmol/L (ref 135–145)
Total Bilirubin: 0.3 mg/dL (ref 0.3–1.2)
Total Protein: 7.6 g/dL (ref 6.5–8.1)

## 2018-12-20 LAB — CBC WITH DIFFERENTIAL/PLATELET
Abs Immature Granulocytes: 0.28 10*3/uL — ABNORMAL HIGH (ref 0.00–0.07)
Basophils Absolute: 0.1 10*3/uL (ref 0.0–0.1)
Basophils Relative: 1 %
Eosinophils Absolute: 0.3 10*3/uL (ref 0.0–0.5)
Eosinophils Relative: 4 %
HCT: 39.7 % (ref 39.0–52.0)
Hemoglobin: 12.5 g/dL — ABNORMAL LOW (ref 13.0–17.0)
Immature Granulocytes: 3 %
Lymphocytes Relative: 21 %
Lymphs Abs: 1.8 10*3/uL (ref 0.7–4.0)
MCH: 26.2 pg (ref 26.0–34.0)
MCHC: 31.5 g/dL (ref 30.0–36.0)
MCV: 83.2 fL (ref 80.0–100.0)
Monocytes Absolute: 0.9 10*3/uL (ref 0.1–1.0)
Monocytes Relative: 10 %
Neutro Abs: 5.5 10*3/uL (ref 1.7–7.7)
Neutrophils Relative %: 61 %
Platelets: 250 10*3/uL (ref 150–400)
RBC: 4.77 MIL/uL (ref 4.22–5.81)
RDW: 20.4 % — ABNORMAL HIGH (ref 11.5–15.5)
WBC: 8.9 10*3/uL (ref 4.0–10.5)
nRBC: 0 % (ref 0.0–0.2)

## 2018-12-20 NOTE — Progress Notes (Signed)
Patient ID: David Short, male   DOB: 11-06-1970, 48 y.o.   MRN: [id number] Continues to have neck pain.  He is in a cervical collar.  His dressing is dry.  His neurologic exam is unchanged.  I reviewed his lateral C-spine x-ray done today and it looks quite good with the graft in good position and the plate in good position with no evidence of complicating features.  He is on antibiotics per infectious disease.

## 2018-12-20 NOTE — Plan of Care (Signed)

## 2018-12-20 NOTE — Progress Notes (Signed)
PROGRESS NOTE  TANK DIFIORE GDJ:[id number] DOB: 1970/10/26 DOA: 12/12/2018 PCP: Default, Provider, MD  Brief History   48 year old Caucasian male with past medical history significant for IV heroin and cocaine abuse, chronic back pain and prior fractures of left arm and left ankle treated operatively, now presented from jail with complaints of neck and back pain.Underwent CSF examination which showed some pleocytosis,  Admitted initially with tentative diagnosis of aseptic meningitis -on IV broad-spectrum IV antibiotics as well as antiviral medication.Blood cultures come back positive for Serratia. MRI brain / MRI C-spine with and without contrast was performed which showed evidence of C5-C6 discitis as well as osteomyelitis with ventral phlegmon/epidural abscess. Patient was transferred to [facility] for further assessment, management and surgery. Patient underwent decompressive anterior cervical discectomy C5-6, anterior cervical arthrodesisC5-6utilizing a cortico-cancellusbone graft and anterior cervical plating C5-6utilizing a Alphatecplateon 12/14/2018 by Neurosurgery.Infectious disease team is directing antibiotics management. Neurosurgery following along.  Consultants  . Neurosurgery . Infectious Disease  Procedures  . Decompressive anterior cervical discectomy, C5-C6.  Marland Kitchen Anterior cervical arthrodesis C5- C6 utilizing a cortico-cancellus bone graft . Anterior cervical plating C5-C6 utilizing an alphate plate.  Antibiotics   Anti-infectives (From admission, onward)   Start     Dose/Rate Route Frequency Ordered Stop   12/17/18 1045  levofloxacin (LEVAQUIN) tablet 750 mg     750 mg Oral Daily 12/17/18 1040     12/16/18 1230  cefTRIAXone (ROCEPHIN) 2 g in sodium chloride 0.9 % 100 mL IVPB  Status:  Discontinued     2 g 200 mL/hr over 30 Minutes Intravenous Every 24 hours 12/16/18 0922 12/17/18 1040   12/14/18 1028  bacitracin 50,000 Units in sodium chloride 0.9 %  500 mL irrigation  Status:  Discontinued       As needed 12/14/18 1028 12/14/18 1212   12/14/18 0941  ceFAZolin (ANCEF) 2-4 GM/100ML-% IVPB    Note to Pharmacy: Aquilla Hacker   : cabinet override      12/14/18 0941 12/14/18 2144   12/13/18 2000  ceFEPIme (MAXIPIME) 2 g in sodium chloride 0.9 % 100 mL IVPB  Status:  Discontinued     2 g 200 mL/hr over 30 Minutes Intravenous Every 8 hours 12/13/18 1054 12/13/18 1056   12/13/18 2000  ceFEPIme (MAXIPIME) 2 g in sodium chloride 0.9 % 100 mL IVPB  Status:  Discontinued     2 g 200 mL/hr over 30 Minutes Intravenous Every 8 hours 12/13/18 1056 12/16/18 0922   12/13/18 1100  ceFEPIme (MAXIPIME) 2 g in sodium chloride 0.9 % 100 mL IVPB  Status:  Discontinued     2 g 200 mL/hr over 30 Minutes Intravenous STAT 12/13/18 1054 12/13/18 1054   12/13/18 1100  ceFEPIme (MAXIPIME) 2 g in sodium chloride 0.9 % 100 mL IVPB     2 g 200 mL/hr over 30 Minutes Intravenous STAT 12/13/18 1055 12/13/18 1207   12/13/18 0600  cefTRIAXone (ROCEPHIN) 2 g in sodium chloride 0.9 % 100 mL IVPB  Status:  Discontinued     2 g 200 mL/hr over 30 Minutes Intravenous Every 12 hours 12/12/18 1715 12/13/18 1042   12/13/18 0200  vancomycin (VANCOCIN) IVPB 750 mg/150 ml premix  Status:  Discontinued     750 mg 150 mL/hr over 60 Minutes Intravenous Every 8 hours 12/12/18 1740 12/13/18 0930   12/12/18 1745  acyclovir (ZOVIRAX) 635 mg in dextrose 5 % 100 mL IVPB  Status:  Discontinued     10 mg/kg  63.5  kg 112.7 mL/hr over 60 Minutes Intravenous Every 8 hours 12/12/18 1740 12/13/18 1042   12/12/18 1715  vancomycin (VANCOCIN) IVPB 1000 mg/200 mL premix  Status:  Discontinued     1,000 mg 200 mL/hr over 60 Minutes Intravenous  Once 12/12/18 1715 12/12/18 1724   12/12/18 1500  acyclovir (ZOVIRAX) 635 mg in dextrose 5 % 100 mL IVPB     10 mg/kg  63.5 kg 112.7 mL/hr over 60 Minutes Intravenous  Once 12/12/18 1350 12/12/18 1824   12/12/18 1430  vancomycin (VANCOCIN) 1,250 mg in sodium  chloride 0.9 % 250 mL IVPB     1,250 mg 166.7 mL/hr over 90 Minutes Intravenous  Once 12/12/18 1350 12/12/18 1657   12/12/18 1400  cefTRIAXone (ROCEPHIN) 2 g in sodium chloride 0.9 % 100 mL IVPB     2 g 200 mL/hr over 30 Minutes Intravenous  Once 12/12/18 1350 12/12/18 1657     .   Subjective  The patient continues to complain of 10/10 neck pain despite increasing the dose of oxycodone.   Objective   Vitals:  Vitals:   12/20/18 0940 12/20/18 1242  BP: 116/81 114/76  Pulse: (!) 101 (!) 107  Resp: 16 18  Temp: 97.8 F (36.6 C) 98.3 F (36.8 C)  SpO2: 93% 93%   Exam:  Constitutional:  . The patient is awake, alert, and oriented x 3. The patient is complaining of severe neck pain and weakness. Respiratory:  . No increased work of breathing. . No wheezes, rales, or rhonchi . No tactile fremitus Cardiovascular:  . Regular rate and rhythm . No murmurs, ectopy, or gallups. . No lateral PMI. No thrills. Abdomen:  . Abdomen is soft, non-tender, non-distended . No hernias, masses, or organomegaly . Normoactive bowel sounds.  Musculoskeletal:  . No cyanosis, clubbing, or edema Skin:  . No rashes, lesions, ulcers . palpation of skin: no induration or nodules Neurologic:  . CN 2-12 intact . Sensation all 4 extremities intact Psychiatric:  . Mental status o Mood, affect appropriate o Orientation to person, place, time  . judgment and insight appear intact  I have personally reviewed the following:   Today's Data  . Vitals, Blood Cultures.  Scheduled Meds: . feeding supplement (ENSURE ENLIVE)  237 mL Oral BID BM  . levofloxacin  750 mg Oral Daily  . senna  1 tablet Oral BID  . sodium chloride flush  3 mL Intravenous Q12H   Continuous Infusions: . 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  Seen by infectious diseases for follow-up and recommend transition to oral antibiotics at the time of discharge: Potentially Levaquin 750 mg daily until November 22. However, NS has expressed concerns about failure of this agent for this indication in the past. EKG for QTC obtained today and okay at 435 msec. Neurosurgery following along. I appreciate their help.   Neck Pain: PT/OT evaluation appreciated, no f/u recommended on d/c. [facility] [phone][facility] who advised that patient cannot go there on opiates. The max they accept with Tylenol-3 with codeine. Patient's Oxycodone has been increased to 10mg  - 20mg  q4hr prn. The patient's  narcotics are not currently sufficient to relieve his pain. He rates it as a 10/10 today. He is also receiving toradol. I cannot discharge the patient currently as it is reported that he will not receive anything but Tylenol #3 at the prison.   Serratia bacteremia: Blood cultures and wound cultures growing Serratia.  Levaquin course as discussed above.  However, Neurosurgery has expressed concerns about failure of this agent in treating vertebral osteomyelitis in the past. The patient is continued on cefepime currently.  Polysubstance abuse:  Urine drug screen unremarkable on presentation.  Has history of cocaine/heroin abuse.Watch for signs of withdrawal.  Currently on opiates but in the setting of postoperative pain-discussed with patient regarding slow taper with nearing discharge plan. He will only be able to receive Tylenol #3 at the prison. He is currently having severe neck pain with Oxycodone 10-20 mg Q4h. This will be a slow process given the source of his pain and his habituation to narcotics.   Elevated blood pressure: In the setting of problem #1, pain and history of drug use.  Currently normotensive.  D/c IV fluids.   Continue to monitor without meds  Mild thrombocytopenia: Present on admission in the setting of C5-C6 osteomyelitis and serratia bacteremia.  Now resolved.  I have seen and examined this patient myself. I have spent 30 minutes in his evaluation and care.  DVT prophylaxis: Lovenox Code Status: Full code Family / Patient Communication: Discussed with patient. Disposition Plan: He states he has a court date on October 12 at which time he anticipates to be released from jail.  Contacted infirmary and discharge plan once pain meds de-escalated.Fran Lowes.  Heywood Tokunaga, DO [facility] Direct contact: see [email or web address]  7PM-7AM contact night coverage as above 12/20/2018, 2:16 PM  LOS: 6 days

## 2018-12-21 NOTE — Progress Notes (Signed)
PROGRESS NOTE  David Short EXN:[id number] DOB: July 07, 1970 DOA: 12/12/2018 PCP: Default, Provider, MD  Brief History   48 year old Caucasian male with past medical history significant for IV heroin and cocaine abuse, chronic back pain and prior fractures of left arm and left ankle treated operatively, now presented from jail with complaints of neck and back pain.Underwent CSF examination which showed some pleocytosis,  Admitted initially with tentative diagnosis of aseptic meningitis -on IV broad-spectrum IV antibiotics as well as antiviral medication.Blood cultures come back positive for Serratia. MRI brain / MRI C-spine with and without contrast was performed which showed evidence of C5-C6 discitis as well as osteomyelitis with ventral phlegmon/epidural abscess. Patient was transferred to [facility] for further assessment, management and surgery. Patient underwent decompressive anterior cervical discectomy C5-6, anterior cervical arthrodesisC5-6utilizing a cortico-cancellusbone graft and anterior cervical plating C5-6utilizing a Alphatecplateon 12/14/2018 by Neurosurgery.Infectious disease team is directing antibiotics management. Neurosurgery following along.  Consultants  . Neurosurgery . Infectious Disease  Procedures  . Decompressive anterior cervical discectomy, C5-C6.  Marland Kitchen Anterior cervical arthrodesis C5- C6 utilizing a cortico-cancellus bone graft . Anterior cervical plating C5-C6 utilizing an alphate plate.  Antibiotics   Anti-infectives (From admission, onward)   Start     Dose/Rate Route Frequency Ordered Stop   12/17/18 1045  levofloxacin (LEVAQUIN) tablet 750 mg     750 mg Oral Daily 12/17/18 1040     12/16/18 1230  cefTRIAXone (ROCEPHIN) 2 g in sodium chloride 0.9 % 100 mL IVPB  Status:  Discontinued     2 g 200 mL/hr over 30 Minutes Intravenous Every 24 hours 12/16/18 0922 12/17/18 1040   12/14/18 1028  bacitracin 50,000 Units in sodium chloride 0.9 %  500 mL irrigation  Status:  Discontinued       As needed 12/14/18 1028 12/14/18 1212   12/14/18 0941  ceFAZolin (ANCEF) 2-4 GM/100ML-% IVPB    Note to Pharmacy: Aquilla Hacker   : cabinet override      12/14/18 0941 12/14/18 2144   12/13/18 2000  ceFEPIme (MAXIPIME) 2 g in sodium chloride 0.9 % 100 mL IVPB  Status:  Discontinued     2 g 200 mL/hr over 30 Minutes Intravenous Every 8 hours 12/13/18 1054 12/13/18 1056   12/13/18 2000  ceFEPIme (MAXIPIME) 2 g in sodium chloride 0.9 % 100 mL IVPB  Status:  Discontinued     2 g 200 mL/hr over 30 Minutes Intravenous Every 8 hours 12/13/18 1056 12/16/18 0922   12/13/18 1100  ceFEPIme (MAXIPIME) 2 g in sodium chloride 0.9 % 100 mL IVPB  Status:  Discontinued     2 g 200 mL/hr over 30 Minutes Intravenous STAT 12/13/18 1054 12/13/18 1054   12/13/18 1100  ceFEPIme (MAXIPIME) 2 g in sodium chloride 0.9 % 100 mL IVPB     2 g 200 mL/hr over 30 Minutes Intravenous STAT 12/13/18 1055 12/13/18 1207   12/13/18 0600  cefTRIAXone (ROCEPHIN) 2 g in sodium chloride 0.9 % 100 mL IVPB  Status:  Discontinued     2 g 200 mL/hr over 30 Minutes Intravenous Every 12 hours 12/12/18 1715 12/13/18 1042   12/13/18 0200  vancomycin (VANCOCIN) IVPB 750 mg/150 ml premix  Status:  Discontinued     750 mg 150 mL/hr over 60 Minutes Intravenous Every 8 hours 12/12/18 1740 12/13/18 0930   12/12/18 1745  acyclovir (ZOVIRAX) 635 mg in dextrose 5 % 100 mL IVPB  Status:  Discontinued     10 mg/kg  63.5  kg 112.7 mL/hr over 60 Minutes Intravenous Every 8 hours 12/12/18 1740 12/13/18 1042   12/12/18 1715  vancomycin (VANCOCIN) IVPB 1000 mg/200 mL premix  Status:  Discontinued     1,000 mg 200 mL/hr over 60 Minutes Intravenous  Once 12/12/18 1715 12/12/18 1724   12/12/18 1500  acyclovir (ZOVIRAX) 635 mg in dextrose 5 % 100 mL IVPB     10 mg/kg  63.5 kg 112.7 mL/hr over 60 Minutes Intravenous  Once 12/12/18 1350 12/12/18 1824   12/12/18 1430  vancomycin (VANCOCIN) 1,250 mg in sodium  chloride 0.9 % 250 mL IVPB     1,250 mg 166.7 mL/hr over 90 Minutes Intravenous  Once 12/12/18 1350 12/12/18 1657   12/12/18 1400  cefTRIAXone (ROCEPHIN) 2 g in sodium chloride 0.9 % 100 mL IVPB     2 g 200 mL/hr over 30 Minutes Intravenous  Once 12/12/18 1350 12/12/18 1657     .   Subjective  The patient continues to complain of unrelenting neck pain.  Objective   Vitals:  Vitals:   12/21/18 0832 12/21/18 1209  BP: 120/82 107/81  Pulse: (!) 103 (!) 104  Resp:  20  Temp: 98.2 F (36.8 C) 97.9 F (36.6 C)  SpO2: 90%    Exam:  Constitutional:  . The patient is awake, alert, and oriented x 3. The patient is complaining of severe neck pain. Respiratory:  . No increased work of breathing. . No wheezes, rales, or rhonchi . No tactile fremitus Cardiovascular:  . Regular rate and rhythm . No murmurs, ectopy, or gallups. . No lateral PMI. No thrills. Abdomen:  . Abdomen is soft, non-tender, non-distended . No hernias, masses, or organomegaly . Normoactive bowel sounds.  Musculoskeletal:  . No cyanosis, clubbing, or edema Skin:  . No rashes, lesions, ulcers . palpation of skin: no induration or nodules Neurologic:  . CN 2-12 intact . Sensation all 4 extremities intact Psychiatric:  . Mental status o Mood, affect appropriate o Orientation to person, place, time  . judgment and insight appear intact  I have personally reviewed the following:   Today's Data  . Vitals, Blood Cultures.  Scheduled Meds: . feeding supplement (ENSURE ENLIVE)  237 mL Oral BID BM  . levofloxacin  750 mg Oral Daily  . senna  1 tablet Oral BID  . sodium chloride flush  3 mL Intravenous Q12H   Continuous Infusions: . sodium chloride      Principal Problem:   Aseptic meningitis Active Problems:   Accelerated hypertension   Sinus tachycardia   mild Thrombocytopenia (HCC)   S/P cervical spinal fusion   Neck pain   Malnutrition of moderate degree   LOS: 9 days   A & P   C5-C6  vertebral osteomyelitis in the setting of IV drug abuse: Currently on IV cefepime.  Seen by infectious diseases for follow-up and recommend transition to oral antibiotics at the time of discharge: Potentially Levaquin 750 mg daily until November 22. However, NS has expressed concerns about failure of this agent for this indication in the past. EKG for QTC obtained today and okay at 435 msec. Neurosurgery following along. I appreciate their help.   Neck Pain: PT/OT evaluation appreciated, no f/u recommended on d/c. [facility] at prison [phone]) who advised that patient cannot go there on opiates. The max they accept with Tylenol-3 with codeine. Patient's Oxycodone has been increased to 10mg  - 20mg  q4hr prn. The patient's narcotics are not currently sufficient to relieve his pain.  He rates it as a 10/10 today. He is also receiving toradol. I cannot discharge the patient currently as it is reported that he will not receive anything but Tylenol #3 at the prison.   Serratia bacteremia: Blood cultures and wound cultures growing Serratia.  Levaquin course as discussed above.  However, Neurosurgery has expressed concerns about failure of this agent in treating vertebral osteomyelitis in the past. The patient is continued on cefepime currently.  Polysubstance abuse:  Urine drug screen unremarkable on presentation.  Has history of cocaine/heroin abuse.Watch for signs of withdrawal.  Currently on opiates but in the setting of postoperative pain-discussed with patient regarding slow taper with nearing discharge plan. He will only be able to receive Tylenol #3 at the prison. He is currently having severe neck pain with Oxycodone 10-20 mg Q4h. This will be a slow process given the source of his pain and his habituation to narcotics.   Elevated blood pressure: In the setting of problem #1, pain and history of drug use.  Currently normotensive.  D/c IV fluids.  Continue to monitor without meds  Mild  thrombocytopenia: Present on admission in the setting of C5-C6 osteomyelitis and serratia bacteremia.  Now resolved.  I have seen and examined this patient myself. I have spent 32 minutes in his evaluation and care.  DVT prophylaxis: Lovenox Code Status: Full code Family / Patient Communication: Discussed with patient. Disposition Plan: He states he has a court date on October 12 at which time he anticipates to be released from jail.  Contacted infirmary and discharge plan once pain meds de-escalated.Fran Lowes.  Leeloo Silverthorne, DO [facility] Direct contact: see [email or web address]  7PM-7AM contact night coverage as above 12/21/2018, 16:04 PM  LOS: 6 days

## 2018-12-21 NOTE — Progress Notes (Signed)
Patient ID: David Short, male   DOB: 30-Jun-1970, 48 y.o.   MRN: [id number] BP 120/82   Pulse (!) 103   Temp 98.2 F (36.8 C) (Oral)   Resp 20   Ht 5\' 10"  (1.778 m)   Wt 71.3 kg   SpO2 90%   BMI 22.55 kg/m  Alert and oriented x 4 Wound is clean, dry Moving all extremities well

## 2018-12-22 MED ORDER — SODIUM CHLORIDE 0.9 % IV SOLN
2.0000 g | Freq: Three times a day (TID) | INTRAVENOUS | Status: DC
  Administered 2018-12-22 – 2018-12-30 (×23): 2 g via INTRAVENOUS
  Filled 2018-12-22 (×31): qty 2

## 2018-12-22 MED ORDER — SODIUM CHLORIDE 0.9 % IV SOLN: 1.0000 g | Freq: Three times a day (TID) | INTRAVENOUS | Status: DC

## 2018-12-22 NOTE — Progress Notes (Signed)
PROGRESS NOTE  David Short UXL:[id number] DOB: Mar 08, 1971 DOA: 12/12/2018 PCP: Default, Provider, MD  Brief History   48 year old Caucasian male with past medical history significant for IV heroin and cocaine abuse, chronic back pain and prior fractures of left arm and left ankle treated operatively, now presented from jail with complaints of neck and back pain.Underwent CSF examination which showed some pleocytosis,  Admitted initially with tentative diagnosis of aseptic meningitis -on IV broad-spectrum IV antibiotics as well as antiviral medication.Blood cultures come back positive for Serratia. MRI brain / MRI C-spine with and without contrast was performed which showed evidence of C5-C6 discitis as well as osteomyelitis with ventral phlegmon/epidural abscess. Patient was transferred to [facility] for further assessment, management and surgery. Patient underwent decompressive anterior cervical discectomy C5-6, anterior cervical arthrodesisC5-6utilizing a cortico-cancellusbone graft and anterior cervical plating C5-6utilizing a Alphatecplateon 12/14/2018 by Neurosurgery.Infectious disease team is directing antibiotics management. Neurosurgery following along.  Consultants  . Neurosurgery . Infectious Disease  Procedures  . Decompressive anterior cervical discectomy, C5-C6.  Marland Kitchen Anterior cervical arthrodesis C5- C6 utilizing a cortico-cancellus bone graft . Anterior cervical plating C5-C6 utilizing an alphate plate.  Antibiotics   Anti-infectives (From admission, onward)   Start     Dose/Rate Route Frequency Ordered Stop   12/17/18 1045  levofloxacin (LEVAQUIN) tablet 750 mg     750 mg Oral Daily 12/17/18 1040     12/16/18 1230  cefTRIAXone (ROCEPHIN) 2 g in sodium chloride 0.9 % 100 mL IVPB  Status:  Discontinued     2 g 200 mL/hr over 30 Minutes Intravenous Every 24 hours 12/16/18 0922 12/17/18 1040   12/14/18 1028  bacitracin 50,000 Units in sodium chloride 0.9 %  500 mL irrigation  Status:  Discontinued       As needed 12/14/18 1028 12/14/18 1212   12/14/18 0941  ceFAZolin (ANCEF) 2-4 GM/100ML-% IVPB    Note to Pharmacy: Aquilla Hacker   : cabinet override      12/14/18 0941 12/14/18 2144   12/13/18 2000  ceFEPIme (MAXIPIME) 2 g in sodium chloride 0.9 % 100 mL IVPB  Status:  Discontinued     2 g 200 mL/hr over 30 Minutes Intravenous Every 8 hours 12/13/18 1054 12/13/18 1056   12/13/18 2000  ceFEPIme (MAXIPIME) 2 g in sodium chloride 0.9 % 100 mL IVPB  Status:  Discontinued     2 g 200 mL/hr over 30 Minutes Intravenous Every 8 hours 12/13/18 1056 12/16/18 0922   12/13/18 1100  ceFEPIme (MAXIPIME) 2 g in sodium chloride 0.9 % 100 mL IVPB  Status:  Discontinued     2 g 200 mL/hr over 30 Minutes Intravenous STAT 12/13/18 1054 12/13/18 1054   12/13/18 1100  ceFEPIme (MAXIPIME) 2 g in sodium chloride 0.9 % 100 mL IVPB     2 g 200 mL/hr over 30 Minutes Intravenous STAT 12/13/18 1055 12/13/18 1207   12/13/18 0600  cefTRIAXone (ROCEPHIN) 2 g in sodium chloride 0.9 % 100 mL IVPB  Status:  Discontinued     2 g 200 mL/hr over 30 Minutes Intravenous Every 12 hours 12/12/18 1715 12/13/18 1042   12/13/18 0200  vancomycin (VANCOCIN) IVPB 750 mg/150 ml premix  Status:  Discontinued     750 mg 150 mL/hr over 60 Minutes Intravenous Every 8 hours 12/12/18 1740 12/13/18 0930   12/12/18 1745  acyclovir (ZOVIRAX) 635 mg in dextrose 5 % 100 mL IVPB  Status:  Discontinued     10 mg/kg  63.5  kg 112.7 mL/hr over 60 Minutes Intravenous Every 8 hours 12/12/18 1740 12/13/18 1042   12/12/18 1715  vancomycin (VANCOCIN) IVPB 1000 mg/200 mL premix  Status:  Discontinued     1,000 mg 200 mL/hr over 60 Minutes Intravenous  Once 12/12/18 1715 12/12/18 1724   12/12/18 1500  acyclovir (ZOVIRAX) 635 mg in dextrose 5 % 100 mL IVPB     10 mg/kg  63.5 kg 112.7 mL/hr over 60 Minutes Intravenous  Once 12/12/18 1350 12/12/18 1824   12/12/18 1430  vancomycin (VANCOCIN) 1,250 mg in sodium  chloride 0.9 % 250 mL IVPB     1,250 mg 166.7 mL/hr over 90 Minutes Intravenous  Once 12/12/18 1350 12/12/18 1657   12/12/18 1400  cefTRIAXone (ROCEPHIN) 2 g in sodium chloride 0.9 % 100 mL IVPB     2 g 200 mL/hr over 30 Minutes Intravenous  Once 12/12/18 1350 12/12/18 1657     .   Subjective  The patient continues to complain of unrelenting neck pain.  Objective   Vitals:  Vitals:   12/22/18 0409 12/22/18 0752  BP: 115/78 126/88  Pulse: (!) 109 (!) 112  Resp: 19 20  Temp: 97.8 F (36.6 C) 97.8 F (36.6 C)  SpO2: 92% (!) 89%   Exam:  Constitutional:  . The patient is awake, alert, and oriented x 3. The patient is complaining of severe neck pain. Respiratory:  . No increased work of breathing. . No wheezes, rales, or rhonchi . No tactile fremitus Cardiovascular:  . Regular rate and rhythm . No murmurs, ectopy, or gallups. . No lateral PMI. No thrills. Abdomen:  . Abdomen is soft, non-tender, non-distended . No hernias, masses, or organomegaly . Normoactive bowel sounds.  Musculoskeletal:  . No cyanosis, clubbing, or edema Skin:  . No rashes, lesions, ulcers . palpation of skin: no induration or nodules Neurologic:  . CN 2-12 intact . Sensation all 4 extremities intact Psychiatric:  . Mental status o Mood, affect appropriate o Orientation to person, place, time  . judgment and insight appear intact  I have personally reviewed the following:   Today's Data  . Vitals, Blood Cultures.  Scheduled Meds: . feeding supplement (ENSURE ENLIVE)  237 mL Oral BID BM  . levofloxacin  750 mg Oral Daily  . senna  1 tablet Oral BID  . sodium chloride flush  3 mL Intravenous Q12H   Continuous Infusions: . 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  Seen by infectious diseases for follow-up and recommend transition to oral antibiotics at the time of discharge: Potentially Levaquin 750 mg daily until November 22. However, NS has expressed concerns about failure of this agent for this indication in the past. EKG for QTC obtained today and okay at 435 msec. Neurosurgery following along. I appreciate their help.   Neck Pain: PT/OT evaluation appreciated, no f/u recommended on d/c. [facility] at prison [phone]) who advised that patient cannot go there on opiates. The max they accept with Tylenol-3 with codeine. Patient's Oxycodone has been increased to 10mg  - 20mg  q4hr prn. The patient's narcotics are not currently sufficient to relieve his  pain. He rates it as a 10/10 today. He is also receiving toradol. I cannot discharge the patient currently as it is reported that he will not receive anything but Tylenol #3 at the prison.   Serratia bacteremia: Blood cultures and wound cultures growing Serratia.  Levaquin course as discussed above.  However, Neurosurgery has expressed concerns about failure of this agent in treating vertebral osteomyelitis in the past. The patient is continued on cefepime currently.  Polysubstance abuse:  Urine drug screen unremarkable on presentation.  Has history of cocaine/heroin abuse.Watch for signs of withdrawal.  Currently on opiates but in the setting of postoperative pain-discussed with patient regarding slow taper with nearing discharge plan. He will only be able to receive Tylenol #3 at the prison. He is currently having severe neck pain with Oxycodone 10-20 mg Q4h. This will be a slow process given the source of his pain and his habituation to narcotics.   Elevated blood pressure: In the setting of problem #1, pain and history of drug use.  Currently normotensive.  D/c IV fluids.  Continue to monitor without meds  Mild  thrombocytopenia: Present on admission in the setting of C5-C6 osteomyelitis and serratia bacteremia.  Now resolved.  I have seen and examined this patient myself. I have spent 30 minutes in his evaluation and care.  DVT prophylaxis: Lovenox Code Status: Full code Family / Patient Communication: Discussed with patient. Disposition Plan: He states he has a court date on October 12 at which time he anticipates to be released from jail.  Contacted infirm[facility] and discharge plan once pain meds de-escalated.Karie Kirks, DO [facility] Direct contact: see [email or web address]  7PM-7AM contact night coverage as above 12/22/2018, 2:42 PM  LOS: 6 days

## 2018-12-22 NOTE — Progress Notes (Signed)
Physical Therapy Treatment Patient Details Name: David Short MRN: [id number] DOB: [birth year] Today's Date: 12/22/2018    History of Present Illness 48 year old Caucasian male with past medical history significant for IV heroin and cocaine abuse, chronic back pain and prior fractures of left arm and left ankle treated operatively.  Patient was admitted with complaints of neck and back pain.  Work-up done revealed osteomyelitis of C5-C6 spine with a phlegmon.  He underwent ACDF 12/14/18.    PT Comments    Patient remains slightly off-balance during gait (especially when given a cognitive challenge while walking) and with higher level balance activities. Do not feel the shackles are contributing to imbalance.    Follow Up Recommendations  No PT follow up     Equipment Recommendations  None recommended by PT    Recommendations for Other Services       Precautions / Restrictions Precautions Precautions: Cervical Precaution Booklet Issued: Yes (comment) Precaution Comments: reviewed precautions Required Braces or Orthoses: Cervical Brace Cervical Brace: Hard collar;At all times Restrictions Weight Bearing Restrictions: No    Mobility  Bed Mobility Overal bed mobility: Modified Independent Bed Mobility: Rolling;Sidelying to Sit;Sit to Supine     Supine to sit: Modified independent (Device/Increase time);HOB elevated Sit to supine: Modified independent (Device/Increase time);HOB elevated      Transfers Overall transfer level: Modified independent Equipment used: None Transfers: Sit to/from Stand Sit to Stand: Modified independent (Device/Increase time)            Ambulation/Gait Ambulation/Gait assistance: Min guard Gait Distance (Feet): 200 Feet Assistive device: None Gait Pattern/deviations: Step-through pattern;Decreased stride length;Wide base of support;Antalgic(limited by shackles)   Gait velocity interpretation: 1.31 - 2.62 ft/sec, indicative of limited  community ambulator General Gait Details: no imbalance throughout; varied speeds up and down; side stepping lt/rt   Chief Strategy Officer    Modified Rankin (Stroke Patients Only)       Balance Overall balance assessment: Needs assistance                       Rhomberg - Eyes Opened: 30 Rhomberg - Eyes Closed: 30(incr sway, caught himself) High level balance activites: Side stepping;Turns;Sudden stops High Level Balance Comments: also had pt stating food items beginning with A to Z while walking with pt tending to drift more with slight unsteadiness            Cognition Arousal/Alertness: Lethargic;Suspect due to medications(initially, improved with activity) Behavior During Therapy: Flat affect Overall Cognitive Status: Within Functional Limits for tasks assessed                                 General Comments: adhering to precautions during balance and gait      Exercises      General Comments General comments (skin integrity, edema, etc.): Guard in attendance throughout session      Pertinent Vitals/Pain Pain Assessment: 0-10 Pain Score: 6  Pain Location: left ankle/heel and neck Pain Descriptors / Indicators: Discomfort;Sore;Aching;Grimacing;Guarding Pain Intervention(s): Limited activity within patient's tolerance;Premedicated before session;Monitored during session    Home Living                      Prior Function            PT Goals (current goals can now be found in the care  plan section) Acute Rehab PT Goals Patient Stated Goal: decrease pain Time For Goal Achievement: 12/29/18 Potential to Achieve Goals: Good Progress towards PT goals: Progressing toward goals    Frequency    Min 3X/week      PT Plan Current plan remains appropriate    Co-evaluation              AM-PAC PT "6 Clicks" Mobility   Outcome Measure  Help needed turning from your back to your side while in a  flat bed without using bedrails?: None Help needed moving from lying on your back to sitting on the side of a flat bed without using bedrails?: None Help needed moving to and from a bed to a chair (including a wheelchair)?: None Help needed standing up from a chair using your arms (e.g., wheelchair or bedside chair)?: None Help needed to walk in hospital room?: A Little Help needed climbing 3-5 steps with a railing? : A Little 6 Click Score: 22    End of Session Equipment Utilized During Treatment: Cervical collar;Gait belt Activity Tolerance: Patient tolerated treatment well Patient left: Other (comment)(in bathroom with guard present)   PT Visit Diagnosis: Difficulty in walking, not elsewhere classified (R26.2);Pain Pain - Right/Left: Left Pain - part of body: Ankle and joints of foot(neck)     Time: 6720-9470 PT Time Calculation (min) (ACUTE ONLY): 20 min  Charges:  $Gait Training: 8-22 mins                       Veda Canning, PT       [place] [name] 12/22/2018, 2:28 PM

## 2018-12-23 NOTE — Progress Notes (Signed)
Occupational Therapy Treatment Patient Details Name: David Short MRN: [id number] DOB: Sep 26, 1970 Today's Date: 12/23/2018    History of present illness 48 year old Caucasian male with past medical history significant for IV heroin and cocaine abuse, chronic back pain and prior fractures of left arm and left ankle treated operatively.  Patient was admitted with complaints of neck and back pain.  Work-up done revealed osteomyelitis of C5-C6 spine with a phlegmon.  He underwent ACDF 12/14/18.   OT comments   Pt initial reports he "can't remember all that" when asked to recall cervical precautions. Reviewed precautions with pt and provided functional examples. Pt able to state precautions by end of session. Pt able to log roll MOD I after cueing pt to demonstrate technique. Overall, pt impulsive with mobility standing immediately once EOB and disconnecting SaO2 monitor. Supervision for functional mobility with no AD from EOB>bathroom. Attempted functional mobility on RA with pt de-sating to 82%; reapplied SaO2 with sats rebounding to 93. Pt complete standing grooming/ toileting at  Supervision level. Pt requires cues throughout to maintain precautions as pt noted twisting neck and removing collar at start of session. DC plan remains appropriate; will continue to follow acutely for OT needs.    Follow Up Recommendations  No OT follow up    Equipment Recommendations  None recommended by OT    Recommendations for Other Services      Precautions / Restrictions Precautions Precautions: Cervical Precaution Booklet Issued: Yes (comment) Precaution Comments: reviewed precautions Required Braces or Orthoses: Cervical Brace Cervical Brace: Hard collar;At all times Restrictions Weight Bearing Restrictions: No       Mobility Bed Mobility Overal bed mobility: Modified Independent Bed Mobility: Rolling;Sidelying to Sit Rolling: Modified independent (Device/Increase time) Sidelying to sit:  Modified independent (Device/Increase time)       General bed mobility comments: pt able to log roll MOD I once cued to demonstrate technique  Transfers Overall transfer level: Modified independent Equipment used: None Transfers: Sit to/from Stand Sit to Stand: Modified independent (Device/Increase time)         General transfer comment: pt quick to stand    Balance Overall balance assessment: Needs assistance Sitting-balance support: Feet supported Sitting balance-Leahy Scale: Fair     Standing balance support: During functional activity Standing balance-Leahy Scale: Fair Standing balance comment: able to static stand at sink for grooming                           ADL either performed or assessed with clinical judgement   ADL       Grooming: Wash/dry hands;Wash/dry face;Sitting;Set up;Supervision/safety Grooming Details (indicate cue type and reason): supervision for safety seated as pt slighlty impulsive Upper Body Bathing: Set up;Sitting       Upper Body Dressing : Supervision/safety;Sitting Upper Body Dressing Details (indicate cue type and reason): pt found adjusting brace with HOB 30 degrees; education on donning brace in supine iwth HOB 0 degrees     Toilet Transfer: Ambulation;Regular Toilet;Supervision/safety;Cueing for safety   Toileting- Clothing Manipulation and Hygiene: Sit to/from stand;Supervision/safety       Functional mobility during ADLs: Supervision/safety General ADL Comments: Requires continued cues for safety and maintaining cervical precautions during ADLs and brace management. Supervision for toileting and hand hygiene; pt slightly impulsive this session unhooking o2 monitor and various lines     Vision       Perception     Praxis      Cognition Arousal/Alertness: Awake/alert  Behavior During Therapy: Impulsive Overall Cognitive Status: Impaired/Different from baseline Area of Impairment: Problem  solving;Safety/judgement                         Safety/Judgement: Decreased awareness of safety   Problem Solving: Requires verbal cues General Comments: pleasant but impulsive and required cues for problem solving as pt impulsive and benefit from cues to maintain cervical precautions; poor safety awareness        Exercises     Shoulder Instructions       General Comments guard in attendance throuhtout session; attempted funcitonal mobility on RA with O2 sats dropping to 82%; rebounded once O2 reapplied at 2 L    Pertinent Vitals/ Pain       Pain Assessment: Faces Faces Pain Scale: Hurts a little bit Pain Location: neck Pain Descriptors / Indicators: Discomfort;Sore;Aching;Grimacing;Guarding Pain Intervention(s): Monitored during session;Repositioned  Home Living                                          Prior Functioning/Environment              Frequency  Min 2X/week        Progress Toward Goals  OT Goals(current goals can now be found in the care plan section)  Progress towards OT goals: Progressing toward goals  Acute Rehab OT Goals Patient Stated Goal: decrease pain OT Goal Formulation: With patient Time For Goal Achievement: 12/29/18 Potential to Achieve Goals: Good  Plan Discharge plan remains appropriate    Co-evaluation                 AM-PAC OT "6 Clicks" Daily Activity     Outcome Measure   Help from another person eating meals?: None Help from another person taking care of personal grooming?: None Help from another person toileting, which includes using toliet, bedpan, or urinal?: None Help from another person bathing (including washing, rinsing, drying)?: None Help from another person to put on and taking off regular upper body clothing?: A Little Help from another person to put on and taking off regular lower body clothing?: None 6 Click Score: 23    End of Session Equipment Utilized During Treatment:  Cervical collar  OT Visit Diagnosis: Pain;Other abnormalities of gait and mobility (R26.89)   Activity Tolerance Patient tolerated treatment well   Patient Left in chair;with call bell/phone within reach;with family/visitor present   Nurse Communication          Time: 6659-9357 OT Time Calculation (min): 24 min  Charges: OT General Charges $OT Visit: 1 Visit OT Treatments $Self Care/Home Management : 23-37 mins  Pol[place], COTA/L Acute Rehabilitation Services [phone]   Angelina Pih 12/23/2018, 11:22 AM

## 2018-12-23 NOTE — Progress Notes (Signed)
Patient ID: David Short, male   DOB: November 17, 1970, 48 y.o.   MRN: [id number] Pt in collar,. No change neuro, pain control still an issue, but not sure how much of that is addictive behavior and the knowledge that he can't go back to jail as long as he needs higher doses of pain meds?

## 2018-12-23 NOTE — Progress Notes (Signed)
PROGRESS NOTE  David Short GUR:[id number] DOB: 1970/09/19 DOA: 12/12/2018 PCP: Default, Provider, MD  Brief History   48 year old Caucasian male with past medical history significant for IV heroin and cocaine abuse, chronic back pain and prior fractures of left arm and left ankle treated operatively, now presented from jail with complaints of neck and back pain.Underwent CSF examination which showed some pleocytosis,  Admitted initially with tentative diagnosis of aseptic meningitis -on IV broad-spectrum IV antibiotics as well as antiviral medication.Blood cultures come back positive for Serratia. MRI brain / MRI C-spine with and without contrast was performed which showed evidence of C5-C6 discitis as well as osteomyelitis with ventral phlegmon/epidural abscess. Patient was transferred to [facility] for further assessment, management and surgery. Patient underwent decompressive anterior cervical discectomy C5-6, anterior cervical arthrodesisC5-6utilizing a cortico-cancellusbone graft and anterior cervical plating C5-6utilizing a Alphatecplateon 12/14/2018 by Neurosurgery.Infectious disease team is directing antibiotics management. Neurosurgery following along.  Consultants  . Neurosurgery . Infectious Disease  Procedures  . Decompressive anterior cervical discectomy, C5-C6.  Marland Kitchen Anterior cervical arthrodesis C5- C6 utilizing a cortico-cancellus bone graft . Anterior cervical plating C5-C6 utilizing an alphate plate.  Antibiotics   Anti-infectives (From admission, onward)   Start     Dose/Rate Route Frequency Ordered Stop   12/22/18 1445  ceFEPIme (MAXIPIME) 1 g in sodium chloride 0.9 % 100 mL IVPB  Status:  Discontinued     1 g 200 mL/hr over 30 Minutes Intravenous Every 8 hours 12/22/18 1439 12/22/18 1441   12/22/18 1445  ceFEPIme (MAXIPIME) 2 g in sodium chloride 0.9 % 100 mL IVPB     2 g 200 mL/hr over 30 Minutes Intravenous Every 8 hours 12/22/18 1441     12/17/18  1045  levofloxacin (LEVAQUIN) tablet 750 mg  Status:  Discontinued     750 mg Oral Daily 12/17/18 1040 12/22/18 1439   12/16/18 1230  cefTRIAXone (ROCEPHIN) 2 g in sodium chloride 0.9 % 100 mL IVPB  Status:  Discontinued     2 g 200 mL/hr over 30 Minutes Intravenous Every 24 hours 12/16/18 0922 12/17/18 1040   12/14/18 1028  bacitracin 50,000 Units in sodium chloride 0.9 % 500 mL irrigation  Status:  Discontinued       As needed 12/14/18 1028 12/14/18 1212   12/14/18 0941  ceFAZolin (ANCEF) 2-4 GM/100ML-% IVPB    Note to Pharmacy: Aquilla Hacker   : cabinet override      12/14/18 0941 12/14/18 2144   12/13/18 2000  ceFEPIme (MAXIPIME) 2 g in sodium chloride 0.9 % 100 mL IVPB  Status:  Discontinued     2 g 200 mL/hr over 30 Minutes Intravenous Every 8 hours 12/13/18 1054 12/13/18 1056   12/13/18 2000  ceFEPIme (MAXIPIME) 2 g in sodium chloride 0.9 % 100 mL IVPB  Status:  Discontinued     2 g 200 mL/hr over 30 Minutes Intravenous Every 8 hours 12/13/18 1056 12/16/18 0922   12/13/18 1100  ceFEPIme (MAXIPIME) 2 g in sodium chloride 0.9 % 100 mL IVPB  Status:  Discontinued     2 g 200 mL/hr over 30 Minutes Intravenous STAT 12/13/18 1054 12/13/18 1054   12/13/18 1100  ceFEPIme (MAXIPIME) 2 g in sodium chloride 0.9 % 100 mL IVPB     2 g 200 mL/hr over 30 Minutes Intravenous STAT 12/13/18 1055 12/13/18 1207   12/13/18 0600  cefTRIAXone (ROCEPHIN) 2 g in sodium chloride 0.9 % 100 mL IVPB  Status:  Discontinued  2 g 200 mL/hr over 30 Minutes Intravenous Every 12 hours 12/12/18 1715 12/13/18 1042   12/13/18 0200  vancomycin (VANCOCIN) IVPB 750 mg/150 ml premix  Status:  Discontinued     750 mg 150 mL/hr over 60 Minutes Intravenous Every 8 hours 12/12/18 1740 12/13/18 0930   12/12/18 1745  acyclovir (ZOVIRAX) 635 mg in dextrose 5 % 100 mL IVPB  Status:  Discontinued     10 mg/kg  63.5 kg 112.7 mL/hr over 60 Minutes Intravenous Every 8 hours 12/12/18 1740 12/13/18 1042   12/12/18 1715   vancomycin (VANCOCIN) IVPB 1000 mg/200 mL premix  Status:  Discontinued     1,000 mg 200 mL/hr over 60 Minutes Intravenous  Once 12/12/18 1715 12/12/18 1724   12/12/18 1500  acyclovir (ZOVIRAX) 635 mg in dextrose 5 % 100 mL IVPB     10 mg/kg  63.5 kg 112.7 mL/hr over 60 Minutes Intravenous  Once 12/12/18 1350 12/12/18 1824   12/12/18 1430  vancomycin (VANCOCIN) 1,250 mg in sodium chloride 0.9 % 250 mL IVPB     1,250 mg 166.7 mL/hr over 90 Minutes Intravenous  Once 12/12/18 1350 12/12/18 1657   12/12/18 1400  cefTRIAXone (ROCEPHIN) 2 g in sodium chloride 0.9 % 100 mL IVPB     2 g 200 mL/hr over 30 Minutes Intravenous  Once 12/12/18 1350 12/12/18 1657     .   Subjective  The patient continues to complain of unrelenting neck pain.  Objective   Vitals:  Vitals:   12/23/18 1112 12/23/18 1113  BP:  122/81  Pulse:  95  Resp:  20  Temp:  (!) 97.1 F (36.2 C)  SpO2: 95% (!) 86%   Exam:  Constitutional:  . The patient is awake, alert, and oriented x 3. The patient is continues to complain of neck pain, but he states that he feels better on cefepime.  Respiratory:  . No increased work of breathing. . No wheezes, rales, or rhonchi . No tactile fremitus Cardiovascular:  . Regular rate and rhythm . No murmurs, ectopy, or gallups. . No lateral PMI. No thrills. Abdomen:  . Abdomen is soft, non-tender, non-distended . No hernias, masses, or organomegaly . Normoactive bowel sounds.  Musculoskeletal:  . No cyanosis, clubbing, or edema Skin:  . No rashes, lesions, ulcers . palpation of skin: no induration or nodules Neurologic:  . CN 2-12 intact . Sensation all 4 extremities intact Psychiatric:  . Mental status o Mood, affect appropriate o Orientation to person, place, time  . judgment and insight appear intact  I have personally reviewed the following:   Today's Data  . Vitals, Blood Cultures.  Scheduled Meds: . feeding supplement (ENSURE ENLIVE)  237 mL Oral BID BM   . senna  1 tablet Oral BID  . sodium chloride flush  3 mL Intravenous Q12H   Continuous Infusions: . sodium chloride    . 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  Seen by infectious diseases for follow-up and recommend transition to oral antibiotics at the time of discharge: Potentially Levaquin 750 mg daily until November 22. However, NS has  expressed concerns about failure of this agent for this indication in the past. EKG for QTC obtained today and okay at 435 msec. Neurosurgery following along. I appreciate their help.   Neck Pain: PT/OT evaluation appreciated, no f/u recommended on d/c. [facility] prison [phone]) who advised that patient cannot go there on opiates. The max they accept with Tylenol-3 with codeine. Patient's Oxycodone has been increased to 10mg  - 20mg  q4hr prn. The patient's narcotics are not currently sufficient to relieve his pain. He rates it as a 10/10 today. He is also receiving toradol. I cannot discharge the patient currently as it is reported that he will not receive anything but Tylenol #3 at the prison.   Serratia bacteremia: Blood cultures and wound cultures growing Serratia.  Levaquin course as discussed above.  However, Neurosurgery has expressed concerns about failure of this agent in treating vertebral osteomyelitis in the past. The patient is continued on cefepime currently.  Polysubstance abuse:  Urine drug screen unremarkable on presentation.  Has history of cocaine/heroin abuse.Watch for signs of withdrawal.  Currently on opiates but in the setting of postoperative pain-discussed with patient regarding slow taper with  nearing discharge plan. He will only be able to receive Tylenol #3 at the prison. He is currently having severe neck pain with Oxycodone 10-20 mg Q4h. This will be a slow process given the source of his pain and his habituation to narcotics.   Elevated blood pressure: In the setting of problem #1, pain and history of drug use.  Currently normotensive.  D/c IV fluids.  Continue to monitor without meds  Mild thrombocytopenia: Present on admission in the setting of C5-C6 osteomyelitis and serratia bacteremia.  Now resolved.  I have seen and examined this patient myself. I have spent 32 minutes in his evaluation and care.  DVT prophylaxis: Lovenox Code Status: Full code Family / Patient Communication: Discussed with patient. Disposition Plan: He states he has a court date on October 12 at which time he anticipates to be released from jail.  Contacted infirmary and discharge plan once pain meds de-escalated.Karie Kirks, DO [facility] Direct contact: see [email or web address]  7PM-7AM contact night coverage as above 12/23/2018, 3:35 PM  LOS: 6 days

## 2018-12-24 LAB — COMPREHENSIVE METABOLIC PANEL
ALT: 45 U/L — ABNORMAL HIGH (ref 0–44)
AST: 42 U/L — ABNORMAL HIGH (ref 15–41)
Albumin: 3.2 g/dL — ABNORMAL LOW (ref 3.5–5.0)
Alkaline Phosphatase: 145 U/L — ABNORMAL HIGH (ref 38–126)
Anion gap: 10 (ref 5–15)
BUN: 24 mg/dL — ABNORMAL HIGH (ref 6–20)
CO2: 26 mmol/L (ref 22–32)
Calcium: 9.7 mg/dL (ref 8.9–10.3)
Chloride: 102 mmol/L (ref 98–111)
Creatinine, Ser: 0.6 mg/dL — ABNORMAL LOW (ref 0.61–1.24)
GFR calc Af Amer: >60 mL/min (ref >60)
GFR calc non Af Amer: >60 mL/min (ref >60)
Glucose, Bld: 121 mg/dL — ABNORMAL HIGH (ref 70–99)
Potassium: 4.7 mmol/L (ref 3.5–5.1)
Sodium: 138 mmol/L (ref 135–145)
Total Bilirubin: 0.4 mg/dL (ref 0.3–1.2)
Total Protein: 7.4 g/dL (ref 6.5–8.1)

## 2018-12-24 LAB — CBC WITH DIFFERENTIAL/PLATELET
Abs Immature Granulocytes: 0.06 10*3/uL (ref 0.00–0.07)
Basophils Absolute: 0 10*3/uL (ref 0.0–0.1)
Basophils Relative: 1 %
Eosinophils Absolute: 0.2 10*3/uL (ref 0.0–0.5)
Eosinophils Relative: 3 %
HCT: 37.1 % — ABNORMAL LOW (ref 39.0–52.0)
Hemoglobin: 11.9 g/dL — ABNORMAL LOW (ref 13.0–17.0)
Immature Granulocytes: 1 %
Lymphocytes Relative: 22 %
Lymphs Abs: 1.4 10*3/uL (ref 0.7–4.0)
MCH: 26.4 pg (ref 26.0–34.0)
MCHC: 32.1 g/dL (ref 30.0–36.0)
MCV: 82.3 fL (ref 80.0–100.0)
Monocytes Absolute: 0.8 10*3/uL (ref 0.1–1.0)
Monocytes Relative: 13 %
Neutro Abs: 3.9 10*3/uL (ref 1.7–7.7)
Neutrophils Relative %: 60 %
Platelets: 203 10*3/uL (ref 150–400)
RBC: 4.51 MIL/uL (ref 4.22–5.81)
RDW: 19.9 % — ABNORMAL HIGH (ref 11.5–15.5)
WBC: 6.4 10*3/uL (ref 4.0–10.5)
nRBC: 0 % (ref 0.0–0.2)

## 2018-12-24 LAB — C-REACTIVE PROTEIN: CRP: 1.6 mg/dL — ABNORMAL HIGH (ref <1.0)

## 2018-12-24 MED ORDER — OXYCODONE HCL 5 MG PO TABS
10.0000 mg | ORAL_TABLET | ORAL | Status: DC | PRN
  Administered 2018-12-24 – 2018-12-29 (×26): 10 mg via ORAL
  Filled 2018-12-24 (×26): qty 2

## 2018-12-24 NOTE — Progress Notes (Signed)
PROGRESS NOTE  David Short FXT:[id number] DOB: 02-10-1971 DOA: 12/12/2018 PCP: Default, Provider, MD  Brief History   48 year old Caucasian male with past medical history significant for IV heroin and cocaine abuse, chronic back pain and prior fractures of left arm and left ankle treated operatively, now presented from jail with complaints of neck and back pain.Underwent CSF examination which showed some pleocytosis,  Admitted initially with tentative diagnosis of aseptic meningitis -on IV broad-spectrum IV antibiotics as well as antiviral medication.Blood cultures come back positive for Serratia. MRI brain / MRI C-spine with and without contrast was performed which showed evidence of C5-C6 discitis as well as osteomyelitis with ventral phlegmon/epidural abscess. Patient was transferred to [facility] for further assessment, management and surgery. Patient underwent decompressive anterior cervical discectomy C5-6, anterior cervical arthrodesisC5-6utilizing a cortico-cancellusbone graft and anterior cervical plating C5-6utilizing a Alphatecplateon 12/14/2018 by Neurosurgery.Infectious disease team is directing antibiotics management. Neurosurgery following along.  Consultants  . Neurosurgery . Infectious Disease  Procedures  . Decompressive anterior cervical discectomy, C5-C6.  Marland Kitchen Anterior cervical arthrodesis C5- C6 utilizing a cortico-cancellus bone graft . Anterior cervical plating C5-C6 utilizing an alphate plate.  Antibiotics   Anti-infectives (From admission, onward)   Start     Dose/Rate Route Frequency Ordered Stop   12/22/18 1445  ceFEPIme (MAXIPIME) 1 g in sodium chloride 0.9 % 100 mL IVPB  Status:  Discontinued     1 g 200 mL/hr over 30 Minutes Intravenous Every 8 hours 12/22/18 1439 12/22/18 1441   12/22/18 1445  ceFEPIme (MAXIPIME) 2 g in sodium chloride 0.9 % 100 mL IVPB     2 g 200 mL/hr over 30 Minutes Intravenous Every 8 hours 12/22/18 1441     12/17/18  1045  levofloxacin (LEVAQUIN) tablet 750 mg  Status:  Discontinued     750 mg Oral Daily 12/17/18 1040 12/22/18 1439   12/16/18 1230  cefTRIAXone (ROCEPHIN) 2 g in sodium chloride 0.9 % 100 mL IVPB  Status:  Discontinued     2 g 200 mL/hr over 30 Minutes Intravenous Every 24 hours 12/16/18 0922 12/17/18 1040   12/14/18 1028  bacitracin 50,000 Units in sodium chloride 0.9 % 500 mL irrigation  Status:  Discontinued       As needed 12/14/18 1028 12/14/18 1212   12/14/18 0941  ceFAZolin (ANCEF) 2-4 GM/100ML-% IVPB    Note to Pharmacy: Aquilla Hacker   : cabinet override      12/14/18 0941 12/14/18 2144   12/13/18 2000  ceFEPIme (MAXIPIME) 2 g in sodium chloride 0.9 % 100 mL IVPB  Status:  Discontinued     2 g 200 mL/hr over 30 Minutes Intravenous Every 8 hours 12/13/18 1054 12/13/18 1056   12/13/18 2000  ceFEPIme (MAXIPIME) 2 g in sodium chloride 0.9 % 100 mL IVPB  Status:  Discontinued     2 g 200 mL/hr over 30 Minutes Intravenous Every 8 hours 12/13/18 1056 12/16/18 0922   12/13/18 1100  ceFEPIme (MAXIPIME) 2 g in sodium chloride 0.9 % 100 mL IVPB  Status:  Discontinued     2 g 200 mL/hr over 30 Minutes Intravenous STAT 12/13/18 1054 12/13/18 1054   12/13/18 1100  ceFEPIme (MAXIPIME) 2 g in sodium chloride 0.9 % 100 mL IVPB     2 g 200 mL/hr over 30 Minutes Intravenous STAT 12/13/18 1055 12/13/18 1207   12/13/18 0600  cefTRIAXone (ROCEPHIN) 2 g in sodium chloride 0.9 % 100 mL IVPB  Status:  Discontinued  2 g 200 mL/hr over 30 Minutes Intravenous Every 12 hours 12/12/18 1715 12/13/18 1042   12/13/18 0200  vancomycin (VANCOCIN) IVPB 750 mg/150 ml premix  Status:  Discontinued     750 mg 150 mL/hr over 60 Minutes Intravenous Every 8 hours 12/12/18 1740 12/13/18 0930   12/12/18 1745  acyclovir (ZOVIRAX) 635 mg in dextrose 5 % 100 mL IVPB  Status:  Discontinued     10 mg/kg  63.5 kg 112.7 mL/hr over 60 Minutes Intravenous Every 8 hours 12/12/18 1740 12/13/18 1042   12/12/18 1715   vancomycin (VANCOCIN) IVPB 1000 mg/200 mL premix  Status:  Discontinued     1,000 mg 200 mL/hr over 60 Minutes Intravenous  Once 12/12/18 1715 12/12/18 1724   12/12/18 1500  acyclovir (ZOVIRAX) 635 mg in dextrose 5 % 100 mL IVPB     10 mg/kg  63.5 kg 112.7 mL/hr over 60 Minutes Intravenous  Once 12/12/18 1350 12/12/18 1824   12/12/18 1430  vancomycin (VANCOCIN) 1,250 mg in sodium chloride 0.9 % 250 mL IVPB     1,250 mg 166.7 mL/hr over 90 Minutes Intravenous  Once 12/12/18 1350 12/12/18 1657   12/12/18 1400  cefTRIAXone (ROCEPHIN) 2 g in sodium chloride 0.9 % 100 mL IVPB     2 g 200 mL/hr over 30 Minutes Intravenous  Once 12/12/18 1350 12/12/18 1657     Subjective  The patient is sleeping soundly. He awakens briefly and then quickly goes back to sleep.  Objective   Vitals:  Vitals:   12/24/18 0753 12/24/18 1454  BP: 107/76 119/82  Pulse:    Resp: 17 15  Temp: 97.7 F (36.5 C) 98.1 F (36.7 C)  SpO2:  90%   Exam:  Constitutional:  . The patient is sleeping soundly. Awakens briefly to stimulation, but then quickly goes back to sleep. He is in no acute distress. Respiratory:  . No increased work of breathing. . No wheezes, rales, or rhonchi . No tactile fremitus Cardiovascular:  . Regular rate and rhythm . No murmurs, ectopy, or gallups. . No lateral PMI. No thrills. Abdomen:  . Abdomen is soft, non-tender, non-distended . No hernias, masses, or organomegaly . Normoactive bowel sounds.  Musculoskeletal:  . No cyanosis, clubbing, or edema Skin:  . No rashes, lesions, ulcers . palpation of skin: no induration or nodules Neurologic:  . Sleeping Psychiatric:  Sleeping. I have personally reviewed the following:   Today's Data  . Vitals, Blood Cultures.  Scheduled Meds: . feeding supplement (ENSURE ENLIVE)  237 mL Oral BID BM  . senna  1 tablet Oral BID  . sodium chloride flush  3 mL Intravenous Q12H   Continuous Infusions: . sodium chloride    . 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  Seen by infectious diseases for follow-up and recommend transition to oral antibiotics at the time of discharge: Potentially Levaquin 750 mg daily until November 22. However, NS has expressed concerns about failure of this agent for this indication in the past. EKG for QTC obtained today and okay at 435 msec. Neurosurgery following along. I appreciate their help.  Neck Pain: The patient is sleeping soundly. Will decrease dose of oxycodone. Monitor. Pt is habituated to opiates due to his heroine abuse, and he has undergone an extensive surgery for his cervical osteomyelitis. This accounts for the slow opiate weaning process. The patient will not be able to receive anything other than Tylenol #3 [facility] the [facility].   Serratia bacteremia: Blood cultures and wound cultures growing Serratia.  Levaquin course as discussed above.  However, Neurosurgery has expressed concerns about failure of this agent in treating vertebral osteomyelitis in the past. The patient is continued on cefepime currently.  Polysubstance abuse:  Urine drug screen unremarkable on presentation.  Has history of cocaine/heroin abuse.Watch for signs of withdrawal.  Currently on opiates but in the setting of postoperative pain-discussed with patient regarding slow taper with nearing discharge plan. He will only be able to receive Tylenol #3 at the prison. He is currently having severe neck pain and Oxycodone has been reduced to10 mg Q4h. This will be a slow process given the source of his pain and his habituation to narcotics.   Elevated blood pressure: In the setting of  problem #1, pain and history of drug use.  Currently normotensive.  D/c IV fluids.  Continue to monitor without meds  Mild thrombocytopenia: Present on admission in the setting of C5-C6 osteomyelitis and serratia bacteremia.  Now resolved.  I have seen and examined this patient myself. I have spent 30 minutes in his evaluation and care.  DVT prophylaxis: Lovenox Code Status: Full code Family / Patient Communication: Discussed with patient. Disposition Plan: He states he has a court date on October 12 at which time he anticipates to be released from jail.  Contacted infirmary and discharge plan once pain meds de-escalated.Fran [employer].  Iyani Dresner, DO [facility] Direct contact: see [email or web address]  7PM-7AM contact night coverage as above 12/24/2018, 4:06 PM  LOS: 6 days

## 2018-12-24 NOTE — Progress Notes (Signed)
Physical Therapy Treatment Patient Details Name: David Short MRN: [id number] DOB: 1970-05-04 Today's Date: 12/24/2018    History of Present Illness 48 year old Caucasian male with past medical history significant for IV heroin and cocaine abuse, chronic back pain and prior fractures of left arm and left ankle treated operatively.  Patient was admitted with complaints of neck and back pain.  Work-up done revealed osteomyelitis of C5-C6 spine with a phlegmon.  He underwent ACDF 12/14/18.    PT Comments    Patient requiring O2 to maintain sats >90% throughout session. On arrival on room air 89-90%; applied 2L and with cues for pursed lip breathing incr to 92%. While walking up to 100%. On return to sitting, back to room air and quickly dropped from 100% to 87% with O2 resumed at 1.5L and sats 94% with pt supine.   Overall ambulation more limited this date with pt reporting gout-like pain in rt forefoot.     Follow Up Recommendations  No PT follow up     Equipment Recommendations  None recommended by PT    Recommendations for Other Services       Precautions / Restrictions Precautions Precautions: Cervical Precaution Booklet Issued: Yes (comment) Precaution Comments: pt able to verbalize and adhere to all precautions without cues Required Braces or Orthoses: Cervical Brace Cervical Brace: Hard collar;At all times Restrictions Weight Bearing Restrictions: No    Mobility  Bed Mobility Overal bed mobility: Needs Assistance Bed Mobility: Rolling;Sidelying to Sit;Sit to Sidelying Rolling: Modified independent (Device/Increase time) Sidelying to sit: Modified independent (Device/Increase time)     Sit to sidelying: Supervision General bed mobility comments: pt able to verbalize technique prior to executing; return to bed vc for bringing trailing arm in front of body to prevent rolling shoulders back too soon)  Transfers Overall transfer level: Modified independent Equipment  used: None Transfers: Sit to/from Stand Sit to Stand: Modified independent (Device/Increase time)            Ambulation/Gait Ambulation/Gait assistance: Min guard   Assistive device: None Gait Pattern/deviations: Step-through pattern;Decreased stride length;Wide base of support;Antalgic(limited by shackles and rt foot pain) Gait velocity: decreased   General Gait Details: more antalgic than 10/5; pt reporting ?gout in 1st toe joint; +required 2L O2 to maintain sats >90%   Stairs Stairs: Yes Stairs assistance: Min guard Stair Management: One rail Left;Step to pattern;Forwards Number of Stairs: 3 General stair comments: limited by O2 and IV tubing; stepped up 1 step and reversed down to floor x 3   Wheelchair Mobility    Modified Rankin (Stroke Patients Only)       Balance Overall balance assessment: Needs assistance Sitting-balance support: Feet supported Sitting balance-Leahy Scale: Fair     Standing balance support: During functional activity Standing balance-Leahy Scale: Fair                              Cognition Arousal/Alertness: Awake/alert Behavior During Therapy: Impulsive(slightly better) Overall Cognitive Status: No family/caregiver present to determine baseline cognitive functioning Area of Impairment: Safety/judgement                         Safety/Judgement: Decreased awareness of safety     General Comments: decr awareness of lines/tubes (however improved compared to 10/5      Exercises Other Exercises Other Exercises: sit to stand x 3; vc to not use hands/UEs however pt still using to ascend, but  able to not use with slow descent; pt reporting significant neck pain and "medicine not working yet" therefore limited number    General Comments        Pertinent Vitals/Pain Pain Assessment: 0-10 Pain Score: 7  Pain Location: neck and rt foot (?gout per pt) Pain Descriptors / Indicators:  Discomfort;Sore;Aching;Grimacing;Guarding;Sharp Pain Intervention(s): Limited activity within patient's tolerance;Monitored during session;Premedicated before session;Repositioned    Home Living                      Prior Function            PT Goals (current goals can now be found in the care plan section) Acute Rehab PT Goals Patient Stated Goal: decrease pain Time For Goal Achievement: 12/29/18 Potential to Achieve Goals: Good Progress towards PT goals: Progressing toward goals    Frequency    Min 3X/week      PT Plan Current plan remains appropriate    Co-evaluation              AM-PAC PT "6 Clicks" Mobility   Outcome Measure  Help needed turning from your back to your side while in a flat bed without using bedrails?: None Help needed moving from lying on your back to sitting on the side of a flat bed without using bedrails?: None Help needed moving to and from a bed to a chair (including a wheelchair)?: None Help needed standing up from a chair using your arms (e.g., wheelchair or bedside chair)?: None Help needed to walk in hospital room?: A Little Help needed climbing 3-5 steps with a railing? : A Little 6 Click Score: 22    End of Session Equipment Utilized During Treatment: Cervical collar;Gait belt;Oxygen Activity Tolerance: Patient limited by pain Patient left: Other (comment);in bed;with call bell/phone within reach(with guard present) Nurse Communication: Mobility status PT Visit Diagnosis: Difficulty in walking, not elsewhere classified (R26.2);Pain Pain - Right/Left: Right Pain - part of body: Ankle and joints of foot     Time: 0947-0962 PT Time Calculation (min) (ACUTE ONLY): 29 min  Charges:  $Gait Training: 23-37 mins                       Barry Brunner, PT       Rexanne Mano 12/24/2018, 9:37 AM

## 2018-12-25 NOTE — Progress Notes (Signed)
Occupational Therapy Treatment Patient Details Name: David Short MRN: [id number] DOB: Jul 06, 1970 Today's Date: 12/25/2018    History of present illness 48 year old Caucasian male with past medical history significant for IV heroin and cocaine abuse, chronic back pain and prior fractures of left arm and left ankle treated operatively.  Patient was admitted with complaints of neck and back pain.  Work-up done revealed osteomyelitis of C5-C6 spine with a phlegmon.  He underwent ACDF 12/14/18.   OT comments  Pt c/o 10/10 pain upon OT arrival, but agreeable to session. Pt able to state 3/3 cervical precautions this session and performed log roll with no cues. Pt reports increased pain upon sitting EOB in bilat shoulders. Education provided on scapular depression as pt noted to be elevating scapula secondary to pain. Instructed pt on scapular therapeutic exercises to assist with pain management in neck/ shoulders. Pt supervision for safety for functional mobility from EOB> bathroom. Pt noted to use O2 tank to push up from EOB. Pt on 2L SaO2 throughout session with sats fluctuating from 88-93%. DC plan remains appropriate. Will continue to follow acutely for OT needs.   Follow Up Recommendations  No OT follow up    Equipment Recommendations  None recommended by OT    Recommendations for Other Services      Precautions / Restrictions Precautions Precautions: Cervical Precaution Booklet Issued: Yes (comment) Precaution Comments: pt able to verbalize and adhere to all precautions without cues Required Braces or Orthoses: Cervical Brace Cervical Brace: Hard collar;At all times Restrictions Weight Bearing Restrictions: No       Mobility Bed Mobility Overal bed mobility: Modified Independent Bed Mobility: Rolling;Sidelying to Sit;Sit to Sidelying Rolling: Modified independent (Device/Increase time) Sidelying to sit: Modified independent (Device/Increase time) Supine to sit: Modified  independent (Device/Increase time);HOB elevated     General bed mobility comments: pt able to verbalize technique prior to executing; assisted with positioning to assist with pain management; applied heat packs to shoulders as pt c/o 10/10 pain  Transfers Overall transfer level: Modified independent Equipment used: None(noted pt trying to push up with O2 tank from bed) Transfers: Sit to/from Stand Sit to Stand: Modified independent (Device/Increase time)         General transfer comment: pt quick to stand; overall supervision for safety and line management, noted to be using O2 to steady himself during sit>stand, balance deficts observed but could be d/t shackles    Balance Overall balance assessment: Needs assistance Sitting-balance support: Feet supported Sitting balance-Leahy Scale: Good Sitting balance - Comments: able to sit EOB with no assist needed   Standing balance support: During functional activity Standing balance-Leahy Scale: Fair Standing balance comment: able to static stand at sink for grooming                           ADL either performed or assessed with clinical judgement   ADL       Grooming: Wash/dry hands;Standing Grooming Details (indicate cue type and reason): supervision for safety in standing                 Toilet Transfer: Ambulation;Regular Chartered certified accountant Details (indicate cue type and reason): supervision for safety with line managment of O2 Toileting- Clothing Manipulation and Hygiene: Sit to/from stand;Supervision/safety       Functional mobility during ADLs: Supervision/safety General ADL Comments: pt able to carryover precautions much better this session, no cues needed, able to identify correct functional examples of  precautions, supervision overall for safety for functional mobility with no AD and standing ADLs     Vision       Perception     Praxis      Cognition Arousal/Alertness:  Awake/alert Behavior During Therapy: WFL for tasks assessed/performed;Impulsive Overall Cognitive Status: No family/caregiver present to determine baseline cognitive functioning Area of Impairment: Safety/judgement                         Safety/Judgement: Decreased awareness of safety     General Comments: decreased awareness of safety; still slightly impulsive but improved overall        Exercises Other Exercises Other Exercises: scapular elevation/ depression from EOB x5 Other Exercises: scapular protraction/retraction from EOB x5   Shoulder Instructions       General Comments guard in attendance throughout session; pt on 2L SaO2 with sats fluctuating from 88- 93%; pt reports no SOB throughout session    Pertinent Vitals/ Pain       Pain Score: 10-Worst pain ever Pain Location: neck and shoulders Pain Descriptors / Indicators: Discomfort;Sore;Aching;Grimacing;Guarding;Sharp Pain Intervention(s): Limited activity within patient's tolerance;Monitored during session;Repositioned;Patient requesting pain meds-RN notified  Home Living                                          Prior Functioning/Environment              Frequency  Min 2X/week        Progress Toward Goals  OT Goals(current goals can now be found in the care plan section)  Progress towards OT goals: Progressing toward goals  Acute Rehab OT Goals Patient Stated Goal: decrease pain OT Goal Formulation: With patient Time For Goal Achievement: 12/29/18 Potential to Achieve Goals: Good  Plan Discharge plan remains appropriate    Co-evaluation                 AM-PAC OT "6 Clicks" Daily Activity     Outcome Measure   Help from another person eating meals?: None Help from another person taking care of personal grooming?: None Help from another person toileting, which includes using toliet, bedpan, or urinal?: None Help from another person bathing (including washing,  rinsing, drying)?: A Little Help from another person to put on and taking off regular upper body clothing?: A Little Help from another person to put on and taking off regular lower body clothing?: A Little 6 Click Score: 21    End of Session Equipment Utilized During Treatment: Cervical collar  OT Visit Diagnosis: Pain;Other abnormalities of gait and mobility (R26.89) Pain - part of body: Shoulder(neck and bilateral shoulders)   Activity Tolerance Patient tolerated treatment well   Patient Left in bed;with call bell/phone within reach;with family/visitor present   Nurse Communication Mobility status;Patient requests pain meds;Other (comment)(provided heat packs and applied cushion to nasal cannula to assist with skin breakdown on ears)        Time: 4193-7902 OT Time Calculation (min): 26 min  Charges: OT General Charges $OT Visit: 1 Visit OT Treatments $Self Care/Home Management : 23-37 mins   Pollyann Glen [place], COTA/L Acute Rehabilitation Services [phone]    Angelina Pih 12/25/2018, 11:19 AM

## 2018-12-25 NOTE — Progress Notes (Signed)
Nutrition Follow-up  DOCUMENTATION CODES:   Non-severe (moderate) malnutrition in context of chronic illness  INTERVENTION:  Continue Ensure Enlive po BID, each supplement provides 350 kcal and 20 grams of protein   Encourage adequate PO intake.   NUTRITION DIAGNOSIS:   Moderate Malnutrition related to chronic illness(drug abuse) as evidenced by moderate fat depletion, moderate muscle depletion; ongoing  GOAL:   Patient will meet greater than or equal to 90% of their needs; met  MONITOR:   PO intake, Supplement acceptance, Skin, Weight trends, Labs, I & O's  REASON FOR ASSESSMENT:   Malnutrition Screening Tool    ASSESSMENT:   48 year old Caucasian male with past medical history significant for IV heroin and cocaine abuse, chronic back pain and prior fractures of left arm and left ankle treated operatively.  Patient was admitted with complaints of neck and back pain.  Work-up done revealed osteomyelitis of C5-C6 spine with a phlegmon.  Patient was [facility] to [facility] for further assessment, management and surgery.  [facility].  PROCEDURE(9/28):  Decompressive anterior cervical discectomy C5-6 Anterior cervical arthrodesisC5-6utilizing a cortico-cancellusbone graft Anterior cervical plating C5-6utilizing a Alphatecplate   Meal completion has been 100%. Appetite and intake has been good. Pt currently has Ensure ordered and has been consuming them. Will continue with current orders to aid in caloric and protein needs.   Labs and medications reviewed.   Diet Order:   Diet Order            Diet regular Room service appropriate? Yes with Assist; Fluid consistency: Thin  Diet effective now              EDUCATION NEEDS:   Not appropriate for education at this time  Skin:  Skin Assessment: Skin Integrity Issues: Skin Integrity Issues:: Incisions Incisions: neck  Last BM:  10/6  Height:   Ht Readings from Last 1 Encounters:   12/13/18 '5\' 10"'  (1.778 m)    Weight:   Wt Readings from Last 1 Encounters:  12/25/18 70 kg    Ideal Body Weight:  75.45 kg  BMI:  Body mass index is 22.14 kg/m.  Estimated Nutritional Needs:   Kcal:  1900-2100  Protein:  90-105 grams  Fluid:  1.9 - 2.1 L/day    Corrin Parker, MS, RD, LDN Pager # [phone] After hours/ weekend pager # [phone]

## 2018-12-25 NOTE — Plan of Care (Signed)
Pt is resting in bed, on 2L [place] , breathing is even and unlabored. Pt verbalized understanding of oxygen use at the beginning of the shift and has been compliant with keeping it on throughout the night. Pt stands up to use the bathroom with standby assist and tolerates activity well. Pt is alert and oriented, follows commands. Pt has requested pain medication frequently, he has been reassured and PRN medication has been given per order. Pt has no signs of shortness of breath or discomfort at this time. Call bell is within and bed is in lowest position. Law Museum/gallery conservator is at bedside. Shackles have been on pt's feet throughout the night. Problem: Education: Goal: Knowledge of General Education information will improve Description: Including pain rating scale, medication(s)/side effects and non-pharmacologic comfort measures Outcome: Progressing   Problem: Clinical Measurements: Goal: Ability to maintain clinical measurements within normal limits will improve Outcome: Progressing Goal: Respiratory complications will improve Outcome: Progressing   Problem: Activity: Goal: Risk for activity intolerance will decrease Outcome: Progressing   Problem: Nutrition: Goal: Adequate nutrition will be maintained Outcome: Progressing   Problem: Pain Managment: Goal: General experience of comfort will improve Outcome: Progressing

## 2018-12-25 NOTE — Progress Notes (Signed)
Physical Therapy Treatment Patient Details Name: David Short MRN: [id number] DOB: Feb 14, 1971 Today's Date: 12/25/2018    History of Present Illness Pt is a 48 year old Caucasian male with past medical history significant for IV heroin and cocaine abuse, chronic back pain and prior fractures of left arm and left ankle treated operatively.  Patient was admitted with complaints of neck and back pain.  Work-up done revealed osteomyelitis of C5-C6 spine with a phlegmon.  He underwent ACDF 12/14/18.    PT Comments    Pt continuing to make steady, slow progress but remains limited secondary to pain. PT will continue to follow pt acutely to progress mobility as tolerated.    Follow Up Recommendations  No PT follow up     Equipment Recommendations  None recommended by PT    Recommendations for Other Services       Precautions / Restrictions Precautions Precautions: Cervical Required Braces or Orthoses: Cervical Brace Cervical Brace: Hard collar;At all times Restrictions Weight Bearing Restrictions: No    Mobility  Bed Mobility Overal bed mobility: Modified Independent                Transfers Overall transfer level: Modified independent Equipment used: None                Ambulation/Gait Ambulation/Gait assistance: Min guard Gait Distance (Feet): 200 Feet Assistive device: None Gait Pattern/deviations: Antalgic;Decreased stride length;Step-through pattern Gait velocity: decreased   General Gait Details: pt with antalgic gait pattern secondary to gout pain in R foot; no instability or LOB; pt on RA throughout with SPO2 maintaining >93% throughout   Stairs             Wheelchair Mobility    Modified Rankin (Stroke Patients Only)       Balance Overall balance assessment: Needs assistance Sitting-balance support: Feet supported Sitting balance-Leahy Scale: Good     Standing balance support: During functional activity Standing balance-Leahy  Scale: Fair                              Cognition Arousal/Alertness: Awake/alert Behavior During Therapy: WFL for tasks assessed/performed;Impulsive Overall Cognitive Status: No family/caregiver present to determine baseline cognitive functioning Area of Impairment: Safety/judgement                         Safety/Judgement: Decreased awareness of safety            Exercises      General Comments        Pertinent Vitals/Pain Pain Assessment: Faces Faces Pain Scale: Hurts little more Pain Location: neck and shoulders Pain Descriptors / Indicators: Discomfort;Sore;Aching;Grimacing;Guarding;Sharp Pain Intervention(s): Monitored during session;Repositioned    Home Living                      Prior Function            PT Goals (current goals can now be found in the care plan section) Acute Rehab PT Goals PT Goal Formulation: With patient Time For Goal Achievement: 12/29/18 Potential to Achieve Goals: Good Progress towards PT goals: Progressing toward goals    Frequency    Min 3X/week      PT Plan Current plan remains appropriate    Co-evaluation              AM-PAC PT "6 Clicks" Mobility   Outcome Measure  Help needed turning from your  back to your side while in a flat bed without using bedrails?: None Help needed moving from lying on your back to sitting on the side of a flat bed without using bedrails?: None Help needed moving to and from a bed to a chair (including a wheelchair)?: None Help needed standing up from a chair using your arms (e.g., wheelchair or bedside chair)?: None Help needed to walk in hospital room?: A Little Help needed climbing 3-5 steps with a railing? : A Little 6 Click Score: 22    End of Session Equipment Utilized During Treatment: Cervical collar Activity Tolerance: Patient limited by pain Patient left: in bed;with call bell/phone within reach Nurse Communication: Mobility status PT  Visit Diagnosis: Difficulty in walking, not elsewhere classified (R26.2);Pain Pain - Right/Left: Right Pain - part of body: Ankle and joints of foot     Time: 2979-8921 PT Time Calculation (min) (ACUTE ONLY): 16 min  Charges:  $Gait Training: 8-22 mins                     Sherie Don, [place], DPT  Acute Rehabilitation Services Pager [phone] Office [place] 12/25/2018, 4:56 PM

## 2018-12-25 NOTE — Progress Notes (Signed)
PROGRESS NOTE  David Short LSL:[id number] DOB: 09/16/1970 DOA: 12/12/2018 PCP: Default, Provider, MD  Brief History   48 year old Caucasian male with past medical history significant for IV heroin and cocaine abuse, chronic back pain and prior fractures of left arm and left ankle treated operatively, now presented from jail with complaints of neck and back pain.Underwent CSF examination which showed some pleocytosis,  Admitted initially with tentative diagnosis of aseptic meningitis -on IV broad-spectrum IV antibiotics as well as antiviral medication.Blood cultures come back positive for Serratia. MRI brain / MRI C-spine with and without contrast was performed which showed evidence of C5-C6 discitis as well as osteomyelitis with ventral phlegmon/epidural abscess. [facility] assessment, management and surgery. Patient underwent decompressive anterior cervical discectomy C5-6, anterior cervical arthrodesisC5-6utilizing a cortico-cancellusbone graft and anterior cervical plating C5-6utilizing a Alphatecplateon 12/14/2018 by Neurosurgery.Infectious disease team is directing antibiotics management. Neurosurgery following along.  CRP rechecked after 7 days of oral levaquin in lieu of cefepime. It increased from 1.4 to 1.6. The patient is back on IV cefepime.  Consultants  . Neurosurgery . Infectious Disease  Procedures  . Decompressive anterior cervical discectomy, C5-C6.  Marland Kitchen Anterior cervical arthrodesis C5- C6 utilizing a cortico-cancellus bone graft . Anterior cervical plating C5-C6 utilizing an alphate plate.  Antibiotics   Anti-infectives (From admission, onward)   Start     Dose/Rate Route Frequency Ordered Stop   12/22/18 1445  ceFEPIme (MAXIPIME) 1 g in sodium chloride 0.9 % 100 mL IVPB  Status:  Discontinued     1 g 200 mL/hr over 30 Minutes Intravenous Every 8 hours 12/22/18 1439 12/22/18 1441   12/22/18 1445  ceFEPIme  (MAXIPIME) 2 g in sodium chloride 0.9 % 100 mL IVPB     2 g 200 mL/hr over 30 Minutes Intravenous Every 8 hours 12/22/18 1441     12/17/18 1045  levofloxacin (LEVAQUIN) tablet 750 mg  Status:  Discontinued     750 mg Oral Daily 12/17/18 1040 12/22/18 1439   12/16/18 1230  cefTRIAXone (ROCEPHIN) 2 g in sodium chloride 0.9 % 100 mL IVPB  Status:  Discontinued     2 g 200 mL/hr over 30 Minutes Intravenous Every 24 hours 12/16/18 0922 12/17/18 1040   12/14/18 1028  bacitracin 50,000 Units in sodium chloride 0.9 % 500 mL irrigation  Status:  Discontinued       As needed 12/14/18 1028 12/14/18 1212   12/14/18 0941  ceFAZolin (ANCEF) 2-4 GM/100ML-% IVPB    Note to Pharmacy: Aquilla Hacker   : cabinet override      12/14/18 0941 12/14/18 2144   12/13/18 2000  ceFEPIme (MAXIPIME) 2 g in sodium chloride 0.9 % 100 mL IVPB  Status:  Discontinued     2 g 200 mL/hr over 30 Minutes Intravenous Every 8 hours 12/13/18 1054 12/13/18 1056   12/13/18 2000  ceFEPIme (MAXIPIME) 2 g in sodium chloride 0.9 % 100 mL IVPB  Status:  Discontinued     2 g 200 mL/hr over 30 Minutes Intravenous Every 8 hours 12/13/18 1056 12/16/18 0922   12/13/18 1100  ceFEPIme (MAXIPIME) 2 g in sodium chloride 0.9 % 100 mL IVPB  Status:  Discontinued     2 g 200 mL/hr over 30 Minutes Intravenous STAT 12/13/18 1054 12/13/18 1054   12/13/18 1100  ceFEPIme (MAXIPIME) 2 g in sodium chloride 0.9 % 100 mL IVPB     2 g 200 mL/hr over 30 Minutes Intravenous STAT 12/13/18  1055 12/13/18 1207   12/13/18 0600  cefTRIAXone (ROCEPHIN) 2 g in sodium chloride 0.9 % 100 mL IVPB  Status:  Discontinued     2 g 200 mL/hr over 30 Minutes Intravenous Every 12 hours 12/12/18 1715 12/13/18 1042   12/13/18 0200  vancomycin (VANCOCIN) IVPB 750 mg/150 ml premix  Status:  Discontinued     750 mg 150 mL/hr over 60 Minutes Intravenous Every 8 hours 12/12/18 1740 12/13/18 0930   12/12/18 1745  acyclovir (ZOVIRAX) 635 mg in dextrose 5 % 100 mL IVPB  Status:   Discontinued     10 mg/kg  63.5 kg 112.7 mL/hr over 60 Minutes Intravenous Every 8 hours 12/12/18 1740 12/13/18 1042   12/12/18 1715  vancomycin (VANCOCIN) IVPB 1000 mg/200 mL premix  Status:  Discontinued     1,000 mg 200 mL/hr over 60 Minutes Intravenous  Once 12/12/18 1715 12/12/18 1724   12/12/18 1500  acyclovir (ZOVIRAX) 635 mg in dextrose 5 % 100 mL IVPB     10 mg/kg  63.5 kg 112.7 mL/hr over 60 Minutes Intravenous  Once 12/12/18 1350 12/12/18 1824   12/12/18 1430  vancomycin (VANCOCIN) 1,250 mg in sodium chloride 0.9 % 250 mL IVPB     1,250 mg 166.7 mL/hr over 90 Minutes Intravenous  Once 12/12/18 1350 12/12/18 1657   12/12/18 1400  cefTRIAXone (ROCEPHIN) 2 g in sodium chloride 0.9 % 100 mL IVPB     2 g 200 mL/hr over 30 Minutes Intravenous  Once 12/12/18 1350 12/12/18 1657     Subjective  The patient is sleeping soundly. He awakens briefly and then quickly goes back to sleep.  Objective   Vitals:  Vitals:   12/25/18 0859 12/25/18 1049  BP: 106/79   Pulse: 100   Resp: 16   Temp: 97.9 F (36.6 C)   SpO2: 93% 92%   Exam:  Constitutional:  . The patient is awake and alert. He is complaining of pain. Respiratory:  . No increased work of breathing. . No wheezes, rales, or rhonchi . No tactile fremitus Cardiovascular:  . Regular rate and rhythm . No murmurs, ectopy, or gallups. . No lateral PMI. No thrills. Abdomen:  . Abdomen is soft, non-tender, non-distended . No hernias, masses, or organomegaly . Normoactive bowel sounds.  Musculoskeletal:  . No cyanosis, clubbing, or edema Skin:  . No rashes, lesions, ulcers . palpation of skin: no induration or nodules Neurologic:  . Sleeping Psychiatric:  Sleeping. I have personally reviewed the following:   Today's Data  . Vitals, Blood Cultures.  Scheduled Meds: . feeding supplement (ENSURE ENLIVE)  237 mL Oral BID BM  . senna  1 tablet Oral BID  . sodium chloride flush  3 mL Intravenous Q12H   Continuous  Infusions: . sodium chloride    . ceFEPime (MAXIPIME) IV 2 g (12/25/18 0932)    Principal Problem:   Aseptic meningitis Active Problems:   Accelerated hypertension   Sinus tachycardia   mild Thrombocytopenia (HCC)   S/P cervical spinal fusion   Neck pain   Malnutrition of moderate degree   LOS: 13 days   A & P   C5-C6 vertebral osteomyelitis in the setting of IV drug abuse: Currently on IV cefepime.  Seen by infectious diseases for follow-up and recommend transition to oral antibiotics at the time of discharge: Potentially Levaquin 750 mg daily until November 22. However, NS has expressed concerns about failure of this agent for this indication in the past. EKG for QTC  obtained today and okay at 435 msec. Neurosurgery following along. I appreciate their help. CRP rechecked after 7 days of oral levaquin in lieu of cefepime. It increased from 1.4 after admission to 1.6. The patient is back on IV cefepime.  Neck Pain: The patient is sleeping soundly. Will decrease dose of oxycodone. Monitor. Pt is habituated to opiates due to his heroine abuse, and he has undergone an extensive surgery for his cervical osteomyelitis. This accounts for the slow opiate weaning process. The patient will not be able to receive anything other than Tylenol #3 [facility].   Serratia bacteremia: Blood cultures and wound cultures growing Serratia.  Levaquin course as discussed above.  However, Neurosurgery has expressed concerns about failure of this agent in treating vertebral osteomyelitis in the past. CRP rechecked after 7 days of oral levaquin in lieu of cefepime. It increased from 1.4 mesured 2 days after admission to 1.6. The patient is back on IV cefepime. The patient is continued on cefepime currently.  Polysubstance abuse:  Urine drug screen unremarkable on presentation.  Has history of cocaine/heroin abuse.Watch for signs of withdrawal.  Currently on opiates but in the setting of postoperative  pain-discussed with patient regarding slow taper with nearing discharge plan. He will only be able to receive Tylenol #3 at the prison. He is currently having severe neck pain and Oxycodone has been reduced to10 mg Q4h. This will be a slow process given the source of his pain and his habituation to narcotics.   Elevated blood pressure: In the setting of problem #1, pain and history of drug use.  Currently normotensive.  D/c IV fluids.  Continue to monitor without meds  Mild thrombocytopenia: Present on admission in the setting of C5-C6 osteomyelitis and serratia bacteremia.  Now resolved.  I have seen and examined this patient myself. I have spent 34 minutes in his evaluation and care.  DVT prophylaxis: Lovenox Code Status: Full code Family / Patient Communication: Discussed with patient. Disposition Plan: He states he has a court date on October 12 at which time he anticipates to be released from jail.  Contacted infirmary and discharge plan once pain meds de-escalated.Fran [employer].  David Ratterree, DO [facility] Direct contact: see [email or web address]  7PM-7AM contact night coverage as above 12/25/2018, 2:59 PM  LOS: 6 days

## 2018-12-26 DIAGNOSIS — E440 Moderate protein-calorie malnutrition: Secondary | ICD-10-CM

## 2018-12-26 DIAGNOSIS — M4620 Osteomyelitis of vertebra, site unspecified: Secondary | ICD-10-CM | POA: Diagnosis present

## 2018-12-26 DIAGNOSIS — D696 Thrombocytopenia, unspecified: Secondary | ICD-10-CM

## 2018-12-26 NOTE — Progress Notes (Addendum)
PROGRESS NOTE    David Short  WUJ:[id number] DOB: Oct 20, 1970 DOA: 12/12/2018 PCP: Default, Provider, MD    Brief Narrative:   48 year old Caucasian male with past medical history significant for IV heroin and cocaine abuse, chronic back pain and prior fractures of left arm and left ankle treated operatively, now presented from jail with complaints of neck and back pain.Underwent CSF examination which showed some pleocytosis, Admitted initially with tentative diagnosis of aseptic meningitis -on IV broad-spectrum IV antibiotics as well as antiviral medication.Blood cultures come back positive for Serratia. MRI brain / MRI C-spine with and without contrast was performed which showed evidence of C5-C6 discitis as well as osteomyelitis with ventral phlegmon/epidural abscess. Patient was transferred to [facility] for further assessment, management and surgery. Patient underwent decompressive anterior cervical discectomy C5-6, anterior cervical arthrodesisC5-6utilizing a cortico-cancellusbone graft and anterior cervical plating C5-6utilizing a Alphatecplateon 12/14/2018 by Neurosurgery.Infectious disease team is directing antibiotics management. Neurosurgery following along.  Interim History: CRP rechecked after 7 days of oral levaquin in lieu of cefepime. It increased from 1.4 to 1.6 on 10/7. The patient is back on IV cefepime.  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 Seen by infectious diseases for follow-up and recommend transition to oral antibiotics at the time of discharge: Potentially Levaquin 750 mg daily until November 22. However, NS has expressed concerns about  failure of this agent for this indication in the past.Neurosurgery following along. CRP rechecked after 7 days of oral levaquin in lieu of cefepime. It increased from 1.4 after admission to 1.6 on 10/7. The patient is back on IV cefepime. -will continue IV cefepime -repeat CRP  -->elevated further, will consult ID again  Neck Pain: The patient is sleeping soundly. Will decrease dose of oxycodone. Monitor. Pt is habituated to opiates due to his heroine abuse, and he has undergone an extensive surgery for his cervical osteomyelitis. This accounts for the slow opiate weaning process. The patient will not be able to receive anything other than Tylenol #3 at the prison. -currently on oxycodone 10 mg every 4 hours  Serratia bacteremia:Blood cultures and wound cultures growing Serratia. Levaquin course as discussed above. However, Neurosurgery has expressed concerns about failure of this agent in treating vertebral osteomyelitis in the past. CRP rechecked after 7 days of oral levaquin in lieu of cefepime. The patient is back on IV cefepime. -The patient is continued on cefepime currently.  Polysubstance abuse: Urine drug screen unremarkable on presentation. Has history of cocaine/heroin abuse.Watch for signs of withdrawal. Currently on opiates but in the setting of postoperative pain-discussed with patient regarding slow taper with nearing discharge plan. He will only be able to receive Tylenol #3 at the prison. He is currently having severe neck pain and Oxycodone has been reduced to10 mg Q4h. This will be a slow process given the source of his pain and his habituation to narcotics.   Mild thrombocytopenia:Present on admission in the setting of C5-C6 osteomyelitis and serratia bacteremia. Now resolved.    Anti-infectives (From admission, onward)   Start     Dose/Rate Route Frequency Ordered Stop   12/22/18 1445  ceFEPIme (MAXIPIME) 1 g in sodium chloride 0.9 % 100 mL IVPB  Status:   Discontinued     1 g 200 mL/hr over 30 Minutes Intravenous Every 8 hours 12/22/18 1439 12/22/18 1441   12/22/18 1445  ceFEPIme (MAXIPIME) 2 g in sodium chloride 0.9 % 100 mL IVPB     2 g 200 mL/hr over 30 Minutes Intravenous Every 8 hours 12/22/18 1441     12/17/18 1045  levofloxacin (LEVAQUIN) tablet 750 mg  Status:  Discontinued     750 mg Oral Daily 12/17/18 1040 12/22/18 1439   12/16/18 1230  cefTRIAXone (ROCEPHIN) 2 g in sodium chloride 0.9 % 100 mL IVPB  Status:  Discontinued     2 g 200 mL/hr over 30 Minutes Intravenous Every 24 hours 12/16/18 0922 12/17/18 1040   12/14/18 1028  bacitracin 50,000 Units in sodium chloride 0.9 % 500 mL irrigation  Status:  Discontinued       As needed 12/14/18 1028 12/14/18 1212   12/14/18 0941  ceFAZolin (ANCEF) 2-4 GM/100ML-% IVPB    Note to Pharmacy: Aquilla Hacker   : cabinet override      12/14/18 0941 12/14/18 2144   12/13/18 2000  ceFEPIme (MAXIPIME) 2 g in sodium chloride 0.9 % 100 mL IVPB  Status:  Discontinued     2 g 200 mL/hr over 30 Minutes Intravenous Every 8 hours 12/13/18 1054 12/13/18 1056   12/13/18 2000  ceFEPIme (MAXIPIME) 2 g in sodium chloride 0.9 % 100 mL IVPB  Status:  Discontinued     2 g 200 mL/hr over 30 Minutes Intravenous Every 8 hours 12/13/18 1056 12/16/18 0922   12/13/18 1100  ceFEPIme (MAXIPIME) 2 g in sodium chloride 0.9 % 100 mL IVPB  Status:  Discontinued     2 g 200 mL/hr over 30 Minutes Intravenous STAT 12/13/18 1054 12/13/18 1054   12/13/18 1100  ceFEPIme (MAXIPIME) 2 g in sodium chloride 0.9 % 100 mL IVPB     2 g 200 mL/hr over 30 Minutes Intravenous STAT 12/13/18 1055 12/13/18 1207   12/13/18 0600  cefTRIAXone (ROCEPHIN) 2 g in sodium chloride 0.9 % 100 mL IVPB  Status:  Discontinued     2 g 200 mL/hr over 30 Minutes Intravenous Every 12 hours 12/12/18 1715 12/13/18 1042   12/13/18 0200  vancomycin (VANCOCIN) IVPB 750 mg/150 ml premix  Status:  Discontinued     750 mg 150 mL/hr over 60 Minutes Intravenous  Every 8 hours 12/12/18 1740 12/13/18 0930   12/12/18 1745  acyclovir (ZOVIRAX) 635 mg in dextrose 5 % 100 mL IVPB  Status:  Discontinued     10 mg/kg  63.5 kg 112.7 mL/hr over 60 Minutes Intravenous Every 8 hours 12/12/18 1740 12/13/18 1042   12/12/18 1715  vancomycin (VANCOCIN) IVPB 1000 mg/200 mL premix  Status:  Discontinued     1,000 mg 200 mL/hr over 60 Minutes Intravenous  Once 12/12/18 1715 12/12/18 1724   12/12/18 1500  acyclovir (ZOVIRAX) 635 mg in dextrose 5 % 100 mL IVPB     10 mg/kg  63.5 kg 112.7 mL/hr over 60 Minutes Intravenous  Once 12/12/18 1350 12/12/18 1824   12/12/18 1430  vancomycin (VANCOCIN) 1,250 mg in sodium chloride 0.9 % 250 mL IVPB     1,250 mg 166.7 mL/hr over 90 Minutes Intravenous  Once 12/12/18 1350 12/12/18 1657   12/12/18 1400  cefTRIAXone (ROCEPHIN) 2 g in sodium chloride 0.9 % 100 mL IVPB     2 g 200 mL/hr over 30 Minutes Intravenous  Once 12/12/18 1350 12/12/18 1657  DVT prophylaxis: SCD Code Status: fall Family Communication: mother at bedside Disposition Plan  And Barriers for discharge:  Patient is not ready to be discharged.  His infection seems to be not controlled well yet.  CRP is treating up.  Patient still need IV antibiotics.   Consultants:   Neurosurgery  Infectious Disease   Procedures:  Decompressive anterior cervical discectomy, C5-C6.   Anterior cervical arthrodesis C5- C6 utilizing a cortico-cancellus bone graft  Anterior cervical plating C5-C6 utilizing an alphate plate.  Antimicrobials: Anti-infectives (From admission, onward)   Start     Dose/Rate Route Frequency Ordered Stop   12/22/18 1445  ceFEPIme (MAXIPIME) 1 g in sodium chloride 0.9 % 100 mL IVPB  Status:  Discontinued     1 g 200 mL/hr over 30 Minutes Intravenous Every 8 hours 12/22/18 1439 12/22/18 1441   12/22/18 1445  ceFEPIme (MAXIPIME) 2 g in sodium chloride 0.9 % 100 mL IVPB     2 g 200 mL/hr over 30 Minutes Intravenous Every 8 hours  12/22/18 1441     12/17/18 1045  levofloxacin (LEVAQUIN) tablet 750 mg  Status:  Discontinued     750 mg Oral Daily 12/17/18 1040 12/22/18 1439   12/16/18 1230  cefTRIAXone (ROCEPHIN) 2 g in sodium chloride 0.9 % 100 mL IVPB  Status:  Discontinued     2 g 200 mL/hr over 30 Minutes Intravenous Every 24 hours 12/16/18 0922 12/17/18 1040   12/14/18 1028  bacitracin 50,000 Units in sodium chloride 0.9 % 500 mL irrigation  Status:  Discontinued       As needed 12/14/18 1028 12/14/18 1212   12/14/18 0941  ceFAZolin (ANCEF) 2-4 GM/100ML-% IVPB    Note to Pharmacy: Aquilla Hacker   : cabinet override      12/14/18 0941 12/14/18 2144   12/13/18 2000  ceFEPIme (MAXIPIME) 2 g in sodium chloride 0.9 % 100 mL IVPB  Status:  Discontinued     2 g 200 mL/hr over 30 Minutes Intravenous Every 8 hours 12/13/18 1054 12/13/18 1056   12/13/18 2000  ceFEPIme (MAXIPIME) 2 g in sodium chloride 0.9 % 100 mL IVPB  Status:  Discontinued     2 g 200 mL/hr over 30 Minutes Intravenous Every 8 hours 12/13/18 1056 12/16/18 0922   12/13/18 1100  ceFEPIme (MAXIPIME) 2 g in sodium chloride 0.9 % 100 mL IVPB  Status:  Discontinued     2 g 200 mL/hr over 30 Minutes Intravenous STAT 12/13/18 1054 12/13/18 1054   12/13/18 1100  ceFEPIme (MAXIPIME) 2 g in sodium chloride 0.9 % 100 mL IVPB     2 g 200 mL/hr over 30 Minutes Intravenous STAT 12/13/18 1055 12/13/18 1207   12/13/18 0600  cefTRIAXone (ROCEPHIN) 2 g in sodium chloride 0.9 % 100 mL IVPB  Status:  Discontinued     2 g 200 mL/hr over 30 Minutes Intravenous Every 12 hours 12/12/18 1715 12/13/18 1042   12/13/18 0200  vancomycin (VANCOCIN) IVPB 750 mg/150 ml premix  Status:  Discontinued     750 mg 150 mL/hr over 60 Minutes Intravenous Every 8 hours 12/12/18 1740 12/13/18 0930   12/12/18 1745  acyclovir (ZOVIRAX) 635 mg in dextrose 5 % 100 mL IVPB  Status:  Discontinued     10 mg/kg  63.5 kg 112.7 mL/hr over 60 Minutes Intravenous Every 8 hours 12/12/18 1740 12/13/18 1042    12/12/18 1715  vancomycin (VANCOCIN) IVPB 1000 mg/200 mL premix  Status:  Discontinued  1,000 mg 200 mL/hr over 60 Minutes Intravenous  Once 12/12/18 1715 12/12/18 1724   12/12/18 1500  acyclovir (ZOVIRAX) 635 mg in dextrose 5 % 100 mL IVPB     10 mg/kg  63.5 kg 112.7 mL/hr over 60 Minutes Intravenous  Once 12/12/18 1350 12/12/18 1824   12/12/18 1430  vancomycin (VANCOCIN) 1,250 mg in sodium chloride 0.9 % 250 mL IVPB     1,250 mg 166.7 mL/hr over 90 Minutes Intravenous  Once 12/12/18 1350 12/12/18 1657   12/12/18 1400  cefTRIAXone (ROCEPHIN) 2 g in sodium chloride 0.9 % 100 mL IVPB     2 g 200 mL/hr over 30 Minutes Intravenous  Once 12/12/18 1350 12/12/18 1657        Subjective: Patient reports severe neck pain, no fever, chills, denies chest pain, shortness of breath, cough.  No nausea vomiting, diarrhea.  No abdominal pain.  Objective: Vitals:   12/26/18 0500 12/26/18 0700 12/26/18 1607 12/26/18 2131  BP:  (!) 128/109 110/71 123/88  Pulse:  95 97 100  Resp:   20 18  Temp:  (!) 97.4 F (36.3 C) 98.2 F (36.8 C) 98 F (36.7 C)  TempSrc:  Oral Oral Oral  SpO2:  90%  93%  Weight: 69.9 kg     Height:        Intake/Output Summary (Last 24 hours) at 12/26/2018 2151 Last data filed at 12/26/2018 1800 Gross per 24 hour  Intake 2123.94 ml  Output -  Net 2123.94 ml   Filed Weights   12/22/18 0500 12/25/18 0415 12/26/18 0500  Weight: 68 kg 70 kg 69.9 kg    Examination: Physical Exam:  General: Not in acute distress HEENT: PERRL, EOMI, no scleral icterus, No JVD or bruit Cardiac: S1/S2, RRR, No murmurs, gallops or rubs Pulm: Good air movement bilaterally. Clear to auscultation bilaterally. No rales, wheezing, rhonchi or rubs. Abd: Soft, nondistended, nontender, no rebound pain, no organomegaly, BS present Ext: No edema. 2+DP/PT pulse bilaterally Musculoskeletal: No joint deformities, erythema, or stiffness, ROM full Skin: No rashes.  Neuro: Alert and oriented X3,  cranial nerves II-XII grossly intact, muscle strength 5/5 in all extremeties, sensation to light touch intact. Brachial reflex 2+ bilaterally. Knee reflex 1+ bilaterally. Negative Babinski's sign. Normal finger to nose test. Psych: Patient is not psychotic, no suicidal or hemocidal ideation.    Data Reviewed: I have personally reviewed following labs and imaging studies  CBC: Recent Labs  Lab 12/20/18 0745 12/24/18 0352  WBC 8.9 6.4  NEUTROABS 5.5 3.9  HGB 12.5* 11.9*  HCT 39.7 37.1*  MCV 83.2 82.3  PLT 250 737   Basic Metabolic Panel: Recent Labs  Lab 12/20/18 0745 12/24/18 0352  NA 137 138  K 4.2 4.7  CL 100 102  CO2 27 26  GLUCOSE 101* 121*  BUN 23* 24*  CREATININE 0.65 0.60*  CALCIUM 9.9 9.7   GFR: Estimated Creatinine Clearance: 110.4 mL/min (A) (by C-G formula based on SCr of 0.6 mg/dL (L)). Liver Function Tests: Recent Labs  Lab 12/20/18 0745 12/24/18 0352  AST 38 42*  ALT 45* 45*  ALKPHOS 141* 145*  BILITOT 0.3 0.4  PROT 7.6 7.4  ALBUMIN 3.4* 3.2*   No results for input(s): LIPASE, AMYLASE in the last 168 hours. No results for input(s): AMMONIA in the last 168 hours. Coagulation Profile: No results for input(s): INR, PROTIME in the last 168 hours. Cardiac Enzymes: No results for input(s): CKTOTAL, CKMB, CKMBINDEX, TROPONINI in the last 168 hours. BNP (  last 3 results) No results for input(s): PROBNP in the last 8760 hours. HbA1C: No results for input(s): HGBA1C in the last 72 hours. CBG: No results for input(s): GLUCAP in the last 168 hours. Lipid Profile: No results for input(s): CHOL, HDL, LDLCALC, TRIG, CHOLHDL, LDLDIRECT in the last 72 hours. Thyroid Function Tests: No results for input(s): TSH, T4TOTAL, FREET4, T3FREE, THYROIDAB in the last 72 hours. Anemia Panel: No results for input(s): VITAMINB12, FOLATE, FERRITIN, TIBC, IRON, RETICCTPCT in the last 72 hours. Sepsis Labs: No results for input(s): PROCALCITON, LATICACIDVEN in the last  168 hours.  No results found for this or any previous visit (from the past 240 hour(s)).    Radiology Studies: No results found.      Scheduled Meds: . feeding supplement (ENSURE ENLIVE)  237 mL Oral BID BM  . senna  1 tablet Oral BID  . sodium chloride flush  3 mL Intravenous Q12H   Continuous Infusions: . sodium chloride    . ceFEPime (MAXIPIME) IV Stopped (12/26/18 1524)     LOS: 14 days    Time spent: 3835    Lorretta HarpXilin Lonnel Gjerde, DO [facility] PAGER is on AMION  If 7PM-7AM, please contact night-coverage [email or web address] Password [email or web address] 12/26/2018, 9:51 PM

## 2018-12-27 LAB — CBC
HCT: 40 % (ref 39.0–52.0)
Hemoglobin: 13 g/dL (ref 13.0–17.0)
MCH: 26.4 pg (ref 26.0–34.0)
MCHC: 32.5 g/dL (ref 30.0–36.0)
MCV: 81.3 fL (ref 80.0–100.0)
Platelets: 212 10*3/uL (ref 150–400)
RBC: 4.92 MIL/uL (ref 4.22–5.81)
RDW: 19.9 % — ABNORMAL HIGH (ref 11.5–15.5)
WBC: 7.1 10*3/uL (ref 4.0–10.5)
nRBC: 0 % (ref 0.0–0.2)

## 2018-12-27 LAB — BASIC METABOLIC PANEL
Anion gap: 11 (ref 5–15)
BUN: 22 mg/dL — ABNORMAL HIGH (ref 6–20)
CO2: 25 mmol/L (ref 22–32)
Calcium: 9.9 mg/dL (ref 8.9–10.3)
Chloride: 102 mmol/L (ref 98–111)
Creatinine, Ser: 0.55 mg/dL — ABNORMAL LOW (ref 0.61–1.24)
GFR calc Af Amer: >60 mL/min (ref >60)
GFR calc non Af Amer: >60 mL/min (ref >60)
Glucose, Bld: 102 mg/dL — ABNORMAL HIGH (ref 70–99)
Potassium: 4.4 mmol/L (ref 3.5–5.1)
Sodium: 138 mmol/L (ref 135–145)

## 2018-12-27 LAB — C-REACTIVE PROTEIN: CRP: 0.8 mg/dL (ref <1.0)

## 2018-12-27 NOTE — Progress Notes (Signed)
PROGRESS NOTE    David Short  ZOX:[id number] DOB: 05/31/1970 DOA: 12/12/2018 PCP: Default, Provider, MD    Brief Narrative:   48 year old Caucasian male with past medical history significant for IV heroin and cocaine abuse, chronic back pain and prior fractures of left arm and left ankle treated operatively, now presented from jail with complaints of neck and back pain.Underwent CSF examination which showed some pleocytosis, Admitted initially with tentative diagnosis of aseptic meningitis -on IV broad-spectrum IV antibiotics as well as antiviral medication.Blood cultures come back positive for Serratia. MRI brain / MRI C-spine with and without contrast was performed which showed evidence of C5-C6 discitis as well as osteomyelitis with ventral phlegmon/epidural abscess. Patient was transferred to [facility] for further assessment, management and surgery. Patient underwent decompressive anterior cervical discectomy C5-6, anterior cervical arthrodesisC5-6utilizing a cortico-cancellusbone graft and anterior cervical plating C5-6utilizing a Alphatecplateon 12/14/2018 by Neurosurgery.Infectious disease team is directing antibiotics management. Neurosurgery following along.  Interim History: CRP rechecked after 7 days of oral levaquin in lieu of cefepime. It increased from 1.4 to 1.6 on 10/7, The patient was back on IV cefepime --> 0.8.   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 Seen by infectious diseases for follow-up and recommend transition to oral antibiotics at the time of discharge: Potentially Levaquin 750 mg daily until November 22. However, NS has expressed  concerns about failure of this agent for this indication in the past.Neurosurgery following along. CRP rechecked after 7 days of oral levaquin in lieu of cefepime. It increased from 1.4 after admission to 1.6 on 10/7. The patient was back on IV cefepime. Repeated CRP 0.8 -will continue IV cefepime - will need to discuss with ID regarding oral antibiotics before discharge  Neck Pain: The patient is sleeping soundly. Will decrease dose of oxycodone. Monitor. Pt is habituated to opiates due to his heroine abuse, and he has undergone an extensive surgery for his cervical osteomyelitis. This accounts for the slow opiate weaning process. The patient will not be able to receive anything other than Tylenol #3 at the prison. -currently on oxycodone 10 mg every 4 hours  Serratia bacteremia:Blood cultures and wound cultures growing Serratia. Levaquin course as discussed above. However, Neurosurgery has expressed concerns about failure of this agent in treating vertebral osteomyelitis in the past. CRP rechecked after 7 days of oral levaquin in lieu of cefepime. The patient is back on IV cefepime. -The patient is continued on cefepime currently.  Polysubstance abuse: Urine drug screen unremarkable on presentation. Has history of cocaine/heroin abuse.Watch for signs of withdrawal. Currently on opiates but in the setting of postoperative pain-discussed with patient regarding slow taper with nearing discharge plan. He will only be able to receive Tylenol #3 at the prison. He is currently having severe neck pain and Oxycodone has been reduced to10 mg Q4h. This will be a slow process given the source of his pain and his habituation to narcotics.   Mild thrombocytopenia:Present on admission in the setting of C5-C6 osteomyelitis and serratia bacteremia. Now resolved.    Anti-infectives (From admission, onward)   Start     Dose/Rate Route Frequency Ordered Stop   12/22/18 1445  ceFEPIme (MAXIPIME) 1 g  in  sodium chloride 0.9 % 100 mL IVPB  Status:  Discontinued     1 g 200 mL/hr over 30 Minutes Intravenous Every 8 hours 12/22/18 1439 12/22/18 1441   12/22/18 1445  ceFEPIme (MAXIPIME) 2 g in sodium chloride 0.9 % 100 mL IVPB     2 g 200 mL/hr over 30 Minutes Intravenous Every 8 hours 12/22/18 1441     12/17/18 1045  levofloxacin (LEVAQUIN) tablet 750 mg  Status:  Discontinued     750 mg Oral Daily 12/17/18 1040 12/22/18 1439   12/16/18 1230  cefTRIAXone (ROCEPHIN) 2 g in sodium chloride 0.9 % 100 mL IVPB  Status:  Discontinued     2 g 200 mL/hr over 30 Minutes Intravenous Every 24 hours 12/16/18 0922 12/17/18 1040   12/14/18 1028  bacitracin 50,000 Units in sodium chloride 0.9 % 500 mL irrigation  Status:  Discontinued       As needed 12/14/18 1028 12/14/18 1212   12/14/18 0941  ceFAZolin (ANCEF) 2-4 GM/100ML-% IVPB    Note to Pharmacy: Aquilla Hacker   : cabinet override      12/14/18 0941 12/14/18 2144   12/13/18 2000  ceFEPIme (MAXIPIME) 2 g in sodium chloride 0.9 % 100 mL IVPB  Status:  Discontinued     2 g 200 mL/hr over 30 Minutes Intravenous Every 8 hours 12/13/18 1054 12/13/18 1056   12/13/18 2000  ceFEPIme (MAXIPIME) 2 g in sodium chloride 0.9 % 100 mL IVPB  Status:  Discontinued     2 g 200 mL/hr over 30 Minutes Intravenous Every 8 hours 12/13/18 1056 12/16/18 0922   12/13/18 1100  ceFEPIme (MAXIPIME) 2 g in sodium chloride 0.9 % 100 mL IVPB  Status:  Discontinued     2 g 200 mL/hr over 30 Minutes Intravenous STAT 12/13/18 1054 12/13/18 1054   12/13/18 1100  ceFEPIme (MAXIPIME) 2 g in sodium chloride 0.9 % 100 mL IVPB     2 g 200 mL/hr over 30 Minutes Intravenous STAT 12/13/18 1055 12/13/18 1207   12/13/18 0600  cefTRIAXone (ROCEPHIN) 2 g in sodium chloride 0.9 % 100 mL IVPB  Status:  Discontinued     2 g 200 mL/hr over 30 Minutes Intravenous Every 12 hours 12/12/18 1715 12/13/18 1042   12/13/18 0200  vancomycin (VANCOCIN) IVPB 750 mg/150 ml premix  Status:  Discontinued      750 mg 150 mL/hr over 60 Minutes Intravenous Every 8 hours 12/12/18 1740 12/13/18 0930   12/12/18 1745  acyclovir (ZOVIRAX) 635 mg in dextrose 5 % 100 mL IVPB  Status:  Discontinued     10 mg/kg  63.5 kg 112.7 mL/hr over 60 Minutes Intravenous Every 8 hours 12/12/18 1740 12/13/18 1042   12/12/18 1715  vancomycin (VANCOCIN) IVPB 1000 mg/200 mL premix  Status:  Discontinued     1,000 mg 200 mL/hr over 60 Minutes Intravenous  Once 12/12/18 1715 12/12/18 1724   12/12/18 1500  acyclovir (ZOVIRAX) 635 mg in dextrose 5 % 100 mL IVPB     10 mg/kg  63.5 kg 112.7 mL/hr over 60 Minutes Intravenous  Once 12/12/18 1350 12/12/18 1824   12/12/18 1430  vancomycin (VANCOCIN) 1,250 mg in sodium chloride 0.9 % 250 mL IVPB     1,250 mg 166.7 mL/hr over 90 Minutes Intravenous  Once 12/12/18 1350 12/12/18 1657   12/12/18 1400  cefTRIAXone (ROCEPHIN) 2 g in sodium chloride 0.9 % 100 mL IVPB     2 g 200 mL/hr over  30 Minutes Intravenous  Once 12/12/18 1350 12/12/18 1657      DVT prophylaxis: SCD Code Status: fall Family Communication: mother at bedside Disposition Plan  And Barriers for discharge:  Patient is not ready to be discharged. Patient still need IV antibiotics.   Consultants:   Neurosurgery  Infectious Disease   Procedures:  Decompressive anterior cervical discectomy, C5-C6.   Anterior cervical arthrodesis C5- C6 utilizing a cortico-cancellus bone graft  Anterior cervical plating C5-C6 utilizing an alphate plate.  Antimicrobials: Anti-infectives (From admission, onward)   Start     Dose/Rate Route Frequency Ordered Stop   12/22/18 1445  ceFEPIme (MAXIPIME) 1 g in sodium chloride 0.9 % 100 mL IVPB  Status:  Discontinued     1 g 200 mL/hr over 30 Minutes Intravenous Every 8 hours 12/22/18 1439 12/22/18 1441   12/22/18 1445  ceFEPIme (MAXIPIME) 2 g in sodium chloride 0.9 % 100 mL IVPB     2 g 200 mL/hr over 30 Minutes Intravenous Every 8 hours 12/22/18 1441     12/17/18 1045   levofloxacin (LEVAQUIN) tablet 750 mg  Status:  Discontinued     750 mg Oral Daily 12/17/18 1040 12/22/18 1439   12/16/18 1230  cefTRIAXone (ROCEPHIN) 2 g in sodium chloride 0.9 % 100 mL IVPB  Status:  Discontinued     2 g 200 mL/hr over 30 Minutes Intravenous Every 24 hours 12/16/18 0922 12/17/18 1040   12/14/18 1028  bacitracin 50,000 Units in sodium chloride 0.9 % 500 mL irrigation  Status:  Discontinued       As needed 12/14/18 1028 12/14/18 1212   12/14/18 0941  ceFAZolin (ANCEF) 2-4 GM/100ML-% IVPB    Note to Pharmacy: Aquilla Hacker   : cabinet override      12/14/18 0941 12/14/18 2144   12/13/18 2000  ceFEPIme (MAXIPIME) 2 g in sodium chloride 0.9 % 100 mL IVPB  Status:  Discontinued     2 g 200 mL/hr over 30 Minutes Intravenous Every 8 hours 12/13/18 1054 12/13/18 1056   12/13/18 2000  ceFEPIme (MAXIPIME) 2 g in sodium chloride 0.9 % 100 mL IVPB  Status:  Discontinued     2 g 200 mL/hr over 30 Minutes Intravenous Every 8 hours 12/13/18 1056 12/16/18 0922   12/13/18 1100  ceFEPIme (MAXIPIME) 2 g in sodium chloride 0.9 % 100 mL IVPB  Status:  Discontinued     2 g 200 mL/hr over 30 Minutes Intravenous STAT 12/13/18 1054 12/13/18 1054   12/13/18 1100  ceFEPIme (MAXIPIME) 2 g in sodium chloride 0.9 % 100 mL IVPB     2 g 200 mL/hr over 30 Minutes Intravenous STAT 12/13/18 1055 12/13/18 1207   12/13/18 0600  cefTRIAXone (ROCEPHIN) 2 g in sodium chloride 0.9 % 100 mL IVPB  Status:  Discontinued     2 g 200 mL/hr over 30 Minutes Intravenous Every 12 hours 12/12/18 1715 12/13/18 1042   12/13/18 0200  vancomycin (VANCOCIN) IVPB 750 mg/150 ml premix  Status:  Discontinued     750 mg 150 mL/hr over 60 Minutes Intravenous Every 8 hours 12/12/18 1740 12/13/18 0930   12/12/18 1745  acyclovir (ZOVIRAX) 635 mg in dextrose 5 % 100 mL IVPB  Status:  Discontinued     10 mg/kg  63.5 kg 112.7 mL/hr over 60 Minutes Intravenous Every 8 hours 12/12/18 1740 12/13/18 1042   12/12/18 1715  vancomycin  (VANCOCIN) IVPB 1000 mg/200 mL premix  Status:  Discontinued     1,000  mg 200 mL/hr over 60 Minutes Intravenous  Once 12/12/18 1715 12/12/18 1724   12/12/18 1500  acyclovir (ZOVIRAX) 635 mg in dextrose 5 % 100 mL IVPB     10 mg/kg  63.5 kg 112.7 mL/hr over 60 Minutes Intravenous  Once 12/12/18 1350 12/12/18 1824   12/12/18 1430  vancomycin (VANCOCIN) 1,250 mg in sodium chloride 0.9 % 250 mL IVPB     1,250 mg 166.7 mL/hr over 90 Minutes Intravenous  Once 12/12/18 1350 12/12/18 1657   12/12/18 1400  cefTRIAXone (ROCEPHIN) 2 g in sodium chloride 0.9 % 100 mL IVPB     2 g 200 mL/hr over 30 Minutes Intravenous  Once 12/12/18 1350 12/12/18 1657        Subjective: Patient reports severe neck pain, no fever, chills, denies chest pain, shortness of breath, cough.  No nausea vomiting, diarrhea.  No abdominal pain.  Objective: Vitals:   12/26/18 2131 12/26/18 2258 12/26/18 2356 12/27/18 0341  BP: 123/88   (!) 145/99  Pulse: 100 (!) 101 99 (!) 103  Resp: 18  18 20   Temp: 98 F (36.7 C)  98.2 F (36.8 C) 97.8 F (36.6 C)  TempSrc: Oral  Oral Oral  SpO2: 93% 92% 92% 92%  Weight:      Height:        Intake/Output Summary (Last 24 hours) at 12/27/2018 0801 Last data filed at 12/27/2018 0520 Gross per 24 hour  Intake 543 ml  Output -  Net 543 ml   Filed Weights   12/22/18 0500 12/25/18 0415 12/26/18 0500  Weight: 68 kg 70 kg 69.9 kg    Examination: Physical Exam:  General: Not in acute distress HEENT: PERRL, EOMI, no scleral icterus, No JVD or bruit Cardiac: S1/S2, RRR, No murmurs, gallops or rubs Pulm: Clear to auscultation bilaterally. No rales, wheezing, rhonchi or rubs. Abd: Soft, nondistended, nontender, no rebound pain, no organomegaly, BS present Ext: No edema. 2+DP/PT pulse bilaterally Musculoskeletal: No joint deformities, erythema, or stiffness, ROM full Skin: No rashes.  Neuro: Alert and oriented X3, cranial nerves II-XII grossly intact, muscle strength 5/5 in  all extremeties, sensation to light touch intact. Brachial reflex 2+ bilaterally. Knee reflex 1+ bilaterally. Negative Babinski's sign. Normal finger to nose test. Psych: Patient is not psychotic, no suicidal or hemocidal ideation.    Data Reviewed: I have personally reviewed following labs and imaging studies  CBC: Recent Labs  Lab 12/24/18 0352 12/27/18 0531  WBC 6.4 7.1  NEUTROABS 3.9  --   HGB 11.9* 13.0  HCT 37.1* 40.0  MCV 82.3 81.3  PLT 203 008   Basic Metabolic Panel: Recent Labs  Lab 12/24/18 0352 12/27/18 0531  NA 138 138  K 4.7 4.4  CL 102 102  CO2 26 25  GLUCOSE 121* 102*  BUN 24* 22*  CREATININE 0.60* 0.55*  CALCIUM 9.7 9.9   GFR: Estimated Creatinine Clearance: 110.4 mL/min (A) (by C-G formula based on SCr of 0.55 mg/dL (L)). Liver Function Tests: Recent Labs  Lab 12/24/18 0352  AST 42*  ALT 45*  ALKPHOS 145*  BILITOT 0.4  PROT 7.4  ALBUMIN 3.2*   No results for input(s): LIPASE, AMYLASE in the last 168 hours. No results for input(s): AMMONIA in the last 168 hours. Coagulation Profile: No results for input(s): INR, PROTIME in the last 168 hours. Cardiac Enzymes: No results for input(s): CKTOTAL, CKMB, CKMBINDEX, TROPONINI in the last 168 hours. BNP (last 3 results) No results for input(s): PROBNP in the last  8760 hours. HbA1C: No results for input(s): HGBA1C in the last 72 hours. CBG: No results for input(s): GLUCAP in the last 168 hours. Lipid Profile: No results for input(s): CHOL, HDL, LDLCALC, TRIG, CHOLHDL, LDLDIRECT in the last 72 hours. Thyroid Function Tests: No results for input(s): TSH, T4TOTAL, FREET4, T3FREE, THYROIDAB in the last 72 hours. Anemia Panel: No results for input(s): VITAMINB12, FOLATE, FERRITIN, TIBC, IRON, RETICCTPCT in the last 72 hours. Sepsis Labs: No results for input(s): PROCALCITON, LATICACIDVEN in the last 168 hours.  No results found for this or any previous visit (from the past 240 hour(s)).     Radiology Studies: No results found.      Scheduled Meds: . feeding supplement (ENSURE ENLIVE)  237 mL Oral BID BM  . senna  1 tablet Oral BID  . sodium chloride flush  3 mL Intravenous Q12H   Continuous Infusions: . sodium chloride 250 mL (12/26/18 2312)  . ceFEPime (MAXIPIME) IV 2 g (12/27/18 0520)     LOS: 15 days    Time spent: 6435    Lorretta HarpXilin Chandelle Harkey, DO [facility] PAGER is on AMION  If 7PM-7AM, please contact night-coverage [email or web address] Password [email or web address] 12/27/2018, 8:01 AM

## 2018-12-27 NOTE — Progress Notes (Signed)
No new issues or problems.  Pain control still difficult.  Neurologically stable.  Wound clean and dry.  Overall stable.  No new recommendations from our standpoint.

## 2018-12-28 DIAGNOSIS — M4620 Osteomyelitis of vertebra, site unspecified: Secondary | ICD-10-CM

## 2018-12-28 DIAGNOSIS — D696 Thrombocytopenia, unspecified: Secondary | ICD-10-CM

## 2018-12-28 LAB — CBC
HCT: 44.1 % (ref 39.0–52.0)
Hemoglobin: 13.6 g/dL (ref 13.0–17.0)
MCH: 25.5 pg — ABNORMAL LOW (ref 26.0–34.0)
MCHC: 30.8 g/dL (ref 30.0–36.0)
MCV: 82.6 fL (ref 80.0–100.0)
Platelets: 217 10*3/uL (ref 150–400)
RBC: 5.34 MIL/uL (ref 4.22–5.81)
RDW: 19.5 % — ABNORMAL HIGH (ref 11.5–15.5)
WBC: 6.3 10*3/uL (ref 4.0–10.5)
nRBC: 0 % (ref 0.0–0.2)

## 2018-12-28 NOTE — Progress Notes (Signed)
[facility] PROGRESS NOTE  David Short BOF:[id number] DOB: 11-Dec-1970 DOA: 12/12/2018 PCP: Default, Provider, MD  Brief summary   48 year old Caucasian male with past medical history significant for IV heroin and cocaine abuse, chronic back pain and prior fractures of left arm and left ankle treated operatively, now [facility] from jail with complaints of neck and back pain.Underwent CSF examination which showed some pleocytosis, Admitted initially with tentative diagnosis of aseptic meningitis -on IV broad-spectrum IV antibiotics as well as antiviral medication.Blood cultures come back positive for Serratia. MRI brain / MRI C-spine with and without contrast was performed which showed evidence of C5-C6 discitis as well as osteomyelitis with ventral phlegmon/epidural abscess. Patient was transferred to [facility] for further assessment, management and surgery. Patient underwent decompressive anterior cervical discectomy C5-6, anterior cervical arthrodesisC5-6utilizing a cortico-cancellusbone graft and anterior cervical plating C5-6utilizing a Alphatecplateon 12/14/2018 by Neurosurgery.Infectious disease team is directing antibiotics management. Neurosurgery following along.  Interim History: CRP rechecked after 7 days of oral levaquin in lieu of cefepime. It increased from 1.4 to 1.6 on 10/7, The patient was back on IV cefepime --> 0.8.   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 Seen by infectious diseases for follow-up and recommend transition to oral antibiotics at the time of discharge: Potentially Levaquin 750 mg daily until November 22. However, NS has expressed concerns about failure of this agent for this indication in the past.Neurosurgery following along.CRP rechecked after 7 days of oral levaquin in lieu of cefepime. It increased from 1.4 after admission to 1.6 on 10/7. The patient was back on IV  cefepime. Repeated CRP 0.8 -will continue IV cefepime - will need to discuss with ID regarding oral antibiotics next week   Neck Pain: The patient is sleeping soundly. Will decrease dose of oxycodone. Monitor. Pt is habituated to opiates due to his heroine abuse, and he has undergone an extensive surgery for his cervical osteomyelitis. This accounts for the slow opiate weaning process. The patient will not be able to receive anything other than [facility]ol #[facility]. -currently on oxycodone 10 mg every 4 hours  Serratia bacteremia:Blood cultures and wound cultures growing Serratia. Levaquin course as discussed above. However, Neurosurgery has expressed concerns about failure of this agent in treating vertebral osteomyelitis in the past.CRP rechecked after 7 days of oral levaquin in lieu of cefepime. The patient is back on IV cefepime. -The patient is continued on cefepime currently.  Polysubstance abuse:Urine drug screen unremarkable on presentation. Has history of cocaine/heroin abuse.Watch for signs of withdrawal. Currently on opiates but in the setting of postoperative pain-discussed with patient regarding slow taper with nearing discharge plan. He will only be able to receive Tylenol #3 at the prison. He is currently having severe neck pain and Oxycodone has been reduced to10 mg Q4h. This will be a slow process given the source of his pain and his habituation to narcotics.   Mild thrombocytopenia:Present on admission in the setting of C5-C6 osteomyelitis and serratia bacteremia. Now resolved.  Code Status: full Family Communication: d/w patient, RN (indicate person spoken with, relationship, and if by phone, the number) Disposition Plan: remains on iv antibiotics    Consultants:  Neurosurgery  Infectious Disease  Procedures:  Decompressive anterior cervical discectomy, C5-C6.   Anterior cervical arthrodesis C5- C6 utilizing a cortico-cancellus bone graft  Anterior  cervical plating C5-C6 utilizing an alphate plate.  Antibiotics: Anti-infectives (From admission, onward)   Start  Dose/Rate Route Frequency Ordered Stop   12/22/18 1445  ceFEPIme (MAXIPIME) 1 g in sodium chloride 0.9 % 100 mL IVPB  Status:  Discontinued     1 g 200 mL/hr over 30 Minutes Intravenous Every 8 hours 12/22/18 1439 12/22/18 1441   12/22/18 1445  ceFEPIme (MAXIPIME) 2 g in sodium chloride 0.9 % 100 mL IVPB     2 g 200 mL/hr over 30 Minutes Intravenous Every 8 hours 12/22/18 1441     12/17/18 1045  levofloxacin (LEVAQUIN) tablet 750 mg  Status:  Discontinued     750 mg Oral Daily 12/17/18 1040 12/22/18 1439   12/16/18 1230  cefTRIAXone (ROCEPHIN) 2 g in sodium chloride 0.9 % 100 mL IVPB  Status:  Discontinued     2 g 200 mL/hr over 30 Minutes Intravenous Every 24 hours 12/16/18 0922 12/17/18 1040   12/14/18 1028  bacitracin 50,000 Units in sodium chloride 0.9 % 500 mL irrigation  Status:  Discontinued       As needed 12/14/18 1028 12/14/18 1212   12/14/18 0941  ceFAZolin (ANCEF) 2-4 GM/100ML-% IVPB    Note to Pharmacy: Grace Blight   : cabinet override      12/14/18 0941 12/14/18 2144   12/13/18 2000  ceFEPIme (MAXIPIME) 2 g in sodium chloride 0.9 % 100 mL IVPB  Status:  Discontinued     2 g 200 mL/hr over 30 Minutes Intravenous Every 8 hours 12/13/18 1054 12/13/18 1056   12/13/18 2000  ceFEPIme (MAXIPIME) 2 g in sodium chloride 0.9 % 100 mL IVPB  Status:  Discontinued     2 g 200 mL/hr over 30 Minutes Intravenous Every 8 hours 12/13/18 1056 12/16/18 0922   12/13/18 1100  ceFEPIme (MAXIPIME) 2 g in sodium chloride 0.9 % 100 mL IVPB  Status:  Discontinued     2 g 200 mL/hr over 30 Minutes Intravenous STAT 12/13/18 1054 12/13/18 1054   12/13/18 1100  ceFEPIme (MAXIPIME) 2 g in sodium chloride 0.9 % 100 mL IVPB     2 g 200 mL/hr over 30 Minutes Intravenous STAT 12/13/18 1055 12/13/18 1207   12/13/18 0600  cefTRIAXone (ROCEPHIN) 2 g in sodium chloride 0.9 % 100 mL IVPB   Status:  Discontinued     2 g 200 mL/hr over 30 Minutes Intravenous Every 12 hours 12/12/18 1715 12/13/18 1042   12/13/18 0200  vancomycin (VANCOCIN) IVPB 750 mg/150 ml premix  Status:  Discontinued     750 mg 150 mL/hr over 60 Minutes Intravenous Every 8 hours 12/12/18 1740 12/13/18 0930   12/12/18 1745  acyclovir (ZOVIRAX) 635 mg in dextrose 5 % 100 mL IVPB  Status:  Discontinued     10 mg/kg  63.5 kg 112.7 mL/hr over 60 Minutes Intravenous Every 8 hours 12/12/18 1740 12/13/18 1042   12/12/18 1715  vancomycin (VANCOCIN) IVPB 1000 mg/200 mL premix  Status:  Discontinued     1,000 mg 200 mL/hr over 60 Minutes Intravenous  Once 12/12/18 1715 12/12/18 1724   12/12/18 1500  acyclovir (ZOVIRAX) 635 mg in dextrose 5 % 100 mL IVPB     10 mg/kg  63.5 kg 112.7 mL/hr over 60 Minutes Intravenous  Once 12/12/18 1350 12/12/18 1824   12/12/18 1430  vancomycin (VANCOCIN) 1,250 mg in sodium chloride 0.9 % 250 mL IVPB     1,250 mg 166.7 mL/hr over 90 Minutes Intravenous  Once 12/12/18 1350 12/12/18 1657   12/12/18 1400  cefTRIAXone (ROCEPHIN) 2 g in sodium chloride 0.9 %  100 mL IVPB     2 g 200 mL/hr over 30 Minutes Intravenous  Once 12/12/18 1350 12/12/18 1657        (indicate start date, and stop date if known)  HPI/Subjective: No acute distress. Reports chronic pains. No acute changes. No vomiting. Having BM. No blood in stool  -tolerating iv cefepime   Objective: Vitals:   12/28/18 0457 12/28/18 0800  BP:  127/81  Pulse: 97 91  Resp: 18 18  Temp: 97.6 F (36.4 C) 97.9 F (36.6 C)  SpO2:  96%    Intake/Output Summary (Last 24 hours) at 12/28/2018 1118 Last data filed at 12/28/2018 0900 Gross per 24 hour  Intake 360 ml  Output 0 ml  Net 360 ml   Filed Weights   12/25/18 0415 12/26/18 0500 12/28/18 0500  Weight: 70 kg 69.9 kg 67.6 kg    Exam:   General:  No distress   Cardiovascular: s1,s2 rrr  Respiratory: CTA BL  Abdomen: soft, nt   Musculoskeletal: noleg edema     Data Reviewed: Basic Metabolic Panel: Recent Labs  Lab 12/24/18 0352 12/27/18 0531  NA 138 138  K 4.7 4.4  CL 102 102  CO2 26 25  GLUCOSE 121* 102*  BUN 24* 22*  CREATININE 0.60* 0.55*  CALCIUM 9.7 9.9   Liver Function Tests: Recent Labs  Lab 12/24/18 0352  AST 42*  ALT 45*  ALKPHOS 145*  BILITOT 0.4  PROT 7.4  ALBUMIN 3.2*   No results for input(s): LIPASE, AMYLASE in the last 168 hours. No results for input(s): AMMONIA in the last 168 hours. CBC: Recent Labs  Lab 12/24/18 0352 12/27/18 0531 12/28/18 0429  WBC 6.4 7.1 6.3  NEUTROABS 3.9  --   --   HGB 11.9* 13.0 13.6  HCT 37.1* 40.0 44.1  MCV 82.3 81.3 82.6  PLT 203 212 217   Cardiac Enzymes: No results for input(s): CKTOTAL, CKMB, CKMBINDEX, TROPONINI in the last 168 hours. BNP (last 3 results) No results for input(s): BNP in the last 8760 hours.  ProBNP (last 3 results) No results for input(s): PROBNP in the last 8760 hours.  CBG: No results for input(s): GLUCAP in the last 168 hours.  No results found for this or any previous visit (from the past 240 hour(s)).   Studies: No results found.  Scheduled Meds: . feeding supplement (ENSURE ENLIVE)  237 mL Oral BID BM  . senna  1 tablet Oral BID  . sodium chloride flush  3 mL Intravenous Q12H   Continuous Infusions: . sodium chloride Stopped (12/27/18 0839)  . ceFEPime (MAXIPIME) IV 2 g (12/28/18 0531)    Principal Problem:   Vertebral osteomyelitis, acute-C5-C6 Active Problems:   Accelerated hypertension   Sinus tachycardia   mild Thrombocytopenia (HCC)   S/P cervical spinal fusion   Neck pain   Malnutrition of moderate degree    Time spent: >25 minutes     Esperanza SheetsBURIEV, Sumit Branham N  [facility] Pager [phone]. If 7PM-7AM, please contact night-coverage at [email or web address], password [email or web address] 12/28/2018, 11:18 AM  LOS: 16 days

## 2018-12-28 NOTE — Progress Notes (Signed)
Patient ID: David Short, male   DOB: February 06, 1971, 48 y.o.   MRN: [id number] BP 127/81 (BP Location: Right Arm)   Pulse 91   Temp 97.9 F (36.6 C) (Oral)   Resp 18   Ht 5\' 10"  (1.778 m)   Wt 67.6 kg   SpO2 96%   BMI 21.38 kg/m  Alert and oriented x 4 Moving all extremities Stable exam

## 2018-12-29 DIAGNOSIS — M549 Dorsalgia, unspecified: Secondary | ICD-10-CM

## 2018-12-29 LAB — CBC
HCT: 39.7 % (ref 39.0–52.0)
Hemoglobin: 12.9 g/dL — ABNORMAL LOW (ref 13.0–17.0)
MCH: 26.3 pg (ref 26.0–34.0)
MCHC: 32.5 g/dL (ref 30.0–36.0)
MCV: 80.9 fL (ref 80.0–100.0)
Platelets: 197 10*3/uL (ref 150–400)
RBC: 4.91 MIL/uL (ref 4.22–5.81)
RDW: 19.4 % — ABNORMAL HIGH (ref 11.5–15.5)
WBC: 6.6 10*3/uL (ref 4.0–10.5)
nRBC: 0 % (ref 0.0–0.2)

## 2018-12-29 MED ORDER — LEVOFLOXACIN 750 MG PO TABS: 750.0000 mg | ORAL_TABLET | Freq: Every day | ORAL | 0 refills | Status: AC

## 2018-12-29 MED ORDER — CIPROFLOXACIN HCL 750 MG PO TABS: 750.0000 mg | ORAL_TABLET | Freq: Two times a day (BID) | ORAL | 0 refills | Status: DC

## 2018-12-29 MED ORDER — OXYCODONE HCL 5 MG PO TABS
10.0000 mg | ORAL_TABLET | Freq: Three times a day (TID) | ORAL | Status: DC | PRN
  Administered 2018-12-29 – 2018-12-30 (×3): 10 mg via ORAL
  Filled 2018-12-29 (×3): qty 2

## 2018-12-29 MED FILL — levoFLOXacin 750 MG TABS: 750 | 40 days supply | Qty: 40 | Fill #0

## 2018-12-29 NOTE — Social Work (Signed)
CSW acknowledging consult for "Billing- Pt has questions about billing d/t being admitted under police custody." CSW/TOC dept does not manage billing- would not be able to provide guidance at this time. When pt receives bill there will be a contact number for pt to speak with hospital billing about cost and coverage.  CSW signing off. Please consult if any additional needs arise.  [place], [place] Work [phone]

## 2018-12-29 NOTE — Progress Notes (Signed)
[facility] PROGRESS NOTE  David Short DOB: 08/04/1970 DOA: 12/12/2018 PCP: Default, Provider, MD  Brief summary   48 year old Caucasian male with past medical history significant for IV heroin and cocaine abuse, chronic back pain and prior fractures of left arm and left ankle treated operatively, now presented from jail with complaints of neck and back pain.Underwent CSF examination which showed some pleocytosis, Admitted initially with tentative diagnosis of aseptic meningitis -on IV broad-spectrum IV antibiotics as well as antiviral medication.Blood cultures come back positive for Serratia. MRI brain / MRI C-spine with and without contrast was performed which showed evidence of C5-C6 discitis as well as osteomyelitis with ventral phlegmon/epidural abscess. [facility], management and surgery. Patient underwent decompressive anterior cervical discectomy C5-6, anterior cervical arthrodesisC5-6utilizing a cortico-cancellusbone graft and anterior cervical plating C5-6utilizing a Alphatecplateon 12/14/2018 by Neurosurgery. Subsequently his antibiotics were switched to oral Levaquin per ID recommendations however neurosurgery was not convinced and they thought that this was too early to switch.  Patient CRP was checked which was only minimally elevated so patient's antibiotics were switched back to IV cefepime about a week ago.  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 Seen by infectious diseases for follow-up and recommend transition to oral antibiotics at the time of discharge: Potentially Levaquin 750 mg daily until November 22. However, NS had expressed concerns about failure of this agent for this indication in the past.Neurosurgery following along.CRP rechecked after 7 days of oral levaquin in lieu of cefepime. It increased  from 1.4 after admission to 1.6 on 10/7. The patient was  placed back on IV cefepime. Repeated CRP 0.8.  I reconsulted ID today and they recommended switching to oral Levaquin at the time of discharge.  Patient is still has neck pain and he is using oxycodone 10 mg every 4 hours.  In order to prepare him for discharge, I will de-escalate it and place it every 8 hours.  Will wait for neurosurgery to see him.  Hopefully he will be discharged tomorrow.  He tells me that he has been released/dismissed by the court so he is not going back to jail.  If needed, I will prescribe him a few doses of oxycodone to go home with.  He has follow-up appointment to see ID as an outpatient.  We will continue cefepime while he is inpatient.  Polysubstance abuse:Urine drug screen unremarkable on presentation. Has history of cocaine/heroin abuse.he has been in the jail for past 1 month.  His UDS this time is negative.   Mild thrombocytopenia:Present on admission in the setting of C5-C6 osteomyelitis and serratia bacteremia. Now resolved.  Code Status: full Family Communication: Discussed with patient.  All questions answered. Disposition Plan: Potential discharge home tomorrow.   Consultants:  Neurosurgery  Infectious Disease  Procedures:  Decompressive anterior cervical discectomy, C5-C6.   Anterior cervical arthrodesis C5- C6 utilizing a cortico-cancellus bone graft  Anterior cervical plating C5-C6 utilizing an alphate plate.  Antibiotics: Anti-infectives (From admission, onward)   Start     Dose/Rate Route Frequency Ordered Stop   12/22/18 1445  ceFEPIme (MAXIPIME) 1 g in sodium chloride 0.9 % 100 mL IVPB  Status:  Discontinued     1 g 200 mL/hr over 30 Minutes Intravenous Every 8 hours 12/22/18 1439 12/22/18 1441   12/22/18 1445  ceFEPIme (MAXIPIME) 2 g in sodium chloride 0.9 % 100 mL IVPB     2  g 200 mL/hr over 30 Minutes Intravenous Every 8 hours 12/22/18 1441     12/17/18 1045   levofloxacin (LEVAQUIN) tablet 750 mg  Status:  Discontinued     750 mg Oral Daily 12/17/18 1040 12/22/18 1439   12/16/18 1230  cefTRIAXone (ROCEPHIN) 2 g in sodium chloride 0.9 % 100 mL IVPB  Status:  Discontinued     2 g 200 mL/hr over 30 Minutes Intravenous Every 24 hours 12/16/18 0922 12/17/18 1040   12/14/18 1028  bacitracin 50,000 Units in sodium chloride 0.9 % 500 mL irrigation  Status:  Discontinued       As needed 12/14/18 1028 12/14/18 1212   12/14/18 0941  ceFAZolin (ANCEF) 2-4 GM/100ML-% IVPB    Note to Pharmacy: Aquilla Hacker   : cabinet override      12/14/18 0941 12/14/18 2144   12/13/18 2000  ceFEPIme (MAXIPIME) 2 g in sodium chloride 0.9 % 100 mL IVPB  Status:  Discontinued     2 g 200 mL/hr over 30 Minutes Intravenous Every 8 hours 12/13/18 1054 12/13/18 1056   12/13/18 2000  ceFEPIme (MAXIPIME) 2 g in sodium chloride 0.9 % 100 mL IVPB  Status:  Discontinued     2 g 200 mL/hr over 30 Minutes Intravenous Every 8 hours 12/13/18 1056 12/16/18 0922   12/13/18 1100  ceFEPIme (MAXIPIME) 2 g in sodium chloride 0.9 % 100 mL IVPB  Status:  Discontinued     2 g 200 mL/hr over 30 Minutes Intravenous STAT 12/13/18 1054 12/13/18 1054   12/13/18 1100  ceFEPIme (MAXIPIME) 2 g in sodium chloride 0.9 % 100 mL IVPB     2 g 200 mL/hr over 30 Minutes Intravenous STAT 12/13/18 1055 12/13/18 1207   12/13/18 0600  cefTRIAXone (ROCEPHIN) 2 g in sodium chloride 0.9 % 100 mL IVPB  Status:  Discontinued     2 g 200 mL/hr over 30 Minutes Intravenous Every 12 hours 12/12/18 1715 12/13/18 1042   12/13/18 0200  vancomycin (VANCOCIN) IVPB 750 mg/150 ml premix  Status:  Discontinued     750 mg 150 mL/hr over 60 Minutes Intravenous Every 8 hours 12/12/18 1740 12/13/18 0930   12/12/18 1745  acyclovir (ZOVIRAX) 635 mg in dextrose 5 % 100 mL IVPB  Status:  Discontinued     10 mg/kg  63.5 kg 112.7 mL/hr over 60 Minutes Intravenous Every 8 hours 12/12/18 1740 12/13/18 1042   12/12/18 1715  vancomycin  (VANCOCIN) IVPB 1000 mg/200 mL premix  Status:  Discontinued     1,000 mg 200 mL/hr over 60 Minutes Intravenous  Once 12/12/18 1715 12/12/18 1724   12/12/18 1500  acyclovir (ZOVIRAX) 635 mg in dextrose 5 % 100 mL IVPB     10 mg/kg  63.5 kg 112.7 mL/hr over 60 Minutes Intravenous  Once 12/12/18 1350 12/12/18 1824   12/12/18 1430  vancomycin (VANCOCIN) 1,250 mg in sodium chloride 0.9 % 250 mL IVPB     1,250 mg 166.7 mL/hr over 90 Minutes Intravenous  Once 12/12/18 1350 12/12/18 1657   12/12/18 1400  cefTRIAXone (ROCEPHIN) 2 g in sodium chloride 0.9 % 100 mL IVPB     2 g 200 mL/hr over 30 Minutes Intravenous  Once 12/12/18 1350 12/12/18 1657       (indicate start date, and stop date if known)  HPI/Subjective: Patient seen and examined.  Complains of neck pain but that is improving.  No other complaint.  No neurological deficit.  Objective: Vitals:   12/29/18 0747  12/29/18 1157  BP: 126/87 116/84  Pulse: 93 97  Resp: 20 18  Temp: 97.9 F (36.6 C) 97.8 F (36.6 C)  SpO2: 93% 94%    Intake/Output Summary (Last 24 hours) at 12/29/2018 1412 Last data filed at 12/29/2018 0900 Gross per 24 hour  Intake 240 ml  Output 0 ml  Net 240 ml   Filed Weights   12/25/18 0415 12/26/18 0500 12/28/18 0500  Weight: 70 kg 69.9 kg 67.6 kg    Exam:   General exam: Appears calm and comfortable   Respiratory system: Clear to auscultation. Respiratory effort normal.  Cardiovascular system: S1 & S2 heard, RRR. No JVD, murmurs, rubs, gallops or clicks. No pedal edema.  Gastrointestinal system: Abdomen is nondistended, soft and nontender. No organomegaly or masses felt. Normal bowel sounds heard.  Central nervous system: Alert and oriented. No focal neurological deficits.  Extremities: Symmetric 5 x 5 power.  Skin: No rashes, lesions or ulcers.   Psychiatry: Judgement and insight appear normal. Mood & affect appropriate.     Data Reviewed: Basic Metabolic Panel: Recent Labs  Lab  12/24/18 0352 12/27/18 0531  NA 138 138  K 4.7 4.4  CL 102 102  CO2 26 25  GLUCOSE 121* 102*  BUN 24* 22*  CREATININE 0.60* 0.55*  CALCIUM 9.7 9.9   Liver Function Tests: Recent Labs  Lab 12/24/18 0352  AST 42*  ALT 45*  ALKPHOS 145*  BILITOT 0.4  PROT 7.4  ALBUMIN 3.2*   No results for input(s): LIPASE, AMYLASE in the last 168 hours. No results for input(s): AMMONIA in the last 168 hours. CBC: Recent Labs  Lab 12/24/18 0352 12/27/18 0531 12/28/18 0429 12/29/18 0458  WBC 6.4 7.1 6.3 6.6  NEUTROABS 3.9  --   --   --   HGB 11.9* 13.0 13.6 12.9*  HCT 37.1* 40.0 44.1 39.7  MCV 82.3 81.3 82.6 80.9  PLT 203 212 217 197   Cardiac Enzymes: No results for input(s): CKTOTAL, CKMB, CKMBINDEX, TROPONINI in the last 168 hours. BNP (last 3 results) No results for input(s): BNP in the last 8760 hours.  ProBNP (last 3 results) No results for input(s): PROBNP in the last 8760 hours.  CBG: No results for input(s): GLUCAP in the last 168 hours.  No results found for this or any previous visit (from the past 240 hour(s)).   Studies: No results found.  Scheduled Meds: . feeding supplement (ENSURE ENLIVE)  237 mL Oral BID BM  . senna  1 tablet Oral BID  . sodium chloride flush  3 mL Intravenous Q12H   Continuous Infusions: . sodium chloride Stopped (12/27/18 0839)  . ceFEPime (MAXIPIME) IV 2 g (12/29/18 0540)    Principal Problem:   Vertebral osteomyelitis, acute-C5-C6 Active Problems:   Accelerated hypertension   Sinus tachycardia   mild Thrombocytopenia (HCC)   S/P cervical spinal fusion   Neck pain   Malnutrition of moderate degree  Time spent: 35 minutes  [place]  [facility] If 7PM-7AM, please contact night-coverage at [email or web address], password [email or web address] 12/29/2018, 2:12 PM  LOS: 17 days

## 2018-12-29 NOTE — Progress Notes (Signed)
Subjective: No new complaints  Antibiotics:  Anti-infectives (From admission, onward)   Start     Dose/Rate Route Frequency Ordered Stop   12/22/18 1445  ceFEPIme (MAXIPIME) 1 g in sodium chloride 0.9 % 100 mL IVPB  Status:  Discontinued     1 g 200 mL/hr over 30 Minutes Intravenous Every 8 hours 12/22/18 1439 12/22/18 1441   12/22/18 1445  ceFEPIme (MAXIPIME) 2 g in sodium chloride 0.9 % 100 mL IVPB     2 g 200 mL/hr over 30 Minutes Intravenous Every 8 hours 12/22/18 1441     12/17/18 1045  levofloxacin (LEVAQUIN) tablet 750 mg  Status:  Discontinued     750 mg Oral Daily 12/17/18 1040 12/22/18 1439   12/16/18 1230  cefTRIAXone (ROCEPHIN) 2 g in sodium chloride 0.9 % 100 mL IVPB  Status:  Discontinued     2 g 200 mL/hr over 30 Minutes Intravenous Every 24 hours 12/16/18 0922 12/17/18 1040   12/14/18 1028  bacitracin 50,000 Units in sodium chloride 0.9 % 500 mL irrigation  Status:  Discontinued       As needed 12/14/18 1028 12/14/18 1212   12/14/18 0941  ceFAZolin (ANCEF) 2-4 GM/100ML-% IVPB    Note to Pharmacy: Aquilla HackerWalton, Susan   : cabinet override      12/14/18 0941 12/14/18 2144   12/13/18 2000  ceFEPIme (MAXIPIME) 2 g in sodium chloride 0.9 % 100 mL IVPB  Status:  Discontinued     2 g 200 mL/hr over 30 Minutes Intravenous Every 8 hours 12/13/18 1054 12/13/18 1056   12/13/18 2000  ceFEPIme (MAXIPIME) 2 g in sodium chloride 0.9 % 100 mL IVPB  Status:  Discontinued     2 g 200 mL/hr over 30 Minutes Intravenous Every 8 hours 12/13/18 1056 12/16/18 0922   12/13/18 1100  ceFEPIme (MAXIPIME) 2 g in sodium chloride 0.9 % 100 mL IVPB  Status:  Discontinued     2 g 200 mL/hr over 30 Minutes Intravenous STAT 12/13/18 1054 12/13/18 1054   12/13/18 1100  ceFEPIme (MAXIPIME) 2 g in sodium chloride 0.9 % 100 mL IVPB     2 g 200 mL/hr over 30 Minutes Intravenous STAT 12/13/18 1055 12/13/18 1207   12/13/18 0600  cefTRIAXone (ROCEPHIN) 2 g in sodium chloride 0.9 % 100 mL IVPB  Status:   Discontinued     2 g 200 mL/hr over 30 Minutes Intravenous Every 12 hours 12/12/18 1715 12/13/18 1042   12/13/18 0200  vancomycin (VANCOCIN) IVPB 750 mg/150 ml premix  Status:  Discontinued     750 mg 150 mL/hr over 60 Minutes Intravenous Every 8 hours 12/12/18 1740 12/13/18 0930   12/12/18 1745  acyclovir (ZOVIRAX) 635 mg in dextrose 5 % 100 mL IVPB  Status:  Discontinued     10 mg/kg  63.5 kg 112.7 mL/hr over 60 Minutes Intravenous Every 8 hours 12/12/18 1740 12/13/18 1042   12/12/18 1715  vancomycin (VANCOCIN) IVPB 1000 mg/200 mL premix  Status:  Discontinued     1,000 mg 200 mL/hr over 60 Minutes Intravenous  Once 12/12/18 1715 12/12/18 1724   12/12/18 1500  acyclovir (ZOVIRAX) 635 mg in dextrose 5 % 100 mL IVPB     10 mg/kg  63.5 kg 112.7 mL/hr over 60 Minutes Intravenous  Once 12/12/18 1350 12/12/18 1824   12/12/18 1430  vancomycin (VANCOCIN) 1,250 mg in sodium chloride 0.9 % 250 mL IVPB     1,250 mg 166.7  mL/hr over 90 Minutes Intravenous  Once 12/12/18 1350 12/12/18 1657   12/12/18 1400  cefTRIAXone (ROCEPHIN) 2 g in sodium chloride 0.9 % 100 mL IVPB     2 g 200 mL/hr over 30 Minutes Intravenous  Once 12/12/18 1350 12/12/18 1657     Medications: Scheduled Meds:  feeding supplement (ENSURE ENLIVE)  237 mL Oral BID BM   senna  1 tablet Oral BID   sodium chloride flush  3 mL Intravenous Q12H   Continuous Infusions:  sodium chloride Stopped (12/27/18 0839)   ceFEPime (MAXIPIME) IV 2 g (12/29/18 0540)   PRN Meds:.calcium carbonate, diphenhydrAMINE, labetalol, menthol-cetylpyridinium **OR** phenol, methocarbamol **OR** [DISCONTINUED] methocarbamol (ROBAXIN) IV, ondansetron **OR** ondansetron (ZOFRAN) IV, oxyCODONE, senna-docusate, sodium chloride flush, sodium chloride flush  Objective:  Intake/Output Summary (Last 24 hours) at 12/29/2018 1302 Last data filed at 12/29/2018 0900 Gross per 24 hour  Intake 240 ml  Output 0 ml  Net 240 ml   Blood pressure 116/84,  pulse 97, temperature 97.8 F (36.6 C), temperature source Oral, resp. rate 18, height 5\' 10"  (1.778 m), weight 67.6 kg, SpO2 94 %. Temp:  [97.7 F (36.5 C)-98.4 F (36.9 C)] 97.8 F (36.6 C) (10/12 1157) Pulse Rate:  [93-108] 97 (10/12 1157) Resp:  [15-20] 18 (10/12 1157) BP: (116-131)/(84-96) 116/84 (10/12 1157) SpO2:  [91 %-94 %] 94 % (10/12 1157)  Physical Exam: General: Alert and awake, oriented x3, not in any acute distress, cervical collar in place HEENT: anicteric sclera, EOMI CVS: regular rate, normal  Chest: no wheezing, no respiratory distress Abdomen: soft non-distended Extremities: no edema or deformity noted bilaterally Skin: no rashes Neuro: nonfocal, moving all four extremities spontaneously  CBC: CBC Latest Ref Rng & Units 12/29/2018 12/28/2018 12/27/2018  WBC 4.0 - 10.5 K/uL 6.6 6.3 7.1  Hemoglobin 13.0 - 17.0 g/dL 12.9(L) 13.6 13.0  Hematocrit 39.0 - 52.0 % 39.7 44.1 40.0  Platelets 150 - 400 K/uL 197 217 212   BMET Recent Labs    12/27/18 0531  NA 138  K 4.4  CL 102  CO2 25  GLUCOSE 102*  BUN 22*  CREATININE 0.55*  CALCIUM 9.9   Liver Panel  No results for input(s): PROT, ALBUMIN, AST, ALT, ALKPHOS, BILITOT, BILIDIR, IBILI in the last 72 hours.  Sedimentation Rate No results for input(s): ESRSEDRATE in the last 72 hours. C-Reactive Protein Recent Labs    12/27/18 0531  CRP 0.8    Micro Results: Recent Results (from the past 720 hour(s))  Blood culture (routine x 2)     Status: Abnormal   Collection Time: 12/12/18  9:20 AM   Specimen: BLOOD  Result Value Ref Range Status   Specimen Description   Final    BLOOD LEFT ANTECUBITAL Performed at [facility] [address], [place], [place] [address]    Special Requests   Final    BOTTLES DRAWN AEROBIC AND ANAEROBIC Blood Culture results may not be optimal due to an excessive volume of blood received in culture bottles Performed at [facility] [address] [place], [place] [address]    Culture  Setup Time   Final    GRAM NEGATIVE RODS IN BOTH AEROBIC AND ANAEROBIC BOTTLES CRITICAL RESULT CALLED TO, READ BACK BY AND VERIFIED WITH: [place] [phone] FCP Performed at [facility], [place] [address], [place], [place] [address]    Culture SERRATIA MARCESCENS (A)  Final   Report Status 12/17/2018 FINAL  Final  Organism ID, Bacteria SERRATIA MARCESCENS  Final      Susceptibility   Serratia marcescens - MIC*    CEFAZOLIN >=64 RESISTANT Resistant     CEFEPIME <=1 SENSITIVE Sensitive     CEFTAZIDIME <=1 SENSITIVE Sensitive     CEFTRIAXONE <=1 SENSITIVE Sensitive     CIPROFLOXACIN <=0.25 SENSITIVE Sensitive     GENTAMICIN <=1 SENSITIVE Sensitive     TRIMETH/SULFA <=20 SENSITIVE Sensitive     * SERRATIA MARCESCENS  Blood culture (routine x 2)     Status: None   Collection Time: 12/12/18  9:20 AM   Specimen: BLOOD LEFT HAND  Result Value Ref Range Status   Specimen Description   Final    BLOOD LEFT HAND Performed at [facility], [address], [place], [place] [address]    Special Requests   Final    BOTTLES DRAWN AEROBIC ONLY Blood Culture results may not be optimal due to an inadequate volume of blood received in culture bottles Performed at [facility], [address] [place], [place] [address]    Culture   Final    NO GROWTH 5 DAYS Performed at [facility], [address], [place], [place] [address]    Report Status 12/17/2018 FINAL  Final  SARS CORONAVIRUS 2 (TAT 6-24 HRS) Nasopharyngeal Nasopharyngeal Swab     Status: None   Collection Time: 12/12/18  9:20 AM   Specimen: Nasopharyngeal Swab  Result Value Ref Range Status   SARS Coronavirus 2 NEGATIVE NEGATIVE Final    Comment: (NOTE) SARS-CoV-2 target nucleic acids are NOT DETECTED. The SARS-CoV-2 RNA is generally detectable in upper and lower respiratory specimens during the acute phase of infection.  Negative results do not preclude SARS-CoV-2 infection, do not rule out co-infections with other pathogens, and should not be used as the sole basis for treatment or other patient management decisions. Negative results must be combined with clinical observations, patient history, and epidemiological information. The expected result is Negative. Fact Sheet for Patients: HairSlick.no Fact Sheet for Healthcare Providers: quierodirigir.com This test is not yet approved or cleared by the [place] FDA and  has been authorized for detection and/or diagnosis of SARS-CoV-2 by FDA under an Emergency Use Authorization (EUA). This EUA will remain  in effect (meaning this test can be used) for the duration of the COVID-19 declaration under Section 56 4(b)(1) of the Act, 21 U.S.C. section 360bbb-3(b)(1), unless the authorization is terminated or revoked sooner. Performed at [facility], [address], [place], [place] [address]   Blood Culture ID Panel (Reflexed)     Status: Abnormal   Collection Time: 12/12/18  9:20 AM  Result Value Ref Range Status   Enterococcus species NOT DETECTED NOT DETECTED Final   Listeria monocytogenes NOT DETECTED NOT DETECTED Final   Staphylococcus species NOT DETECTED NOT DETECTED Final   Staphylococcus aureus (BCID) NOT DETECTED NOT DETECTED Final   Streptococcus species NOT DETECTED NOT DETECTED Final   Streptococcus agalactiae NOT DETECTED NOT DETECTED Final   Streptococcus pneumoniae NOT DETECTED NOT DETECTED Final   Streptococcus pyogenes NOT DETECTED NOT DETECTED Final   Acinetobacter baumannii NOT DETECTED NOT DETECTED Final   Enterobacteriaceae species DETECTED (A) NOT DETECTED Final    Comment: Enterobacteriaceae represent a large family of gram-negative bacteria, not a single organism. CRITICAL RESULT CALLED TO, READ BACK BY AND VERIFIED WITH: PHARMD Janine Limbo [phone] FCP     Enterobacter cloacae complex NOT DETECTED NOT DETECTED  Final   Escherichia coli NOT DETECTED NOT DETECTED Final   Klebsiella oxytoca NOT DETECTED NOT DETECTED Final   Klebsiella pneumoniae NOT DETECTED NOT DETECTED Final   Proteus species NOT DETECTED NOT DETECTED Final   Serratia marcescens DETECTED (A) NOT DETECTED Final    Comment: CRITICAL RESULT CALLED TO, READ BACK BY AND VERIFIED WITH: PHARMD Janine Limbo [phone] FCP    Carbapenem resistance NOT DETECTED NOT DETECTED Final   Haemophilus influenzae NOT DETECTED NOT DETECTED Final   Neisseria meningitidis NOT DETECTED NOT DETECTED Final   Pseudomonas aeruginosa NOT DETECTED NOT DETECTED Final   Candida albicans NOT DETECTED NOT DETECTED Final   Candida glabrata NOT DETECTED NOT DETECTED Final   Candida krusei NOT DETECTED NOT DETECTED Final   Candida parapsilosis NOT DETECTED NOT DETECTED Final   Candida tropicalis NOT DETECTED NOT DETECTED Final    Comment: Performed at [facility], [address], [place], [place] [address]  CSF culture     Status: None   Collection Time: 12/12/18 12:21 PM   Specimen: Back; Cerebrospinal Fluid  Result Value Ref Range Status   Specimen Description   Final    BACK CSF Performed at [facility], [address], [place], [place] [address]    Special Requests   Final    NONE Performed at [facility], [address], [place], [place] [address]    Gram Stain   Final    WBC PRESENT, PREDOMINANTLY MONONUCLEAR NO ORGANISMS SEEN CYTOSPIN SMEAR Gram Stain Report Called to,Read Back By and Verified With: CALLED TO Katha Cabal, RN ON 12/12/18 @ 1317 BY LE Performed at [facility], [address], [place], [place] [address]    Culture   Final    NO GROWTH 3 DAYS Performed at [facility], [address], [place], [place] [address]    Report Status 12/16/2018 FINAL  Final  MRSA PCR Screening     Status: None   Collection Time:  12/12/18  9:23 PM   Specimen: Nasal Mucosa; Nasopharyngeal  Result Value Ref Range Status   MRSA by PCR NEGATIVE NEGATIVE Final    Comment:        The GeneXpert MRSA Assay (FDA approved for NASAL specimens only), is one component of a comprehensive MRSA colonization surveillance program. It is not intended to diagnose MRSA infection nor to guide or monitor treatment for MRSA infections. Performed at [facility], [address] [place], [place] [address]   Culture, blood (routine x 2)     Status: None   Collection Time: 12/13/18 11:11 AM   Specimen: BLOOD  Result Value Ref Range Status   Specimen Description   Final    BLOOD RIGHT ARM Performed at [facility], [address], [place], [place] [address]    Special Requests   Final    BOTTLES DRAWN AEROBIC AND ANAEROBIC Blood Culture adequate volume Performed at [facility], [address], [place], [place] [address]    Culture   Final    NO GROWTH 5 DAYS Performed at [facility], [address], [place], [place] [address]    Report Status 12/18/2018 FINAL  Final  Culture, blood (routine x 2)     Status: None   Collection Time: 12/13/18 11:28 AM   Specimen: BLOOD LEFT HAND  Result Value Ref Range Status   Specimen Description   Final    BLOOD  LEFT HAND Blood Culture adequate volume Performed at [facility], [address], [place], [place] [address]    Special Requests   Final    NONE Performed at [facility], [address], [place], [place] [address]    Culture   Final    NO GROWTH 5 DAYS Performed at [facility], [address], [place], [place] [address]    Report Status 12/18/2018 FINAL  Final  Aerobic/Anaerobic Culture (surgical/deep wound)     Status: None   Collection Time: 12/14/18 11:11 AM   Specimen: Wound; Tissue  Result Value Ref Range Status   Specimen Description WOUND ANTERIOR CERVICAL  SIX CORPECTOMY FUSION  Final   Special Requests A  Final   Gram Stain   Final    NO WBC SEEN NO ORGANISMS SEEN Performed at [facility], [address], [place], [place] [address]    Culture   Final    FEW SERRATIA MARCESCENS NO ANAEROBES ISOLATED; CULTURE IN PROGRESS FOR 5 DAYS    Report Status 12/19/2018 FINAL  Final   Organism ID, Bacteria SERRATIA MARCESCENS  Final      Susceptibility   Serratia marcescens - MIC*    CEFAZOLIN >=64 RESISTANT Resistant     CEFEPIME <=1 SENSITIVE Sensitive     CEFTAZIDIME <=1 SENSITIVE Sensitive     CEFTRIAXONE <=1 SENSITIVE Sensitive     CIPROFLOXACIN <=0.25 SENSITIVE Sensitive     GENTAMICIN <=1 SENSITIVE Sensitive     TRIMETH/SULFA <=20 SENSITIVE Sensitive     * FEW SERRATIA MARCESCENS    Studies/Results: No results found.    Assessment/Plan:  INTERVAL HISTORY:  Pt remains afebrile. Antibiotics switched from IV ceftriaxone to oral levofloxacin on 9/30. On levofloxacin for 6 days and then switched to IV cefepime on 10/5 due to neurosurgery's concerns about treatment failure.   Principal Problem:   Vertebral osteomyelitis, acute-C5-C6 Active Problems:   Accelerated hypertension   Sinus tachycardia   mild Thrombocytopenia (HCC)   S/P cervical spinal fusion   Neck pain   Malnutrition of moderate degree    David Short is a 48 y.o. male with significant PMH of chronic back pain who presented with new neck pain, fever, and found on MRI to have discitis/osteomyelitis and an epidural abscess around C5-6. Operative culture from 9/27 positive for Serratia marcescens.   Pt is not a candidate for at home IV antibiotics in light of history of intravenous drug use.    Plan 1. Continue cefepime IV 2g q8h while inpatient 2. Switch to PO ciprofloxacin 750mg  BID till 11/22 at discharge 3. Scheduled outpatient ID follow-up on 10/19 with Dr. 11/19.     LOS: 17 days   Luciana Axe 12/29/2018, 1:02 PM

## 2018-12-30 DIAGNOSIS — R7881 Bacteremia: Secondary | ICD-10-CM

## 2018-12-30 LAB — BASIC METABOLIC PANEL
Anion gap: 10 (ref 5–15)
BUN: 27 mg/dL — ABNORMAL HIGH (ref 6–20)
CO2: 24 mmol/L (ref 22–32)
Calcium: 9.7 mg/dL (ref 8.9–10.3)
Chloride: 104 mmol/L (ref 98–111)
Creatinine, Ser: 0.6 mg/dL — ABNORMAL LOW (ref 0.61–1.24)
GFR calc Af Amer: >60 mL/min (ref >60)
GFR calc non Af Amer: >60 mL/min (ref >60)
Glucose, Bld: 95 mg/dL (ref 70–99)
Potassium: 4.6 mmol/L (ref 3.5–5.1)
Sodium: 138 mmol/L (ref 135–145)

## 2018-12-30 LAB — CBC WITH DIFFERENTIAL/PLATELET
Abs Immature Granulocytes: 0.03 10*3/uL (ref 0.00–0.07)
Basophils Absolute: 0 10*3/uL (ref 0.0–0.1)
Basophils Relative: 1 %
Eosinophils Absolute: 0.2 10*3/uL (ref 0.0–0.5)
Eosinophils Relative: 3 %
HCT: 39.9 % (ref 39.0–52.0)
Hemoglobin: 12.8 g/dL — ABNORMAL LOW (ref 13.0–17.0)
Immature Granulocytes: 0 %
Lymphocytes Relative: 22 %
Lymphs Abs: 1.6 10*3/uL (ref 0.7–4.0)
MCH: 26.3 pg (ref 26.0–34.0)
MCHC: 32.1 g/dL (ref 30.0–36.0)
MCV: 82.1 fL (ref 80.0–100.0)
Monocytes Absolute: 1 10*3/uL (ref 0.1–1.0)
Monocytes Relative: 14 %
Neutro Abs: 4.4 10*3/uL (ref 1.7–7.7)
Neutrophils Relative %: 60 %
Platelets: 186 10*3/uL (ref 150–400)
RBC: 4.86 MIL/uL (ref 4.22–5.81)
RDW: 19.1 % — ABNORMAL HIGH (ref 11.5–15.5)
WBC: 7.3 10*3/uL (ref 4.0–10.5)
nRBC: 0 % (ref 0.0–0.2)

## 2018-12-30 LAB — MAGNESIUM: Magnesium: 2 mg/dL (ref 1.7–2.4)

## 2018-12-30 MED ORDER — OXYCODONE HCL 5 MG PO TABS: 5.0000 mg | ORAL_TABLET | Freq: Three times a day (TID) | ORAL | 0 refills | Status: DC | PRN

## 2018-12-30 MED FILL — oxyCODONE HCL 5 MG TABS: 5 | 5 days supply | Qty: 15 | Fill #0

## 2018-12-30 NOTE — TOC Transition Note (Signed)
Transition of Care [facility]) - CM/SW Discharge Note Marvetta Gibbons RN,BSN Transitions of Care Unit 4NP coverage - RN Case Manager [phone]   Patient Details  Name: David Short MRN: [id number] Date of Birth: 11-16-1970  Transition of Care [facility] CM/SW Contact:  Dawayne Patricia, RN Phone Number: 12/30/2018, 12:47 PM   Clinical Narrative:    Pt stable for transition home, pt's mom here to provide transport. Pt has been released from custody. TOC to provide medications to bedside prior to discharge.    Final next level of care: Home/Self Care Barriers to Discharge: Barriers Resolved, No Barriers Identified   Patient Goals and CMS Choice Patient states their goals for this hospitalization and ongoing recovery are:: to go home   Choice offered to / list presented to : NA  Discharge Placement        Home             Discharge Plan and Services                DME Arranged: N/A DME Agency: NA       HH Arranged: NA HH Agency: NA        Social Determinants of Health (SDOH) Interventions     Readmission Risk Interventions Readmission Risk Prevention Plan 12/30/2018  Post Dischage Appt Complete  Medication Screening Complete  Transportation Screening Complete  Some recent data might be hidden

## 2018-12-30 NOTE — Plan of Care (Signed)

## 2018-12-30 NOTE — Discharge Summary (Signed)
Physician Discharge Summary  HARRINGTON JOBE WGN:[id number] DOB: 1970/12/23 DOA: 12/12/2018  PCP: Default, Provider, MD  Admit date: 12/12/2018 Discharge date: 12/30/2018  Admitted From: [address] Disposition: Home  Recommendations for Outpatient Follow-up:  1. Follow up with PCP in 1-2 weeks 2. Follow with ID at completion of oral antibiotics 3. Please obtain BMP/CBC in one week 4. Please follow up on the following pending results:  Home Health: None Equipment/Devices: None  Discharge Condition: Stable CODE STATUS: Full code Diet recommendation: Cardiac  Subjective: Patient seen and examined.  Feels much better.  Very minimal neck pain.  Wants to go home.  No other complaint.  Brief/Interim Summary: 48 year old Caucasian male with past medical history significant for IV heroin and cocaine abuse, chronic back pain and prior fractures of left arm and left ankle treated operatively, now presented from jail with complaints of neck and back pain.Underwent CSF examination which showed some pleocytosis, Admitted initially with tentative diagnosis of aseptic meningitis -on IV broad-spectrum IV antibiotics as well as antiviral medication.Blood cultures come back positive for Serratia. MRI brain / MRI C-spine with and without contrast was performed which showed evidence of C5-C6 discitis as well as osteomyelitis with ventral phlegmon/epidural abscess. Patient was transferred to [facility] for further assessment, management and surgery. Patient underwent decompressive anterior cervical discectomy C5-6, anterior cervical arthrodesisC5-6utilizing a cortico-cancellusbone graft and anterior cervical plating C5-6utilizing a Alphatecplateon 12/14/2018 by Neurosurgery. Subsequently his antibiotics were switched to oral Levaquin per ID recommendations however neurosurgery was not convinced and they thought that this was too early to switch.  Patient CRP was checked which was only minimally  elevated so patient's antibiotics were switched back to IV cefepime about a week ago.  ID was reconsulted yesterday on 12/29/2018 and they recommended switching back to oral Levaquin until 02/07/2019 and follow-up with him as an outpatient.  Last note from neurosurgery was 2 days ago.  They had no further recommendations to add.  Patient is doing fine, hemodynamically stable, has remained afebrile for last several days and cleared by all consultants so he is going to be discharged home.  I have prescribed him antibiotics through 02/07/2019 and have also prescribed him 15 tablets of oxycodone 5 mg every 8 hours as needed.  I strongly encouraged him to establish relationship with a PCP.  He verbalized understanding.  Discharge Diagnoses:  Principal Problem:   Vertebral osteomyelitis, acute-C5-C6 Active Problems:   Accelerated hypertension   Sinus tachycardia   mild Thrombocytopenia (HCC)   S/P cervical spinal fusion   Neck pain   Malnutrition of moderate degree   Bacteremia due to Gram-negative bacteria    Discharge Instructions  Discharge Instructions    Discharge patient   Complete by: As directed    Discharge disposition: 01-Home or Self Care   Discharge patient date: 12/30/2018     Allergies as of 12/30/2018      Reactions   Tylenol [acetaminophen] Hives      Medication List    STOP taking these medications   cephALEXin 500 MG capsule Commonly known as: Keflex     TAKE these medications   ibuprofen 800 MG tablet Commonly known as: ADVIL Take 1 tablet (800 mg total) by mouth 3 (three) times daily.   levofloxacin 750 MG tablet Commonly known as: LEVAQUIN Take 1 tablet (750 mg total) by mouth daily.   oxyCODONE 5 MG immediate release tablet Commonly known as: Roxicodone Take 1 tablet (5 mg total) by mouth every 8 (eight) hours as needed  for severe pain. What changed: when to take this   promethazine 25 MG tablet Commonly known as: PHENERGAN Take 1 tablet (25 mg  total) by mouth every 6 (six) hours as needed for nausea.       Allergies  Allergen Reactions  . Tylenol [Acetaminophen] Hives    Consultations: Neurosurgery   Procedures/Studies: Dg Cervical Spine 1 View  Result Date: 12/20/2018 CLINICAL DATA:  Follow-up anterior cervical fusion EXAM: DG CERVICAL SPINE - 1 VIEW COMPARISON:  None. FINDINGS: There is no evidence of cervical spine fracture or prevertebral soft tissue swelling. Alignment is normal. No other significant bone abnormalities are identified. Anterior cervical fusion at C5-6 with interbody spacer in satisfactory position without hardware failure or complication. IMPRESSION: Anterior cervical fusion at C5-6 without failure or complication. Electronically Signed   By: Elige Ko   On: 12/20/2018 10:09   Dg Cervical Spine 1 View  Result Date: 12/16/2018 CLINICAL DATA:  Post cervical spine fusion EXAM: DG CERVICAL SPINE - 1 VIEW COMPARISON:  Single lateral view compared to 12/14/2018 FINDINGS: Anterior plate and screws identified at C5-C6. Disc prosthesis present at C5-C6 disc space. Low normal osseous mineralization. No fracture, subluxation, or bone destruction identified on upright in collar view. IMPRESSION: Post anterior fusion of C5-C6. Electronically Signed   By: Ulyses Southward M.D.   On: 12/16/2018 08:43   Dg Cervical Spine 2-3 Views  Result Date: 12/14/2018 CLINICAL DATA:  ACDF EXAM: DG C-ARM 1-60 MIN; CERVICAL SPINE - 2-3 VIEW CONTRAST:  None FLUOROSCOPY TIME:  Fluoroscopy Time:  27 seconds Number of Acquired Spot Images: 1 COMPARISON:  MRI 12/13/2018 FINDINGS: Single low resolution intraoperative spot view of the cervical spine. Total fluoroscopy time was 27 seconds. The images demonstrate anterior plate and screw fixation at C5-C6 with interbody device. IMPRESSION: Intraoperative fluoroscopic assistance provided during cervical spine surgery Electronically Signed   By: Jasmine Pang M.D.   On: 12/14/2018 16:48   Ct Cervical  Spine Wo Contrast  Result Date: 12/13/2018 CLINICAL DATA:  Neck pain over the last week. Osteomyelitis discitis C5-6 by MRI report. Bilateral arm weakness. EXAM: CT CERVICAL SPINE WITHOUT CONTRAST TECHNIQUE: Multidetector CT imaging of the cervical spine was performed without intravenous contrast. Multiplanar CT image reconstructions were also generated. COMPARISON:  MRI earlier same day FINDINGS: Alignment: Straightening of the normal cervical lordosis. Skull base and vertebrae: Endplate irregularity at the superior endplate of C6 particularly anterior. No other focal bone finding. The inferior endplate at C5 appears intact. Soft tissues and spinal canal: No bony stenosis of the canal or foramina. No soft tissue abnormality definable by noncontrast CT. MRI shows C5-6 ventral epidural enhancing tissue. Disc levels: Disc levels are normal with exception of C5-6. The disc space does not appear frankly narrowed. Inferior endplate of C5 is intact. Superior endplate destruction anteriorly at C6 consistent with the clinical diagnosis of osteomyelitis. However, the disc itself at MRI does not show abnormal T2 signal or contrast enhancement. I also note that the white count is normal. Therefore, I suppose there is some possibility that this could be a pathologic fracture at C6 not related to infection or at least not 2 typical discitis osteomyelitis. Primary osteomyelitis is possible, which brings in 2 consideration it atypical organisms such as tuberculosis rather than the ordinary staph infection which usually begins in the disc. Upper chest: Emphysema and pulmonary scarring. Other: None IMPRESSION: The only CT finding is superior endplate irregularity and destruction anteriorly at C6. Ventral epidural process at this level is  not specifically appreciated by noncontrast CT but clearly shown by MRI. The inferior endplate at C5 is intact. The C5-6 disc space height is normal. This in combination with the MR findings of  absence of T2 signal or enhancement within the C5-6 disc raises the possibility of other processes besides typical staph discitis osteomyelitis. Atypical organisms such as tuberculosis can primarily infect the vertebral body. Tumor not completely excluded in this case. I note that the white count is also normal. Electronically Signed   By: Paulina FusiMark  Shogry M.D.   On: 12/13/2018 20:51   Mr Laqueta JeanBrain W ZOWo Contrast  Result Date: 12/13/2018 CLINICAL DATA:  Neck pain.  Bacteremia and fever EXAM: MRI HEAD WITHOUT AND WITH CONTRAST MRI CERVICAL SPINE WITHOUT AND WITH CONTRAST TECHNIQUE: Multiplanar, multiecho pulse sequences of the brain and surrounding structures, and cervical spine, to include the craniocervical junction and cervicothoracic junction, were obtained without and with intravenous contrast. CONTRAST:  5.525mL GADAVIST GADOBUTROL 1 MMOL/ML IV SOLN COMPARISON:  None. FINDINGS: MRI HEAD FINDINGS Brain: No acute infarction, hemorrhage, hydrocephalus, extra-axial collection or mass lesion. Normal white matter Normal enhancement postcontrast administration. Vascular: Normal arterial flow voids. Skull and upper cervical spine: Negative Sinuses/Orbits: Mucosal edema paranasal sinuses.  Normal orbit Other: None MRI CERVICAL SPINE FINDINGS Alignment: Normal Vertebrae: Bone marrow edema C5 and C6 vertebral bodies with enhancement throughout the C6 vertebral body. Mild irregularity superior endplate of C6 vertebral body. Ventral epidural soft tissue thickening and enhancement. Small nonenhancing soft tissue in the ventral epidural space of the disc space level of C5-6 may represent a small abscess. Findings most compatible with discitis and osteomyelitis given the history. Negative for fracture Cord: Normal spinal cord signal. Spinal cord evaluation limited by motion. Posterior Fossa, vertebral arteries, paraspinal tissues: Negative Disc levels: C2-3: Negative C3-4: Negative C4-5: Negative C5-6: Bone marrow edema C5 and C6  vertebral bodies with enhancement of the C6 vertebral body. Early endplate erosion superior endplate of C6. Ventral epidural soft tissue thickening behind C5 and C6. Nonenhancing component in the ventral epidural space may represent a small 3 mm abscess. This process is causing cord flattening and moderate spinal stenosis. There is also foraminal narrowing bilaterally due to soft tissue enhancement and thickening. C6-7: Negative C7-T1: Negative IMPRESSION: 1. Negative MRI brain with contrast 2. Findings compatible with discitis and osteomyelitis at C5-6. Ventral epidural phlegmon and small 3 mm ventral epidural abscess causing moderate spinal stenosis. 3. These results were called by telephone at the time of interpretation on 12/13/2018 at 1:56 pm to provider [facility] PAT[facility] , who verbally acknowledged these results. Electronically Signed   By: Marlan Palauharles  Clark M.D.   On: 12/13/2018 13:57   Mr Cervical Spine W Wo Contrast  Result Date: 12/13/2018 CLINICAL DATA:  Neck pain.  Bacteremia and fever EXAM: MRI HEAD WITHOUT AND WITH CONTRAST MRI CERVICAL SPINE WITHOUT AND WITH CONTRAST TECHNIQUE: Multiplanar, multiecho pulse sequences of the brain and surrounding structures, and cervical spine, to include the craniocervical junction and cervicothoracic junction, were obtained without and with intravenous contrast. CONTRAST:  5.135mL GADAVIST GADOBUTROL 1 MMOL/ML IV SOLN COMPARISON:  None. FINDINGS: MRI HEAD FINDINGS Brain: No acute infarction, hemorrhage, hydrocephalus, extra-axial collection or mass lesion. Normal white matter Normal enhancement postcontrast administration. Vascular: Normal arterial flow voids. Skull and upper cervical spine: Negative Sinuses/Orbits: Mucosal edema paranasal sinuses.  Normal orbit Other: None MRI CERVICAL SPINE FINDINGS Alignment: Normal Vertebrae: Bone marrow edema C5 and C6 vertebral bodies with enhancement throughout the C6 vertebral body. Mild irregularity superior  endplate of C6 vertebral  body. Ventral epidural soft tissue thickening and enhancement. Small nonenhancing soft tissue in the ventral epidural space of the disc space level of C5-6 may represent a small abscess. Findings most compatible with discitis and osteomyelitis given the history. Negative for fracture Cord: Normal spinal cord signal. Spinal cord evaluation limited by motion. Posterior Fossa, vertebral arteries, paraspinal tissues: Negative Disc levels: C2-3: Negative C3-4: Negative C4-5: Negative C5-6: Bone marrow edema C5 and C6 vertebral bodies with enhancement of the C6 vertebral body. Early endplate erosion superior endplate of C6. Ventral epidural soft tissue thickening behind C5 and C6. Nonenhancing component in the ventral epidural space may represent a small 3 mm abscess. This process is causing cord flattening and moderate spinal stenosis. There is also foraminal narrowing bilaterally due to soft tissue enhancement and thickening. C6-7: Negative C7-T1: Negative IMPRESSION: 1. Negative MRI brain with contrast 2. Findings compatible with discitis and osteomyelitis at C5-6. Ventral epidural phlegmon and small 3 mm ventral epidural abscess causing moderate spinal stenosis. 3. These results were called by telephone at the time of interpretation on 12/13/2018 at 1:56 pm to provider [facility] PATEL , who verbally acknowledged these results. Electronically Signed   By: Marlan Palau M.D.   On: 12/13/2018 13:57   Dg Chest Portable 1 View  Result Date: 12/12/2018 CLINICAL DATA:  Fever EXAM: PORTABLE CHEST 1 VIEW COMPARISON:  November 29, 2012 FINDINGS: No edema consolidation. Heart size and pulmonary vascularity are normal. No adenopathy. No bone lesions. IMPRESSION: No edema or consolidation. Electronically Signed   By: Bretta Bang III M.D.   On: 12/12/2018 10:18   Dg C-arm 1-60 Min  Result Date: 12/14/2018 CLINICAL DATA:  ACDF EXAM: DG C-ARM 1-60 MIN; CERVICAL SPINE - 2-3 VIEW CONTRAST:  None FLUOROSCOPY TIME:   Fluoroscopy Time:  27 seconds Number of Acquired Spot Images: 1 COMPARISON:  MRI 12/13/2018 FINDINGS: Single low resolution intraoperative spot view of the cervical spine. Total fluoroscopy time was 27 seconds. The images demonstrate anterior plate and screw fixation at C5-C6 with interbody device. IMPRESSION: Intraoperative fluoroscopic assistance provided during cervical spine surgery Electronically Signed   By: Jasmine Pang M.D.   On: 12/14/2018 16:48     Discharge Exam: Vitals:   12/30/18 0457 12/30/18 0801  BP: 128/88 (!) 128/95  Pulse: 93 93  Resp: 18   Temp: 98 F (36.7 C) 97.8 F (36.6 C)  SpO2: 95% 96%   Vitals:   12/30/18 0031 12/30/18 0457 12/30/18 0500 12/30/18 0801  BP: 140/88 128/88  (!) 128/95  Pulse: (!) 105 93  93  Resp: 18 18    Temp: 97.7 F (36.5 C) 98 F (36.7 C)  97.8 F (36.6 C)  TempSrc: Oral Oral  Oral  SpO2: 93% 95%  96%  Weight:   71.2 kg   Height:        General: Pt is alert, awake, not in acute distress Cardiovascular: RRR, S1/S2 +, no rubs, no gallops Respiratory: CTA bilaterally, no wheezing, no rhonchi Abdominal: Soft, NT, ND, bowel sounds + Extremities: no edema, no cyanosis    The results of significant diagnostics from this hospitalization (including imaging, microbiology, ancillary and laboratory) are listed below for reference.     Microbiology: No results found for this or any previous visit (from the past 240 hour(s)).   Labs: BNP (last 3 results) No results for input(s): BNP in the last 8760 hours. Basic Metabolic Panel: Recent Labs  Lab 12/24/18 0352 12/27/18 0531 12/30/18 0602  NA 138 138 138  K 4.7 4.4 4.6  CL 102 102 104  CO2 26 25 24   GLUCOSE 121* 102* 95  BUN 24* 22* 27*  CREATININE 0.60* 0.55* 0.60*  CALCIUM 9.7 9.9 9.7  MG  --   --  2.0   Liver Function Tests: Recent Labs  Lab 12/24/18 0352  AST 42*  ALT 45*  ALKPHOS 145*  BILITOT 0.4  PROT 7.4  ALBUMIN 3.2*   No results for input(s): LIPASE,  AMYLASE in the last 168 hours. No results for input(s): AMMONIA in the last 168 hours. CBC: Recent Labs  Lab 12/24/18 0352 12/27/18 0531 12/28/18 0429 12/29/18 0458 12/30/18 0602  WBC 6.4 7.1 6.3 6.6 7.3  NEUTROABS 3.9  --   --   --  4.4  HGB 11.9* 13.0 13.6 12.9* 12.8*  HCT 37.1* 40.0 44.1 39.7 39.9  MCV 82.3 81.3 82.6 80.9 82.1  PLT 203 212 217 197 186   Cardiac Enzymes: No results for input(s): CKTOTAL, CKMB, CKMBINDEX, TROPONINI in the last 168 hours. BNP: Invalid input(s): POCBNP CBG: No results for input(s): GLUCAP in the last 168 hours. D-Dimer No results for input(s): DDIMER in the last 72 hours. Hgb A1c No results for input(s): HGBA1C in the last 72 hours. Lipid Profile No results for input(s): CHOL, HDL, LDLCALC, TRIG, CHOLHDL, LDLDIRECT in the last 72 hours. Thyroid function studies No results for input(s): TSH, T4TOTAL, T3FREE, THYROIDAB in the last 72 hours.  Invalid input(s): FREET3 Anemia work up No results for input(s): VITAMINB12, FOLATE, FERRITIN, TIBC, IRON, RETICCTPCT in the last 72 hours. Urinalysis    Component Value Date/Time   COLORURINE YELLOW 12/12/2018 1221   APPEARANCEUR CLEAR 12/12/2018 1221   LABSPEC 1.012 12/12/2018 1221   PHURINE 6.0 12/12/2018 1221   GLUCOSEU NEGATIVE 12/12/2018 1221   HGBUR SMALL (A) 12/12/2018 1221   BILIRUBINUR NEGATIVE 12/12/2018 1221   KETONESUR NEGATIVE 12/12/2018 1221   PROTEINUR NEGATIVE 12/12/2018 1221   UROBILINOGEN 1.0 03/17/2012 0047   NITRITE NEGATIVE 12/12/2018 1221   LEUKOCYTESUR NEGATIVE 12/12/2018 1221   Sepsis Labs Invalid input(s): PROCALCITONIN,  WBC,  LACTICIDVEN Microbiology No results found for this or any previous visit (from the past 240 hour(s)).   Time coordinating discharge: Over 30 minutes  SIGNED:   Darliss Cheney, MD  [facility] 12/30/2018, 9:08 AM  If 7PM-7AM, please contact night-coverage [email or web address] Password [email or web address]

## 2018-12-30 NOTE — Discharge Instructions (Signed)
Osteomyelitis, Adult  Bone infections (osteomyelitis) occur when bacteria or other germs get inside a bone. This can happen if you have an infection in another part of your body that spreads through your blood. Germs from your skin or from outside of your body can also cause this type of infection if you have a wound or a broken bone (fracture) that breaks the skin. Bone infections need to be treated quickly to prevent bone damage and to prevent the infection from spreading to other areas of your body. What are the causes? Most bone infections are caused by bacteria. They can also be caused by other germs, such as viruses and funguses. What increases the risk? You are more likely to develop this condition if you:  Recently had surgery, especially bone or joint surgery.  Have a long-term (chronic) disease, such as: ? Diabetes. ? HIV (human immunodeficiency virus). ? Rheumatoid arthritis. ? Sickle cell anemia. ? Kidney disease that requires dialysis.  Are aged 60 years or older.  Have a condition or take medicines that block or weaken your body's defense system (immune system).  Have a condition that reduces your blood flow.  Have an artificial joint.  Have had a joint or bone repaired with plates or screws (surgical hardware).  Use IV drugs.  Have a central line for IV access.  Have had trauma, such as stepping on a nail or a broken bone that came through the skin. What are the signs or symptoms? Symptoms vary depending on the type and location of your infection. Common symptoms of bone infections include:  Fever and chills.  Skin redness and warmth.  Swelling.  Pain and stiffness.  Drainage of fluid or pus near the infection. How is this diagnosed? This condition may be diagnosed based on:  Your symptoms and medical history.  A physical exam.  Tests, such as: ? A sample of tissue, fluid, or blood taken to be examined under a microscope. ? Pus or discharge swabbed  from a wound for testing to identify germs and to determine what type of medicine will kill them (culture and sensitivity). ? Blood tests.  Imaging studies. These may include: ? X-rays. ? MRI. ? CT scan. ? Bone scan. ? Ultrasound. How is this treated? Treatment for this condition depends on the cause and type of infection. Antibiotic medicines are usually the first treatment for a bone infection. This may be done in a hospital at first. You may have to continue IV antibiotics at home or take antibiotics by mouth for several weeks after that. Other treatments may include surgery to remove:  Dead or dying tissue from a bone.  An infected artificial joint.  Infected plates or screws that were used to repair a broken bone. Follow these instructions at home: Medicines   Take over-the-counter and prescription medicines only as told by your health care provider.  Take your antibiotic medicine as told by your health care provider. Do not stop taking the antibiotic even if you start to feel better.  Follow instructions from your health care provider about how to take IV antibiotics at home. You may need to have a nurse come to your home to give you the IV antibiotics. General instructions   Ask your health care provider if you have any restrictions on your activities.  If directed, put ice on the affected area: ? Put ice in a plastic bag. ? Place a towel between your skin and the bag. ? Leave the ice on for 20   minutes, 2-3 times a day.  Wash your hands often with soap and water. If soap and water are not available, use hand sanitizer.  Do not use any products that contain nicotine or tobacco, such as cigarettes and e-cigarettes. These can delay bone healing. If you need help quitting, ask your health care provider.  Keep all follow-up visits as told by your health care provider. This is important. Contact a health care provider if:  You develop a fever or chills.  You have  redness, warmth, pain, or swelling that returns after treatment. Get help right away if:  You have rapid breathing or you have trouble breathing.  You have chest pain.  You cannot drink fluids or make urine.  The affected area swells, changes color, or turns blue.  You have numbness or severe pain in the affected area. Summary  Bone infections (osteomyelitis) occur when bacteria or other germs get inside a bone.  You may be more likely to get this type of infection if you have a condition, such as diabetes, that lowers your ability to fight infection or increases your chances of getting an infection.  Most bone infections are caused by bacteria. They can also be caused by other germs, such as viruses and funguses.  Treatment for this condition usually starts with taking antibiotics. Further treatment depends on the cause and type of infection. This information is not intended to replace advice given to you by your health care provider. Make sure you discuss any questions you have with your health care provider. Document Released: 03/05/2005 Document Revised: 03/21/2017 Document Reviewed: 03/14/2017 Elsevier Patient Education  2020 [employer]  

## 2019-01-05 ENCOUNTER — Inpatient Hospital Stay: Admitting: Internal Medicine

## 2019-01-05 ENCOUNTER — Encounter (HOSPITAL_COMMUNITY): Payer: Self-pay | Admitting: Family Medicine

## 2019-01-05 ENCOUNTER — Emergency Department (HOSPITAL_COMMUNITY)
Admission: EM | Admit: 2019-01-05 | Discharge: 2019-01-05 | Disposition: A | Discharge status: Home / Self Care | Attending: Emergency Medicine | Admitting: Emergency Medicine

## 2019-01-05 DIAGNOSIS — T40601A Poisoning by unspecified narcotics, accidental (unintentional), initial encounter: Secondary | ICD-10-CM | POA: Insufficient documentation

## 2019-01-05 DIAGNOSIS — I10 Essential (primary) hypertension: Secondary | ICD-10-CM | POA: Insufficient documentation

## 2019-01-05 DIAGNOSIS — F17210 Nicotine dependence, cigarettes, uncomplicated: Secondary | ICD-10-CM | POA: Insufficient documentation

## 2019-01-05 DIAGNOSIS — Z79899 Other long term (current) drug therapy: Secondary | ICD-10-CM | POA: Insufficient documentation

## 2019-01-05 NOTE — ED Triage Notes (Signed)
Patient is from home and transported via [facility]. On 09/27, patient had anterior cervical discectomy, fusion cervical 5 &6. Patient was prescribed Oxycodone 5mg  po. He reports he took 5 tablets (Oxycodone 25mg  total) around 10 am this morning due to intense pain. He lives with his aunt, who reports he walked out side to his vehicle. Patient's aunt found him unresponsive in the passenger side, not breathing. When first-responders arrived patient was ventilated by BVM and Narcan 2mg  administered nasally. Now, he is alert, oriented x 4. He states that he took 2 tablets around 4am this morning and an additional 4-5 tablets around 10 am for the pain. He denies wanting to commit SI. He is calm and cooperative. Further, he states he has been up all night from the unbearable pain.

## 2019-01-05 NOTE — Discharge Instructions (Addendum)
You were seen in the emergency department for being unresponsive after taking extra tablets of your oxycodone.  You were observed for a period of time and remained awake and alert.  It is important that you only take your medication as directed.  Please follow-up with your doctor and return if any worsening symptoms.

## 2019-01-05 NOTE — ED Provider Notes (Signed)
[facility] DEPT Provider Note   CSN: [id number] Arrival date & time: 01/05/19  1128     History   Chief Complaint Chief Complaint  Patient presents with  . Drug Overdose    HPI David Short is a 48 y.o. male.  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  He said he had more neck pain this morning and took 5 of his oxycodone as opposed to typical 2.  He does not recall anything after that but per EMS he went outside and into his car where he was found by family to be unresponsive.  Fire department gave Narcan 2 mg and bag-valve-mask to assist with ventilations until he became more alert.  He never received any chest compressions.  Currently he is complaining of neck pain but no other complaints.  He denies any intention of hurting himself.  He only took the medication to help relieve his neck pain.  No new numbness or weakness.  No chest pain or shortness of breath.     The history is provided by the patient.  Drug Overdose This is a new problem. The current episode started less than 1 hour ago. The problem has been rapidly improving. Pertinent negatives include no chest pain, no abdominal pain, no headaches and no shortness of breath. Nothing aggravates the symptoms. The treatment provided significant (narcan) relief.    No past medical history on file.  Patient Active Problem List   Diagnosis Date Noted  . Bacteremia due to Gram-negative bacteria 12/30/2018  . Vertebral osteomyelitis, acute-C5-C6 12/26/2018  . Neck pain 12/15/2018  . Malnutrition of moderate degree 12/15/2018  . S/P cervical spinal fusion 12/14/2018  . Aseptic meningitis 12/12/2018  . Accelerated hypertension 12/12/2018  . Sinus tachycardia 12/12/2018  . mild Thrombocytopenia ([place]) 12/12/2018    Past Surgical History:  Procedure Laterality Date  . ANTERIOR CERVICAL DECOMP/DISCECTOMY FUSION N/A 12/14/2018   Procedure: ANTERIOR CERVICAL DISCECTOMY  FUSION CERVICAL FIVE- CERVICAL SIX;  Surgeon: Eustace Moore, MD;  Location: [facility];  Service: Neurosurgery;  Laterality: N/A;  . BACK SURGERY     states never had surgery  . FRACTURE SURGERY Left    elbow/wrist/ankle        Home Medications    Prior to Admission medications   Medication Sig Start Date End Date Taking? Authorizing Provider  ibuprofen (ADVIL,MOTRIN) 800 MG tablet Take 1 tablet (800 mg total) by mouth 3 (three) times daily. Patient not taking: Reported on 08/18/2016 03/17/12   Schinlever, Barnetta Chapel, PA-C  levofloxacin (LEVAQUIN) 750 MG tablet Take 1 tablet (750 mg total) by mouth daily. 12/29/18 02/07/19  Darliss Cheney, MD  oxyCODONE (ROXICODONE) 5 MG immediate release tablet Take 1 tablet (5 mg total) by mouth every 8 (eight) hours as needed for severe pain. 12/30/18   Darliss Cheney, MD  promethazine (PHENERGAN) 25 MG tablet Take 1 tablet (25 mg total) by mouth every 6 (six) hours as needed for nausea. Patient not taking: Reported on 08/18/2016 03/17/12   Schinlever, Barnetta Chapel, PA-C    Family History No family history on file.  Social History Social History   Tobacco Use  . Smoking status: Current Every Day Smoker    Packs/day: 1.00    Types: Cigarettes  . Smokeless tobacco: Never Used  Substance Use Topics  . Alcohol use: No  . Drug use: Not Currently    Types: IV    Comment: opiates     Allergies   Tylenol [acetaminophen]  Review of Systems Review of Systems  Constitutional: Negative for fever.  HENT: Negative for sore throat.   Eyes: Negative for visual disturbance.  Respiratory: Negative for shortness of breath.   Cardiovascular: Negative for chest pain.  Gastrointestinal: Negative for abdominal pain.  Genitourinary: Negative for dysuria.  Musculoskeletal: Positive for neck pain.  Skin: Negative for rash.  Neurological: Negative for headaches.  Psychiatric/Behavioral: Negative for suicidal ideas.     Physical Exam Updated Vital Signs BP  (!) 149/111   Pulse (!) 116   Temp 98.2 F (36.8 C) (Oral)   Resp 16   SpO2 92%   Physical Exam Vitals signs and nursing note reviewed.  Constitutional:      Appearance: He is well-developed.  HENT:     Head: Normocephalic and atraumatic.  Eyes:     Conjunctiva/sclera: Conjunctivae normal.  Neck:     Musculoskeletal: Neck supple.  Cardiovascular:     Rate and Rhythm: Regular rhythm. Tachycardia present.     Heart sounds: No murmur.  Pulmonary:     Effort: Pulmonary effort is normal. No respiratory distress.     Breath sounds: Normal breath sounds.  Abdominal:     Palpations: Abdomen is soft.     Tenderness: There is no abdominal tenderness.  Musculoskeletal: Normal range of motion.        General: No deformity or signs of injury.  Skin:    General: Skin is warm and dry.     Capillary Refill: Capillary refill takes less than 2 seconds.  Neurological:     Mental Status: He is alert. Mental status is at baseline.      ED Treatments / Results  Labs (all labs ordered are listed, but only abnormal results are displayed) Labs Reviewed - No data to display  EKG EKG Interpretation  Date/Time:  Monday January 05 2019 11:57:57 EDT Ventricular Rate:  109 PR Interval:    QRS Duration: 100 QT Interval:  354 QTC Calculation: 477 R Axis:   -88 Text Interpretation:  Sinus tachycardia Consider left atrial enlargement Left anterior fascicular block Low voltage, precordial leads Abnormal R-wave progression, late transition Borderline prolonged QT interval similar to prior 9/20 Confirmed by Meridee ScoreButler, Michael [phone]([id number]) on 01/05/2019 12:04:26 PM   Radiology No results found.  Procedures Procedures (including critical care time)  Medications Ordered in ED Medications - No data to display   Initial Impression / Assessment and Plan / ED Course  I have reviewed the triage vital signs and the nursing notes.  Pertinent labs & imaging results that were available during my care of  the patient were reviewed by me and considered in my medical decision making (see chart for details).  Clinical Course as of Jan 04 1926  Mon Jan 05, 2019  1214 Patient here after self-medicating with oral oxycodone to control his neck pain.  Needed Narcan and brief positive pressure ventilation.  Currently only complaining of his chronic neck pain.  Sats on the low side so will observe to make sure he does not develop any pulmonary edema.  Does not sound like self-harm.   [MB]  1319 Evaluated patient, he is resting comfortably.  He was placed on oxygen although not requiring it.   [MB]  1444 Reevaluated patient, he is awake and alert and denies any trouble breathing.  He understands that he is to only use the medication as directed.  We talked about indications for return to the hospital.   [MB]    Clinical Course User  Index [MB] Terrilee Files, MD        Final Clinical Impressions(s) / ED Diagnoses   Final diagnoses:  Opiate overdose, accidental or unintentional, initial encounter [facility])    ED Discharge Orders    None       Terrilee Files, MD 01/05/19 1927

## 2019-01-12 LAB — HSV DNA BY PCR (REFERENCE LAB)
HSV 1 DNA: NEGATIVE
HSV 2 DNA: NEGATIVE

## 2019-02-02 ENCOUNTER — Inpatient Hospital Stay: Admitting: Internal Medicine

## 2020-06-17 ENCOUNTER — Emergency Department (HOSPITAL_COMMUNITY): Payer: Medicaid Other

## 2020-06-17 ENCOUNTER — Inpatient Hospital Stay (HOSPITAL_COMMUNITY)
Admission: EM | Admit: 2020-06-17 | Discharge: 2020-06-26 | DRG: 291 | Disposition: A | Discharge status: Home Health | Payer: Medicaid Other | Attending: Internal Medicine | Admitting: Internal Medicine

## 2020-06-17 DIAGNOSIS — Z681 Body mass index (BMI) 19 or less, adult: Secondary | ICD-10-CM

## 2020-06-17 DIAGNOSIS — I110 Hypertensive heart disease with heart failure: Principal | ICD-10-CM | POA: Diagnosis present

## 2020-06-17 DIAGNOSIS — R0600 Dyspnea, unspecified: Secondary | ICD-10-CM

## 2020-06-17 DIAGNOSIS — D509 Iron deficiency anemia, unspecified: Secondary | ICD-10-CM

## 2020-06-17 DIAGNOSIS — D696 Thrombocytopenia, unspecified: Secondary | ICD-10-CM | POA: Diagnosis present

## 2020-06-17 DIAGNOSIS — F17210 Nicotine dependence, cigarettes, uncomplicated: Secondary | ICD-10-CM | POA: Diagnosis present

## 2020-06-17 DIAGNOSIS — R188 Other ascites: Secondary | ICD-10-CM

## 2020-06-17 DIAGNOSIS — Z20822 Contact with and (suspected) exposure to covid-19: Secondary | ICD-10-CM | POA: Diagnosis present

## 2020-06-17 DIAGNOSIS — K766 Portal hypertension: Secondary | ICD-10-CM | POA: Diagnosis present

## 2020-06-17 DIAGNOSIS — E8809 Other disorders of plasma-protein metabolism, not elsewhere classified: Secondary | ICD-10-CM | POA: Diagnosis present

## 2020-06-17 DIAGNOSIS — K5900 Constipation, unspecified: Secondary | ICD-10-CM | POA: Diagnosis not present

## 2020-06-17 DIAGNOSIS — F1011 Alcohol abuse, in remission: Secondary | ICD-10-CM | POA: Diagnosis present

## 2020-06-17 DIAGNOSIS — I4510 Unspecified right bundle-branch block: Secondary | ICD-10-CM | POA: Diagnosis present

## 2020-06-17 DIAGNOSIS — D638 Anemia in other chronic diseases classified elsewhere: Secondary | ICD-10-CM | POA: Diagnosis present

## 2020-06-17 DIAGNOSIS — R0902 Hypoxemia: Secondary | ICD-10-CM

## 2020-06-17 DIAGNOSIS — D689 Coagulation defect, unspecified: Secondary | ICD-10-CM | POA: Diagnosis present

## 2020-06-17 DIAGNOSIS — J90 Pleural effusion, not elsewhere classified: Secondary | ICD-10-CM

## 2020-06-17 DIAGNOSIS — Z765 Malingerer [conscious simulation]: Secondary | ICD-10-CM

## 2020-06-17 DIAGNOSIS — I5020 Unspecified systolic (congestive) heart failure: Secondary | ICD-10-CM

## 2020-06-17 DIAGNOSIS — I272 Pulmonary hypertension, unspecified: Secondary | ICD-10-CM | POA: Diagnosis present

## 2020-06-17 DIAGNOSIS — R0602 Shortness of breath: Secondary | ICD-10-CM

## 2020-06-17 DIAGNOSIS — F1123 Opioid dependence with withdrawal: Secondary | ICD-10-CM | POA: Diagnosis present

## 2020-06-17 DIAGNOSIS — N179 Acute kidney failure, unspecified: Secondary | ICD-10-CM | POA: Diagnosis present

## 2020-06-17 DIAGNOSIS — I5023 Acute on chronic systolic (congestive) heart failure: Secondary | ICD-10-CM | POA: Diagnosis present

## 2020-06-17 DIAGNOSIS — J9602 Acute respiratory failure with hypercapnia: Secondary | ICD-10-CM | POA: Diagnosis present

## 2020-06-17 DIAGNOSIS — Z981 Arthrodesis status: Secondary | ICD-10-CM

## 2020-06-17 DIAGNOSIS — J9601 Acute respiratory failure with hypoxia: Secondary | ICD-10-CM | POA: Diagnosis present

## 2020-06-17 DIAGNOSIS — Z886 Allergy status to analgesic agent status: Secondary | ICD-10-CM

## 2020-06-17 DIAGNOSIS — I509 Heart failure, unspecified: Secondary | ICD-10-CM

## 2020-06-17 DIAGNOSIS — K7460 Unspecified cirrhosis of liver: Secondary | ICD-10-CM | POA: Diagnosis present

## 2020-06-17 DIAGNOSIS — J441 Chronic obstructive pulmonary disease with (acute) exacerbation: Secondary | ICD-10-CM | POA: Diagnosis present

## 2020-06-17 DIAGNOSIS — E43 Unspecified severe protein-calorie malnutrition: Secondary | ICD-10-CM | POA: Diagnosis present

## 2020-06-17 DIAGNOSIS — R161 Splenomegaly, not elsewhere classified: Secondary | ICD-10-CM | POA: Diagnosis present

## 2020-06-17 HISTORY — DX: Other chronic pain: G89.29

## 2020-06-17 HISTORY — DX: Essential (primary) hypertension: I10

## 2020-06-17 HISTORY — DX: Tobacco use: Z72.0

## 2020-06-17 HISTORY — DX: Other chronic pain: M54.9

## 2020-06-17 HISTORY — DX: Other psychoactive substance use, unspecified, uncomplicated: F19.90

## 2020-06-17 HISTORY — DX: Osteomyelitis, unspecified: M86.9

## 2020-06-17 LAB — CBC WITH DIFFERENTIAL/PLATELET
Abs Immature Granulocytes: 0.59 10*3/uL — ABNORMAL HIGH (ref 0.00–0.07)
Basophils Absolute: 0.1 10*3/uL (ref 0.0–0.1)
Basophils Relative: 1 %
Eosinophils Absolute: 0.2 10*3/uL (ref 0.0–0.5)
Eosinophils Relative: 2 %
HCT: 29.4 % — ABNORMAL LOW (ref 39.0–52.0)
Hemoglobin: 6.4 g/dL — CL (ref 13.0–17.0)
Immature Granulocytes: 6 %
Lymphocytes Relative: 9 %
Lymphs Abs: 0.8 10*3/uL (ref 0.7–4.0)
MCH: 16.7 pg — ABNORMAL LOW (ref 26.0–34.0)
MCHC: 21.8 g/dL — ABNORMAL LOW (ref 30.0–36.0)
MCV: 76.8 fL — ABNORMAL LOW (ref 80.0–100.0)
Monocytes Absolute: 0.9 10*3/uL (ref 0.1–1.0)
Monocytes Relative: 10 %
Neutro Abs: 6.7 10*3/uL (ref 1.7–7.7)
Neutrophils Relative %: 72 %
Platelets: 127 10*3/uL — ABNORMAL LOW (ref 150–400)
RBC: 3.83 MIL/uL — ABNORMAL LOW (ref 4.22–5.81)
RDW: 23.6 % — ABNORMAL HIGH (ref 11.5–15.5)
WBC: 9.2 10*3/uL (ref 4.0–10.5)
nRBC: 1.5 % — ABNORMAL HIGH (ref 0.0–0.2)

## 2020-06-17 LAB — RESP PANEL BY RT-PCR (FLU A&B, COVID) ARPGX2
Influenza A by PCR: NEGATIVE
Influenza B by PCR: NEGATIVE
SARS Coronavirus 2 by RT PCR: NEGATIVE

## 2020-06-17 LAB — I-STAT ARTERIAL BLOOD GAS, ED
Acid-Base Excess: 2 mmol/L (ref 0.0–2.0)
Bicarbonate: 28.5 mmol/L — ABNORMAL HIGH (ref 20.0–28.0)
Calcium, Ion: 1.21 mmol/L (ref 1.15–1.40)
HCT: 25 % — ABNORMAL LOW (ref 39.0–52.0)
Hemoglobin: 8.5 g/dL — ABNORMAL LOW (ref 13.0–17.0)
O2 Saturation: 100 %
Patient temperature: 98
Potassium: 5.8 mmol/L — ABNORMAL HIGH (ref 3.5–5.1)
Sodium: 135 mmol/L (ref 135–145)
TCO2: 30 mmol/L (ref 22–32)
pCO2 arterial: 52.4 mmHg — ABNORMAL HIGH (ref 32.0–48.0)
pH, Arterial: 7.341 — ABNORMAL LOW (ref 7.350–7.450)
pO2, Arterial: 184 mmHg — ABNORMAL HIGH (ref 83.0–108.0)

## 2020-06-17 LAB — COMPREHENSIVE METABOLIC PANEL
ALT: 15 U/L (ref 0–44)
AST: 34 U/L (ref 15–41)
Albumin: 2.2 g/dL — ABNORMAL LOW (ref 3.5–5.0)
Alkaline Phosphatase: 105 U/L (ref 38–126)
Anion gap: 4 — ABNORMAL LOW (ref 5–15)
BUN: 43 mg/dL — ABNORMAL HIGH (ref 6–20)
CO2: 26 mmol/L (ref 22–32)
Calcium: 8.2 mg/dL — ABNORMAL LOW (ref 8.9–10.3)
Chloride: 104 mmol/L (ref 98–111)
Creatinine, Ser: 1.68 mg/dL — ABNORMAL HIGH (ref 0.61–1.24)
GFR, Estimated: 49 mL/min — ABNORMAL LOW (ref >60)
Glucose, Bld: 93 mg/dL (ref 70–99)
Potassium: 5.9 mmol/L — ABNORMAL HIGH (ref 3.5–5.1)
Sodium: 134 mmol/L — ABNORMAL LOW (ref 135–145)
Total Bilirubin: 1.4 mg/dL — ABNORMAL HIGH (ref 0.3–1.2)
Total Protein: 8.2 g/dL — ABNORMAL HIGH (ref 6.5–8.1)

## 2020-06-17 LAB — I-STAT VENOUS BLOOD GAS, ED
Acid-Base Excess: 3 mmol/L — ABNORMAL HIGH (ref 0.0–2.0)
Bicarbonate: 28.9 mmol/L — ABNORMAL HIGH (ref 20.0–28.0)
Calcium, Ion: 1.06 mmol/L — ABNORMAL LOW (ref 1.15–1.40)
HCT: 27 % — ABNORMAL LOW (ref 39.0–52.0)
Hemoglobin: 9.2 g/dL — ABNORMAL LOW (ref 13.0–17.0)
O2 Saturation: 100 %
Potassium: 6.1 mmol/L — ABNORMAL HIGH (ref 3.5–5.1)
Sodium: 136 mmol/L (ref 135–145)
TCO2: 31 mmol/L (ref 22–32)
pCO2, Ven: 53.3 mmHg (ref 44.0–60.0)
pH, Ven: 7.342 (ref 7.250–7.430)
pO2, Ven: 188 mmHg — ABNORMAL HIGH (ref 32.0–45.0)

## 2020-06-17 LAB — RAPID URINE DRUG SCREEN, HOSP PERFORMED
Amphetamines: NOT DETECTED
Barbiturates: NOT DETECTED
Benzodiazepines: NOT DETECTED
Cocaine: POSITIVE — AB
Opiates: POSITIVE — AB
Tetrahydrocannabinol: NOT DETECTED

## 2020-06-17 LAB — LACTIC ACID, PLASMA: Lactic Acid, Venous: 1.4 mmol/L (ref 0.5–1.9)

## 2020-06-17 LAB — PROTIME-INR
INR: 1.5 — ABNORMAL HIGH (ref 0.8–1.2)
Prothrombin Time: 17.2 seconds — ABNORMAL HIGH (ref 11.4–15.2)

## 2020-06-17 LAB — TROPONIN I (HIGH SENSITIVITY): Troponin I (High Sensitivity): 8 ng/L (ref <18)

## 2020-06-17 LAB — BRAIN NATRIURETIC PEPTIDE: B Natriuretic Peptide: 2239 pg/mL — ABNORMAL HIGH (ref 0.0–100.0)

## 2020-06-17 LAB — PREPARE RBC (CROSSMATCH)

## 2020-06-17 LAB — AMMONIA: Ammonia: 52 umol/L — ABNORMAL HIGH (ref 9–35)

## 2020-06-17 MED ORDER — OXYCODONE HCL 5 MG PO TABS: 5.0000 mg | ORAL_TABLET | Freq: Once | ORAL | Status: DC

## 2020-06-17 MED ORDER — IOHEXOL 300 MG/ML  SOLN
100.0000 mL | Freq: Once | INTRAMUSCULAR | Status: AC | PRN
  Administered 2020-06-17: 100 mL via INTRAVENOUS

## 2020-06-17 MED ORDER — MAGNESIUM SULFATE 2 GM/50ML IV SOLN
2.0000 g | Freq: Once | INTRAVENOUS | Status: AC
  Administered 2020-06-17: 2 g via INTRAVENOUS
  Filled 2020-06-17: qty 50

## 2020-06-17 MED ORDER — SODIUM CHLORIDE 0.9 % IV SOLN
1.0000 g | Freq: Once | INTRAVENOUS | Status: AC
  Administered 2020-06-18: 1 g via INTRAVENOUS
  Filled 2020-06-17: qty 10

## 2020-06-17 MED ORDER — FUROSEMIDE 10 MG/ML IJ SOLN
40.0000 mg | Freq: Once | INTRAMUSCULAR | Status: AC
  Administered 2020-06-17: 40 mg via INTRAVENOUS
  Filled 2020-06-17: qty 4

## 2020-06-17 MED ORDER — ALBUTEROL (5 MG/ML) CONTINUOUS INHALATION SOLN
5.0000 mg/h | INHALATION_SOLUTION | RESPIRATORY_TRACT | Status: DC
  Administered 2020-06-17: 5 mg/h via RESPIRATORY_TRACT
  Filled 2020-06-17 (×2): qty 20

## 2020-06-17 MED ORDER — SODIUM CHLORIDE 0.9% IV SOLUTION: Freq: Once | INTRAVENOUS | Status: AC

## 2020-06-17 MED ORDER — METHYLPREDNISOLONE SODIUM SUCC 125 MG IJ SOLR
125.0000 mg | Freq: Once | INTRAMUSCULAR | Status: AC
  Administered 2020-06-17: 125 mg via INTRAVENOUS
  Filled 2020-06-17: qty 2

## 2020-06-17 MED ORDER — IPRATROPIUM BROMIDE 0.02 % IN SOLN
0.5000 mg | Freq: Once | RESPIRATORY_TRACT | Status: AC
  Administered 2020-06-17: 0.5 mg via RESPIRATORY_TRACT
  Filled 2020-06-17: qty 2.5

## 2020-06-17 MED ORDER — SODIUM CHLORIDE 0.9 % IV SOLN
500.0000 mg | Freq: Once | INTRAVENOUS | Status: AC
  Administered 2020-06-18: 500 mg via INTRAVENOUS
  Filled 2020-06-17: qty 500

## 2020-06-17 NOTE — ED Provider Notes (Signed)
[facility] EMERGENCY DEPARTMENT Provider Note   CSN: [id number] Arrival date & time: 06/17/20  1936     History Chief Complaint  Patient presents with  . Shortness of Breath    David Short is a 50 y.o. male.  Patient is a 50 year old male with no reported medical conditions however smoking history who presents with shortness of breath.  Patient states that his symptoms started weeks ago.  He states that today he acutely had worsening shortness of breath.  He also endorses chest tightness and coughing.  Per EMS and only, patient had worsening mental status today which urged them to bring him to the emergency room.  Patient has had ongoing swelling of his lower extremities and upper extremities which is contributed to his symptoms.  Patient does not take any medications at home.  He states he does not have any medical conditions.  He does smoke 1 pack/day cigarettes.  Patient denies any lower extremity pain.  Patient denies any history of DVT.  Patient does report lower and upper extremity swelling for the past few weeks.  Patient denies any fevers or chills at home.  He reports no hemoptysis but does endorse yellow sputum.    No past medical history on file.  Patient Active Problem List   Diagnosis Date Noted  . Bacteremia due to Gram-negative bacteria 12/30/2018  . Vertebral osteomyelitis, acute-C5-C6 12/26/2018  . Neck pain 12/15/2018  . Malnutrition of moderate degree 12/15/2018  . S/P cervical spinal fusion 12/14/2018  . Aseptic meningitis 12/12/2018  . Accelerated hypertension 12/12/2018  . Sinus tachycardia 12/12/2018  . mild Thrombocytopenia (HCC) 12/12/2018    Past Surgical History:  Procedure Laterality Date  . ANTERIOR CERVICAL DECOMP/DISCECTOMY FUSION N/A 12/14/2018   Procedure: ANTERIOR CERVICAL DISCECTOMY FUSION CERVICAL FIVE- CERVICAL SIX;  Surgeon: Tia Alert, MD;  Location: [facility] OR;  Service: Neurosurgery;  Laterality: N/A;  . BACK  SURGERY     states never had surgery  . FRACTURE SURGERY Left    elbow/wrist/ankle       No family history on file.  Social History   Tobacco Use  . Smoking status: Current Every Day Smoker    Packs/day: 1.00    Types: Cigarettes  . Smokeless tobacco: Never Used  Vaping Use  . Vaping Use: Never used  Substance Use Topics  . Alcohol use: Not Currently  . Drug use: Not Currently    Types: IV    Home Medications Prior to Admission medications   Not on File    Allergies    Tylenol [acetaminophen]  Review of Systems   Review of Systems  Constitutional: Negative for chills and fever.  HENT: Negative for ear pain and sore throat.   Eyes: Negative for pain and visual disturbance.  Respiratory: Positive for cough, chest tightness and shortness of breath.   Cardiovascular: Positive for chest pain and leg swelling. Negative for palpitations.  Gastrointestinal: Negative for abdominal pain and vomiting.  Genitourinary: Negative for dysuria and hematuria.  Musculoskeletal: Negative for back pain and neck pain.  Skin: Negative for color change and wound.  Neurological: Negative for seizures and syncope.  All other systems reviewed and are negative.   Physical Exam Updated Vital Signs BP 129/85   Pulse 87   Temp (!) 97.2 F (36.2 C) (Axillary)   Resp 16   Ht 6' (1.829 m)   Wt 72.6 kg   SpO2 94%   BMI 21.70 kg/m   Physical Exam Vitals and  nursing note reviewed.  Constitutional:      General: He is in acute distress.     Appearance: He is toxic-appearing.  HENT:     Head: Normocephalic and atraumatic.  Eyes:     Conjunctiva/sclera: Conjunctivae normal.  Cardiovascular:     Rate and Rhythm: Regular rhythm. Tachycardia present.  Pulmonary:     Effort: Tachypnea, accessory muscle usage and respiratory distress present.     Breath sounds: Decreased breath sounds and wheezing present.  Abdominal:     Palpations: Abdomen is soft.     Tenderness: There is abdominal  tenderness.  Musculoskeletal:     Cervical back: Neck supple.     Right lower leg: No tenderness. Edema present.     Left lower leg: No tenderness. Edema present.  Skin:    General: Skin is warm and dry.  Neurological:     General: No focal deficit present.     Mental Status: He is alert. He is disoriented.     ED Results / Procedures / Treatments   Labs (all labs ordered are listed, but only abnormal results are displayed) Labs Reviewed  CBC WITH DIFFERENTIAL/PLATELET - Abnormal; Notable for the following components:      Result Value   RBC 3.83 (*)    Hemoglobin 6.4 (*)    HCT 29.4 (*)    MCV 76.8 (*)    MCH 16.7 (*)    MCHC 21.8 (*)    RDW 23.6 (*)    Platelets 127 (*)    nRBC 1.5 (*)    Abs Immature Granulocytes 0.59 (*)    All other components within normal limits  COMPREHENSIVE METABOLIC PANEL - Abnormal; Notable for the following components:   Sodium 134 (*)    Potassium 5.9 (*)    BUN 43 (*)    Creatinine, Ser 1.68 (*)    Calcium 8.2 (*)    Total Protein 8.2 (*)    Albumin 2.2 (*)    Total Bilirubin 1.4 (*)    GFR, Estimated 49 (*)    Anion gap 4 (*)    All other components within normal limits  BRAIN NATRIURETIC PEPTIDE - Abnormal; Notable for the following components:   B Natriuretic Peptide 2,239.0 (*)    All other components within normal limits  AMMONIA - Abnormal; Notable for the following components:   Ammonia 52 (*)    All other components within normal limits  RAPID URINE DRUG SCREEN, HOSP PERFORMED - Abnormal; Notable for the following components:   Opiates POSITIVE (*)    Cocaine POSITIVE (*)    All other components within normal limits  PROTIME-INR - Abnormal; Notable for the following components:   Prothrombin Time 17.2 (*)    INR 1.5 (*)    All other components within normal limits  URINALYSIS, ROUTINE W REFLEX MICROSCOPIC - Abnormal; Notable for the following components:   APPearance HAZY (*)    Hgb urine dipstick LARGE (*)    Protein,  ur 30 (*)    Bacteria, UA MANY (*)    All other components within normal limits  I-STAT VENOUS BLOOD GAS, ED - Abnormal; Notable for the following components:   pO2, Ven 188.0 (*)    Bicarbonate 28.9 (*)    Acid-Base Excess 3.0 (*)    Potassium 6.1 (*)    Calcium, Ion 1.06 (*)    HCT 27.0 (*)    Hemoglobin 9.2 (*)    All other components within normal limits  I-STAT ARTERIAL  BLOOD GAS, ED - Abnormal; Notable for the following components:   pH, Arterial 7.341 (*)    pCO2 arterial 52.4 (*)    pO2, Arterial 184 (*)    Bicarbonate 28.5 (*)    Potassium 5.8 (*)    HCT 25.0 (*)    Hemoglobin 8.5 (*)    All other components within normal limits  RESP PANEL BY RT-PCR (FLU A&B, COVID) ARPGX2  LACTIC ACID, PLASMA  BLOOD GAS, ARTERIAL  CBC WITH DIFFERENTIAL/PLATELET  TSH  TYPE AND SCREEN  PREPARE RBC (CROSSMATCH)  TROPONIN I (HIGH SENSITIVITY)    EKG EKG Interpretation  Date/Time:  Friday June 17 2020 19:38:52 EDT Ventricular Rate:  97 PR Interval:  179 QRS Duration: 118 QT Interval:  341 QTC Calculation: 434 R Axis:   -76 Text Interpretation: Sinus rhythm Incomplete right bundle branch block Inferior infarct, old When compared to prior, new t wave inversions in leads V3 andV4. No STEMI Confirmed by Theda Belfast ([id number]) on 06/17/2020 7:52:52 PM   Radiology CT Head Wo Contrast  Result Date: 06/17/2020 CLINICAL DATA:  Abnormal mental status, head trauma EXAM: CT HEAD WITHOUT CONTRAST TECHNIQUE: Contiguous axial images were obtained from the base of the skull through the vertex without intravenous contrast. COMPARISON:  MRI 12/13/2018 FINDINGS: Brain: No acute intracranial abnormality. Specifically, no hemorrhage, hydrocephalus, mass lesion, acute infarction, or significant intracranial injury. Vascular: No hyperdense vessel or unexpected calcification. Skull: No acute calvarial abnormality. Sinuses/Orbits: No acute findings. Mucosal thickening in the paranasal sinuses. Mastoid air  cells clear. Other: None IMPRESSION: No acute intracranial abnormality. Electronically Signed   By: Charlett Nose M.D.   On: 06/17/2020 22:48   CT Chest W Contrast  Result Date: 06/17/2020 CLINICAL DATA:  Shortness of breath for 2 weeks with altered mental status, swelling and edema in the upper and lower extremities, 50% on room air EXAM: CT CHEST, ABDOMEN, AND PELVIS WITH CONTRAST TECHNIQUE: Multidetector CT imaging of the chest, abdomen and pelvis was performed following the standard protocol during bolus administration of intravenous contrast. CONTRAST:  OMNIPAQUE IOHEXOL 300 MG/ML  SOLN COMPARISON:  Radiograph 06/17/2020 renal ultrasound 03/17/2012, CT 03/01/2012 FINDINGS: CT CHEST FINDINGS Cardiovascular: Borderline cardiomegaly with trace pericardial effusion. Extensive three-vessel coronary artery atherosclerosis. The aortic root is suboptimally assessed given cardiac pulsation artifact. Atherosclerotic plaque within the normal caliber aorta. No acute luminal abnormality of the imaged aorta. No periaortic stranding or hemorrhage. Normal 3 vessel branching of the aortic arch. Proximal great vessels are mildly calcified but otherwise unremarkable. Central pulmonary arteries are enlarged. No large central filling defects are evident within the limitations of this non tailored examination of the pulmonary arteries. Mediastinum/Nodes: Low-attenuation, borderline enlarged mediastinal and hilar lymph nodes are present including a 12 mm paratracheal lymph node (4/23), and 11 mm AP window lymph node (4/21) and a 12 mm left hilar lymph node (6/111). No acute abnormality of the trachea or esophagus. Thyroid gland and thoracic inlet are unremarkable. Lungs/Pleura: Large right and small left pleural effusion with areas of fairly uniformly enhancing atelectatic passive and dependent atelectasis though scattered fluid-filled and thickened airways are noted particularly towards the lung bases and some underlying  degree of aspiration is not excluded. No pneumothorax. No other focal airspace opacity. No concerning pulmonary nodules or masses. Musculoskeletal: No acute osseous abnormality or suspicious osseous lesion. CT ABDOMEN PELVIS FINDINGS Hepatobiliary: No concerning focal liver lesion. Diffuse periportal edema. Smooth liver surface contour. No significant gallbladder wall thickening. Pericholecystic fluid is likely related to redistributed ascites  throughout the abdomen. Pancreas: No pancreatic ductal dilatation or surrounding inflammatory changes. Spleen: Slightly heterogeneous enhancement of the spleen may be related to contrast timing. Splenomegaly is present. Measuring up to 18 cm in craniocaudal dimension. Normal splenic cleft. No concerning splenic lesion. Adrenals/Urinary Tract: Normal adrenal glands. Symmetrically delayed renal enhancement and excretion. No visible focal renal lesion. No urolithiasis or hydronephrosis. Urinary bladder is unremarkable for degree of distension. Stomach/Bowel: Stomach and duodenum unremarkable. Slightly edematous appearance of the distal small bowel may be related to presence of moderate volume ascites and presumed volume third-spacing. No colonic dilatation or wall thickening. No evidence of bowel obstruction. The appendix is not well visualized. Vascular/Lymphatic: Atherosclerotic calcifications within the abdominal aorta and branch vessels. No aneurysm or ectasia. No enlarged abdominopelvic lymph nodes. Edematous appearing mesenteric nodes as well as some borderline enlarged though low-attenuation bilateral inguinal nodes. No other conspicuous enlarged abdominopelvic nodes. Reproductive: Coarse eccentric calcification of the prostate. No concerning abnormalities of the prostate or seminal vesicles. A markedly edematous changes of the soft tissues of the included internal genitalia. Other: Moderate volume ascites with additional edematous changes throughout the mesentery.  Extensive circumferential body wall edema noted as well. Tiny fat containing umbilical hernia. No bowel containing hernias. Musculoskeletal: Bony fusion across the L1-2 L3 levels may reflect sequela of prior infection or injury. Given that this is a new finding since 2013. No other acute or conspicuous osseous abnormalities. Bones of the pelvis appear intact and congruent. IMPRESSION: 1. Extensive features of anasarca with circumferential body wall edema, pleural and pericardial effusions, periportal edema, moderate volume ascites and edematous changes of the mesentery. 2. Uniformly enhancing atelectatic passive and dependent atelectasis adjacent the basilar effusions though scattered fluid-filled and thickened airways are noted particularly towards the lung bases and some underlying degree of aspiration is not excluded. 3. Low-attenuation, borderline enlarged mediastinal hilar, and abdominopelvic nodes are nonspecific but could be reactive or edematous given features elsewhere though follow-up imaging may be warranted given the presence of splenomegaly as well. 4. Delayed renal enhancement bilaterally, nonspecific though should correlate for clinical of diminished renal function. 5. Bony fusion across the L1-2 L3 levels may reflect sequela of prior infection or injury. New finding since 2013. 6. Aortic Atherosclerosis (ICD10-I70.0). 7. Coronary artery calcifications are present. Please note that the presence of coronary artery calcium documents the presence of coronary artery disease, the severity of this disease and any potential stenosis cannot be assessed on this non-gated CT examination. Electronically Signed   By: Kreg Shropshire M.D.   On: 06/17/2020 23:07   CT ABDOMEN PELVIS W CONTRAST  Result Date: 06/17/2020 CLINICAL DATA:  Shortness of breath for 2 weeks with altered mental status, swelling and edema in the upper and lower extremities, 50% on room air EXAM: CT CHEST, ABDOMEN, AND PELVIS WITH CONTRAST  TECHNIQUE: Multidetector CT imaging of the chest, abdomen and pelvis was performed following the standard protocol during bolus administration of intravenous contrast. CONTRAST:  OMNIPAQUE IOHEXOL 300 MG/ML  SOLN COMPARISON:  Radiograph 06/17/2020 renal ultrasound 03/17/2012, CT 03/01/2012 FINDINGS: CT CHEST FINDINGS Cardiovascular: Borderline cardiomegaly with trace pericardial effusion. Extensive three-vessel coronary artery atherosclerosis. The aortic root is suboptimally assessed given cardiac pulsation artifact. Atherosclerotic plaque within the normal caliber aorta. No acute luminal abnormality of the imaged aorta. No periaortic stranding or hemorrhage. Normal 3 vessel branching of the aortic arch. Proximal great vessels are mildly calcified but otherwise unremarkable. Central pulmonary arteries are enlarged. No large central filling defects are evident within the  limitations of this non tailored examination of the pulmonary arteries. Mediastinum/Nodes: Low-attenuation, borderline enlarged mediastinal and hilar lymph nodes are present including a 12 mm paratracheal lymph node (4/23), and 11 mm AP window lymph node (4/21) and a 12 mm left hilar lymph node (6/111). No acute abnormality of the trachea or esophagus. Thyroid gland and thoracic inlet are unremarkable. Lungs/Pleura: Large right and small left pleural effusion with areas of fairly uniformly enhancing atelectatic passive and dependent atelectasis though scattered fluid-filled and thickened airways are noted particularly towards the lung bases and some underlying degree of aspiration is not excluded. No pneumothorax. No other focal airspace opacity. No concerning pulmonary nodules or masses. Musculoskeletal: No acute osseous abnormality or suspicious osseous lesion. CT ABDOMEN PELVIS FINDINGS Hepatobiliary: No concerning focal liver lesion. Diffuse periportal edema. Smooth liver surface contour. No significant gallbladder wall thickening.  Pericholecystic fluid is likely related to redistributed ascites throughout the abdomen. Pancreas: No pancreatic ductal dilatation or surrounding inflammatory changes. Spleen: Slightly heterogeneous enhancement of the spleen may be related to contrast timing. Splenomegaly is present. Measuring up to 18 cm in craniocaudal dimension. Normal splenic cleft. No concerning splenic lesion. Adrenals/Urinary Tract: Normal adrenal glands. Symmetrically delayed renal enhancement and excretion. No visible focal renal lesion. No urolithiasis or hydronephrosis. Urinary bladder is unremarkable for degree of distension. Stomach/Bowel: Stomach and duodenum unremarkable. Slightly edematous appearance of the distal small bowel may be related to presence of moderate volume ascites and presumed volume third-spacing. No colonic dilatation or wall thickening. No evidence of bowel obstruction. The appendix is not well visualized. Vascular/Lymphatic: Atherosclerotic calcifications within the abdominal aorta and branch vessels. No aneurysm or ectasia. No enlarged abdominopelvic lymph nodes. Edematous appearing mesenteric nodes as well as some borderline enlarged though low-attenuation bilateral inguinal nodes. No other conspicuous enlarged abdominopelvic nodes. Reproductive: Coarse eccentric calcification of the prostate. No concerning abnormalities of the prostate or seminal vesicles. A markedly edematous changes of the soft tissues of the included internal genitalia. Other: Moderate volume ascites with additional edematous changes throughout the mesentery. Extensive circumferential body wall edema noted as well. Tiny fat containing umbilical hernia. No bowel containing hernias. Musculoskeletal: Bony fusion across the L1-2 L3 levels may reflect sequela of prior infection or injury. Given that this is a new finding since 2013. No other acute or conspicuous osseous abnormalities. Bones of the pelvis appear intact and congruent. IMPRESSION: 1.  Extensive features of anasarca with circumferential body wall edema, pleural and pericardial effusions, periportal edema, moderate volume ascites and edematous changes of the mesentery. 2. Uniformly enhancing atelectatic passive and dependent atelectasis adjacent the basilar effusions though scattered fluid-filled and thickened airways are noted particularly towards the lung bases and some underlying degree of aspiration is not excluded. 3. Low-attenuation, borderline enlarged mediastinal hilar, and abdominopelvic nodes are nonspecific but could be reactive or edematous given features elsewhere though follow-up imaging may be warranted given the presence of splenomegaly as well. 4. Delayed renal enhancement bilaterally, nonspecific though should correlate for clinical of diminished renal function. 5. Bony fusion across the L1-2 L3 levels may reflect sequela of prior infection or injury. New finding since 2013. 6. Aortic Atherosclerosis (ICD10-I70.0). 7. Coronary artery calcifications are present. Please note that the presence of coronary artery calcium documents the presence of coronary artery disease, the severity of this disease and any potential stenosis cannot be assessed on this non-gated CT examination. Electronically Signed   By: Kreg Shropshire M.D.   On: 06/17/2020 23:07   DG Chest Portable 1 View  Result Date: 06/17/2020 CLINICAL DATA:  Shortness of breath EXAM: PORTABLE CHEST 1 VIEW COMPARISON:  12/12/2018 FINDINGS: Shallow inspiration. Small to moderate right pleural effusion with consolidation in the right lung base. Linear atelectasis or infiltration in the left base. Cardiac enlargement. No vascular congestion. Calcification of the aorta. IMPRESSION: Small to moderate right pleural effusion with consolidation in the right lung base. Linear atelectasis or infiltration in the left base. Electronically Signed   By: Burman Nieves M.D.   On: 06/17/2020 20:18    Procedures Procedures   Medications  Ordered in ED Medications  albuterol (PROVENTIL,VENTOLIN) solution continuous neb (5 mg/hr Nebulization New Bag/Given 06/17/20 2024)  0.9 %  sodium chloride infusion (Manually program via Guardrails IV Fluids) (has no administration in time range)  cefTRIAXone (ROCEPHIN) 1 g in sodium chloride 0.9 % 100 mL IVPB (has no administration in time range)  azithromycin (ZITHROMAX) 500 mg in sodium chloride 0.9 % 250 mL IVPB (has no administration in time range)  ipratropium (ATROVENT) nebulizer solution 0.5 mg (0.5 mg Nebulization Given 06/17/20 2025)  methylPREDNISolone sodium succinate (SOLU-MEDROL) 125 mg/2 mL injection 125 mg (125 mg Intravenous Given 06/17/20 2010)  magnesium sulfate IVPB 2 g 50 mL (0 g Intravenous Stopped 06/17/20 2213)  furosemide (LASIX) injection 40 mg (40 mg Intravenous Given 06/17/20 2034)  iohexol (OMNIPAQUE) 300 MG/ML solution 100 mL (100 mLs Intravenous Contrast Given 06/17/20 2234)    ED Course  I have reviewed the triage vital signs and the nursing notes.  Pertinent labs & imaging results that were available during my care of the patient were reviewed by me and considered in my medical decision making (see chart for details).    MDM Rules/Calculators/A&P                          50 year old male with no known medical conditions who presents in respiratory distress.  Arrives on nonrebreather saturating appropriately.  He is tachypneic.  He is using accessory muscles to breathe.  He appears critically ill.  On exam, patient with diffuse wheezing throughout lung fields and notable decreased breath sounds.  In addition, patient has bilateral lower and upper extremity pitting edema.  Patient appears altered.  He is mildly disoriented.  Immediately initiated continuous albuterol with atrovent, IV magnesium, and 125 solumedrol for COPD treatment given smoking history and wheezing on exam. Given patient's evidence for volume overload on examination and with increase heart size on CXR  in comparison to one year prior, given 40 IV lasix for concerns for acute heart failure.   Broad work-up initiated to find source of shortness of breath. I stat blood gas with normal pH and no evidence for hypercarbia. BNP elevated at 2,239. Ammonia 52. INR elevated. HgB 6.4. Patient consented for and ordered blood. AKI present.   Given abdominal tenderness & unknown etiology of consolidation and pleural effusion, with concerns for hemothorax, infectious cause, vs oncologic cause - will order CT chest, abdomen, pelvis to evaluate.  CT did not show evidence for trauma or new mass. Does show evidence for diffuse anasarca with pleural effusion, pericardial effusion, periportal edema, and ascites. Overall, patient with evidence for multi-organ failure.   On re-evaluation patient's clinical status worsening. Continuing to be hypoxic with worsening tachypnea on NRB. Initiated BiPAP for positive pressure support. Critical care medicine consulted for admission.  Patient to be admitted to critical care for further workup.   Final Clinical Impression(s) / ED Diagnoses Final diagnoses:  Hypoxia  Shortness of breath  AKI (acute kidney injury) [facility])    Rx / DC Orders ED Discharge Orders    None       Jerzey Komperda, [place], MD 06/18/20 0018    Tegeler, Canary Brim, MD 06/18/20 0024    Tegeler, Canary Brim, MD 07/04/20 1416

## 2020-06-17 NOTE — ED Triage Notes (Signed)
Pt bib gems from home c/o SOB x2 weeks and AMS starting today. Pt family reports swelling and edema in upper and lower extremities worsening over the past couple weeks. Pt at 54% on RA on EMS arrival - placed on NRB and sats went up to 96%. No hx of CHF.   BP: 100/70  HR: 102 Temp: 98.6  RR: 30

## 2020-06-17 NOTE — ED Notes (Signed)
Patient transported to MRI 

## 2020-06-18 ENCOUNTER — Inpatient Hospital Stay (HOSPITAL_COMMUNITY): Payer: Medicaid Other

## 2020-06-18 DIAGNOSIS — I5023 Acute on chronic systolic (congestive) heart failure: Secondary | ICD-10-CM | POA: Diagnosis present

## 2020-06-18 DIAGNOSIS — F17210 Nicotine dependence, cigarettes, uncomplicated: Secondary | ICD-10-CM | POA: Diagnosis present

## 2020-06-18 DIAGNOSIS — I4510 Unspecified right bundle-branch block: Secondary | ICD-10-CM | POA: Diagnosis present

## 2020-06-18 DIAGNOSIS — R188 Other ascites: Secondary | ICD-10-CM | POA: Diagnosis present

## 2020-06-18 DIAGNOSIS — R0602 Shortness of breath: Secondary | ICD-10-CM | POA: Diagnosis present

## 2020-06-18 DIAGNOSIS — Z681 Body mass index (BMI) 19 or less, adult: Secondary | ICD-10-CM | POA: Diagnosis not present

## 2020-06-18 DIAGNOSIS — E8809 Other disorders of plasma-protein metabolism, not elsewhere classified: Secondary | ICD-10-CM | POA: Diagnosis present

## 2020-06-18 DIAGNOSIS — F1123 Opioid dependence with withdrawal: Secondary | ICD-10-CM | POA: Diagnosis present

## 2020-06-18 DIAGNOSIS — K7460 Unspecified cirrhosis of liver: Secondary | ICD-10-CM | POA: Diagnosis present

## 2020-06-18 DIAGNOSIS — I509 Heart failure, unspecified: Secondary | ICD-10-CM

## 2020-06-18 DIAGNOSIS — J9602 Acute respiratory failure with hypercapnia: Secondary | ICD-10-CM | POA: Diagnosis present

## 2020-06-18 DIAGNOSIS — D509 Iron deficiency anemia, unspecified: Secondary | ICD-10-CM | POA: Diagnosis present

## 2020-06-18 DIAGNOSIS — Z20822 Contact with and (suspected) exposure to covid-19: Secondary | ICD-10-CM | POA: Diagnosis present

## 2020-06-18 DIAGNOSIS — J9601 Acute respiratory failure with hypoxia: Secondary | ICD-10-CM | POA: Diagnosis present

## 2020-06-18 DIAGNOSIS — J441 Chronic obstructive pulmonary disease with (acute) exacerbation: Secondary | ICD-10-CM | POA: Diagnosis present

## 2020-06-18 DIAGNOSIS — I110 Hypertensive heart disease with heart failure: Secondary | ICD-10-CM | POA: Diagnosis present

## 2020-06-18 DIAGNOSIS — R0603 Acute respiratory distress: Secondary | ICD-10-CM

## 2020-06-18 DIAGNOSIS — D696 Thrombocytopenia, unspecified: Secondary | ICD-10-CM | POA: Diagnosis present

## 2020-06-18 DIAGNOSIS — I517 Cardiomegaly: Secondary | ICD-10-CM

## 2020-06-18 DIAGNOSIS — R161 Splenomegaly, not elsewhere classified: Secondary | ICD-10-CM | POA: Diagnosis present

## 2020-06-18 DIAGNOSIS — Z981 Arthrodesis status: Secondary | ICD-10-CM | POA: Diagnosis not present

## 2020-06-18 DIAGNOSIS — N179 Acute kidney failure, unspecified: Secondary | ICD-10-CM | POA: Diagnosis present

## 2020-06-18 DIAGNOSIS — E43 Unspecified severe protein-calorie malnutrition: Secondary | ICD-10-CM | POA: Diagnosis present

## 2020-06-18 DIAGNOSIS — D638 Anemia in other chronic diseases classified elsewhere: Secondary | ICD-10-CM | POA: Diagnosis present

## 2020-06-18 DIAGNOSIS — Z886 Allergy status to analgesic agent status: Secondary | ICD-10-CM | POA: Diagnosis not present

## 2020-06-18 DIAGNOSIS — D689 Coagulation defect, unspecified: Secondary | ICD-10-CM | POA: Diagnosis present

## 2020-06-18 DIAGNOSIS — K5900 Constipation, unspecified: Secondary | ICD-10-CM | POA: Diagnosis not present

## 2020-06-18 DIAGNOSIS — K766 Portal hypertension: Secondary | ICD-10-CM | POA: Diagnosis present

## 2020-06-18 LAB — ECHOCARDIOGRAM COMPLETE
Area-P 1/2: 3.48 cm2
Height: 72 in
S' Lateral: 2.3 cm
Weight: 2656.1 oz

## 2020-06-18 LAB — HIV ANTIBODY (ROUTINE TESTING W REFLEX): HIV Screen 4th Generation wRfx: NONREACTIVE

## 2020-06-18 LAB — CBC WITH DIFFERENTIAL/PLATELET
Abs Immature Granulocytes: 0.62 10*3/uL — ABNORMAL HIGH (ref 0.00–0.07)
Basophils Absolute: 0 10*3/uL (ref 0.0–0.1)
Basophils Relative: 0 %
Eosinophils Absolute: 0 10*3/uL (ref 0.0–0.5)
Eosinophils Relative: 0 %
HCT: 29.4 % — ABNORMAL LOW (ref 39.0–52.0)
Hemoglobin: 6.9 g/dL — CL (ref 13.0–17.0)
Immature Granulocytes: 8 %
Lymphocytes Relative: 4 %
Lymphs Abs: 0.3 10*3/uL — ABNORMAL LOW (ref 0.7–4.0)
MCH: 17.9 pg — ABNORMAL LOW (ref 26.0–34.0)
MCHC: 23.5 g/dL — ABNORMAL LOW (ref 30.0–36.0)
MCV: 76.4 fL — ABNORMAL LOW (ref 80.0–100.0)
Monocytes Absolute: 0.1 10*3/uL (ref 0.1–1.0)
Monocytes Relative: 1 %
Neutro Abs: 7.2 10*3/uL (ref 1.7–7.7)
Neutrophils Relative %: 87 %
Platelets: 101 10*3/uL — ABNORMAL LOW (ref 150–400)
RBC: 3.85 MIL/uL — ABNORMAL LOW (ref 4.22–5.81)
RDW: 23.2 % — ABNORMAL HIGH (ref 11.5–15.5)
WBC: 8.2 10*3/uL (ref 4.0–10.5)
nRBC: 1.3 % — ABNORMAL HIGH (ref 0.0–0.2)

## 2020-06-18 LAB — URINALYSIS, ROUTINE W REFLEX MICROSCOPIC
Bilirubin Urine: NEGATIVE
Glucose, UA: NEGATIVE mg/dL
Ketones, ur: NEGATIVE mg/dL
Leukocytes,Ua: NEGATIVE
Nitrite: NEGATIVE
Protein, ur: 30 mg/dL — AB
Specific Gravity, Urine: 1.01 (ref 1.005–1.030)
pH: 5 (ref 5.0–8.0)

## 2020-06-18 LAB — PROCALCITONIN: Procalcitonin: 1.28 ng/mL

## 2020-06-18 LAB — COMPREHENSIVE METABOLIC PANEL
ALT: 14 U/L (ref 0–44)
AST: 27 U/L (ref 15–41)
Albumin: 1.9 g/dL — ABNORMAL LOW (ref 3.5–5.0)
Alkaline Phosphatase: 87 U/L (ref 38–126)
Anion gap: 9 (ref 5–15)
BUN: 41 mg/dL — ABNORMAL HIGH (ref 6–20)
CO2: 22 mmol/L (ref 22–32)
Calcium: 7.9 mg/dL — ABNORMAL LOW (ref 8.9–10.3)
Chloride: 100 mmol/L (ref 98–111)
Creatinine, Ser: 1.61 mg/dL — ABNORMAL HIGH (ref 0.61–1.24)
GFR, Estimated: 52 mL/min — ABNORMAL LOW (ref >60)
Glucose, Bld: 223 mg/dL — ABNORMAL HIGH (ref 70–99)
Potassium: 5.6 mmol/L — ABNORMAL HIGH (ref 3.5–5.1)
Sodium: 131 mmol/L — ABNORMAL LOW (ref 135–145)
Total Bilirubin: 1 mg/dL (ref 0.3–1.2)
Total Protein: 7.4 g/dL (ref 6.5–8.1)

## 2020-06-18 LAB — TSH: TSH: 17.287 u[IU]/mL — ABNORMAL HIGH (ref 0.350–4.500)

## 2020-06-18 LAB — T4, FREE: Free T4: 0.77 ng/dL (ref 0.61–1.12)

## 2020-06-18 LAB — CREATININE, SERUM
Creatinine, Ser: 1.57 mg/dL — ABNORMAL HIGH (ref 0.61–1.24)
GFR, Estimated: 53 mL/min — ABNORMAL LOW (ref >60)

## 2020-06-18 MED ORDER — IPRATROPIUM-ALBUTEROL 0.5-2.5 (3) MG/3ML IN SOLN: 3.0000 mL | RESPIRATORY_TRACT | Status: DC | PRN

## 2020-06-18 MED ORDER — PANTOPRAZOLE SODIUM 40 MG PO TBEC
40.0000 mg | DELAYED_RELEASE_TABLET | Freq: Every day | ORAL | Status: DC
  Administered 2020-06-18 – 2020-06-26 (×9): 40 mg via ORAL
  Filled 2020-06-18 (×9): qty 1

## 2020-06-18 MED ORDER — OXYCODONE HCL 5 MG PO TABS
10.0000 mg | ORAL_TABLET | ORAL | Status: DC | PRN
  Administered 2020-06-18 – 2020-06-26 (×44): 10 mg via ORAL
  Filled 2020-06-18 (×45): qty 2

## 2020-06-18 MED ORDER — DOCUSATE SODIUM 100 MG PO CAPS: 100.0000 mg | ORAL_CAPSULE | Freq: Two times a day (BID) | ORAL | Status: DC | PRN

## 2020-06-18 MED ORDER — FUROSEMIDE 10 MG/ML IJ SOLN
40.0000 mg | Freq: Two times a day (BID) | INTRAMUSCULAR | Status: DC
  Administered 2020-06-18 – 2020-06-21 (×7): 40 mg via INTRAVENOUS
  Filled 2020-06-18 (×7): qty 4

## 2020-06-18 MED ORDER — POLYETHYLENE GLYCOL 3350 17 G PO PACK
17.0000 g | PACK | Freq: Every day | ORAL | Status: DC | PRN
Start: 2020-06-18 — End: 2020-06-26

## 2020-06-18 MED ORDER — ONDANSETRON HCL 4 MG/2ML IJ SOLN
4.0000 mg | Freq: Four times a day (QID) | INTRAMUSCULAR | Status: DC | PRN
  Administered 2020-06-18: 4 mg via INTRAVENOUS
  Filled 2020-06-18: qty 2

## 2020-06-18 MED ORDER — CHLORHEXIDINE GLUCONATE CLOTH 2 % EX PADS
6.0000 | MEDICATED_PAD | Freq: Every day | CUTANEOUS | Status: DC
  Administered 2020-06-18 – 2020-06-25 (×7): 6 via TOPICAL

## 2020-06-18 MED ORDER — HEPARIN SODIUM (PORCINE) 5000 UNIT/ML IJ SOLN
5000.0000 [IU] | Freq: Three times a day (TID) | INTRAMUSCULAR | Status: DC
  Administered 2020-06-18: 5000 [IU] via SUBCUTANEOUS
  Filled 2020-06-18: qty 1

## 2020-06-18 NOTE — Progress Notes (Signed)
eLink Physician-Brief Progress Note Patient Name: David Short DOB: 01-30-1971 MRN: [id number]   Date of Service  06/18/2020  HPI/Events of Note  58 M smoker, history of vertebral oseomyelitis, presented with 2 week history of shortness of breath, cough and chest tightness and started to have altered sensorium today. Family reports swelling of all extremities for several weeks. Hypoxic on presentation placed on 100% NRB mask and now on Bipap 10/5 50%.  Initiated on COPD treatment in the the ED as wit wheezing on exam. Also given lasix for anasarca and elevated BNP.  Creatinine 1.63 K 5.9 TSH 17.28  eICU Interventions   Acute on chronic hypercapnea and hypoxemia with pleural and pericardial effusion with extensive coronary calcification. 2D Echo pending.  Hypothyroidism likely playing a significant role in this patient's clinical picture.         Darl Pikes 06/18/2020, 3:23 AM

## 2020-06-18 NOTE — Progress Notes (Signed)
NAME:  David Short, MRN:  [id number], DOB:  [birth year], LOS: 0 ADMISSION DATE:  06/17/2020, CONSULTATION DATE:  06/17/20  REFERRING MD: Girard Cooter , CHIEF COMPLAINT:  Dyspnea on exertion  History of Present Illness:  David Short is a 50 year old man with a history of vertebral osteomyelitis, anterior cervical discectomy,  cervical spinal fusion, Reported HX of HTN per chart, current smoker, chronic back pain, here with dyspnea for the past several weeks.  Mostly he notices dyspnea on exertion.  No dyspnea on talking.  Worsening dyspnea x 2 weeks. He also endorses orthopnea, but he is not sure of the duration (at least months).  Lower extremity edema to the knees for the past two weeks.   In the ED she was treated for COPD exacerbation, as well as volume overload 2/2 CHF.  Found to have a R sided pleural effusion, some atelectasis, pulm edema, ascites, anasarca.  Was given methylprednisolone, duonebs, lasix 40mg  IV, ceftriaxone and azithromycin.   Was placed on bipap due to worsening hypoxemia despite NRB, increasing work of breaking.   Pertinent  Medical History   . ANTERIOR CERVICAL DECOMP/DISCECTOMY FUSION N/A 12/14/2018   Procedure: ANTERIOR CERVICAL DISCECTOMY FUSION CERVICAL FIVE- CERVICAL SIX;  Surgeon: 12/16/2018, MD;  [facility]: [facility];  Service: Neurosurgery;  Laterality: N/A;  . BACK SURGERY     states never had surgery  . FRACTURE SURGERY Left    elbow/wrist/ankle    Significant Hospital Events: Including procedures, antibiotic start and stop dates in addition to other pertinent events   .   Interim History / Subjective:   Hungry, would like to eat breakfast. having pain. Was on bipap most of the night and would like to take a break. Has had uop but not accurately counted due to losing some in the sheets.   Objective   Blood pressure 114/74, pulse 91, temperature 98.1 F (36.7 C), temperature source Axillary, resp. rate 12, height 6' (1.829 m), weight 75.3 kg, SpO2  92 %.        Intake/Output Summary (Last 24 hours) at 06/18/2020 1019 Last data filed at 06/18/2020 0330 Gross per 24 hour  Intake 986 ml  Output --  Net 986 ml   Filed Weights   06/17/20 1938 06/18/20 0230  Weight: 72.6 kg 75.3 kg    Examination: General:pleasant, breathing comfortably on bipap HENT: NCAT, elevated JVP Lungs: bilateral rhonchi with end expiratory wheezes, bases diminished. Cardiovascular: tachycardic, regular Abdomen: distended, soft Extremities: bilateral pitting edema, scrotal edema, track marks Neuro:  Awake alert oriented, non focal neuro exam  Bedside echo: reduced EF, RV and LV dilated.  No effusion.  Small/moderate pleural effusion on R.   Labs/imaging that I havepersonally reviewed  (right click and "Reselect all SmartList Selections" daily)  CBC, Chem panel, BNP, CXR , CT head , CT abd and chest    Resolved Hospital Problem list     Assessment & Plan:   David Short is a 50 y.o. gentlemen with chronic pain and recent IVDU who presents with  Acute decompensation of suspected Systolic heart failure Will diurese with IV lasix 40 mg IV BID Monitor electrolytes, Goal K>4, Mg>2 Monitor I and O Echocardiogram pending  Acute hypoxemic and hypercapnic respiratory failure Secondary to above, with bilateral pleural effusions Diurese, nocturnal bipap and prn during the day for dyspnea  Chronic cervical/back pain:  Intermittently takes oxycodone. 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 Occasional aleve.     Best practice (right click  and "Reselect all SmartList Selections" daily)  Diet:  Oral Pain/Anxiety/Delirium protocol (if indicated): No VAP protocol (if indicated): Not indicated DVT prophylaxis: Subcutaneous Heparin GI prophylaxis: PPI Glucose control:  SSI No Central venous access:  N/A Arterial line:  N/A Foley:  N/A Mobility:  bed rest  PT consulted: Yes Last date of multidisciplinary goals of care discussion []  Code Status:   full code Disposition: ICU.   The patient is critically ill due to respiratory failure.  Critical care was necessary to treat or prevent imminent or life-threatening deterioration.  Critical care was time spent personally by me on the following activities: development of treatment plan with patient and/or surrogate as well as nursing, discussions with consultants, evaluation of patient's response to treatment, examination of patient, obtaining history from patient or surrogate, ordering and performing treatments and interventions, ordering and review of laboratory studies, ordering and review of radiographic studies, pulse oximetry, re-evaluation of patient's condition and participation in multidisciplinary rounds.   Critical Care Time devoted to patient care services described in this note is 35 minutes. This time reflects time of care of this signee . This critical care time does not reflect separately billable procedures or procedure time, teaching time or supervisory time of PA/NP/Med student/Med Resident etc but could involve care discussion time.       Charlott Holler [facility] 06/18/2020 10:20 AM  Pager: see AMION  If no response to pager , please call critical care on call (see AMION) until 7pm After 7:00 pm call Elink      Labs   CBC: Recent Labs  Lab 06/17/20 1948 06/17/20 2006 06/17/20 2356 06/18/20 0642  WBC 9.2  --   --  8.2  NEUTROABS 6.7  --   --  7.2  HGB 6.4* 9.2* 8.5* 6.9*  HCT 29.4* 27.0* 25.0* 29.4*  MCV 76.8*  --   --  76.4*  PLT 127*  --   --  101*    Basic Metabolic Panel: Recent Labs  Lab 06/17/20 1948 06/17/20 2006 06/17/20 2356 06/18/20 0642  NA 134* 136 135  --   K 5.9* 6.1* 5.8*  --   CL 104  --   --   --   CO2 26  --   --   --   GLUCOSE 93  --   --   --   BUN 43*  --   --   --   CREATININE 1.68*  --   --  1.57*  CALCIUM 8.2*  --   --   --    GFR: Estimated Creatinine Clearance: 60 mL/min (A)  (by C-G formula based on SCr of 1.57 mg/dL (H)). Recent Labs  Lab 06/17/20 1948 06/18/20 0642  WBC 9.2 8.2  LATICACIDVEN 1.4  --     Liver Function Tests: Recent Labs  Lab 06/17/20 1948  AST 34  ALT 15  ALKPHOS 105  BILITOT 1.4*  PROT 8.2*  ALBUMIN 2.2*   No results for input(s): LIPASE, AMYLASE in the last 168 hours. Recent Labs  Lab 06/17/20 1958  AMMONIA 52*    ABG    Component Value Date/Time   PHART 7.341 (L) 06/17/2020 2356   PCO2ART 52.4 (H) 06/17/2020 2356   PO2ART 184 (H) 06/17/2020 2356   HCO3 28.5 (H) 06/17/2020 2356   TCO2 30 06/17/2020 2356   O2SAT 100.0 06/17/2020 2356     Coagulation Profile: Recent Labs  Lab 06/17/20 2101  INR  1.5*    Cardiac Enzymes: No results for input(s): CKTOTAL, CKMB, CKMBINDEX, TROPONINI in the last 168 hours.  HbA1C: No results found for: HGBA1C  CBG: No results for input(s): GLUCAP in the last 168 hours.

## 2020-06-18 NOTE — H&P (Signed)
NAME:  David Short, MRN:  [id number], DOB:  [birth year], LOS: 0 ADMISSION DATE:  06/17/2020, CONSULTATION DATE:  06/17/20  REFERRING MD: Girard Cooter , CHIEF COMPLAINT:  Dyspnea on exertion  History of Present Illness:  [name] is a 50 year old man with a history of vertebral osteomyelitis, anterior cervical discectomy,  cervical spinal fusion, Reported HX of HTN per chart, current smoker, chronic back pain, here with dyspnea for the past several weeks.  Mostly he notices dyspnea on exertion.  No dyspnea on talking.  Worsening dyspnea x 2 weeks. He also endorses orthopnea, but he is not sure of the duration (at least months).  Lower extremity edema to the knees for the past two weeks.   In the ED she was treated for COPD exacerbation, as well as volume overload 2/2 CHF.  Found to have a R sided pleural effusion, some atelectasis, pulm edema, ascites, anasarca.  Was given methylprednisolone, duonebs, lasix 40mg  IV, ceftriaxone and azithromycin.   Was placed on bipap due to worsening hypoxemia despite NRB, increasing work of breaking.   Pertinent  Medical History   . ANTERIOR CERVICAL DECOMP/DISCECTOMY FUSION N/A 12/14/2018   Procedure: ANTERIOR CERVICAL DISCECTOMY FUSION CERVICAL FIVE- CERVICAL SIX;  Surgeon: 12/16/2018, MD;  Location: [facility] OR;  Service: Neurosurgery;  Laterality: N/A;  . BACK SURGERY     states never had surgery  . FRACTURE SURGERY Left    elbow/wrist/ankle    Significant Hospital Events: Including procedures, antibiotic start and stop dates in addition to other pertinent events   .   Interim History / Subjective:    Objective   Blood pressure 110/65, pulse 86, temperature (!) 97.2 F (36.2 C), temperature source Axillary, resp. rate 10, height 6' (1.829 m), weight 72.6 kg, SpO2 95 %.       No intake or output data in the 24 hours ending 06/18/20 0049 Filed Weights   06/17/20 1938  Weight: 72.6 kg    Examination: General:pleasant, breathing  comfortably on bipap HENT: NCAT, elevated JVP Lungs: crackles B, intermittent wheezes, decreased BS on R base.  Cardiovascular: RRR no mgr Abdomen: NT, ND, NBS Extremities: poor hygeine (has been unable to bend over to clean), 2-3+ pitting edema up to the knees B, no erythema.   Neuro:  Awake alert oriented, non focal neuro exam  Bedside echo: reduced EF, RV and LV dilated.  No effusion.  Small/moderate pleural effusion on R.   Labs/imaging that I havepersonally reviewed  (right click and "Reselect all SmartList Selections" daily)  CBC, Chem panel, BNP, CXR , CT head , CT abd and chest    Resolved Hospital Problem list     Assessment & Plan:  Dyspnea, Dyspnea on exertion, orthopnea: All likely 2/2 CHF.  Lasix given.  Monitor ins and outs closely.  Already improved with some diuresis. Cont bipap for tonight, trial off bipap after diuresis.  Formal echo pending.  Trop normal.  BNP elevated.  Hold off on thoracentesis, likely 2/2 CHF, first try diuresis.   No leukocytosis, no cough, no pleurisy.  Will cont duonebs, but hold off on further steroids or antibiotics for now.   Chronic cervical/back pain: only intermittently takes oxycodone.  Occasional aleve.     Best practice (right click and "Reselect all SmartList Selections" daily)  Diet:  Oral Pain/Anxiety/Delirium protocol (if indicated): No VAP protocol (if indicated): Not indicated DVT prophylaxis: Subcutaneous Heparin GI prophylaxis: PPI Glucose control:  SSI No Central venous access:  N/A Arterial line:  N/A Foley:  N/A Mobility:  bed rest  PT consulted: Yes Last date of multidisciplinary goals of care discussion []  Code Status:  full code Disposition: ICU.  Mom present at bedside.   Labs   CBC: Recent Labs  Lab 06/17/20 1948 06/17/20 2006 06/17/20 2356  WBC 9.2  --   --   NEUTROABS 6.7  --   --   HGB 6.4* 9.2* 8.5*  HCT 29.4* 27.0* 25.0*  MCV 76.8*  --   --   PLT 127*  --   --     Basic Metabolic  Panel: Recent Labs  Lab 06/17/20 1948 06/17/20 2006 06/17/20 2356  NA 134* 136 135  K 5.9* 6.1* 5.8*  CL 104  --   --   CO2 26  --   --   GLUCOSE 93  --   --   BUN 43*  --   --   CREATININE 1.68*  --   --   CALCIUM 8.2*  --   --    GFR: Estimated Creatinine Clearance: 54 mL/min (A) (by C-G formula based on SCr of 1.68 mg/dL (H)). Recent Labs  Lab 06/17/20 1948  WBC 9.2  LATICACIDVEN 1.4    Liver Function Tests: Recent Labs  Lab 06/17/20 1948  AST 34  ALT 15  ALKPHOS 105  BILITOT 1.4*  PROT 8.2*  ALBUMIN 2.2*   No results for input(s): LIPASE, AMYLASE in the last 168 hours. Recent Labs  Lab 06/17/20 1958  AMMONIA 52*    ABG    Component Value Date/Time   PHART 7.341 (L) 06/17/2020 2356   PCO2ART 52.4 (H) 06/17/2020 2356   PO2ART 184 (H) 06/17/2020 2356   HCO3 28.5 (H) 06/17/2020 2356   TCO2 30 06/17/2020 2356   O2SAT 100.0 06/17/2020 2356     Coagulation Profile: Recent Labs  Lab 06/17/20 2101  INR 1.5*    Cardiac Enzymes: No results for input(s): CKTOTAL, CKMB, CKMBINDEX, TROPONINI in the last 168 hours.  HbA1C: No results found for: HGBA1C  CBG: No results for input(s): GLUCAP in the last 168 hours.  Review of Systems:   Review of Systems  Constitutional: Negative for chills, diaphoresis, fever, malaise/fatigue and weight loss.  HENT: Negative for congestion, ear pain, hearing loss, sinus pain and sore throat.   Eyes: Negative for blurred vision.  Respiratory: Positive for shortness of breath and wheezing. Negative for cough, hemoptysis and sputum production.   Cardiovascular: Positive for orthopnea and leg swelling. Negative for chest pain, palpitations and claudication.  Gastrointestinal: Negative for constipation, diarrhea and heartburn.  Genitourinary: Negative for dysuria.  Musculoskeletal: Negative for myalgias.  Skin: Negative for rash.  Neurological: Negative for dizziness and headaches.  Endo/Heme/Allergies: Negative for  polydipsia.  Psychiatric/Behavioral: Negative for depression.     Past Medical History:  He,  has no past medical history on file.   Surgical History:   Past Surgical History:  Procedure Laterality Date  . ANTERIOR CERVICAL DECOMP/DISCECTOMY FUSION N/A 12/14/2018   Procedure: ANTERIOR CERVICAL DISCECTOMY FUSION CERVICAL FIVE- CERVICAL SIX;  Surgeon: 12/16/2018, MD;  Location: [facility] OR;  Service: Neurosurgery;  Laterality: N/A;  . BACK SURGERY     states never had surgery  . FRACTURE SURGERY Left    elbow/wrist/ankle     Social History:   reports that he has been smoking cigarettes. He has been smoking about 1.00 pack per day. He has never used smokeless tobacco. He reports previous alcohol use. He reports previous drug  use. Drug: IV.   Family History:  His family history is not on file.   Allergies Allergies  Allergen Reactions  . Tylenol [Acetaminophen] Hives     Home Medications  Prior to Admission medications   Not on File     Critical care time: 45 minutes

## 2020-06-18 NOTE — Progress Notes (Signed)
  Echocardiogram 2D Echocardiogram has been performed.  David Short 06/18/2020, 4:15 PM

## 2020-06-19 ENCOUNTER — Encounter (HOSPITAL_COMMUNITY): Payer: Self-pay | Admitting: Pulmonary Disease

## 2020-06-19 ENCOUNTER — Other Ambulatory Visit: Payer: Self-pay

## 2020-06-19 DIAGNOSIS — N179 Acute kidney failure, unspecified: Secondary | ICD-10-CM

## 2020-06-19 DIAGNOSIS — F1990 Other psychoactive substance use, unspecified, uncomplicated: Secondary | ICD-10-CM

## 2020-06-19 DIAGNOSIS — E875 Hyperkalemia: Secondary | ICD-10-CM

## 2020-06-19 DIAGNOSIS — R0602 Shortness of breath: Secondary | ICD-10-CM

## 2020-06-19 DIAGNOSIS — I5043 Acute on chronic combined systolic (congestive) and diastolic (congestive) heart failure: Secondary | ICD-10-CM

## 2020-06-19 LAB — BASIC METABOLIC PANEL
Anion gap: 3 — ABNORMAL LOW (ref 5–15)
BUN: 44 mg/dL — ABNORMAL HIGH (ref 6–20)
CO2: 34 mmol/L — ABNORMAL HIGH (ref 22–32)
Calcium: 8.1 mg/dL — ABNORMAL LOW (ref 8.9–10.3)
Chloride: 99 mmol/L (ref 98–111)
Creatinine, Ser: 1.52 mg/dL — ABNORMAL HIGH (ref 0.61–1.24)
GFR, Estimated: 55 mL/min — ABNORMAL LOW (ref >60)
Glucose, Bld: 143 mg/dL — ABNORMAL HIGH (ref 70–99)
Potassium: 5.1 mmol/L (ref 3.5–5.1)
Sodium: 136 mmol/L (ref 135–145)

## 2020-06-19 LAB — CBC
HCT: 27.2 % — ABNORMAL LOW (ref 39.0–52.0)
HCT: 32.5 % — ABNORMAL LOW (ref 39.0–52.0)
Hemoglobin: 6.5 g/dL — CL (ref 13.0–17.0)
Hemoglobin: 8 g/dL — ABNORMAL LOW (ref 13.0–17.0)
MCH: 18.2 pg — ABNORMAL LOW (ref 26.0–34.0)
MCH: 18.9 pg — ABNORMAL LOW (ref 26.0–34.0)
MCHC: 23.9 g/dL — ABNORMAL LOW (ref 30.0–36.0)
MCHC: 24.6 g/dL — ABNORMAL LOW (ref 30.0–36.0)
MCV: 76.2 fL — ABNORMAL LOW (ref 80.0–100.0)
MCV: 76.8 fL — ABNORMAL LOW (ref 80.0–100.0)
Platelets: 102 10*3/uL — ABNORMAL LOW (ref 150–400)
Platelets: 89 10*3/uL — ABNORMAL LOW (ref 150–400)
RBC: 3.57 MIL/uL — ABNORMAL LOW (ref 4.22–5.81)
RBC: 4.23 MIL/uL (ref 4.22–5.81)
RDW: 23.4 % — ABNORMAL HIGH (ref 11.5–15.5)
RDW: 22.8 % — ABNORMAL HIGH (ref 11.5–15.5)
WBC: 10.9 10*3/uL — ABNORMAL HIGH (ref 4.0–10.5)
WBC: 11.3 10*3/uL — ABNORMAL HIGH (ref 4.0–10.5)
nRBC: 1.8 % — ABNORMAL HIGH (ref 0.0–0.2)
nRBC: 1.8 % — ABNORMAL HIGH (ref 0.0–0.2)

## 2020-06-19 LAB — PREPARE RBC (CROSSMATCH)

## 2020-06-19 LAB — PROCALCITONIN: Procalcitonin: 1.04 ng/mL

## 2020-06-19 LAB — STREP PNEUMONIAE URINARY ANTIGEN: Strep Pneumo Urinary Antigen: NEGATIVE

## 2020-06-19 MED ORDER — SODIUM CHLORIDE 0.9% IV SOLUTION
Freq: Once | INTRAVENOUS | Status: AC
  Administered 2020-06-19: 500 mL via INTRAVENOUS

## 2020-06-19 MED ORDER — BISMUTH SUBSALICYLATE 262 MG/15ML PO SUSP
30.0000 mL | ORAL | Status: DC | PRN
  Filled 2020-06-19: qty 236

## 2020-06-19 NOTE — Progress Notes (Signed)
Patient removed telemetry monitor twice. Educated on the need for continually cardiac monitoring. Patient states that he is having diarrhea. No diarrhea seen by RN or NT. Patient is on a bed alarm and it is activated. MD notified of patient's concern about "diarrhea".

## 2020-06-19 NOTE — Progress Notes (Signed)
eLink Physician-Brief Progress Note Patient Name: CODIE KROGH DOB: [birth year] MRN: [id number]   Date of Service  06/19/2020  HPI/Events of Note  Hg at 6.5  Camera: On nasal  HFNC o2. Appears comfortably resting. VS stable. No tachy. MAP 71.  Discussed with bed side RN. No signs of bleeding. Got PRBC from ED on 1 st.  Did not get PRBC on 2 nd for Hg 6.9. stable thrombocytopenia.   Admitted for CHF/COPD. EF 70%. RVD. PASP 52.   eICU Interventions  - 1 PRBC and consider lasix if gets sob or hypoxemia.  - follow Hg post - on protonix daily - consider GI consultation - stool ocult blood.   Discussed with bed side RN.      Intervention Category Intermediate Interventions: Other:  Ranee Gosselin 06/19/2020, 2:55 AM

## 2020-06-19 NOTE — Progress Notes (Signed)
NAME:  David Short, MRN:  [id number], DOB:  Oct 15, 1970, LOS: 1 ADMISSION DATE:  06/17/2020, CONSULTATION DATE:  06/17/20  REFERRING MD: Girard Cooter , CHIEF COMPLAINT:  Dyspnea on exertion  History of Present Illness:  [name] is a 50 year old man with a history of vertebral osteomyelitis, anterior cervical discectomy,  cervical spinal fusion, Reported HX of HTN per chart, current smoker, chronic back pain, here with dyspnea for the past several weeks.  Mostly he notices dyspnea on exertion.  No dyspnea on talking.  Worsening dyspnea x 2 weeks. He also endorses orthopnea, but he is not sure of the duration (at least months).  Lower extremity edema to the knees for the past two weeks.   In the ED she was treated for COPD exacerbation, as well as volume overload 2/2 CHF.  Found to have a R sided pleural effusion, some atelectasis, pulm edema, ascites, anasarca.  Was given methylprednisolone, duonebs, lasix 40mg  IV, ceftriaxone and azithromycin.   Was placed on bipap due to worsening hypoxemia despite NRB, increasing work of breaking.   Pertinent  Medical History   . ANTERIOR CERVICAL DECOMP/DISCECTOMY FUSION N/A 12/14/2018   Procedure: ANTERIOR CERVICAL DISCECTOMY FUSION CERVICAL FIVE- CERVICAL SIX;  Surgeon: 12/16/2018, MD;  Location: [facility] OR;  Service: Neurosurgery;  Laterality: N/A;  . BACK SURGERY     states never had surgery  . FRACTURE SURGERY Left    elbow/wrist/ankle    Significant Hospital Events: Including procedures, antibiotic start and stop dates in addition to other pertinent events   . 4/2 admitted to ICU requiring bipap . 4/3 doing well off bipap, diuresed well.   Interim History / Subjective:   No overnight issues. Doing well off bipap  Objective   Blood pressure (!) 131/92, pulse 95, temperature 98.7 F (37.1 C), temperature source Oral, resp. rate 15, height 6' (1.829 m), weight 70.7 kg, SpO2 97 %.        Intake/Output Summary (Last 24 hours) at 06/19/2020  1016 Last data filed at 06/19/2020 1015 Gross per 24 hour  Intake 503.75 ml  Output 5100 ml  Net -4596.25 ml   Filed Weights   06/17/20 1938 06/18/20 0230 06/19/20 0433  Weight: 72.6 kg 75.3 kg 70.7 kg    Examination: General: pleasant  HENT: mmm Lungs: diminished bilateral bases, no wheezes or crackles Cardiovascular: tachycardic, regular Abdomen: distended, soft Extremities: bilateral pitting edema, scrotal edema, track marks. Edema improved from prior exam.  Neuro:  Awake alert oriented, no focal deficits.     Labs/imaging that I havepersonally reviewed  (right click and "Reselect all SmartList Selections" daily)   WBC 11.3, Hgb 8.0, Na 136, K 5.1 (downtrending)  Resolved Hospital Problem list   Hypercapnia   Assessment & Plan:   David Short is a 50 y.o. gentlemen with chronic pain and recent IVDU who presents with  Acute (possibly on chronic) RV failure Will diurese with IV lasix 40 mg IV BID Monitor electrolytes, Goal K>4, Mg>2 Monitor I and O No evidence of ACS this admission. Will need outpatient cardiology referral as well as repeat echo at that time.   Acute hypoxemic and hypercapnic respiratory failure Secondary to above, with bilateral pleural effusions Hypercapnia improved, on 5LNC, and will titrate down as tolerated for goal saturations over 90%.   Chronic cervical/back pain:  Intermittently takes oxycodone. 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 Occasional aleve.    Anemia - required 1 unit prbc overnight and another earlier this admission -  no evidence of bleeding, no hemodynamic instability.  - possibly related to chronic disease.   Best practice (right click and "Reselect all SmartList Selections" daily)  Diet:  Oral Pain/Anxiety/Delirium protocol (if indicated): No VAP protocol (if indicated): Not indicated DVT prophylaxis: Subcutaneous Heparin GI prophylaxis: PPI Glucose control:  SSI No Central venous access:  N/A Arterial line:   N/A Foley:  N/A Mobility:  bed rest  PT consulted: Yes Last date of multidisciplinary goals of care discussion []  Code Status:  full code Disposition: ICU - ok for transfer to med surg. No longer needing bipap.  Will sign out to [facility] to assume care 4/4. PCCM to see as needed.   6/4, MD Pulmonary and Critical Care Medicine [facility]   CBC: Recent Labs  Lab 06/17/20 1948 06/17/20 2006 06/17/20 2356 06/18/20 08/18/20 06/19/20 0108 06/19/20 0735  WBC 9.2  --   --  8.2 10.9* 11.3*  NEUTROABS 6.7  --   --  7.2  --   --   HGB 6.4* 9.2* 8.5* 6.9* 6.5* 8.0*  HCT 29.4* 27.0* 25.0* 29.4* 27.2* 32.5*  MCV 76.8*  --   --  76.4* 76.2* 76.8*  PLT 127*  --   --  101* 102* 89*    Basic Metabolic Panel: Recent Labs  Lab 06/17/20 1948 06/17/20 2006 06/17/20 2356 06/18/20 0642 06/18/20 0940 06/19/20 0108  NA 134* 136 135  --  131* 136  K 5.9* 6.1* 5.8*  --  5.6* 5.1  CL 104  --   --   --  100 99  CO2 26  --   --   --  22 34*  GLUCOSE 93  --   --   --  223* 143*  BUN 43*  --   --   --  41* 44*  CREATININE 1.68*  --   --  1.57* 1.61* 1.52*  CALCIUM 8.2*  --   --   --  7.9* 8.1*   GFR: Estimated Creatinine Clearance: 58.1 mL/min (A) (by C-G formula based on SCr of 1.52 mg/dL (H)). Recent Labs  Lab 06/17/20 1948 06/18/20 0642 06/19/20 0108 06/19/20 0735  PROCALCITON  --  1.28 1.04  --   WBC 9.2 8.2 10.9* 11.3*  LATICACIDVEN 1.4  --   --   --     Liver Function Tests: Recent Labs  Lab 06/17/20 1948 06/18/20 0940  AST 34 27  ALT 15 14  ALKPHOS 105 87  BILITOT 1.4* 1.0  PROT 8.2* 7.4  ALBUMIN 2.2* 1.9*   No results for input(s): LIPASE, AMYLASE in the last 168 hours. Recent Labs  Lab 06/17/20 1958  AMMONIA 52*    ABG    Component Value Date/Time   PHART 7.341 (L) 06/17/2020 2356   PCO2ART 52.4 (H) 06/17/2020 2356   PO2ART 184 (H) 06/17/2020 2356   HCO3 28.5 (H) 06/17/2020 2356   TCO2 30 06/17/2020 2356   O2SAT 100.0 06/17/2020 2356      Coagulation Profile: Recent Labs  Lab 06/17/20 2101  INR 1.5*    Cardiac Enzymes: No results for input(s): CKTOTAL, CKMB, CKMBINDEX, TROPONINI in the last 168 hours.  HbA1C: No results found for: HGBA1C  CBG: No results for input(s): GLUCAP in the last 168 hours.

## 2020-06-19 NOTE — Progress Notes (Signed)
Patient continues to complain about diarrhea. RN assisted patient to the bathroom. One small pellet of brown stool noted. No diarrhea noted. Patient educated on what is considered diarrhea. Patient becomes irritated with education.

## 2020-06-19 NOTE — Progress Notes (Signed)
[facility] night shift.  The staff reported that the patient was complaining of diarrhea and requested Imodium.  Please see the nursing staff orders for further details.  He received oxycodone earlier in the evening.  He has as needed Colace and MiraLAX for constipation.  Will defer Imodium since the patient is taking oxycodone.  Pepto-Bismol 30 mL every 4 hours as needed ordered.  Sanda Klein, MD.

## 2020-06-20 ENCOUNTER — Inpatient Hospital Stay (HOSPITAL_COMMUNITY): Payer: Medicaid Other

## 2020-06-20 DIAGNOSIS — R0902 Hypoxemia: Secondary | ICD-10-CM

## 2020-06-20 DIAGNOSIS — R0600 Dyspnea, unspecified: Secondary | ICD-10-CM

## 2020-06-20 LAB — BASIC METABOLIC PANEL
Anion gap: 4 — ABNORMAL LOW (ref 5–15)
BUN: 38 mg/dL — ABNORMAL HIGH (ref 6–20)
CO2: 37 mmol/L — ABNORMAL HIGH (ref 22–32)
Calcium: 8.1 mg/dL — ABNORMAL LOW (ref 8.9–10.3)
Chloride: 94 mmol/L — ABNORMAL LOW (ref 98–111)
Creatinine, Ser: 1.23 mg/dL (ref 0.61–1.24)
GFR, Estimated: >60 mL/min (ref >60)
Glucose, Bld: 70 mg/dL (ref 70–99)
Potassium: 4.4 mmol/L (ref 3.5–5.1)
Sodium: 135 mmol/L (ref 135–145)

## 2020-06-20 LAB — BPAM RBC
Blood Product Expiration Date: 202204292359
Blood Product Expiration Date: 202205012359
ISSUE DATE / TIME: 202204020029
ISSUE DATE / TIME: 202204030326
Unit Type and Rh: 5100
Unit Type and Rh: 5100

## 2020-06-20 LAB — RETICULOCYTES
Immature Retic Fract: 36.6 % — ABNORMAL HIGH (ref 2.3–15.9)
RBC.: 4.05 MIL/uL — ABNORMAL LOW (ref 4.22–5.81)
Retic Count, Absolute: 68.4 10*3/uL (ref 19.0–186.0)
Retic Ct Pct: 1.7 % (ref 0.4–3.1)

## 2020-06-20 LAB — TYPE AND SCREEN
ABO/RH(D): O POS
Antibody Screen: NEGATIVE
Unit division: 0
Unit division: 0

## 2020-06-20 LAB — PROCALCITONIN: Procalcitonin: 0.34 ng/mL

## 2020-06-20 LAB — IRON AND TIBC
Iron: 20 ug/dL — ABNORMAL LOW (ref 45–182)
Saturation Ratios: 5 % — ABNORMAL LOW (ref 17.9–39.5)
TIBC: 440 ug/dL (ref 250–450)
UIBC: 420 ug/dL

## 2020-06-20 LAB — VITAMIN B12: Vitamin B-12: 633 pg/mL (ref 180–914)

## 2020-06-20 LAB — CBC
HCT: 31.8 % — ABNORMAL LOW (ref 39.0–52.0)
Hemoglobin: 7.9 g/dL — ABNORMAL LOW (ref 13.0–17.0)
MCH: 19.5 pg — ABNORMAL LOW (ref 26.0–34.0)
MCHC: 24.8 g/dL — ABNORMAL LOW (ref 30.0–36.0)
MCV: 78.3 fL — ABNORMAL LOW (ref 80.0–100.0)
Platelets: 92 10*3/uL — ABNORMAL LOW (ref 150–400)
RBC: 4.06 MIL/uL — ABNORMAL LOW (ref 4.22–5.81)
RDW: 23 % — ABNORMAL HIGH (ref 11.5–15.5)
WBC: 8.6 10*3/uL (ref 4.0–10.5)
nRBC: 1.2 % — ABNORMAL HIGH (ref 0.0–0.2)

## 2020-06-20 LAB — OCCULT BLOOD X 1 CARD TO LAB, STOOL: Fecal Occult Bld: NEGATIVE

## 2020-06-20 LAB — FOLATE: Folate: 6.4 ng/mL (ref >5.9)

## 2020-06-20 LAB — LEGIONELLA PNEUMOPHILA SEROGP 1 UR AG: L. pneumophila Serogp 1 Ur Ag: NEGATIVE

## 2020-06-20 LAB — FERRITIN: Ferritin: 14 ng/mL — ABNORMAL LOW (ref 24–336)

## 2020-06-20 MED ORDER — NAPROXEN 250 MG PO TABS
500.0000 mg | ORAL_TABLET | Freq: Two times a day (BID) | ORAL | Status: DC | PRN
  Filled 2020-06-20: qty 2

## 2020-06-20 MED ORDER — MORPHINE SULFATE (PF) 2 MG/ML IV SOLN: 2.0000 mg | INTRAVENOUS | Status: DC | PRN

## 2020-06-20 MED ORDER — METHYLPREDNISOLONE SODIUM SUCC 125 MG IJ SOLR
125.0000 mg | Freq: Once | INTRAMUSCULAR | Status: DC
  Administered 2020-06-20: 125 mg via INTRAVENOUS

## 2020-06-20 MED ORDER — MORPHINE SULFATE (PF) 4 MG/ML IV SOLN
4.0000 mg | Freq: Once | INTRAVENOUS | Status: AC
  Administered 2020-06-20: 4 mg via INTRAVENOUS
  Filled 2020-06-20: qty 1

## 2020-06-20 MED ORDER — IPRATROPIUM-ALBUTEROL 0.5-2.5 (3) MG/3ML IN SOLN: 3.0000 mL | Freq: Four times a day (QID) | RESPIRATORY_TRACT | Status: DC

## 2020-06-20 MED ORDER — DOXYCYCLINE HYCLATE 100 MG PO TABS
100.0000 mg | ORAL_TABLET | Freq: Two times a day (BID) | ORAL | Status: DC
  Administered 2020-06-20 – 2020-06-26 (×13): 100 mg via ORAL
  Filled 2020-06-20 (×13): qty 1

## 2020-06-20 MED ORDER — ONDANSETRON 4 MG PO TBDP
4.0000 mg | ORAL_TABLET | Freq: Four times a day (QID) | ORAL | Status: AC | PRN
  Administered 2020-06-20: 4 mg via ORAL
  Filled 2020-06-20 (×2): qty 1

## 2020-06-20 MED ORDER — METHYLPREDNISOLONE SODIUM SUCC 125 MG IJ SOLR
60.0000 mg | Freq: Three times a day (TID) | INTRAMUSCULAR | Status: DC
  Administered 2020-06-20 – 2020-06-23 (×9): 60 mg via INTRAVENOUS
  Filled 2020-06-20 (×9): qty 2

## 2020-06-20 MED ORDER — IPRATROPIUM-ALBUTEROL 0.5-2.5 (3) MG/3ML IN SOLN
3.0000 mL | Freq: Four times a day (QID) | RESPIRATORY_TRACT | Status: DC | PRN
  Administered 2020-06-24 – 2020-06-26 (×2): 3 mL via RESPIRATORY_TRACT
  Filled 2020-06-20 (×2): qty 3

## 2020-06-20 MED ORDER — CLONIDINE HCL 0.1 MG PO TABS
0.1000 mg | ORAL_TABLET | Freq: Every day | ORAL | Status: AC
  Administered 2020-06-24 – 2020-06-25 (×2): 0.1 mg via ORAL
  Filled 2020-06-20 (×2): qty 1

## 2020-06-20 MED ORDER — HYDROXYZINE HCL 25 MG PO TABS
25.0000 mg | ORAL_TABLET | Freq: Four times a day (QID) | ORAL | Status: AC | PRN
Start: 2020-06-20 — End: 2020-06-25
  Administered 2020-06-20 – 2020-06-24 (×8): 25 mg via ORAL
  Filled 2020-06-20 (×8): qty 1

## 2020-06-20 MED ORDER — METHOCARBAMOL 500 MG PO TABS
500.0000 mg | ORAL_TABLET | Freq: Three times a day (TID) | ORAL | Status: AC | PRN
  Administered 2020-06-23 – 2020-06-24 (×2): 500 mg via ORAL
  Filled 2020-06-20 (×3): qty 1

## 2020-06-20 MED ORDER — ARFORMOTEROL TARTRATE 15 MCG/2ML IN NEBU: 15.0000 ug | INHALATION_SOLUTION | Freq: Two times a day (BID) | RESPIRATORY_TRACT | Status: DC

## 2020-06-20 MED ORDER — LIDOCAINE 5 % EX PTCH
1.0000 | MEDICATED_PATCH | CUTANEOUS | Status: DC
  Administered 2020-06-20 – 2020-06-26 (×7): 1 via TRANSDERMAL
  Filled 2020-06-20 (×7): qty 1

## 2020-06-20 MED ORDER — CLONIDINE HCL 0.1 MG PO TABS
0.1000 mg | ORAL_TABLET | ORAL | Status: AC
  Administered 2020-06-22 – 2020-06-23 (×4): 0.1 mg via ORAL
  Filled 2020-06-20 (×3): qty 1

## 2020-06-20 MED ORDER — ARFORMOTEROL TARTRATE 15 MCG/2ML IN NEBU
15.0000 ug | INHALATION_SOLUTION | Freq: Two times a day (BID) | RESPIRATORY_TRACT | Status: DC
  Administered 2020-06-20 – 2020-06-26 (×11): 15 ug via RESPIRATORY_TRACT
  Filled 2020-06-20 (×12): qty 2

## 2020-06-20 MED ORDER — LOPERAMIDE HCL 2 MG PO CAPS: 2.0000 mg | ORAL_CAPSULE | ORAL | Status: AC | PRN

## 2020-06-20 MED ORDER — DICYCLOMINE HCL 20 MG PO TABS
20.0000 mg | ORAL_TABLET | Freq: Four times a day (QID) | ORAL | Status: DC | PRN
  Filled 2020-06-20: qty 1

## 2020-06-20 MED ORDER — CLONIDINE HCL 0.1 MG PO TABS
0.1000 mg | ORAL_TABLET | Freq: Four times a day (QID) | ORAL | Status: AC
  Administered 2020-06-20 – 2020-06-21 (×8): 0.1 mg via ORAL
  Filled 2020-06-20 (×8): qty 1

## 2020-06-20 NOTE — Progress Notes (Signed)
RN in room to reapply telemetry leads. Patient with one incontinence episode and urine in the floor. of urine in the urinal. Pericare and linens changed.

## 2020-06-20 NOTE — Progress Notes (Signed)
[facility]                                                                              Patient Demographics  David Short, is a 50 y.o. male, DOB - May 09, 1970, HBZ:[id number]  Admit date - 06/17/2020   Admitting Physician Charlotte Sanes, MD  Outpatient Primary MD for the patient is Default, Provider, MD  Outpatient specialists:   LOS - 2  days   Medical records reviewed and are as summarized below:    Chief Complaint  Patient presents with  . Shortness of Breath       Brief summary   Patient is a 50 year old man with a history of vertebral osteomyelitis, anterior cervical discectomy,  cervical spinal fusion, Reported HX of HTN per chart, current smoker, chronic back pain, here with dyspnea for the past several weeks.  Mostly he notices dyspnea on exertion.  No dyspnea on talking.  Worsening dyspnea x 2 weeks. He also endorses orthopnea, but he is not sure of the duration (at least months).  Lower extremity edema to the knees for the past two weeks.  In the ED, patient was treated for COPD exacerbation, as well as volume overload due to CHF.Found to have a R sided pleural effusion, some atelectasis, pulm edema, ascites, anasarca.  Was given methylprednisolone, duonebs, lasix 40mg  IV, ceftriaxone and azithromycin.  Was placed on bipap due to worsening hypoxemia despite NRB, increasing work of breaking.  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, last use on Friday, April 1  Assessment & Plan    Acute, possibly on chronic CHF RV failure, acute hypoxic and hypercapnic respiratory failure -Patient was admitted to ICU due to worsening hypoxemia, despite NRB, was placed on BiPAP -Still having shortness of breath with diffuse wheezing, also has COPD exacerbation -Currently on O2 6 L via [place], not on any O2 at home -2D echo showed severely enlarged right ventricular size,  moderately elevated pulmonary artery systolic pressure, 52.5 mmHg, small pericardial effusion, EF 65 to 70% -Continue IV Lasix 40 mg every 12 hours  Acute COPD exacerbation -Diffuse wheezing at the time of my examination.  Chest x-ray showed improved but not resolved right pleural effusion, bilateral perihilar opacity most resembling vascular congestion -Placed on scheduled duo nebs, IV Solu-Medrol, Brovana, doxycycline   Chronic cervical/back pain, -Intermittently takes oxycodone, states currently pain 12/10, has high pain tolerance level due to heroin use -Added IV morphine as needed for breakthrough pain  Likely IV heroin drug withdrawals -Patient with dilated pupils, states had diarrhea overnight, now increased pain -Placed on clonidine withdrawal protocol  Anemia of chronic disease, microcytic -Obtain anemia panel, FOBT -hemoglobin 6.5 on 4/3, was transfused 1 unit packed RBCs -Baseline hemoglobin 12-13  Code Status: Full CODE STATUS DVT Prophylaxis:  SCDs Start: 06/18/20 0051   Level of Care: Level of care: Med-Surg Family Communication: Discussed all imaging results, lab results, explained to the patient, mother and brother at the bedside   Disposition Plan:     Status is: Inpatient  Remains  inpatient appropriate because:Inpatient level of care appropriate due to severity of illness   Dispo: The patient is from: Home              Anticipated d/c is to: Home              Patient currently is not medically stable to d/c.  Currently on IV Lasix for diuresis, diffuse wheezing, IV heroin withdrawals   Difficult to place patient No      Time Spent in minutes 35 minutes  Procedures:  None  Consultants:   Admitted by PCCM  Antimicrobials:   Anti-infectives (From admission, onward)   Start     Dose/Rate Route Frequency Ordered Stop   06/20/20 1000  doxycycline (VIBRA-TABS) tablet 100 mg        100 mg Oral Every 12 hours 06/20/20 0841     06/17/20 2345   cefTRIAXone (ROCEPHIN) 1 g in sodium chloride 0.9 % 100 mL IVPB        1 g 200 mL/hr over 30 Minutes Intravenous  Once 06/17/20 2341 06/18/20 0119   06/17/20 2345  azithromycin (ZITHROMAX) 500 mg in sodium chloride 0.9 % 250 mL IVPB        500 mg 250 mL/hr over 60 Minutes Intravenous  Once 06/17/20 2341 06/18/20 0614          Medications  Scheduled Meds: . arformoterol  15 mcg Nebulization BID  . Chlorhexidine Gluconate Cloth  6 each Topical Daily  . cloNIDine  0.1 mg Oral QID   Followed by  . [START ON 06/22/2020] cloNIDine  0.1 mg Oral BH-qamhs   Followed by  . [START ON 06/24/2020] cloNIDine  0.1 mg Oral QAC breakfast  . doxycycline  100 mg Oral Q12H  . furosemide  40 mg Intravenous BID  . lidocaine  1 patch Transdermal Q24H  . methylPREDNISolone (SOLU-MEDROL) injection  60 mg Intravenous Q8H  . pantoprazole  40 mg Oral Daily   Continuous Infusions: PRN Meds:.bismuth subsalicylate, dicyclomine, hydrOXYzine, ipratropium-albuterol, loperamide, methocarbamol, morphine injection, naproxen, ondansetron (ZOFRAN) IV, ondansetron, oxyCODONE, polyethylene glycol      Subjective:   David Short was seen and examined today.  States pain uncontrolled and his lower back pain, chronic issue.  Also short of breath not wearing his O2, diffuse wheezing, productive phlegm.  States last heroin use on Friday, 4/1 and had diarrhea overnight. Patient denies dizziness, chest pain,  abdominal pain, nausea vomiting new weakness, numbess, tingling.  No fevers  Objective:   Vitals:   06/20/20 0811 06/20/20 1002 06/20/20 1104 06/20/20 1213  BP: 123/83 123/88 126/85   Pulse:   96   Resp: (!) 22  20   Temp: 98.5 F (36.9 C)  98.5 F (36.9 C)   TempSrc: Oral  Oral   SpO2: 96%  91% 94%  Weight:      Height:        Intake/Output Summary (Last 24 hours) at 06/20/2020 1351 Last data filed at 06/20/2020 1002 Gross per 24 hour  Intake 820 ml  Output 4625 ml  Net -3805 ml     Wt Readings  from Last 3 Encounters:  06/20/20 68.1 kg  12/30/18 71.2 kg  08/18/16 77.1 kg     Exam  General: Alert and oriented x 3, NAD  Cardiovascular: S1 S2 auscultated, no murmurs, RRR  Respiratory: Diffuse wheezing bilaterally  Gastrointestinal: Soft, nontender, nondistended, + bowel sounds  Ext: b/l 1+ pedal edema bilaterally  Neuro: no new deficits  Musculoskeletal: No  digital cyanosis, clubbing  Skin: No rashes  Psych: somewhat anxious and frustrated   Data Reviewed:  I have personally reviewed following labs and imaging studies  Micro Results Recent Results (from the past 240 hour(s))  Resp Panel by RT-PCR (Flu A&B, Covid) Nasopharyngeal Swab     Status: None   Collection Time: 06/17/20  9:49 PM   Specimen: Nasopharyngeal Swab; Nasopharyngeal(NP) swabs in vial transport medium  Result Value Ref Range Status   SARS Coronavirus 2 by RT PCR NEGATIVE NEGATIVE Final    Comment: (NOTE) SARS-CoV-2 target nucleic acids are NOT DETECTED.  The SARS-CoV-2 RNA is generally detectable in upper respiratory specimens during the acute phase of infection. The lowest concentration of SARS-CoV-2 viral copies this assay can detect is 138 copies/mL. A negative result does not preclude SARS-Cov-2 infection and should not be used as the sole basis for treatment or other patient management decisions. A negative result may occur with  improper specimen collection/handling, submission of specimen other than nasopharyngeal swab, presence of viral mutation(s) within the areas targeted by this assay, and inadequate number of viral copies(<138 copies/mL). A negative result must be combined with clinical observations, patient history, and epidemiological information. The expected result is Negative.  Fact Sheet for Patients:  BloggerCourse.com  Fact Sheet for Healthcare Providers:  SeriousBroker.it  This test is no t yet approved or cleared by  the [place] FDA and  has been authorized for detection and/or diagnosis of SARS-CoV-2 by FDA under an Emergency Use Authorization (EUA). This EUA will remain  in effect (meaning this test can be used) for the duration of the COVID-19 declaration under Section 564(b)(1) of the Act, 21 U.S.C.section 360bbb-3(b)(1), unless the authorization is terminated  or revoked sooner.       Influenza A by PCR NEGATIVE NEGATIVE Final   Influenza B by PCR NEGATIVE NEGATIVE Final    Comment: (NOTE) The Xpert Xpress SARS-CoV-2/FLU/RSV plus assay is intended as an aid in the diagnosis of influenza from Nasopharyngeal swab specimens and should not be used as a sole basis for treatment. Nasal washings and aspirates are unacceptable for Xpert Xpress SARS-CoV-2/FLU/RSV testing.  Fact Sheet for Patients: BloggerCourse.com  Fact Sheet for Healthcare Providers: SeriousBroker.it  This test is not yet approved or cleared by the [place] FDA and has been authorized for detection and/or diagnosis of SARS-CoV-2 by FDA under an Emergency Use Authorization (EUA). This EUA will remain in effect (meaning this test can be used) for the duration of the COVID-19 declaration under Section 564(b)(1) of the Act, 21 U.S.C. section 360bbb-3(b)(1), unless the authorization is terminated or revoked.  Performed at [facility], [address], [place], [place] [address]     Radiology Reports CT Head Wo Contrast  Result Date: 06/17/2020 CLINICAL DATA:  Abnormal mental status, head trauma EXAM: CT HEAD WITHOUT CONTRAST TECHNIQUE: Contiguous axial images were obtained from the base of the skull through the vertex without intravenous contrast. COMPARISON:  MRI 12/13/2018 FINDINGS: Brain: No acute intracranial abnormality. Specifically, no hemorrhage, hydrocephalus, mass lesion, acute infarction, or significant intracranial injury. Vascular: No hyperdense vessel  or unexpected calcification. Skull: No acute calvarial abnormality. Sinuses/Orbits: No acute findings. Mucosal thickening in the paranasal sinuses. Mastoid air cells clear. Other: None IMPRESSION: No acute intracranial abnormality. Electronically Signed   By: Charlett Nose M.D.   On: 06/17/2020 22:48   CT Chest W Contrast  Result Date: 06/17/2020 CLINICAL DATA:  Shortness of breath for 2 weeks with altered mental status,  swelling and edema in the upper and lower extremities, 50% on room air EXAM: CT CHEST, ABDOMEN, AND PELVIS WITH CONTRAST TECHNIQUE: Multidetector CT imaging of the chest, abdomen and pelvis was performed following the standard protocol during bolus administration of intravenous contrast. CONTRAST:  OMNIPAQUE IOHEXOL 300 MG/ML  SOLN COMPARISON:  Radiograph 06/17/2020 renal ultrasound 03/17/2012, CT 03/01/2012 FINDINGS: CT CHEST FINDINGS Cardiovascular: Borderline cardiomegaly with trace pericardial effusion. Extensive three-vessel coronary artery atherosclerosis. The aortic root is suboptimally assessed given cardiac pulsation artifact. Atherosclerotic plaque within the normal caliber aorta. No acute luminal abnormality of the imaged aorta. No periaortic stranding or hemorrhage. Normal 3 vessel branching of the aortic arch. Proximal great vessels are mildly calcified but otherwise unremarkable. Central pulmonary arteries are enlarged. No large central filling defects are evident within the limitations of this non tailored examination of the pulmonary arteries. Mediastinum/Nodes: Low-attenuation, borderline enlarged mediastinal and hilar lymph nodes are present including a 12 mm paratracheal lymph node (4/23), and 11 mm AP window lymph node (4/21) and a 12 mm left hilar lymph node (6/111). No acute abnormality of the trachea or esophagus. Thyroid gland and thoracic inlet are unremarkable. Lungs/Pleura: Large right and small left pleural effusion with areas of fairly uniformly enhancing  atelectatic passive and dependent atelectasis though scattered fluid-filled and thickened airways are noted particularly towards the lung bases and some underlying degree of aspiration is not excluded. No pneumothorax. No other focal airspace opacity. No concerning pulmonary nodules or masses. Musculoskeletal: No acute osseous abnormality or suspicious osseous lesion. CT ABDOMEN PELVIS FINDINGS Hepatobiliary: No concerning focal liver lesion. Diffuse periportal edema. Smooth liver surface contour. No significant gallbladder wall thickening. Pericholecystic fluid is likely related to redistributed ascites throughout the abdomen. Pancreas: No pancreatic ductal dilatation or surrounding inflammatory changes. Spleen: Slightly heterogeneous enhancement of the spleen may be related to contrast timing. Splenomegaly is present. Measuring up to 18 cm in craniocaudal dimension. Normal splenic cleft. No concerning splenic lesion. Adrenals/Urinary Tract: Normal adrenal glands. Symmetrically delayed renal enhancement and excretion. No visible focal renal lesion. No urolithiasis or hydronephrosis. Urinary bladder is unremarkable for degree of distension. Stomach/Bowel: Stomach and duodenum unremarkable. Slightly edematous appearance of the distal small bowel may be related to presence of moderate volume ascites and presumed volume third-spacing. No colonic dilatation or wall thickening. No evidence of bowel obstruction. The appendix is not well visualized. Vascular/Lymphatic: Atherosclerotic calcifications within the abdominal aorta and branch vessels. No aneurysm or ectasia. No enlarged abdominopelvic lymph nodes. Edematous appearing mesenteric nodes as well as some borderline enlarged though low-attenuation bilateral inguinal nodes. No other conspicuous enlarged abdominopelvic nodes. Reproductive: Coarse eccentric calcification of the prostate. No concerning abnormalities of the prostate or seminal vesicles. A markedly  edematous changes of the soft tissues of the included internal genitalia. Other: Moderate volume ascites with additional edematous changes throughout the mesentery. Extensive circumferential body wall edema noted as well. Tiny fat containing umbilical hernia. No bowel containing hernias. Musculoskeletal: Bony fusion across the L1-2 L3 levels may reflect sequela of prior infection or injury. Given that this is a new finding since 2013. No other acute or conspicuous osseous abnormalities. Bones of the pelvis appear intact and congruent. IMPRESSION: 1. Extensive features of anasarca with circumferential body wall edema, pleural and pericardial effusions, periportal edema, moderate volume ascites and edematous changes of the mesentery. 2. Uniformly enhancing atelectatic passive and dependent atelectasis adjacent the basilar effusions though scattered fluid-filled and thickened airways are noted particularly towards the lung bases and some underlying degree  of aspiration is not excluded. 3. Low-attenuation, borderline enlarged mediastinal hilar, and abdominopelvic nodes are nonspecific but could be reactive or edematous given features elsewhere though follow-up imaging may be warranted given the presence of splenomegaly as well. 4. Delayed renal enhancement bilaterally, nonspecific though should correlate for clinical of diminished renal function. 5. Bony fusion across the L1-2 L3 levels may reflect sequela of prior infection or injury. New finding since 2013. 6. Aortic Atherosclerosis (ICD10-I70.0). 7. Coronary artery calcifications are present. Please note that the presence of coronary artery calcium documents the presence of coronary artery disease, the severity of this disease and any potential stenosis cannot be assessed on this non-gated CT examination. Electronically Signed   By: Kreg Shropshire M.D.   On: 06/17/2020 23:07   CT ABDOMEN PELVIS W CONTRAST  Result Date: 06/17/2020 CLINICAL DATA:  Shortness of breath  for 2 weeks with altered mental status, swelling and edema in the upper and lower extremities, 50% on room air EXAM: CT CHEST, ABDOMEN, AND PELVIS WITH CONTRAST TECHNIQUE: Multidetector CT imaging of the chest, abdomen and pelvis was performed following the standard protocol during bolus administration of intravenous contrast. CONTRAST:  OMNIPAQUE IOHEXOL 300 MG/ML  SOLN COMPARISON:  Radiograph 06/17/2020 renal ultrasound 03/17/2012, CT 03/01/2012 FINDINGS: CT CHEST FINDINGS Cardiovascular: Borderline cardiomegaly with trace pericardial effusion. Extensive three-vessel coronary artery atherosclerosis. The aortic root is suboptimally assessed given cardiac pulsation artifact. Atherosclerotic plaque within the normal caliber aorta. No acute luminal abnormality of the imaged aorta. No periaortic stranding or hemorrhage. Normal 3 vessel branching of the aortic arch. Proximal great vessels are mildly calcified but otherwise unremarkable. Central pulmonary arteries are enlarged. No large central filling defects are evident within the limitations of this non tailored examination of the pulmonary arteries. Mediastinum/Nodes: Low-attenuation, borderline enlarged mediastinal and hilar lymph nodes are present including a 12 mm paratracheal lymph node (4/23), and 11 mm AP window lymph node (4/21) and a 12 mm left hilar lymph node (6/111). No acute abnormality of the trachea or esophagus. Thyroid gland and thoracic inlet are unremarkable. Lungs/Pleura: Large right and small left pleural effusion with areas of fairly uniformly enhancing atelectatic passive and dependent atelectasis though scattered fluid-filled and thickened airways are noted particularly towards the lung bases and some underlying degree of aspiration is not excluded. No pneumothorax. No other focal airspace opacity. No concerning pulmonary nodules or masses. Musculoskeletal: No acute osseous abnormality or suspicious osseous lesion. CT ABDOMEN PELVIS  FINDINGS Hepatobiliary: No concerning focal liver lesion. Diffuse periportal edema. Smooth liver surface contour. No significant gallbladder wall thickening. Pericholecystic fluid is likely related to redistributed ascites throughout the abdomen. Pancreas: No pancreatic ductal dilatation or surrounding inflammatory changes. Spleen: Slightly heterogeneous enhancement of the spleen may be related to contrast timing. Splenomegaly is present. Measuring up to 18 cm in craniocaudal dimension. Normal splenic cleft. No concerning splenic lesion. Adrenals/Urinary Tract: Normal adrenal glands. Symmetrically delayed renal enhancement and excretion. No visible focal renal lesion. No urolithiasis or hydronephrosis. Urinary bladder is unremarkable for degree of distension. Stomach/Bowel: Stomach and duodenum unremarkable. Slightly edematous appearance of the distal small bowel may be related to presence of moderate volume ascites and presumed volume third-spacing. No colonic dilatation or wall thickening. No evidence of bowel obstruction. The appendix is not well visualized. Vascular/Lymphatic: Atherosclerotic calcifications within the abdominal aorta and branch vessels. No aneurysm or ectasia. No enlarged abdominopelvic lymph nodes. Edematous appearing mesenteric nodes as well as some borderline enlarged though low-attenuation bilateral inguinal nodes. No other conspicuous enlarged  abdominopelvic nodes. Reproductive: Coarse eccentric calcification of the prostate. No concerning abnormalities of the prostate or seminal vesicles. A markedly edematous changes of the soft tissues of the included internal genitalia. Other: Moderate volume ascites with additional edematous changes throughout the mesentery. Extensive circumferential body wall edema noted as well. Tiny fat containing umbilical hernia. No bowel containing hernias. Musculoskeletal: Bony fusion across the L1-2 L3 levels may reflect sequela of prior infection or injury.  Given that this is a new finding since 2013. No other acute or conspicuous osseous abnormalities. Bones of the pelvis appear intact and congruent. IMPRESSION: 1. Extensive features of anasarca with circumferential body wall edema, pleural and pericardial effusions, periportal edema, moderate volume ascites and edematous changes of the mesentery. 2. Uniformly enhancing atelectatic passive and dependent atelectasis adjacent the basilar effusions though scattered fluid-filled and thickened airways are noted particularly towards the lung bases and some underlying degree of aspiration is not excluded. 3. Low-attenuation, borderline enlarged mediastinal hilar, and abdominopelvic nodes are nonspecific but could be reactive or edematous given features elsewhere though follow-up imaging may be warranted given the presence of splenomegaly as well. 4. Delayed renal enhancement bilaterally, nonspecific though should correlate for clinical of diminished renal function. 5. Bony fusion across the L1-2 L3 levels may reflect sequela of prior infection or injury. New finding since 2013. 6. Aortic Atherosclerosis (ICD10-I70.0). 7. Coronary artery calcifications are present. Please note that the presence of coronary artery calcium documents the presence of coronary artery disease, the severity of this disease and any potential stenosis cannot be assessed on this non-gated CT examination. Electronically Signed   By: Kreg ShropshirePrice  DeHay M.D.   On: 06/17/2020 23:07   DG Chest Port 1 View  Result Date: 06/20/2020 CLINICAL DATA:  50 year old male with chest pain and shortness of breath. Smoker. EXAM: PORTABLE CHEST 1 VIEW COMPARISON:  CT Chest, Abdomen, and Pelvis 06/17/2020 and earlier. FINDINGS: Portable AP semi upright view at 0901 hours. Significantly regressed right pleural effusion. Small residual suspected. Decreased but not resolved bilateral perihilar interstitial opacity slightly greater on the right. Normal cardiac size and  mediastinal contours. Visualized tracheal air column is within normal limits. No pneumothorax. No areas of worsening ventilation. Paucity of bowel gas in the upper abdomen. No acute osseous abnormality identified. Prior cervical ACDF. IMPRESSION: 1. Regressed but not resolved right pleural effusion and bilateral perihilar opacity, most resembling vascular congestion. 2. No new cardiopulmonary abnormality. Electronically Signed   By: Odessa FlemingH  Hall M.D.   On: 06/20/2020 09:16   DG Chest Portable 1 View  Result Date: 06/17/2020 CLINICAL DATA:  Shortness of breath EXAM: PORTABLE CHEST 1 VIEW COMPARISON:  12/12/2018 FINDINGS: Shallow inspiration. Small to moderate right pleural effusion with consolidation in the right lung base. Linear atelectasis or infiltration in the left base. Cardiac enlargement. No vascular congestion. Calcification of the aorta. IMPRESSION: Small to moderate right pleural effusion with consolidation in the right lung base. Linear atelectasis or infiltration in the left base. Electronically Signed   By: Burman NievesWilliam  Stevens M.D.   On: 06/17/2020 20:18   ECHOCARDIOGRAM COMPLETE  Result Date: 06/18/2020    ECHOCARDIOGRAM REPORT   Patient Name:   Liliana ClineIMOTHY A Guillet Date of Exam: 06/18/2020 Medical Rec #:  [id number]         Height:       72.0 in Accession #:    [id number]        Weight:       166.0 lb Date of Birth:  [birth year]  BSA:          1.968 m Patient Age:    49 years          BP:           115/71 mmHg Patient Gender: M                 HR:           101 bpm. Exam Location:  Inpatient Procedure: 2D Echo, Color Doppler and Cardiac Doppler Indications:    I51.7 Cardiomegaly; Acute Respiratory Distress R06.03  History:        Patient has no prior history of Echocardiogram examinations.  Sonographer:    Tiffany Dance Referring Phys: [id number] Charlotte Sanes IMPRESSIONS  1. Right ventricular systolic function is normal. The right ventricular size is severely enlarged. There is moderately elevated  pulmonary artery systolic pressure. The estimated right ventricular systolic pressure is 52.5 mmHg. Right ventricular apex not well visualized. Coronary sinus dilation consistent with right ventricular overload.  2. Right atrial size was severely dilated.  3. A small pericardial effusion is present. The pericardial effusion is circumferential. There is no evidence of increased pericardial pressure.  4. Left ventricular ejection fraction, by estimation, is 65 to 70%. The left ventricle has normal function. The left ventricle has no regional wall motion abnormalities. There is mild concentric left ventricular hypertrophy. Left ventricular diastolic parameters were normal. There is the interventricular septum is flattened in systole, consistent with right ventricular pressure overload.  5. Tricuspid valve regurgitation is moderate and eccentric best seen in A4c view .  6. Left atrial size was mildly dilated.  7. The mitral valve is normal in structure. Trivial mitral valve regurgitation.  8. The aortic valve is tricuspid. Aortic valve regurgitation is not visualized. No aortic stenosis is present.  9. The inferior vena cava is dilated in size with <50% respiratory variability, suggesting right atrial pressure of 15 mmHg. Comparison(s): No prior Echocardiogram. FINDINGS  Left Ventricle: Left ventricular ejection fraction, by estimation, is 65 to 70%. The left ventricle has normal function. The left ventricle has no regional wall motion abnormalities. The left ventricular internal cavity size was normal in size. There is  mild concentric left ventricular hypertrophy. The interventricular septum is flattened in systole, consistent with right ventricular pressure overload. Left ventricular diastolic parameters were normal. Right Ventricle: The right ventricular size is severely enlarged. No increase in right ventricular wall thickness. Right ventricular systolic function is normal. There is moderately elevated pulmonary  artery systolic pressure. The tricuspid regurgitant velocity is 3.06 m/s, and with an assumed right atrial pressure of 15 mmHg, the estimated right ventricular systolic pressure is 52.5 mmHg. Left Atrium: Left atrial size was mildly dilated. Right Atrium: Right atrial size was severely dilated. Pericardium: A small pericardial effusion is present. The pericardial effusion is circumferential. There is no evidence of cardiac tamponade. Mitral Valve: The mitral valve is normal in structure. Trivial mitral valve regurgitation. Tricuspid Valve: The tricuspid valve is normal in structure. Tricuspid valve regurgitation is moderate . No evidence of tricuspid stenosis. Aortic Valve: The aortic valve is tricuspid. Aortic valve regurgitation is not visualized. No aortic stenosis is present. Pulmonic Valve: Discrepancy between Spectral Doppler and Color Doppler. The pulmonic valve was grossly normal. Pulmonic valve regurgitation is not visualized. No evidence of pulmonic stenosis. Aorta: The aortic root and ascending aorta are structurally normal, with no evidence of dilitation. Venous: The inferior vena cava is dilated in size with less than 50% respiratory variability,  suggesting right atrial pressure of 15 mmHg. IAS/Shunts: The atrial septum is grossly normal.  LEFT VENTRICLE PLAX 2D LVIDd:         4.80 cm  Diastology LVIDs:         2.30 cm  LV e' medial:    11.00 cm/s LV PW:         1.30 cm  LV E/e' medial:  8.5 LV IVS:        1.10 cm  LV e' lateral:   10.30 cm/s LVOT diam:     2.00 cm  LV E/e' lateral: 9.0 LV SV:         75 LV SV Index:   38 LVOT Area:     3.14 cm  RIGHT VENTRICLE             IVC RV Basal diam:  3.80 cm     IVC diam: 3.30 cm RV Mid diam:    2.80 cm RV S prime:     16.50 cm/s TAPSE (M-mode): 1.9 cm LEFT ATRIUM              Index       RIGHT ATRIUM           Index LA diam:        5.60 cm  2.84 cm/m  RA Area:     28.50 cm LA Vol (A2C):   122.0 ml 61.98 ml/m RA Volume:   111.00 ml 56.39 ml/m LA Vol (A4C):    46.7 ml  23.73 ml/m LA Biplane Vol: 75.8 ml  38.51 ml/m  AORTIC VALVE LVOT Vmax:   123.00 cm/s LVOT Vmean:  82.400 cm/s LVOT VTI:    0.239 m  AORTA Ao Root diam: 3.60 cm Ao Asc diam:  3.30 cm MITRAL VALVE               TRICUSPID VALVE MV Area (PHT): 3.48 cm    TR Peak grad:   37.5 mmHg MV Decel Time: 218 msec    TR Vmax:        306.00 cm/s MV E velocity: 93.00 cm/s MV A velocity: 88.10 cm/s  SHUNTS MV E/A ratio:  1.06        Systemic VTI:  0.24 m                            Systemic Diam: 2.00 cm Riley Lam MD Electronically signed by Riley Lam MD Signature Date/Time: 06/18/2020/4:36:14 PM    Final     Lab Data:  CBC: Recent Labs  Lab 06/17/20 1948 06/17/20 2006 06/17/20 2356 06/18/20 1610 06/19/20 0108 06/19/20 0735 06/20/20 0645  WBC 9.2  --   --  8.2 10.9* 11.3* 8.6  NEUTROABS 6.7  --   --  7.2  --   --   --   HGB 6.4*   < > 8.5* 6.9* 6.5* 8.0* 7.9*  HCT 29.4*   < > 25.0* 29.4* 27.2* 32.5* 31.8*  MCV 76.8*  --   --  76.4* 76.2* 76.8* 78.3*  PLT 127*  --   --  101* 102* 89* 92*   < > = values in this interval not displayed.   Basic Metabolic Panel: Recent Labs  Lab 06/17/20 1948 06/17/20 2006 06/17/20 2356 06/18/20 0642 06/18/20 0940 06/19/20 0108 06/20/20 0645  NA 134* 136 135  --  131* 136 135  K 5.9* 6.1* 5.8*  --  5.6* 5.1 4.4  CL 104  --   --   --  100 99 94*  CO2 26  --   --   --  22 34* 37*  GLUCOSE 93  --   --   --  223* 143* 70  BUN 43*  --   --   --  41* 44* 38*  CREATININE 1.68*  --   --  1.57* 1.61* 1.52* 1.23  CALCIUM 8.2*  --   --   --  7.9* 8.1* 8.1*   GFR: Estimated Creatinine Clearance: 69.2 mL/min (by C-G formula based on SCr of 1.23 mg/dL). Liver Function Tests: Recent Labs  Lab 06/17/20 1948 06/18/20 0940  AST 34 27  ALT 15 14  ALKPHOS 105 87  BILITOT 1.4* 1.0  PROT 8.2* 7.4  ALBUMIN 2.2* 1.9*   No results for input(s): LIPASE, AMYLASE in the last 168 hours. Recent Labs  Lab 06/17/20 1958  AMMONIA 52*    Coagulation Profile: Recent Labs  Lab 06/17/20 2101  INR 1.5*   Cardiac Enzymes: No results for input(s): CKTOTAL, CKMB, CKMBINDEX, TROPONINI in the last 168 hours. BNP (last 3 results) No results for input(s): PROBNP in the last 8760 hours. HbA1C: No results for input(s): HGBA1C in the last 72 hours. CBG: No results for input(s): GLUCAP in the last 168 hours. Lipid Profile: No results for input(s): CHOL, HDL, LDLCALC, TRIG, CHOLHDL, LDLDIRECT in the last 72 hours. Thyroid Function Tests: Recent Labs    06/17/20 2350 06/18/20 0642  TSH 17.287*  --   FREET4  --  0.77   Anemia Panel: No results for input(s): VITAMINB12, FOLATE, FERRITIN, TIBC, IRON, RETICCTPCT in the last 72 hours. Urine analysis:    Component Value Date/Time   COLORURINE YELLOW 06/17/2020 2340   APPEARANCEUR HAZY (A) 06/17/2020 2340   LABSPEC 1.010 06/17/2020 2340   PHURINE 5.0 06/17/2020 2340   GLUCOSEU NEGATIVE 06/17/2020 2340   HGBUR LARGE (A) 06/17/2020 2340   BILIRUBINUR NEGATIVE 06/17/2020 2340   KETONESUR NEGATIVE 06/17/2020 2340   PROTEINUR 30 (A) 06/17/2020 2340   UROBILINOGEN 1.0 03/17/2012 0047   NITRITE NEGATIVE 06/17/2020 2340   LEUKOCYTESUR NEGATIVE 06/17/2020 2340     Breianna Delfino M.D. [facility] 06/20/2020, 1:51 PM  Available via Epic secure chat 7am-7pm After 7 pm, please refer to night coverage provider listed on amion.

## 2020-06-20 NOTE — Discharge Instructions (Addendum)
Heart Failure, Diagnosis  Heart failure means that your heart is not able to pump blood in the right way. This makes it hard for your body to work well. Heart failure is usually a long-term (chronic) condition. You must take good care of yourself and follow your treatment plan from your doctor. What are the causes?  High blood pressure.  Buildup of cholesterol and fat in the arteries.  Heart attack. This injures the heart muscle.  Heart valves that do not open and close properly.  Damage of the heart muscle. This is also called cardiomyopathy.  Infection of the heart muscle. This is also called myocarditis.  Lung disease. What increases the risk?  Getting older. The risk of heart failure goes up as a person ages.  Being overweight.  Being male.  Use tobacco or nicotine products.  Abusing alcohol or drugs.  Having taken medicines that can damage the heart.  Having any of these conditions: ? Diabetes. ? Abnormal heart rhythms. ? Thyroid problems. ? Low blood counts (anemia).  Having a family history of heart failure. What are the signs or symptoms?  Shortness of breath.  Coughing.  Swelling of the feet, ankles, legs, or belly.  Losing or gaining weight for no reason.  Trouble breathing.  Waking from sleep because of the need to sit up and get more air.  Fast heartbeat.  Being very tired.  Feeling dizzy, or feeling like you may pass out (faint).  Having no desire to eat.  Feeling like you may vomit (nauseous).  Peeing (urinating) more at night.  Feeling confused. How is this treated? This condition may be treated with:  Medicines. These can be given to treat blood pressure and to make the heart muscles stronger.  Changes in your daily life. These may include: ? Eating a healthy diet. ? Staying at a healthy body weight. ? Quitting tobacco, alcohol, and drug use. ? Doing exercises. ? Participating in a cardiac rehabilitation program. This  program helps you improve your health through exercise, education, and counseling.  Surgery. Surgery can be done to open blocked valves, or to put devices in the heart, such as pacemakers.  A donor heart (heart transplant). You will receive a healthy heart from a donor. Follow these instructions at home:  Treat other conditions as told by your doctor. These may include high blood pressure, diabetes, thyroid disease, or abnormal heart rhythms.  Learn as much as you can about heart failure.  Get support as you need it.  Keep all follow-up visits. Summary  Heart failure means that your heart is not able to pump blood in the right way.  This condition is often caused by high blood pressure, heart attack, or damage of the heart muscle.  Symptoms of this condition include shortness of breath and swelling of the feet, ankles, legs, or belly. You may also feel very tired or feel like you may vomit.  You may be treated with medicines, surgery, or changes in your daily life.  Treat other health conditions as told by your doctor. This information is not intended to replace advice given to you by your health care provider. Make sure you discuss any questions you have with your health care provider. Document Revised: 09/26/2019 Document Reviewed: 09/26/2019 Elsevier Patient Education  2021 Else[employer].   Heart Failure, Self-Care Heart failure is a serious condition. The following information explains things you need to do to take care of yourself at home. To help you stay as healthy  as possible, you may be asked to change your diet, take certain medicines, and make other changes in your life. Your doctor may also give you more specific instructions. If you have problems or questions, call your doctor. What are the risks? Having heart failure makes it more likely for you to have some problems. These problems can get worse if you do not take good care of yourself. Problems may include:  Damage  to the kidneys, liver, or lungs.  Malnutrition.  Abnormal heart rhythms.  Blood clotting problems that could cause a stroke. Supplies needed:  Scale for weighing yourself.  Blood pressure monitor.  Notebook.  Medicines. How to care for yourself when you have heart failure Medicines Take over-the-counter and prescription medicines only as told by your doctor. Take your medicines every day.  Do not stop taking your medicine unless your doctor tells you to do so.  Do not skip any medicines.  Get your prescriptions refilled before you run out of medicine. This is important.  Talk with your doctor if you cannot afford your medicines. Eating and drinking  Eat heart-healthy foods. Talk with a diet specialist (dietitian) to create an eating plan.  Limit salt (sodium) if told by your doctor. Ask your diet specialist to tell you which seasonings are healthy for your heart.  Cook in healthy ways instead of frying. Healthy ways of cooking include roasting, grilling, broiling, baking, poaching, steaming, and stir-frying.  Choose foods that: ? Have no trans fat. ? Are low in saturated fat and cholesterol.  Choose healthy foods, such as: ? Fresh or frozen fruits and vegetables. ? Fish. ? Low-fat (lean) meats. ? Legumes, such as beans, peas, and lentils. ? Fat-free or low-fat dairy products. ? Whole-grain foods. ? High-fiber foods.  Limit how much fluid you drink, if told by your doctor.   Alcohol use  Do not drink alcohol if: ? Your doctor tells you not to drink. ? Your heart was damaged by alcohol, or you have very bad heart failure. ? You are pregnant, may be pregnant, or are planning to become pregnant.  If you drink alcohol: ? Limit how much you have to:  0-1 drink a day for women.  0-2 drinks a day for men. ? Know how much alcohol is in your drink. In the [place] one drink equals one 12 oz bottle of beer (355 mL), one 5 oz glass of wine (148 mL), or one 1 oz glass  of hard liquor (44 mL). Lifestyle  Do not smoke or use any products that contain nicotine or tobacco. If you need help quitting, ask your doctor. ? Do not use nicotine gum or patches before talking to your doctor.  Do not use illegal drugs.  Lose weight if told by your doctor.  Do physical activity if told by your doctor. Talk to your doctor before you begin an exercise if: ? You are an older adult. ? You have very bad heart failure.  Learn to manage stress. If you need help, ask your doctor.  Get physical rehab (rehabilitation) to help you stay independent and to help with your quality of life.  Participate in a cardiac rehab program. This program helps you improve your health through exercise, education, and counseling.  Plan time to rest when you get tired.   Check weight and blood pressure  Weigh yourself every day. This will help you to know if fluid is building up in your body. ? Weigh yourself every morning after you pee (  urinate) and before you eat breakfast. ? Wear the same amount of clothing each time. ? Write down your daily weight. Give your record to your doctor.  Check and write down your blood pressure as told by your doctor.  Check your pulse as told by your doctor.   Dealing with very hot and very cold weather  If it is very hot: ? Avoid activities that take a lot of energy. ? Use air conditioning or fans, or find a cooler place. ? Avoid caffeine and alcohol. ? Wear clothing that is loose-fitting, lightweight, and light-colored.  If it is very cold: ? Avoid activities that take a lot of energy. ? Layer your clothes. ? Wear mittens or gloves, a hat, and a face covering when you go outside. ? Avoid alcohol. Follow these instructions at home:  Stay up to date with shots (vaccines). Get pneumococcal and flu (influenza) shots.  Keep all follow-up visits. Contact a doctor if:  You gain 2-3 lb (1-1.4 kg) in 24 hours or 5 lb (2.3 kg) in a week.  You have  increasing shortness of breath.  You cannot do your normal activities.  You get tired easily.  You cough a lot.  You do not feel like eating or feel like you may vomit (nauseous).  You have swelling in your hands, feet, ankles, or belly (abdomen).  You cannot sleep well because it is hard to breathe.  You feel like your heart is beating fast (palpitations).  You get dizzy when you stand up.  You feel depressed or sad. Get help right away if:  You have trouble breathing.  You or someone else notices a change in your behavior, such as having trouble staying awake.  You have chest pain or discomfort.  You pass out (faint). These symptoms may be an emergency. Get help right away. Call your local emergency services (911 in the [place]  Do not wait to see if the symptoms will go away.  Do not drive yourself to the hospital. Summary  Heart failure is a serious condition. To care for yourself, you may have to change your diet, take medicines, and make other lifestyle changes.  Take your medicines every day. Do not stop taking them unless your doctor tells you to do so.  Limit salt and eat heart-healthy foods.  Ask your doctor if you can drink alcohol. You may have to stop alcohol use if you have very bad heart failure.  Contact your doctor if you gain weight quickly or feel that your heart is beating too fast. Get help right away if you pass out or have chest pain or trouble breathing. This information is not intended to replace advice given to you by your health care provider. Make sure you discuss any questions you have with your health care provider. Document Revised: 09/26/2019 Document Reviewed: 09/26/2019 Elsevier Patient Education  2021 [employer] Inc. Please follow up with your primary doctor or cardiologist about your thyroid levels

## 2020-06-20 NOTE — Progress Notes (Signed)
Patient remains non-complaint with telemetry and pulse oximetry. Despite educating the patient multiple times, patient will not keep monitoring devices on. Dr. Robb Matar notified via [facility] communication.

## 2020-06-20 NOTE — Progress Notes (Signed)
Patient non-complaint with continuous pulse oximetry. Continually removing probe. Educated on importance of continued monitoring. Up to the bathroom. States "I am still having diarrhea". No stool noted in the collection containers in the toilet. Voided . Graham crackers and peanut butter provided per patient's request.

## 2020-06-20 NOTE — Progress Notes (Signed)
RN responded to bed alarm. Patient up unassisted. Patient has removed oxygen and continuous pulse ox probe. Reapplied again. Patient re-educated.

## 2020-06-21 ENCOUNTER — Inpatient Hospital Stay (HOSPITAL_COMMUNITY): Payer: Medicaid Other

## 2020-06-21 DIAGNOSIS — Z7189 Other specified counseling: Secondary | ICD-10-CM

## 2020-06-21 DIAGNOSIS — D508 Other iron deficiency anemias: Secondary | ICD-10-CM

## 2020-06-21 DIAGNOSIS — R1084 Generalized abdominal pain: Secondary | ICD-10-CM

## 2020-06-21 DIAGNOSIS — R161 Splenomegaly, not elsewhere classified: Secondary | ICD-10-CM

## 2020-06-21 DIAGNOSIS — Z515 Encounter for palliative care: Secondary | ICD-10-CM

## 2020-06-21 LAB — CBC
HCT: 27.3 % — ABNORMAL LOW (ref 39.0–52.0)
Hemoglobin: 6.8 g/dL — CL (ref 13.0–17.0)
MCH: 18.9 pg — ABNORMAL LOW (ref 26.0–34.0)
MCHC: 24.9 g/dL — ABNORMAL LOW (ref 30.0–36.0)
MCV: 76 fL — ABNORMAL LOW (ref 80.0–100.0)
Platelets: 75 10*3/uL — ABNORMAL LOW (ref 150–400)
RBC: 3.59 MIL/uL — ABNORMAL LOW (ref 4.22–5.81)
RDW: 23.1 % — ABNORMAL HIGH (ref 11.5–15.5)
WBC: 5 10*3/uL (ref 4.0–10.5)
nRBC: 1.8 % — ABNORMAL HIGH (ref 0.0–0.2)

## 2020-06-21 LAB — TROPONIN I (HIGH SENSITIVITY)
Troponin I (High Sensitivity): 10 ng/L (ref <18)
Troponin I (High Sensitivity): 10 ng/L (ref <18)

## 2020-06-21 LAB — BASIC METABOLIC PANEL
Anion gap: 8 (ref 5–15)
BUN: 38 mg/dL — ABNORMAL HIGH (ref 6–20)
CO2: 37 mmol/L — ABNORMAL HIGH (ref 22–32)
Calcium: 8.1 mg/dL — ABNORMAL LOW (ref 8.9–10.3)
Chloride: 90 mmol/L — ABNORMAL LOW (ref 98–111)
Creatinine, Ser: 1.02 mg/dL (ref 0.61–1.24)
GFR, Estimated: >60 mL/min (ref >60)
Glucose, Bld: 170 mg/dL — ABNORMAL HIGH (ref 70–99)
Potassium: 3.9 mmol/L (ref 3.5–5.1)
Sodium: 135 mmol/L (ref 135–145)

## 2020-06-21 LAB — BRAIN NATRIURETIC PEPTIDE: B Natriuretic Peptide: 2270.3 pg/mL — ABNORMAL HIGH (ref 0.0–100.0)

## 2020-06-21 LAB — HEMOGLOBIN AND HEMATOCRIT, BLOOD
HCT: 31.8 % — ABNORMAL LOW (ref 39.0–52.0)
Hemoglobin: 8.1 g/dL — ABNORMAL LOW (ref 13.0–17.0)

## 2020-06-21 LAB — PREPARE RBC (CROSSMATCH)

## 2020-06-21 MED ORDER — SODIUM CHLORIDE 0.9% IV SOLUTION: Freq: Once | INTRAVENOUS | Status: DC

## 2020-06-21 MED ORDER — HYDROMORPHONE HCL 1 MG/ML IJ SOLN
1.0000 mg | INTRAMUSCULAR | Status: DC | PRN
  Administered 2020-06-21 – 2020-06-25 (×25): 1 mg via INTRAVENOUS
  Filled 2020-06-21 (×26): qty 1

## 2020-06-21 MED ORDER — OXYCODONE HCL ER 10 MG PO T12A
10.0000 mg | EXTENDED_RELEASE_TABLET | Freq: Two times a day (BID) | ORAL | Status: DC
Start: 2020-06-21 — End: 2020-06-22
  Administered 2020-06-21 (×2): 10 mg via ORAL
  Filled 2020-06-21 (×2): qty 1

## 2020-06-21 MED ORDER — CIPROFLOXACIN HCL 500 MG PO TABS
500.0000 mg | ORAL_TABLET | Freq: Two times a day (BID) | ORAL | Status: AC
  Administered 2020-06-21 – 2020-06-26 (×10): 500 mg via ORAL
  Filled 2020-06-21 (×10): qty 1

## 2020-06-21 MED ORDER — HYDROCORTISONE ACETATE 25 MG RE SUPP
25.0000 mg | Freq: Two times a day (BID) | RECTAL | Status: DC
  Administered 2020-06-21 – 2020-06-24 (×2): 25 mg via RECTAL
  Filled 2020-06-21 (×7): qty 1

## 2020-06-21 MED ORDER — FUROSEMIDE 10 MG/ML IJ SOLN
40.0000 mg | Freq: Three times a day (TID) | INTRAMUSCULAR | Status: DC
  Administered 2020-06-21 – 2020-06-24 (×10): 40 mg via INTRAVENOUS
  Filled 2020-06-21 (×10): qty 4

## 2020-06-21 MED ORDER — SODIUM CHLORIDE 0.9% IV SOLUTION: Freq: Once | INTRAVENOUS | Status: AC

## 2020-06-21 MED ORDER — NICOTINE 21 MG/24HR TD PT24
21.0000 mg | MEDICATED_PATCH | Freq: Every day | TRANSDERMAL | Status: DC
  Administered 2020-06-21 – 2020-06-26 (×6): 21 mg via TRANSDERMAL
  Filled 2020-06-21 (×6): qty 1

## 2020-06-21 NOTE — Progress Notes (Signed)
Date and time results received: 06/21/20  0500  Test: Hemoglobin  Critical Value: 6.8  Name of Provider Notified: Midge Minium, MD  Please see new orders.

## 2020-06-21 NOTE — Progress Notes (Signed)
[facility]                                                                              Patient Demographics  David Short, is a 50 y.o. male, DOB - 03/17/1971, ZOX:[id number]  Admit date - 06/17/2020   Admitting Physician Charlotte SanesNicole Gonzales, MD  Outpatient Primary MD for the patient is Default, Provider, MD  Outpatient specialists:   LOS - 3  days   Medical records reviewed and are as summarized below:    Chief Complaint  Patient presents with  . Shortness of Breath       Brief summary   Patient is a 50 year old man with a history of vertebral osteomyelitis, anterior cervical discectomy,  cervical spinal fusion, Reported HX of HTN per chart, current smoker, chronic back pain, here with dyspnea for the past several weeks.  Mostly he notices dyspnea on exertion.  No dyspnea on talking.  Worsening dyspnea x 2 weeks. He also endorses orthopnea, but he is not sure of the duration (at least months).  Lower extremity edema to the knees for the past two weeks.  In the ED, patient was treated for COPD exacerbation, as well as volume overload due to CHF.Found to have a R sided pleural effusion, some atelectasis, pulm edema, ascites, anasarca.  Was given methylprednisolone, duonebs, lasix 40mg  IV, ceftriaxone and azithromycin.  Was placed on bipap due to worsening hypoxemia despite NRB, increasing work of breaking.  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  States he is not on O2 at home -2D echo showed severely enlarged right ventricular size,  moderately elevated pulmonary artery systolic pressure, 52.5 mmHg, small pericardial effusion, EF 65 to 70% - negative balance of 9.6 L -BNP 2270, will increase Lasix to 40 mg every 8 hours, chest x-ray showed vascular congestion and with a diffuse interstitial opacity  Acute COPD exacerbation -Still wheezing, O2 sats 93% on 5 L - Chest x-ray showed improved but not resolved right pleural effusion, bilateral perihilar opacity most resembling vascular congestion -Continue scheduled nebs, IV Solu-Medrol, continue Brovana, doxycycline, flutter valve   Chronic cervical/back pain, -Patient very focused on pain management, states current medication regimen is not working  -Currently on clonidine withdrawal protocol, continue oxycodone IR 10 mg every 4 hours as needed for moderate pain, added long-acting OxyContin 10 mg every 12 hours, added IV Dilaudid for breakthrough and severe pain, continue Lidoderm patch -Requested palliative medicine pain management   Likely IV heroin drug withdrawals -Continue clonidine withdrawal protocol   Anemia of chronic disease, microcytic, acute on chronic -Hemoglobin 6.8, hemoglobin was 6.5 on 4/3, was transfused 1 unit packed RBCs -Baseline hemoglobin 12-13 -Transfuse 1 unit packed RBCs today,  GI consulted.  FOBT negative. -Continue PPI  Code Status: Full CODE STATUS DVT Prophylaxis:  SCDs Start: 06/18/20 0051   Level of Care: Level of care: Telemetry Cardiac Family Communication: Discussed all imaging results, lab results, explained to the patient, mother and brother at the bedside on 4/4   Disposition Plan:     Status is: Inpatient  Remains inpatient appropriate because:Inpatient level of care appropriate due to severity of illness   Dispo: The patient is from: Home              Anticipated d/c is to: Home              Patient currently is not medically stable to d/c.  Currently on IV Lasix for diuresis, acute anemia, pain management, GI work-up    Difficult to place patient No      Time Spent in minutes 25 minutes  Procedures:  David Short  Consultants:   Admitted by PCCM  Antimicrobials:   Anti-infectives (From admission, onward)   Start     Dose/Rate Route Frequency Ordered Stop   06/21/20 2000  ciprofloxacin (CIPRO) tablet 500 mg        500 mg Oral 2 times daily 06/21/20 1426 06/26/20 1959   06/20/20 1000  doxycycline (VIBRA-TABS) tablet 100 mg        100 mg Oral Every 12 hours 06/20/20 0841     06/17/20 2345  cefTRIAXone (ROCEPHIN) 1 g in sodium chloride 0.9 % 100 mL IVPB        1 g 200 mL/hr over 30 Minutes Intravenous  Once 06/17/20 2341 06/18/20 0119   06/17/20 2345  azithromycin (ZITHROMAX) 500 mg in sodium chloride 0.9 % 250 mL IVPB        500 mg 250 mL/hr over 60 Minutes Intravenous  Once 06/17/20 2341 06/18/20 0614         Medications  Scheduled Meds: . sodium chloride   Intravenous Once  . arformoterol  15 mcg Nebulization BID  . Chlorhexidine Gluconate Cloth  6 each Topical Daily  . ciprofloxacin  500 mg Oral BID  . cloNIDine  0.1 mg Oral QID   Followed by  . [START ON 06/22/2020] cloNIDine  0.1 mg Oral BH-qamhs   Followed by  . [START ON 06/24/2020] cloNIDine  0.1 mg Oral QAC breakfast  . doxycycline  100 mg Oral Q12H  . furosemide  40 mg Intravenous BID  . hydrocortisone  25 mg Rectal BID  . lidocaine  1 patch Transdermal Q24H  . methylPREDNISolone (SOLU-MEDROL) injection  60 mg Intravenous Q8H  . nicotine  21 mg Transdermal Daily  . oxyCODONE  10 mg Oral Q12H  . pantoprazole  40 mg Oral Daily   Continuous Infusions: PRN Meds:.bismuth subsalicylate, HYDROmorphone (DILAUDID) injection, hydrOXYzine, ipratropium-albuterol, loperamide, methocarbamol, naproxen, ondansetron (ZOFRAN) IV, ondansetron, oxyCODONE, polyethylene glycol      Subjective:   Felipe Paluch was seen and examined today.  States pain is uncontrolled, current regimen is not working.  Still has wheezing.  No obvious bleeding, no  hematemesis or melena or hematochezia.  Patient denies dizziness, chest pain,  abdominal pain, nausea vomiting new weakness, numbess, tingling.  No fevers  Objective:   Vitals:   06/21/20 0900 06/21/20 0901 06/21/20 1201 06/21/20 1209  BP: (!) 138/91 (!) 138/91 (!) 131/94 130/86  Pulse: 82 82 (!) 105 100  Resp: (!) 21 18 (!) 21   Temp: 98.1 F (36.7 C) 98.1 F (36.7 C) 98.3 F (36.8 C) 98.3 F (  36.8 C)  TempSrc: Oral Oral Oral Oral  SpO2: 93% (!) 88% 93% 93%  Weight:      Height:        Intake/Output Summary (Last 24 hours) at 06/21/2020 1441 Last data filed at 06/21/2020 1201 Gross per 24 hour  Intake 1092 ml  Output 3325 ml  Net -2233 ml     Wt Readings from Last 3 Encounters:  06/21/20 64.8 kg  12/30/18 71.2 kg  08/18/16 77.1 kg   Physical Exam  General: Alert and oriented x 3, pursed lip breathing with wheezing, uncomfortable, anxious  Cardiovascular: S1 S2 clear, RRR.  Respiratory: Diffuse bilateral expiratory wheezing  Gastrointestinal: Soft, nontender, nondistended, NBS  Ext: 1+ pedal edema bilaterally  Neuro: no new deficits  Musculoskeletal: No cyanosis, clubbing  Skin: No rashes  Psych: anxious    Data Reviewed:  I have personally reviewed following labs and imaging studies  Micro Results Recent Results (from the past 240 hour(s))  Resp Panel by RT-PCR (Flu A&B, Covid) Nasopharyngeal Swab     Status: David Short   Collection Time: 06/17/20  9:49 PM   Specimen: Nasopharyngeal Swab; Nasopharyngeal(NP) swabs in vial transport medium  Result Value Ref Range Status   SARS Coronavirus 2 by RT PCR NEGATIVE NEGATIVE Final    Comment: (NOTE) SARS-CoV-2 target nucleic acids are NOT DETECTED.  The SARS-CoV-2 RNA is generally detectable in upper respiratory specimens during the acute phase of infection. The lowest concentration of SARS-CoV-2 viral copies this assay can detect is 138 copies/mL. A negative result does not preclude SARS-Cov-2 infection and  should not be used as the sole basis for treatment or other patient management decisions. A negative result may occur with  improper specimen collection/handling, submission of specimen other than nasopharyngeal swab, presence of viral mutation(s) within the areas targeted by this assay, and inadequate number of viral copies(<138 copies/mL). A negative result must be combined with clinical observations, patient history, and epidemiological information. The expected result is Negative.  Fact Sheet for Patients:  BloggerCourse.com  Fact Sheet for Healthcare Providers:  SeriousBroker.it  This test is no t yet approved or cleared by the [place] FDA and  has been authorized for detection and/or diagnosis of SARS-CoV-2 by FDA under an Emergency Use Authorization (EUA). This EUA will remain  in effect (meaning this test can be used) for the duration of the COVID-19 declaration under Section 564(b)(1) of the Act, 21 U.S.C.section 360bbb-3(b)(1), unless the authorization is terminated  or revoked sooner.       Influenza A by PCR NEGATIVE NEGATIVE Final   Influenza B by PCR NEGATIVE NEGATIVE Final    Comment: (NOTE) The Xpert Xpress SARS-CoV-2/FLU/RSV plus assay is intended as an aid in the diagnosis of influenza from Nasopharyngeal swab specimens and should not be used as a sole basis for treatment. Nasal washings and aspirates are unacceptable for Xpert Xpress SARS-CoV-2/FLU/RSV testing.  Fact Sheet for Patients: BloggerCourse.com  Fact Sheet for Healthcare Providers: SeriousBroker.it  This test is not yet approved or cleared by the [place] FDA and has been authorized for detection and/or diagnosis of SARS-CoV-2 by FDA under an Emergency Use Authorization (EUA). This EUA will remain in effect (meaning this test can be used) for the duration of the COVID-19 declaration  under Section 564(b)(1) of the Act, 21 U.S.C. section 360bbb-3(b)(1), unless the authorization is terminated or revoked.  Performed at [facility], [address] [place], [place] [address]     Radiology Reports CT Head  Wo Contrast  Result Date: 06/17/2020 CLINICAL DATA:  Abnormal mental status, head trauma EXAM: CT HEAD WITHOUT CONTRAST TECHNIQUE: Contiguous axial images were obtained from the base of the skull through the vertex without intravenous contrast. COMPARISON:  MRI 12/13/2018 FINDINGS: Brain: No acute intracranial abnormality. Specifically, no hemorrhage, hydrocephalus, mass lesion, acute infarction, or significant intracranial injury. Vascular: No hyperdense vessel or unexpected calcification. Skull: No acute calvarial abnormality. Sinuses/Orbits: No acute findings. Mucosal thickening in the paranasal sinuses. Mastoid air cells clear. Other: David Short IMPRESSION: No acute intracranial abnormality. Electronically Signed   By: Charlett Nose M.D.   On: 06/17/2020 22:48   CT Chest W Contrast  Result Date: 06/17/2020 CLINICAL DATA:  Shortness of breath for 2 weeks with altered mental status, swelling and edema in the upper and lower extremities, 50% on room air EXAM: CT CHEST, ABDOMEN, AND PELVIS WITH CONTRAST TECHNIQUE: Multidetector CT imaging of the chest, abdomen and pelvis was performed following the standard protocol during bolus administration of intravenous contrast. CONTRAST:  OMNIPAQUE IOHEXOL 300 MG/ML  SOLN COMPARISON:  Radiograph 06/17/2020 renal ultrasound 03/17/2012, CT 03/01/2012 FINDINGS: CT CHEST FINDINGS Cardiovascular: Borderline cardiomegaly with trace pericardial effusion. Extensive three-vessel coronary artery atherosclerosis. The aortic root is suboptimally assessed given cardiac pulsation artifact. Atherosclerotic plaque within the normal caliber aorta. No acute luminal abnormality of the imaged aorta. No periaortic stranding or hemorrhage. Normal 3 vessel  branching of the aortic arch. Proximal great vessels are mildly calcified but otherwise unremarkable. Central pulmonary arteries are enlarged. No large central filling defects are evident within the limitations of this non tailored examination of the pulmonary arteries. Mediastinum/Nodes: Low-attenuation, borderline enlarged mediastinal and hilar lymph nodes are present including a 12 mm paratracheal lymph node (4/23), and 11 mm AP window lymph node (4/21) and a 12 mm left hilar lymph node (6/111). No acute abnormality of the trachea or esophagus. Thyroid gland and thoracic inlet are unremarkable. Lungs/Pleura: Large right and small left pleural effusion with areas of fairly uniformly enhancing atelectatic passive and dependent atelectasis though scattered fluid-filled and thickened airways are noted particularly towards the lung bases and some underlying degree of aspiration is not excluded. No pneumothorax. No other focal airspace opacity. No concerning pulmonary nodules or masses. Musculoskeletal: No acute osseous abnormality or suspicious osseous lesion. CT ABDOMEN PELVIS FINDINGS Hepatobiliary: No concerning focal liver lesion. Diffuse periportal edema. Smooth liver surface contour. No significant gallbladder wall thickening. Pericholecystic fluid is likely related to redistributed ascites throughout the abdomen. Pancreas: No pancreatic ductal dilatation or surrounding inflammatory changes. Spleen: Slightly heterogeneous enhancement of the spleen may be related to contrast timing. Splenomegaly is present. Measuring up to 18 cm in craniocaudal dimension. Normal splenic cleft. No concerning splenic lesion. Adrenals/Urinary Tract: Normal adrenal glands. Symmetrically delayed renal enhancement and excretion. No visible focal renal lesion. No urolithiasis or hydronephrosis. Urinary bladder is unremarkable for degree of distension. Stomach/Bowel: Stomach and duodenum unremarkable. Slightly edematous appearance of the  distal small bowel may be related to presence of moderate volume ascites and presumed volume third-spacing. No colonic dilatation or wall thickening. No evidence of bowel obstruction. The appendix is not well visualized. Vascular/Lymphatic: Atherosclerotic calcifications within the abdominal aorta and branch vessels. No aneurysm or ectasia. No enlarged abdominopelvic lymph nodes. Edematous appearing mesenteric nodes as well as some borderline enlarged though low-attenuation bilateral inguinal nodes. No other conspicuous enlarged abdominopelvic nodes. Reproductive: Coarse eccentric calcification of the prostate. No concerning abnormalities of the prostate or seminal vesicles. A markedly edematous changes of the soft  tissues of the included internal genitalia. Other: Moderate volume ascites with additional edematous changes throughout the mesentery. Extensive circumferential body wall edema noted as well. Tiny fat containing umbilical hernia. No bowel containing hernias. Musculoskeletal: Bony fusion across the L1-2 L3 levels may reflect sequela of prior infection or injury. Given that this is a new finding since 2013. No other acute or conspicuous osseous abnormalities. Bones of the pelvis appear intact and congruent. IMPRESSION: 1. Extensive features of anasarca with circumferential body wall edema, pleural and pericardial effusions, periportal edema, moderate volume ascites and edematous changes of the mesentery. 2. Uniformly enhancing atelectatic passive and dependent atelectasis adjacent the basilar effusions though scattered fluid-filled and thickened airways are noted particularly towards the lung bases and some underlying degree of aspiration is not excluded. 3. Low-attenuation, borderline enlarged mediastinal hilar, and abdominopelvic nodes are nonspecific but could be reactive or edematous given features elsewhere though follow-up imaging may be warranted given the presence of splenomegaly as well. 4. Delayed  renal enhancement bilaterally, nonspecific though should correlate for clinical of diminished renal function. 5. Bony fusion across the L1-2 L3 levels may reflect sequela of prior infection or injury. New finding since 2013. 6. Aortic Atherosclerosis (ICD10-I70.0). 7. Coronary artery calcifications are present. Please note that the presence of coronary artery calcium documents the presence of coronary artery disease, the severity of this disease and any potential stenosis cannot be assessed on this non-gated CT examination. Electronically Signed   By: Kreg Shropshire M.D.   On: 06/17/2020 23:07   CT ABDOMEN PELVIS W CONTRAST  Result Date: 06/17/2020 CLINICAL DATA:  Shortness of breath for 2 weeks with altered mental status, swelling and edema in the upper and lower extremities, 50% on room air EXAM: CT CHEST, ABDOMEN, AND PELVIS WITH CONTRAST TECHNIQUE: Multidetector CT imaging of the chest, abdomen and pelvis was performed following the standard protocol during bolus administration of intravenous contrast. CONTRAST:  OMNIPAQUE IOHEXOL 300 MG/ML  SOLN COMPARISON:  Radiograph 06/17/2020 renal ultrasound 03/17/2012, CT 03/01/2012 FINDINGS: CT CHEST FINDINGS Cardiovascular: Borderline cardiomegaly with trace pericardial effusion. Extensive three-vessel coronary artery atherosclerosis. The aortic root is suboptimally assessed given cardiac pulsation artifact. Atherosclerotic plaque within the normal caliber aorta. No acute luminal abnormality of the imaged aorta. No periaortic stranding or hemorrhage. Normal 3 vessel branching of the aortic arch. Proximal great vessels are mildly calcified but otherwise unremarkable. Central pulmonary arteries are enlarged. No large central filling defects are evident within the limitations of this non tailored examination of the pulmonary arteries. Mediastinum/Nodes: Low-attenuation, borderline enlarged mediastinal and hilar lymph nodes are present including a 12 mm paratracheal  lymph node (4/23), and 11 mm AP window lymph node (4/21) and a 12 mm left hilar lymph node (6/111). No acute abnormality of the trachea or esophagus. Thyroid gland and thoracic inlet are unremarkable. Lungs/Pleura: Large right and small left pleural effusion with areas of fairly uniformly enhancing atelectatic passive and dependent atelectasis though scattered fluid-filled and thickened airways are noted particularly towards the lung bases and some underlying degree of aspiration is not excluded. No pneumothorax. No other focal airspace opacity. No concerning pulmonary nodules or masses. Musculoskeletal: No acute osseous abnormality or suspicious osseous lesion. CT ABDOMEN PELVIS FINDINGS Hepatobiliary: No concerning focal liver lesion. Diffuse periportal edema. Smooth liver surface contour. No significant gallbladder wall thickening. Pericholecystic fluid is likely related to redistributed ascites throughout the abdomen. Pancreas: No pancreatic ductal dilatation or surrounding inflammatory changes. Spleen: Slightly heterogeneous enhancement of the spleen may be related to  contrast timing. Splenomegaly is present. Measuring up to 18 cm in craniocaudal dimension. Normal splenic cleft. No concerning splenic lesion. Adrenals/Urinary Tract: Normal adrenal glands. Symmetrically delayed renal enhancement and excretion. No visible focal renal lesion. No urolithiasis or hydronephrosis. Urinary bladder is unremarkable for degree of distension. Stomach/Bowel: Stomach and duodenum unremarkable. Slightly edematous appearance of the distal small bowel may be related to presence of moderate volume ascites and presumed volume third-spacing. No colonic dilatation or wall thickening. No evidence of bowel obstruction. The appendix is not well visualized. Vascular/Lymphatic: Atherosclerotic calcifications within the abdominal aorta and branch vessels. No aneurysm or ectasia. No enlarged abdominopelvic lymph nodes. Edematous appearing  mesenteric nodes as well as some borderline enlarged though low-attenuation bilateral inguinal nodes. No other conspicuous enlarged abdominopelvic nodes. Reproductive: Coarse eccentric calcification of the prostate. No concerning abnormalities of the prostate or seminal vesicles. A markedly edematous changes of the soft tissues of the included internal genitalia. Other: Moderate volume ascites with additional edematous changes throughout the mesentery. Extensive circumferential body wall edema noted as well. Tiny fat containing umbilical hernia. No bowel containing hernias. Musculoskeletal: Bony fusion across the L1-2 L3 levels may reflect sequela of prior infection or injury. Given that this is a new finding since 2013. No other acute or conspicuous osseous abnormalities. Bones of the pelvis appear intact and congruent. IMPRESSION: 1. Extensive features of anasarca with circumferential body wall edema, pleural and pericardial effusions, periportal edema, moderate volume ascites and edematous changes of the mesentery. 2. Uniformly enhancing atelectatic passive and dependent atelectasis adjacent the basilar effusions though scattered fluid-filled and thickened airways are noted particularly towards the lung bases and some underlying degree of aspiration is not excluded. 3. Low-attenuation, borderline enlarged mediastinal hilar, and abdominopelvic nodes are nonspecific but could be reactive or edematous given features elsewhere though follow-up imaging may be warranted given the presence of splenomegaly as well. 4. Delayed renal enhancement bilaterally, nonspecific though should correlate for clinical of diminished renal function. 5. Bony fusion across the L1-2 L3 levels may reflect sequela of prior infection or injury. New finding since 2013. 6. Aortic Atherosclerosis (ICD10-I70.0). 7. Coronary artery calcifications are present. Please note that the presence of coronary artery calcium documents the presence of  coronary artery disease, the severity of this disease and any potential stenosis cannot be assessed on this non-gated CT examination. Electronically Signed   By: Kreg Shropshire M.D.   On: 06/17/2020 23:07   DG Chest Port 1 View  Result Date: 06/21/2020 CLINICAL DATA:  Shortness of breath. EXAM: PORTABLE CHEST 1 VIEW COMPARISON:  Chest x-ray 06/20/2020.  Chest CT 06/17/2020. FINDINGS: Heart size upper normal. Vascular congestion again noted with diffuse interstitial opacity. Hazy opacity at the right base compatible with known pleural effusion seen on previous CT. The visualized bony structures of the thorax show no acute abnormality. Telemetry leads overlie the chest. IMPRESSION: No substantial change since chest x-ray from yesterday. Electronically Signed   By: Kennith Center M.D.   On: 06/21/2020 13:30   DG Chest Port 1 View  Result Date: 06/20/2020 CLINICAL DATA:  50 year old male with chest pain and shortness of breath. Smoker. EXAM: PORTABLE CHEST 1 VIEW COMPARISON:  CT Chest, Abdomen, and Pelvis 06/17/2020 and earlier. FINDINGS: Portable AP semi upright view at 0901 hours. Significantly regressed right pleural effusion. Small residual suspected. Decreased but not resolved bilateral perihilar interstitial opacity slightly greater on the right. Normal cardiac size and mediastinal contours. Visualized tracheal air column is within normal limits. No pneumothorax. No areas  of worsening ventilation. Paucity of bowel gas in the upper abdomen. No acute osseous abnormality identified. Prior cervical ACDF. IMPRESSION: 1. Regressed but not resolved right pleural effusion and bilateral perihilar opacity, most resembling vascular congestion. 2. No new cardiopulmonary abnormality. Electronically Signed   By: Odessa Fleming M.D.   On: 06/20/2020 09:16   DG Chest Portable 1 View  Result Date: 06/17/2020 CLINICAL DATA:  Shortness of breath EXAM: PORTABLE CHEST 1 VIEW COMPARISON:  12/12/2018 FINDINGS: Shallow inspiration. Small  to moderate right pleural effusion with consolidation in the right lung base. Linear atelectasis or infiltration in the left base. Cardiac enlargement. No vascular congestion. Calcification of the aorta. IMPRESSION: Small to moderate right pleural effusion with consolidation in the right lung base. Linear atelectasis or infiltration in the left base. Electronically Signed   By: Burman Nieves M.D.   On: 06/17/2020 20:18   ECHOCARDIOGRAM COMPLETE  Result Date: 06/18/2020    ECHOCARDIOGRAM REPORT   Patient Name:   Liliana Cline Date of Exam: 06/18/2020 Medical Rec #:  [id number]         Height:       72.0 in Accession #:    [id number]        Weight:       166.0 lb Date of Birth:  February 06, 1971         BSA:          1.968 m Patient Age:    49 years          BP:           115/71 mmHg Patient Gender: M                 HR:           101 bpm. Exam Location:  Inpatient Procedure: 2D Echo, Color Doppler and Cardiac Doppler Indications:    I51.7 Cardiomegaly; Acute Respiratory Distress R06.03  History:        Patient has no prior history of Echocardiogram examinations.  Sonographer:    Tiffany Dance Referring Phys: [id number] Charlotte Sanes IMPRESSIONS  1. Right ventricular systolic function is normal. The right ventricular size is severely enlarged. There is moderately elevated pulmonary artery systolic pressure. The estimated right ventricular systolic pressure is 52.5 mmHg. Right ventricular apex not well visualized. Coronary sinus dilation consistent with right ventricular overload.  2. Right atrial size was severely dilated.  3. A small pericardial effusion is present. The pericardial effusion is circumferential. There is no evidence of increased pericardial pressure.  4. Left ventricular ejection fraction, by estimation, is 65 to 70%. The left ventricle has normal function. The left ventricle has no regional wall motion abnormalities. There is mild concentric left ventricular hypertrophy. Left ventricular diastolic  parameters were normal. There is the interventricular septum is flattened in systole, consistent with right ventricular pressure overload.  5. Tricuspid valve regurgitation is moderate and eccentric best seen in A4c view .  6. Left atrial size was mildly dilated.  7. The mitral valve is normal in structure. Trivial mitral valve regurgitation.  8. The aortic valve is tricuspid. Aortic valve regurgitation is not visualized. No aortic stenosis is present.  9. The inferior vena cava is dilated in size with <50% respiratory variability, suggesting right atrial pressure of 15 mmHg. Comparison(s): No prior Echocardiogram. FINDINGS  Left Ventricle: Left ventricular ejection fraction, by estimation, is 65 to 70%. The left ventricle has normal function. The left ventricle has no regional wall motion abnormalities. The left ventricular  internal cavity size was normal in size. There is  mild concentric left ventricular hypertrophy. The interventricular septum is flattened in systole, consistent with right ventricular pressure overload. Left ventricular diastolic parameters were normal. Right Ventricle: The right ventricular size is severely enlarged. No increase in right ventricular wall thickness. Right ventricular systolic function is normal. There is moderately elevated pulmonary artery systolic pressure. The tricuspid regurgitant velocity is 3.06 m/s, and with an assumed right atrial pressure of 15 mmHg, the estimated right ventricular systolic pressure is 52.5 mmHg. Left Atrium: Left atrial size was mildly dilated. Right Atrium: Right atrial size was severely dilated. Pericardium: A small pericardial effusion is present. The pericardial effusion is circumferential. There is no evidence of cardiac tamponade. Mitral Valve: The mitral valve is normal in structure. Trivial mitral valve regurgitation. Tricuspid Valve: The tricuspid valve is normal in structure. Tricuspid valve regurgitation is moderate . No evidence of  tricuspid stenosis. Aortic Valve: The aortic valve is tricuspid. Aortic valve regurgitation is not visualized. No aortic stenosis is present. Pulmonic Valve: Discrepancy between Spectral Doppler and Color Doppler. The pulmonic valve was grossly normal. Pulmonic valve regurgitation is not visualized. No evidence of pulmonic stenosis. Aorta: The aortic root and ascending aorta are structurally normal, with no evidence of dilitation. Venous: The inferior vena cava is dilated in size with less than 50% respiratory variability, suggesting right atrial pressure of 15 mmHg. IAS/Shunts: The atrial septum is grossly normal.  LEFT VENTRICLE PLAX 2D LVIDd:         4.80 cm  Diastology LVIDs:         2.30 cm  LV e' medial:    11.00 cm/s LV PW:         1.30 cm  LV E/e' medial:  8.5 LV IVS:        1.10 cm  LV e' lateral:   10.30 cm/s LVOT diam:     2.00 cm  LV E/e' lateral: 9.0 LV SV:         75 LV SV Index:   38 LVOT Area:     3.14 cm  RIGHT VENTRICLE             IVC RV Basal diam:  3.80 cm     IVC diam: 3.30 cm RV Mid diam:    2.80 cm RV S prime:     16.50 cm/s TAPSE (M-mode): 1.9 cm LEFT ATRIUM              Index       RIGHT ATRIUM           Index LA diam:        5.60 cm  2.84 cm/m  RA Area:     28.50 cm LA Vol (A2C):   122.0 ml 61.98 ml/m RA Volume:   111.00 ml 56.39 ml/m LA Vol (A4C):   46.7 ml  23.73 ml/m LA Biplane Vol: 75.8 ml  38.51 ml/m  AORTIC VALVE LVOT Vmax:   123.00 cm/s LVOT Vmean:  82.400 cm/s LVOT VTI:    0.239 m  AORTA Ao Root diam: 3.60 cm Ao Asc diam:  3.30 cm MITRAL VALVE               TRICUSPID VALVE MV Area (PHT): 3.48 cm    TR Peak grad:   37.5 mmHg MV Decel Time: 218 msec    TR Vmax:        306.00 cm/s MV E velocity: 93.00 cm/s MV A velocity: 88.10 cm/s  SHUNTS MV E/A ratio:  1.06        Systemic VTI:  0.24 m                            Systemic Diam: 2.00 cm Riley Lam MD Electronically signed by Riley Lam MD Signature Date/Time: 06/18/2020/4:36:14 PM    Final     Lab  Data:  CBC: Recent Labs  Lab 06/17/20 1948 06/17/20 2006 06/18/20 1027 06/19/20 0108 06/19/20 0735 06/20/20 0645 06/21/20 0416 06/21/20 1323  WBC 9.2  --  8.2 10.9* 11.3* 8.6 5.0  --   NEUTROABS 6.7  --  7.2  --   --   --   --   --   HGB 6.4*   < > 6.9* 6.5* 8.0* 7.9* 6.8* 8.1*  HCT 29.4*   < > 29.4* 27.2* 32.5* 31.8* 27.3* 31.8*  MCV 76.8*  --  76.4* 76.2* 76.8* 78.3* 76.0*  --   PLT 127*  --  101* 102* 89* 92* 75*  --    < > = values in this interval not displayed.   Basic Metabolic Panel: Recent Labs  Lab 06/17/20 1948 06/17/20 2006 06/17/20 2356 06/18/20 2536 06/18/20 0940 06/19/20 0108 06/20/20 0645 06/21/20 0416  NA 134*   < > 135  --  131* 136 135 135  K 5.9*   < > 5.8*  --  5.6* 5.1 4.4 3.9  CL 104  --   --   --  100 99 94* 90*  CO2 26  --   --   --  22 34* 37* 37*  GLUCOSE 93  --   --   --  223* 143* 70 170*  BUN 43*  --   --   --  41* 44* 38* 38*  CREATININE 1.68*  --   --  1.57* 1.61* 1.52* 1.23 1.02  CALCIUM 8.2*  --   --   --  7.9* 8.1* 8.1* 8.1*   < > = values in this interval not displayed.   GFR: Estimated Creatinine Clearance: 79.4 mL/min (by C-G formula based on SCr of 1.02 mg/dL). Liver Function Tests: Recent Labs  Lab 06/17/20 1948 06/18/20 0940  AST 34 27  ALT 15 14  ALKPHOS 105 87  BILITOT 1.4* 1.0  PROT 8.2* 7.4  ALBUMIN 2.2* 1.9*   No results for input(s): LIPASE, AMYLASE in the last 168 hours. Recent Labs  Lab 06/17/20 1958  AMMONIA 52*   Coagulation Profile: Recent Labs  Lab 06/17/20 2101  INR 1.5*   Cardiac Enzymes: No results for input(s): CKTOTAL, CKMB, CKMBINDEX, TROPONINI in the last 168 hours. BNP (last 3 results) No results for input(s): PROBNP in the last 8760 hours. HbA1C: No results for input(s): HGBA1C in the last 72 hours. CBG: No results for input(s): GLUCAP in the last 168 hours. Lipid Profile: No results for input(s): CHOL, HDL, LDLCALC, TRIG, CHOLHDL, LDLDIRECT in the last 72 hours. Thyroid  Function Tests: No results for input(s): TSH, T4TOTAL, FREET4, T3FREE, THYROIDAB in the last 72 hours. Anemia Panel: Recent Labs    06/20/20 1438  VITAMINB12 633  FOLATE 6.4  FERRITIN 14*  TIBC 440  IRON 20*  RETICCTPCT 1.7   Urine analysis:    Component Value Date/Time   COLORURINE YELLOW 06/17/2020 2340   APPEARANCEUR HAZY (A) 06/17/2020 2340   LABSPEC 1.010 06/17/2020 2340   PHURINE 5.0 06/17/2020 2340   GLUCOSEU NEGATIVE 06/17/2020 2340  HGBUR LARGE (A) 06/17/2020 2340   BILIRUBINUR NEGATIVE 06/17/2020 2340   KETONESUR NEGATIVE 06/17/2020 2340   PROTEINUR 30 (A) 06/17/2020 2340   UROBILINOGEN 1.0 03/17/2012 0047   NITRITE NEGATIVE 06/17/2020 2340   LEUKOCYTESUR NEGATIVE 06/17/2020 2340     Jamiee Milholland M.D. [facility] 06/21/2020, 2:41 PM  Available via Epic secure chat 7am-7pm After 7 pm, please refer to night coverage provider listed on amion.

## 2020-06-21 NOTE — Consult Note (Addendum)
Consultation Note Date: 06/21/2020   Patient Name: David Short  DOB: 12/23/1970  MRN: [id number]  Age / Sex: 50 y.o., male  PCP: Default, Provider, MD Referring Physician: Cathren Harsh, MD  Reason for Consultation: Establishing goals of care and Psychosocial/spiritual support  HPI/Patient Profile: 50 y.o. male  admitted on 06/17/2020 with a history of vertebral osteomyelitis, anterior cervical discectomy,  cervical spinal fusion, Reported HX of HTN per chart, current smoker, chronic back pain, here with dyspnea for the past several weeks.  Mostly he notices dyspnea on exertion.  No dyspnea on talking.  Worsening dyspnea x 2 weeks. He also endorses orthopnea, but he is not sure of the duration (at least months).  Lower extremity edema to the knees for the past two weeks.   No primary care in his life  In the ED he was treated for COPD exacerbation, as well as volume overload 2/2 CHF.   Found to have a R sided pleural effusion, some atelectasis, pulm edema, ascites, anasarca. Was given methylprednisolone, duonebs, lasix 40mg  IV, ceftriaxone and azithromycin.   Was placed on bipap due to worsening hypoxemia despite NRB, increasing work of breathing.  Dense, long h/o substance misuse.   Patient and family face treatment option decisions, advanced directive decisions and anticipatory care needs.     Clinical Assessment and Goals of Care:   This NP reviewed medical records, received report from team, assessed the patient and then meet at the patient's bedside along with his sister/Jennifer to discuss diagnosis, prognosis, GOC, EOL wishes disposition and options.   Concept of Palliative Care was introduced as specialized medical care for people and their families living with serious illness.  If focuses on providing relief from the symptoms and stress of a serious illness.  The goal is to  improve quality of life for both the patient and the family.  Values and goals of care important to patient and family were attempted to be elicited.   Created space and opportunity for patient  and family to explore thoughts and feelings regarding current medical situation.   Patient has limited insight into his complex medical situation.  Education ofered on his multiple co-morbid ites.   Exploration of his substance use is reported back to age 38/first joint of marijuana, age 36 cocaine/, many years of heavy ETOHuse/ stopped in 2004.  Reports being injured after a fall, pain was managed by an orthopedic physician at that time,  and when released from the practice found relief from his pain in street drugs.  Has never participated in structured rehab  He lives with his aunt, in [place].  Education offered on his overall health and wellness.  Need for primary healthcare and follow-up.  Also we explored possible options to seek help for his drug use.  Dense psyhco-social issues.  A  discussion was had today regarding advanced directives.  Concepts specific to code status, artifical feeding and hydration, continued IV antibiotics and rehospitalization was had.      MOST form introduced .  Questions and concerns addressed.  Patient  encouraged to call with questions or concerns.     PMT will continue to support holistically.             NEXT OF KIN/mother    SUMMARY OF RECOMMENDATIONS    Code Status/Advance Care Planning:  Full code  Encouraged patient/family to consider DNR/DNI status understanding evidenced based poor outcomes in similar hospitalized patient, as the cause of arrest is likely associated with advanced chronic illness rather than an easily reversible acute cardio-pulmonary event.   Palliative Prophylaxis:   Bowel Regimen, Delirium Protocol, Frequent Pain Assessment and Oral Care  Additional Recommendations (Limitations, Scope, Preferences):  Full  Scope Treatment  Psycho-social/Spiritual:   Desire for further Chaplaincy support:yes  Additional Recommendations: Patient shares his struggles with "life in general",  He feels judged at times.  He is open to visits from chaplain.    Prognosis:   Unable to determine  Discharge Planning: To Be Determined      Primary Diagnoses: Present on Admission: **None**   I have reviewed the medical record, interviewed the patient and family, and examined the patient. The following aspects are pertinent.  Past Medical History:  Diagnosis Date  . Chronic back pain   . HTN (hypertension)   . IVDU (intravenous drug user)    heroin   . Osteomyelitis (HCC)   . Tobacco use    Social History   Socioeconomic History  . Marital status: Single    Spouse name: Not on file  . Number of children: Not on file  . Years of education: Not on file  . Highest education level: Not on file  Occupational History  . Not on file  Tobacco Use  . Smoking status: Current Every Day Smoker    Packs/day: 1.00    Types: Cigarettes  . Smokeless tobacco: Never Used  Vaping Use  . Vaping Use: Never used  Substance and Sexual Activity  . Alcohol use: Not Currently  . Drug use: Not Currently    Types: IV, Heroin  . Sexual activity: Not on file  Other Topics Concern  . Not on file  Social History Narrative  . Not on file   Social Determinants of Health   Financial Resource Strain: Not on file  Food Insecurity: Not on file  Transportation Needs: Not on file  Physical Activity: Not on file  Stress: Not on file  Social Connections: Not on file   History reviewed. No pertinent family history. Scheduled Meds: . sodium chloride   Intravenous Once  . arformoterol  15 mcg Nebulization BID  . Chlorhexidine Gluconate Cloth  6 each Topical Daily  . cloNIDine  0.1 mg Oral QID   Followed by  . [START ON 06/22/2020] cloNIDine  0.1 mg Oral BH-qamhs   Followed by  . [START ON 06/24/2020] cloNIDine  0.1 mg  Oral QAC breakfast  . doxycycline  100 mg Oral Q12H  . furosemide  40 mg Intravenous BID  . hydrocortisone  25 mg Rectal BID  . lidocaine  1 patch Transdermal Q24H  . methylPREDNISolone (SOLU-MEDROL) injection  60 mg Intravenous Q8H  . nicotine  21 mg Transdermal Daily  . oxyCODONE  10 mg Oral Q12H  . pantoprazole  40 mg Oral Daily   Continuous Infusions: PRN Meds:.bismuth subsalicylate, dicyclomine, HYDROmorphone (DILAUDID) injection, hydrOXYzine, ipratropium-albuterol, loperamide, methocarbamol, naproxen, ondansetron (ZOFRAN) IV, ondansetron, oxyCODONE, polyethylene glycol Medications Prior to Admission:  Prior to Admission medications   Not on File   Allergies  Allergen Reactions  . Tylenol [Acetaminophen] Hives   Review of Systems  Constitutional:       "I just feel bad"    Physical Exam Constitutional:      Appearance: He is cachectic. He is ill-appearing.     Interventions: Nasal cannula in place.  Cardiovascular:     Rate and Rhythm: Normal rate.  Pulmonary:     Breath sounds: Normal breath sounds.  Abdominal:     General: There is distension.  Skin:    General: Skin is warm and dry.  Neurological:     Mental Status: He is alert.  Psychiatric:        Attention and Perception: Attention normal.        Behavior: Behavior is agitated.     Vital Signs: BP (!) 138/91 (BP Location: Left Arm)   Pulse 82   Temp 98.1 F (36.7 C) (Oral)   Resp 18   Ht 6' (1.829 m)   Wt 64.8 kg   SpO2 (!) 88%   BMI 19.38 kg/m  Pain Scale: 0-10   Pain Score: 6    SpO2: SpO2: (!) 88 % O2 Device:SpO2: (!) 88 % O2 Flow Rate: .O2 Flow Rate (L/min): 3 L/min  IO: Intake/output summary:   Intake/Output Summary (Last 24 hours) at 06/21/2020 1205 Last data filed at 06/21/2020 1201 Gross per 24 hour  Intake 1092 ml  Output 3575 ml  Net -2483 ml    LBM: Last BM Date: 06/21/20 Baseline Weight: Weight: 72.6 kg Most recent weight: Weight: 64.8 kg     Palliative  Assessment/Data:   Discussed with Dr Isidoro Donning.  This man's situation is heavy laddered with psycho-social issues.  His addition and chronic pain needs to be addressed by a specialist on discharge.  Will discuss with SW  Time In: 1200 Time Out: 1315 Time Total: 75 minutes Greater than 50%  of this time was spent counseling and coordinating care related to the above assessment and plan.  Signed by: Lorinda Creed, NP   Please contact Palliative Medicine Team phone at [phone] for questions and concerns.  For individual provider: See Loretha Stapler

## 2020-06-21 NOTE — Progress Notes (Signed)
Patient wants all pain medicine as often as possible, states that he is used to taking his "meds" at home.  By his "meds" he explained he means heroin, he says it stops his otherwise constant diarrhea and that it aids with pain relief having a sever fall in the past. He became tearful when talking to me about it because he does not want to be seen as an addict- he has pain.

## 2020-06-21 NOTE — Consult Note (Addendum)
Referring Provider: Dr. Estill Cotta Primary Care Physician:  Default, Provider, MD Primary Gastroenterologist:  Althia Forts   Reason for Consultation:  Anemia, FOBT negative   HPI: David Short is a 50 y.o. male past medical history of hypertension, IV drug use (Heroin and Fentanyl), chronic back pain and vertebral osteomyelitis.   He developed shortness of breath with swelling to his legs approximately 2 weeks ago which progressively worsened so he called 911. Per EMS his oxygen saturations were 54% on room air.  He was placed on NRB and his oxygen saturations went up to 96%.  He was transferred to [facility] ED on 06/17/2020 for further evaluation.  In the ED, labs showed a potassium level of 5.9.  Creatinine 1.68.  BUN 43.  Alk phos 105.  Albumin 2.2.  AST 34.  ALT 15.  Ammonia level 52.  Total bili 1.4.  WBC 9.2.  Hemoglobin 6.4.  Hematocrit 29.4.  MCV 76.8.  Platelet 127.  INR 1.5.  SARS coronavirus 2 negative. A chest x-ray showed evidence of a right sided pleural effusion with linear atelectasis or infiltration in the left base. He required BiPAP due to worsening hypoxemia.  He he was given Lasix 40 mg IV and started on Ceftriaxone and Azithromycin. His respiratory status worsened and he was admitted into the ICU. He did not require intubation. CTAP with contrast 4/1 showed extensive anasarca, pleural and pericardial effusions, periportal edema, moderate volume of ascites, borderline enlarged mediastinal hilar nodes, nonspecific enlarged abd/pelvic nodes and edematous appearing mesenteric nodes. Splenomegaly. Slightly edematous appearance of the distal small bowel, likely due to ascites. Liver, pancreas and colon were normal. A 2D echo showed severely enlarged right ventricular size with normal RV function,  moderately elevated pulmonary artery systolic pressure, small pericardial effusion with EF 65 to 70%. His respiratory stabilized and he was transferred to the medical floor on 4/4.  A GI consult was requested due to persistent anemia with a negative FOBT.   He reported having SOB and swelling to his legs for about 2 weeks which progressively worsened with intermittent chest pain. He developed epigastric pain about one week ago. No N/V. No dysphagia or heartburn. He is passing a normal formed brown BM daily as long as he takes his IV drugs. He passed a normal formed brown BM without rectal bleeding or melena today as reported by the patient and confirmed by his RN.  If he stops using Heroin/Fentanyl he develops diarrhea. No history of GI bleed or ulcers. Past history of alcohol abuse. He drank 1 cas of beer daily x 15 years, quit drinking alcohol in 2004 or 2005. No NSAID use. He smokes cigarettes 1ppd x 15 years.  He has chills and sweats if he does not use IV drugs. He is unsure if he has lost any weight. He stated he has not seen a physician for routine health care for many years. He's never had a colonoscopy. His father died from colon cancer at the age of 51.   CTAP with contrast 06/17/2020: 1. Extensive features of anasarca with circumferential body wall edema, pleural and pericardial effusions, periportal edema, moderate volume ascites and edematous changes of the mesentery. 2. Uniformly enhancing atelectatic passive and dependent atelectasis adjacent the basilar effusions though scattered fluid-filled and thickened airways are noted particularly towards the lung bases and some underlying degree of aspiration is not excluded. 3. Low-attenuation, borderline enlarged mediastinal hilar, and abdominopelvic nodes are nonspecific but could be reactive or edematous given features  elsewhere though follow-up imaging may be warranted given the presence of splenomegaly as well. 4. Delayed renal enhancement bilaterally, nonspecific though should correlate for clinical of diminished renal function. 5. Bony fusion across the L1-2 L3 levels may reflect sequela of prior infection or  injury. New finding since 2013. 6. Aortic Atherosclerosis (ICD10-I70.0). 7. Coronary artery calcifications are present. Please note that the presence of coronary artery calcium documents the presence of coronary artery disease, the severity of this disease and any potential stenosis cannot be assessed on this non-gated CT Examination  ECHO 06/18/2020: 1. Right ventricular systolic function is normal. The right ventricular size is severely enlarged. There is moderately elevated pulmonary artery systolic pressure. The estimated right ventricular systolic pressure is 84.6 mmHg. Right ventricular apex not well visualized. Coronary sinus dilation consistent with right ventricular overload. 2. Right atrial size was severely dilated. 3. A small pericardial effusion is present. The pericardial effusion is circumferential. There is no evidence of increased pericardial pressure. 4. Left ventricular ejection fraction, by estimation, is 65 to 70%. The left ventricle has normal function. The left ventricle has no regional wall motion abnormalities. There is mild concentric left ventricular hypertrophy. Left ventricular diastolic parameters were normal. There is the interventricular septum is flattened in systole, consistent with right ventricular pressure overload. 5. Tricuspid valve regurgitation is moderate and eccentric best seen in A4c view . 6. Left atrial size was mildly dilated. 7. The mitral valve is normal in structure. Trivial mitral valve regurgitation. 8. The aortic valve is tricuspid. Aortic valve regurgitation is not visualized. No aortic stenosis is present. 9. The inferior vena cava is dilated in size with <50% respiratory variability, suggesting right atrial pressure of 15 mmHg.   Past Medical History:  Diagnosis Date  . Chronic back pain   . HTN (hypertension)   . IVDU (intravenous drug user)    heroin   . Osteomyelitis ([place])   . Tobacco use     Past Surgical History:   Procedure Laterality Date  . ANTERIOR CERVICAL DECOMP/DISCECTOMY FUSION N/A 12/14/2018   Procedure: ANTERIOR CERVICAL DISCECTOMY FUSION CERVICAL FIVE- CERVICAL SIX;  Surgeon: Eustace Moore, MD;  Location: [facility];  Service: Neurosurgery;  Laterality: N/A;  . BACK SURGERY     states never had surgery  . FRACTURE SURGERY Left    elbow/wrist/ankle    Prior to Admission medications   Not on File    Current Facility-Administered Medications  Medication Dose Route Frequency Provider Last Rate Last Admin  . 0.9 %  sodium chloride infusion (Manually program via Guardrails IV Fluids)   Intravenous Once Rai, Ripudeep K, MD      . arformoterol (BROVANA) nebulizer solution 15 mcg  15 mcg Nebulization BID Rai, Ripudeep K, MD   15 mcg at 06/20/20 2037  . bismuth subsalicylate (PEPTO BISMOL) 262 MG/15ML suspension 30 mL  30 mL Oral Q4H PRN Reubin Milan, MD      . Chlorhexidine Gluconate Cloth 2 % PADS 6 each  6 each Topical Daily Spero Geralds, MD   6 each at 06/21/20 [phone]  . cloNIDine (CATAPRES) tablet 0.1 mg  0.1 mg Oral QID Rai, Ripudeep K, MD   0.1 mg at 06/21/20 0859   Followed by  . [START ON 06/22/2020] cloNIDine (CATAPRES) tablet 0.1 mg  0.1 mg Oral BH-qamhs Rai, Ripudeep K, MD       Followed by  . [START ON 06/24/2020] cloNIDine (CATAPRES) tablet 0.1 mg  0.1 mg Oral QAC breakfast Rai, Ripudeep  K, MD      . dicyclomine (BENTYL) tablet 20 mg  20 mg Oral Q6H PRN Rai, Ripudeep K, MD      . doxycycline (VIBRA-TABS) tablet 100 mg  100 mg Oral Q12H Rai, Ripudeep K, MD   100 mg at 06/21/20 0855  . furosemide (LASIX) injection 40 mg  40 mg Intravenous BID Spero Geralds, MD   40 mg at 06/21/20 0813  . hydrocortisone (ANUSOL-HC) suppository 25 mg  25 mg Rectal BID Rai, Ripudeep K, MD      . HYDROmorphone (DILAUDID) injection 1 mg  1 mg Intravenous Q3H PRN Rai, Ripudeep K, MD   1 mg at 06/21/20 0850  . hydrOXYzine (ATARAX/VISTARIL) tablet 25 mg  25 mg Oral Q6H PRN Rai, Ripudeep K, MD   25 mg at  06/21/20 0452  . ipratropium-albuterol (DUONEB) 0.5-2.5 (3) MG/3ML nebulizer solution 3 mL  3 mL Nebulization QID PRN Rai, Ripudeep K, MD      . lidocaine (LIDODERM) 5 % 1 patch  1 patch Transdermal Q24H Rai, Ripudeep K, MD   1 patch at 06/21/20 0854  . loperamide (IMODIUM) capsule 2-4 mg  2-4 mg Oral PRN Rai, Ripudeep K, MD      . methocarbamol (ROBAXIN) tablet 500 mg  500 mg Oral Q8H PRN Rai, Ripudeep K, MD      . methylPREDNISolone sodium succinate (SOLU-MEDROL) 125 mg/2 mL injection 60 mg  60 mg Intravenous Q8H Rai, Ripudeep K, MD   60 mg at 06/21/20 0603  . naproxen (NAPROSYN) tablet 500 mg  500 mg Oral BID PRN Rai, Ripudeep K, MD      . nicotine (NICODERM CQ - dosed in mg/24 hours) patch 21 mg  21 mg Transdermal Daily Rai, Ripudeep K, MD   21 mg at 06/21/20 1120  . ondansetron (ZOFRAN) injection 4 mg  4 mg Intravenous Q6H PRN Spero Geralds, MD   4 mg at 06/18/20 1715  . ondansetron (ZOFRAN-ODT) disintegrating tablet 4 mg  4 mg Oral Q6H PRN Rai, Ripudeep K, MD   4 mg at 06/20/20 1429  . oxyCODONE (Oxy IR/ROXICODONE) immediate release tablet 10 mg  10 mg Oral Q4H PRN Spero Geralds, MD   10 mg at 06/21/20 1009  . oxyCODONE (OXYCONTIN) 12 hr tablet 10 mg  10 mg Oral Q12H Rai, Ripudeep K, MD   10 mg at 06/21/20 0900  . pantoprazole (PROTONIX) EC tablet 40 mg  40 mg Oral Daily Spero Geralds, MD   40 mg at 06/21/20 0855  . polyethylene glycol (MIRALAX / GLYCOLAX) packet 17 g  17 g Oral Daily PRN Spero Geralds, MD        Allergies as of 06/17/2020 - Review Complete 06/17/2020  Allergen Reaction Noted  . Tylenol [acetaminophen] Hives 02/06/2012    Family History: Father with history of colon cancer.   Social History   Socioeconomic History  . Marital status: Single    Spouse name: Not on file  . Number of children: Not on file  . Years of education: Not on file  . Highest education level: Not on file  Occupational History  . Not on file  Tobacco Use  . Smoking status: Current  Every Day Smoker    Packs/day: 1.00    Types: Cigarettes  . Smokeless tobacco: Never Used  Vaping Use  . Vaping Use: Never used  Substance and Sexual Activity  . Alcohol use: Not Currently  . Drug use: Not Currently  Types: IV, Heroin  . Sexual activity: Not on file  Other Topics Concern  . Not on file  Social History Narrative  . Not on file   Social Determinants of Health   Financial Resource Strain: Not on file  Food Insecurity: Not on file  Transportation Needs: Not on file  Physical Activity: Not on file  Stress: Not on file  Social Connections: Not on file  Intimate Partner Violence: Not on file    Review of Systems: Gen: See HPI. CV: + CP. No palpitations.  Resp: + SOB, nonproductive cough. No hemoptysis.  GI: See HPI. GU : Denies urinary burning or dysuria.  Urinary frequency on diuretics. MS: Back pain.  Derm: Denies rash, itchiness, skin lesions or unhealing ulcers. Psych: + Anxiety.  Heme: + Easy bruising.  Neuro:  Denies headaches, dizziness or paresthesias. Endo:  Denies any problems with DM, thyroid or adrenal function.  Physical Exam: Vital signs in last 24 hours: Temp:  [97.7 F (36.5 C)-98.8 F (37.1 C)] 98.1 F (36.7 C) (04/05 0901) Pulse Rate:  [77-101] 82 (04/05 0901) Resp:  [18-21] 18 (04/05 0901) BP: (115-138)/(76-91) 138/91 (04/05 0901) SpO2:  [88 %-96 %] 88 % (04/05 0901) Weight:  [64.8 kg] 64.8 kg (04/05 0423) Last BM Date: 06/21/20 General: Alert ill appearing 50 year old disheveled male.  Labored breathing. Head:  Normocephalic and atraumatic. Eyes:  No scleral icterus. Conjunctiva pink. Ears:  Normal auditory acuity. Nose:  No deformity, discharge or lesions. Mouth: Extremely poor dentition, several cracked teeth with ongoing decay. Neck:  Supple. No lymphadenopathy or thyromegaly.  Lungs: On 4 L nasal cannula.  Diminished breath sounds throughout, decreased in the bases. Heart: Distant S1, S2.  Regular rhythm.  No  murmur. Abdomen: Distended moderate epigastric and periumbilical tenderness without rebound or guarding, ascites, abdomen is not tense.  No obvious hepatomegaly.  Positive bowel sounds to all 4 quadrants. Rectal: Deferred. Musculoskeletal:  Symmetrical without gross deformities.  Pulses:  Normal pulses noted. Extremities:  Without clubbing or edema.  Neurologic:  Alert and  oriented x 4. No focal deficits.  Upper extremities are tremulous. Skin:  Intact without significant lesions or rashes. Psych:  Alert and cooperative. Normal mood and affect.  Intake/Output from previous day: 04/04 0701 - 04/05 0700 In: 1160 [P.O.:1160] Out: 4750 [Urine:4750] Intake/Output this shift: No intake/output data recorded.  Lab Results: Recent Labs    06/19/20 0735 06/20/20 0645 06/21/20 0416  WBC 11.3* 8.6 5.0  HGB 8.0* 7.9* 6.8*  HCT 32.5* 31.8* 27.3*  PLT 89* 92* 75*   BMET Recent Labs    06/19/20 0108 06/20/20 0645 06/21/20 0416  NA 136 135 135  K 5.1 4.4 3.9  CL 99 94* 90*  CO2 34* 37* 37*  GLUCOSE 143* 70 170*  BUN 44* 38* 38*  CREATININE 1.52* 1.23 1.02  CALCIUM 8.1* 8.1* 8.1*   LFT No results for input(s): PROT, ALBUMIN, AST, ALT, ALKPHOS, BILITOT, BILIDIR, IBILI in the last 72 hours. PT/INR No results for input(s): LABPROT, INR in the last 72 hours. Hepatitis Panel No results for input(s): HEPBSAG, HCVAB, HEPAIGM, HEPBIGM in the last 72 hours.    Studies/Results: DG Chest Port 1 View  Result Date: 06/20/2020 CLINICAL DATA:  50 year old male with chest pain and shortness of breath. Smoker. EXAM: PORTABLE CHEST 1 VIEW COMPARISON:  CT Chest, Abdomen, and Pelvis 06/17/2020 and earlier. FINDINGS: Portable AP semi upright view at 0901 hours. Significantly regressed right pleural effusion. Small residual suspected. Decreased but not resolved bilateral  perihilar interstitial opacity slightly greater on the right. Normal cardiac size and mediastinal contours. Visualized tracheal air  column is within normal limits. No pneumothorax. No areas of worsening ventilation. Paucity of bowel gas in the upper abdomen. No acute osseous abnormality identified. Prior cervical ACDF. IMPRESSION: 1. Regressed but not resolved right pleural effusion and bilateral perihilar opacity, most resembling vascular congestion. 2. No new cardiopulmonary abnormality. Electronically Signed   By: Genevie Ann M.D.   On: 06/20/2020 09:16    IMPRESSION/PLAN:  52.  50 year old male admitted to the hospital with SOB, LE edema and upper abdominal pain.   Laboratory studies identified profound IDA with Hg level of 6.5 (baseline Hg level 12-13). FOBT negative. Transfused 2 units of PRBCs 4/2 -4/3. Hg 7.9 ->Today Hg 6.8. 3rd unit of PRBCs transfused today, post transfusion H/H pending. Iron 20. Ferritin 14. Normal renal function. CTAP 4/1 showed a normal stomach, mildly edematous appearance of the distal small bowel likely due to abdominal ascites and a normal colon. No intra abdominal or pelvic mass.  -Monitor H/H closely, await post transfusion H/H result  -Repeat CBC in am -Transfuse for Hg < 7 -Consider IV iron infusion  -Continue Pantoprazole 40 mg p.o. twice daily -Pain management per the hospitalist  -No plans for endoscopic evaluation until respiratory status stabilized, await further recommendations from Dr. Loletha Carrow   2. Acute respiratory failure on 4L [place]. Chest CT 4/1 showed borderline enlarged mediastinal/hilar lymph nodes, large right and small left pleural effusion.  No concerning pulmonary nodules or masses. On Solumedrol IV, Albuterol and Brovana neb tx. IV antibiotic -> Doxy po.  -Management per the medical team   3.  Acute systolic heart failure. 2D echo showed severely enlarged right ventricular size, moderately elevated pulmonary artery systolic pressure, small pericardial effusion, EF 65 to 70%. On Lasix IV.   4. Thrombocytopenia with Splenomegaly PLT 75. No evidence of cirrhosis per CTAP.   5.  Coagulopathy. INR 1.5 on 4/1.  -Repeat INR in am  6. Abdominal ascites. Albumin 1.9. Urine protein 30.  Normal LFTs, T. Bili and Alk phos levels.  -Diagnostic paracentesis to include cytology, gram stain, cell count with diff, aerobic and aerobic cultures, albumin, protein and cytology to discuss further with Dr. Loletha Carrow prior to ordering  -BMP, hepatic panel in am  7. Active IV drug use (heroin). Last Iv drug use was on 06/17/2020. Past alcohol abuse, abstinent from alcohol since 2004 or 2005.      Noralyn Pick  06/21/2020, 11:43 AM  I have reviewed the entire case in detail with the above APP and discussed the plan in detail.  Therefore, I agree with the diagnoses recorded above. In addition,  I have personally interviewed and examined the patient and have personally reviewed any abdominal/pelvic CT scan images.  My additional thoughts are as follows:  Complex patient with multiple acute and chronic illnesses.  Ascites that is most likely from portal hypertension given the splenomegaly, thrombocytopenia, history of heavy alcohol use and high pulmonary pressure.  Though his LFTs remain normal and his liver does not have an obvious shrunken cirrhotic appearance, I believe he most likely has cirrhosis.  Generalized abdominal pain that is at least partially due to generalized abdominal wall edema/anasarca as well as ascites in the setting of a chronic pain syndrome and opioid abuse. I discontinued his dicyclomine because I think it is more likely to cause side effects than benefit for him in this situation.  Iron deficiency anemia, reportedly heme-negative.  Most likely poor diet, perhaps poor iron absorption, perhaps previous or intermittent occult GI blood loss.  We were asked to consider endoscopic work-up for this, but his pulmonary condition currently precludes safe sedation for nonurgent endoscopic procedures.  Splenomegaly, most likely from portal hypertension.  It is difficult  to be certain of a cirrhosis diagnosis with all of these acute issues going on.  Anasarca from severe protein calorie malnutrition/hypoalbuminemia from poor diet, drug abuse, heart lung and liver disease.  My review of the CT scan shows that he has more abdominal wall edema than free intraperitoneal fluid.  Thus I think he may not have an accessible pocket of ascites to sample for cell count, culture, albumin level and cytology.  My suspicion for malignant ascites in his case is significantly lower than it is for portal hypertensive/transudative ascites.  In addition, he is already received multiple antibiotics, thus any potential SBP may not be discovered on cell count.  His anasarca and severe thrombocytopenia also increase the risks of post procedure ascites leak or bleeding.  Thus, I recommend not doing a diagnostic paracentesis at present. He initially received ceftriaxone and azithromycin, and it was switched to his current doxycycline. I will give him 5 more days of ciprofloxacin 500 mg twice daily in the event that he has SBP.  His most pressing issue is his respiratory distress.  He has prolonged expiratory phase and probable obstructive lung disease which is likely leading to his pulmonary hypertension.  His portal hypertension hypoalbuminemia are clearly leading to a right-sided pleural effusion.  Primary medical team along with pulmonary critical care will most likely consider paracentesis if they feel it is necessary for impaired respiratory status.  I think at this point efforts are being made to cautiously diurese him rather than risk such procedures.  His mother was there during my visit and I reviewed all my thoughts about this.  David Short is currently anxious and perhaps going through substance withdrawal and he is quite focused on sufficient control of his pain.  I done my best to explain the tight rope we are walking managing his multiple issues including that.  In short, I recommend  cautious diuresis, sodium restriction, no paracentesis at present, and no current plans for endoscopic procedures.  We will follow him with you and make further recommendations as his condition evolves.   Nelida Meuse III Office:[phone]

## 2020-06-21 NOTE — Progress Notes (Signed)
RN in to give Dilaudid for pain 10/10. Patient states "I want all I can get as often as I can have it". Patient asking for Dilaudid, Oxycodone, and Oxycontin to all be given at the same time. Explained to patient that pain medications needed to be spaced out and that Dilaudid could be given now and Oxycontin at 2200. Patient became agitated stating that he wants them all "NOW". RN reiterated rationale for spacing out medications, including airway maintenance, overall pain control with spaced out doses, and how frequent meds were ordered. Oxycontin due at 2200. Next dose of Oxycodone not available for administration until 2205. After discussion patient states that he understands.

## 2020-06-22 DIAGNOSIS — D509 Iron deficiency anemia, unspecified: Secondary | ICD-10-CM

## 2020-06-22 DIAGNOSIS — R188 Other ascites: Secondary | ICD-10-CM

## 2020-06-22 LAB — TYPE AND SCREEN
ABO/RH(D): O POS
Antibody Screen: NEGATIVE
Unit division: 0

## 2020-06-22 LAB — CBC
HCT: 31 % — ABNORMAL LOW (ref 39.0–52.0)
Hemoglobin: 8.3 g/dL — ABNORMAL LOW (ref 13.0–17.0)
MCH: 20.5 pg — ABNORMAL LOW (ref 26.0–34.0)
MCHC: 26.8 g/dL — ABNORMAL LOW (ref 30.0–36.0)
MCV: 76.5 fL — ABNORMAL LOW (ref 80.0–100.0)
Platelets: 69 10*3/uL — ABNORMAL LOW (ref 150–400)
RBC: 4.05 MIL/uL — ABNORMAL LOW (ref 4.22–5.81)
RDW: 23.8 % — ABNORMAL HIGH (ref 11.5–15.5)
WBC: 6.4 10*3/uL (ref 4.0–10.5)
nRBC: 0.9 % — ABNORMAL HIGH (ref 0.0–0.2)

## 2020-06-22 LAB — BASIC METABOLIC PANEL
Anion gap: 9 (ref 5–15)
BUN: 46 mg/dL — ABNORMAL HIGH (ref 6–20)
CO2: 40 mmol/L — ABNORMAL HIGH (ref 22–32)
Calcium: 8.4 mg/dL — ABNORMAL LOW (ref 8.9–10.3)
Chloride: 89 mmol/L — ABNORMAL LOW (ref 98–111)
Creatinine, Ser: 0.98 mg/dL (ref 0.61–1.24)
GFR, Estimated: >60 mL/min (ref >60)
Glucose, Bld: 134 mg/dL — ABNORMAL HIGH (ref 70–99)
Potassium: 4 mmol/L (ref 3.5–5.1)
Sodium: 138 mmol/L (ref 135–145)

## 2020-06-22 LAB — BPAM RBC
Blood Product Expiration Date: 202204282359
ISSUE DATE / TIME: 202204050838
Unit Type and Rh: 5100

## 2020-06-22 LAB — PROTIME-INR
INR: 1.5 — ABNORMAL HIGH (ref 0.8–1.2)
Prothrombin Time: 17.9 seconds — ABNORMAL HIGH (ref 11.4–15.2)

## 2020-06-22 MED ORDER — OXYCODONE HCL ER 15 MG PO T12A
15.0000 mg | EXTENDED_RELEASE_TABLET | Freq: Two times a day (BID) | ORAL | Status: DC
  Administered 2020-06-22 – 2020-06-25 (×7): 15 mg via ORAL
  Filled 2020-06-22 (×7): qty 1

## 2020-06-22 NOTE — Progress Notes (Signed)
[facility]                                                                              Patient Demographics  David Short, is a 50 y.o. male, DOB - Jul 17, 1970, WUJ:[id number]  Admit date - 06/17/2020   Admitting Physician Charlotte Sanes, MD  Outpatient Primary MD for the patient is Default, Provider, MD  Outpatient specialists:   LOS - 4  days   Medical records reviewed and are as summarized below:    Chief Complaint  Patient presents with  . Shortness of Breath       Brief summary   Patient is a 50 year old man with a history of vertebral osteomyelitis, anterior cervical discectomy,  cervical spinal fusion, Reported HX of HTN per chart, current smoker, chronic back pain, here with dyspnea for the past several weeks.  Mostly he notices dyspnea on exertion.  No dyspnea on talking.  Worsening dyspnea x 2 weeks. He also endorses orthopnea, but he is not sure of the duration (at least months).  Lower extremity edema to the knees for the past two weeks.  In the ED, patient was treated for COPD exacerbation, as well as volume overload due to CHF.Found to have a R sided pleural effusion, some atelectasis, pulm edema, ascites, anasarca.  Was given methylprednisolone, duonebs, lasix 40mg  IV, ceftriaxone and azithromycin.  Was placed on bipap due to worsening hypoxemia despite NRB, increasing work of breaking.  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 -2D echo showed severely enlarged right ventricular size, moderately elevated pulmonary artery systolic pressure, 52.5 mmHg, small pericardial effusion, EF 65 to  70% -Continue IV Lasix 40 mg every 8 hours, shortness of breath is improving today, much less labored compared to yesterday.  Negative balance of 14 L - will obtain chest x-ray, no significant improvement in right-sided pleural effusion, will attempt thoracentesis in a.m.  Acute COPD exacerbation -CXR on 4/5 showed improved but not resolved right-sided pleural effusion, pulmonary vascular congestion  -Continue scheduled duo nebs, IV Solu-Medrol, doxycycline, Brovana  -Repeat chest x-ray in a.m.  Right-sided pleural effusion - will attempt thoracentesis if pleural effusion not improved, patient has been on increased diuresis with Lasix  Chronic cervical/back pain, -Continues to ask for increasing pain medications.  Difficult situation as patient also IV drug user with heroin, high pain tolerance, has prior back surgeries.   -Palliative medicine was consulted for assistance with pain management. -Increase OxyContin to 15 mg every 12 hours, continue oxycodone and IV Dilaudid for breakthrough pain  Likely IV heroin drug withdrawals -Placed on clonidine withdrawal protocol  Anemia of chronic disease, microcytic -H&H currently stable at 8.3, was transfused 1 unit packed RBCs on 4/3 -Baseline hemoglobin 12-13  Code  Status: Full CODE STATUS DVT Prophylaxis:  SCDs Start: 06/18/20 0051   Level of Care: Level of care: Telemetry Cardiac Family Communication: Discussed all imaging results, lab results, explained to the patient, and sister, Victorino Dike at the bedside   Disposition Plan:     Status is: Inpatient  Remains inpatient appropriate because:Inpatient level of care appropriate due to severity of illness   Dispo: The patient is from: Home              Anticipated d/c is to: Home              Patient currently is not medically stable to d/c.  Currently on IV Lasix for diuresis, diffuse wheezing, IV heroin withdrawals   Difficult to place patient No      Time Spent in minutes 35  minutes  Procedures:  None  Consultants:   Admitted by [facility] Palliative medicine Gastroenterology  Antimicrobials:   Anti-infectives (From admission, onward)   Start     Dose/Rate Route Frequency Ordered Stop   06/21/20 2000  ciprofloxacin (CIPRO) tablet 500 mg        500 mg Oral 2 times daily 06/21/20 1426 06/26/20 1959   06/20/20 1000  doxycycline (VIBRA-TABS) tablet 100 mg        100 mg Oral Every 12 hours 06/20/20 0841     06/17/20 2345  cefTRIAXone (ROCEPHIN) 1 g in sodium chloride 0.9 % 100 mL IVPB        1 g 200 mL/hr over 30 Minutes Intravenous  Once 06/17/20 2341 06/18/20 0119   06/17/20 2345  azithromycin (ZITHROMAX) 500 mg in sodium chloride 0.9 % 250 mL IVPB        500 mg 250 mL/hr over 60 Minutes Intravenous  Once 06/17/20 2341 06/18/20 0614         Medications  Scheduled Meds: . sodium chloride   Intravenous Once  . arformoterol  15 mcg Nebulization BID  . Chlorhexidine Gluconate Cloth  6 each Topical Daily  . ciprofloxacin  500 mg Oral BID  . cloNIDine  0.1 mg Oral BH-qamhs   Followed by  . [START ON 06/24/2020] cloNIDine  0.1 mg Oral QAC breakfast  . doxycycline  100 mg Oral Q12H  . furosemide  40 mg Intravenous Q8H  . hydrocortisone  25 mg Rectal BID  . lidocaine  1 patch Transdermal Q24H  . methylPREDNISolone (SOLU-MEDROL) injection  60 mg Intravenous Q8H  . nicotine  21 mg Transdermal Daily  . oxyCODONE  15 mg Oral Q12H  . pantoprazole  40 mg Oral Daily   Continuous Infusions: PRN Meds:.bismuth subsalicylate, HYDROmorphone (DILAUDID) injection, hydrOXYzine, ipratropium-albuterol, loperamide, methocarbamol, naproxen, ondansetron (ZOFRAN) IV, ondansetron, oxyCODONE, polyethylene glycol      Subjective:   David Short was seen and examined today.  Respiratory status improving today, breathing much less labored.  Still continues to complain of worse pain and asking for pain medications.  No diarrhea.  Patient denies dizziness, chest pain,   abdominal pain, nausea vomiting new weakness, numbess, tingling.  No fevers, on 6 L O2 via [place]  Objective:   Vitals:   06/22/20 0334 06/22/20 0400 06/22/20 0923 06/22/20 1147  BP: (!) 139/92   121/84  Pulse: 77   (!) 107  Resp: 18     Temp: 98 F (36.7 C)   (!) 97.5 F (36.4 C)  TempSrc: Oral   Oral  SpO2: 90% 94% 92% 91%  Weight:      Height:  Intake/Output Summary (Last 24 hours) at 06/22/2020 1437 Last data filed at 06/22/2020 1300 Gross per 24 hour  Intake 1316 ml  Output 5100 ml  Net -3784 ml     Wt Readings from Last 3 Encounters:  06/22/20 60.6 kg  12/30/18 71.2 kg  08/18/16 77.1 kg   Physical Exam  General: Alert and oriented x 3, NAD  Cardiovascular: S1 S2 clear, RRR.  Trace pedal edema b/l  Respiratory: Bilateral wheezing improving today  Gastrointestinal: Soft, nontender, nondistended, NBS  Ext: trace pedal edema bilaterally  Neuro: no new deficits  Musculoskeletal: No cyanosis, clubbing  Skin: No rashes  Psych: anxious     Data Reviewed:  I have personally reviewed following labs and imaging studies  Micro Results Recent Results (from the past 240 hour(s))  Resp Panel by RT-PCR (Flu A&B, Covid) Nasopharyngeal Swab     Status: None   Collection Time: 06/17/20  9:49 PM   Specimen: Nasopharyngeal Swab; Nasopharyngeal(NP) swabs in vial transport medium  Result Value Ref Range Status   SARS Coronavirus 2 by RT PCR NEGATIVE NEGATIVE Final    Comment: (NOTE) SARS-CoV-2 target nucleic acids are NOT DETECTED.  The SARS-CoV-2 RNA is generally detectable in upper respiratory specimens during the acute phase of infection. The lowest concentration of SARS-CoV-2 viral copies this assay can detect is 138 copies/mL. A negative result does not preclude SARS-Cov-2 infection and should not be used as the sole basis for treatment or other patient management decisions. A negative result may occur with  improper specimen collection/handling, submission  of specimen other than nasopharyngeal swab, presence of viral mutation(s) within the areas targeted by this assay, and inadequate number of viral copies(<138 copies/mL). A negative result must be combined with clinical observations, patient history, and epidemiological information. The expected result is Negative.  Fact Sheet for Patients:  BloggerCourse.com  Fact Sheet for Healthcare Providers:  SeriousBroker.it  This test is no t yet approved or cleared by the [place] FDA and  has been authorized for detection and/or diagnosis of SARS-CoV-2 by FDA under an Emergency Use Authorization (EUA). This EUA will remain  in effect (meaning this test can be used) for the duration of the COVID-19 declaration under Section 564(b)(1) of the Act, 21 U.S.C.section 360bbb-3(b)(1), unless the authorization is terminated  or revoked sooner.       Influenza A by PCR NEGATIVE NEGATIVE Final   Influenza B by PCR NEGATIVE NEGATIVE Final    Comment: (NOTE) The Xpert Xpress SARS-CoV-2/FLU/RSV plus assay is intended as an aid in the diagnosis of influenza from Nasopharyngeal swab specimens and should not be used as a sole basis for treatment. Nasal washings and aspirates are unacceptable for Xpert Xpress SARS-CoV-2/FLU/RSV testing.  Fact Sheet for Patients: BloggerCourse.com  Fact Sheet for Healthcare Providers: SeriousBroker.it  This test is not yet approved or cleared by the [place] FDA and has been authorized for detection and/or diagnosis of SARS-CoV-2 by FDA under an Emergency Use Authorization (EUA). This EUA will remain in effect (meaning this test can be used) for the duration of the COVID-19 declaration under Section 564(b)(1) of the Act, 21 U.S.C. section 360bbb-3(b)(1), unless the authorization is terminated or revoked.  Performed at [facility], [address],  [place], [place] [address]     Radiology Reports CT Head Wo Contrast  Result Date: 06/17/2020 CLINICAL DATA:  Abnormal mental status, head trauma EXAM: CT HEAD WITHOUT CONTRAST TECHNIQUE: Contiguous axial images were obtained from the base of the  skull through the vertex without intravenous contrast. COMPARISON:  MRI 12/13/2018 FINDINGS: Brain: No acute intracranial abnormality. Specifically, no hemorrhage, hydrocephalus, mass lesion, acute infarction, or significant intracranial injury. Vascular: No hyperdense vessel or unexpected calcification. Skull: No acute calvarial abnormality. Sinuses/Orbits: No acute findings. Mucosal thickening in the paranasal sinuses. Mastoid air cells clear. Other: None IMPRESSION: No acute intracranial abnormality. Electronically Signed   By: Charlett NoseKevin  Dover M.D.   On: 06/17/2020 22:48   CT Chest W Contrast  Result Date: 06/17/2020 CLINICAL DATA:  Shortness of breath for 2 weeks with altered mental status, swelling and edema in the upper and lower extremities, 50% on room air EXAM: CT CHEST, ABDOMEN, AND PELVIS WITH CONTRAST TECHNIQUE: Multidetector CT imaging of the chest, abdomen and pelvis was performed following the standard protocol during bolus administration of intravenous contrast. CONTRAST:  100mL OMNIPAQUE IOHEXOL 300 MG/ML  SOLN COMPARISON:  Radiograph 06/17/2020 renal ultrasound 03/17/2012, CT 03/01/2012 FINDINGS: CT CHEST FINDINGS Cardiovascular: Borderline cardiomegaly with trace pericardial effusion. Extensive three-vessel coronary artery atherosclerosis. The aortic root is suboptimally assessed given cardiac pulsation artifact. Atherosclerotic plaque within the normal caliber aorta. No acute luminal abnormality of the imaged aorta. No periaortic stranding or hemorrhage. Normal 3 vessel branching of the aortic arch. Proximal great vessels are mildly calcified but otherwise unremarkable. Central pulmonary arteries are enlarged. No large central filling defects are  evident within the limitations of this non tailored examination of the pulmonary arteries. Mediastinum/Nodes: Low-attenuation, borderline enlarged mediastinal and hilar lymph nodes are present including a 12 mm paratracheal lymph node (4/23), and 11 mm AP window lymph node (4/21) and a 12 mm left hilar lymph node (6/111). No acute abnormality of the trachea or esophagus. Thyroid gland and thoracic inlet are unremarkable. Lungs/Pleura: Large right and small left pleural effusion with areas of fairly uniformly enhancing atelectatic passive and dependent atelectasis though scattered fluid-filled and thickened airways are noted particularly towards the lung bases and some underlying degree of aspiration is not excluded. No pneumothorax. No other focal airspace opacity. No concerning pulmonary nodules or masses. Musculoskeletal: No acute osseous abnormality or suspicious osseous lesion. CT ABDOMEN PELVIS FINDINGS Hepatobiliary: No concerning focal liver lesion. Diffuse periportal edema. Smooth liver surface contour. No significant gallbladder wall thickening. Pericholecystic fluid is likely related to redistributed ascites throughout the abdomen. Pancreas: No pancreatic ductal dilatation or surrounding inflammatory changes. Spleen: Slightly heterogeneous enhancement of the spleen may be related to contrast timing. Splenomegaly is present. Measuring up to 18 cm in craniocaudal dimension. Normal splenic cleft. No concerning splenic lesion. Adrenals/Urinary Tract: Normal adrenal glands. Symmetrically delayed renal enhancement and excretion. No visible focal renal lesion. No urolithiasis or hydronephrosis. Urinary bladder is unremarkable for degree of distension. Stomach/Bowel: Stomach and duodenum unremarkable. Slightly edematous appearance of the distal small bowel may be related to presence of moderate volume ascites and presumed volume third-spacing. No colonic dilatation or wall thickening. No evidence of bowel  obstruction. The appendix is not well visualized. Vascular/Lymphatic: Atherosclerotic calcifications within the abdominal aorta and branch vessels. No aneurysm or ectasia. No enlarged abdominopelvic lymph nodes. Edematous appearing mesenteric nodes as well as some borderline enlarged though low-attenuation bilateral inguinal nodes. No other conspicuous enlarged abdominopelvic nodes. Reproductive: Coarse eccentric calcification of the prostate. No concerning abnormalities of the prostate or seminal vesicles. A markedly edematous changes of the soft tissues of the included internal genitalia. Other: Moderate volume ascites with additional edematous changes throughout the mesentery. Extensive circumferential body wall edema noted as well. Tiny fat containing umbilical hernia.  No bowel containing hernias. Musculoskeletal: Bony fusion across the L1-2 L3 levels may reflect sequela of prior infection or injury. Given that this is a new finding since 2013. No other acute or conspicuous osseous abnormalities. Bones of the pelvis appear intact and congruent. IMPRESSION: 1. Extensive features of anasarca with circumferential body wall edema, pleural and pericardial effusions, periportal edema, moderate volume ascites and edematous changes of the mesentery. 2. Uniformly enhancing atelectatic passive and dependent atelectasis adjacent the basilar effusions though scattered fluid-filled and thickened airways are noted particularly towards the lung bases and some underlying degree of aspiration is not excluded. 3. Low-attenuation, borderline enlarged mediastinal hilar, and abdominopelvic nodes are nonspecific but could be reactive or edematous given features elsewhere though follow-up imaging may be warranted given the presence of splenomegaly as well. 4. Delayed renal enhancement bilaterally, nonspecific though should correlate for clinical of diminished renal function. 5. Bony fusion across the L1-2 L3 levels may reflect sequela  of prior infection or injury. New finding since 2013. 6. Aortic Atherosclerosis (ICD10-I70.0). 7. Coronary artery calcifications are present. Please note that the presence of coronary artery calcium documents the presence of coronary artery disease, the severity of this disease and any potential stenosis cannot be assessed on this non-gated CT examination. Electronically Signed   By: Kreg Shropshire M.D.   On: 06/17/2020 23:07   CT ABDOMEN PELVIS W CONTRAST  Result Date: 06/17/2020 CLINICAL DATA:  Shortness of breath for 2 weeks with altered mental status, swelling and edema in the upper and lower extremities, 50% on room air EXAM: CT CHEST, ABDOMEN, AND PELVIS WITH CONTRAST TECHNIQUE: Multidetector CT imaging of the chest, abdomen and pelvis was performed following the standard protocol during bolus administration of intravenous contrast. CONTRAST:  OMNIPAQUE IOHEXOL 300 MG/ML  SOLN COMPARISON:  Radiograph 06/17/2020 renal ultrasound 03/17/2012, CT 03/01/2012 FINDINGS: CT CHEST FINDINGS Cardiovascular: Borderline cardiomegaly with trace pericardial effusion. Extensive three-vessel coronary artery atherosclerosis. The aortic root is suboptimally assessed given cardiac pulsation artifact. Atherosclerotic plaque within the normal caliber aorta. No acute luminal abnormality of the imaged aorta. No periaortic stranding or hemorrhage. Normal 3 vessel branching of the aortic arch. Proximal great vessels are mildly calcified but otherwise unremarkable. Central pulmonary arteries are enlarged. No large central filling defects are evident within the limitations of this non tailored examination of the pulmonary arteries. Mediastinum/Nodes: Low-attenuation, borderline enlarged mediastinal and hilar lymph nodes are present including a 12 mm paratracheal lymph node (4/23), and 11 mm AP window lymph node (4/21) and a 12 mm left hilar lymph node (6/111). No acute abnormality of the trachea or esophagus. Thyroid gland and  thoracic inlet are unremarkable. Lungs/Pleura: Large right and small left pleural effusion with areas of fairly uniformly enhancing atelectatic passive and dependent atelectasis though scattered fluid-filled and thickened airways are noted particularly towards the lung bases and some underlying degree of aspiration is not excluded. No pneumothorax. No other focal airspace opacity. No concerning pulmonary nodules or masses. Musculoskeletal: No acute osseous abnormality or suspicious osseous lesion. CT ABDOMEN PELVIS FINDINGS Hepatobiliary: No concerning focal liver lesion. Diffuse periportal edema. Smooth liver surface contour. No significant gallbladder wall thickening. Pericholecystic fluid is likely related to redistributed ascites throughout the abdomen. Pancreas: No pancreatic ductal dilatation or surrounding inflammatory changes. Spleen: Slightly heterogeneous enhancement of the spleen may be related to contrast timing. Splenomegaly is present. Measuring up to 18 cm in craniocaudal dimension. Normal splenic cleft. No concerning splenic lesion. Adrenals/Urinary Tract: Normal adrenal glands. Symmetrically delayed renal enhancement and  excretion. No visible focal renal lesion. No urolithiasis or hydronephrosis. Urinary bladder is unremarkable for degree of distension. Stomach/Bowel: Stomach and duodenum unremarkable. Slightly edematous appearance of the distal small bowel may be related to presence of moderate volume ascites and presumed volume third-spacing. No colonic dilatation or wall thickening. No evidence of bowel obstruction. The appendix is not well visualized. Vascular/Lymphatic: Atherosclerotic calcifications within the abdominal aorta and branch vessels. No aneurysm or ectasia. No enlarged abdominopelvic lymph nodes. Edematous appearing mesenteric nodes as well as some borderline enlarged though low-attenuation bilateral inguinal nodes. No other conspicuous enlarged abdominopelvic nodes. Reproductive:  Coarse eccentric calcification of the prostate. No concerning abnormalities of the prostate or seminal vesicles. A markedly edematous changes of the soft tissues of the included internal genitalia. Other: Moderate volume ascites with additional edematous changes throughout the mesentery. Extensive circumferential body wall edema noted as well. Tiny fat containing umbilical hernia. No bowel containing hernias. Musculoskeletal: Bony fusion across the L1-2 L3 levels may reflect sequela of prior infection or injury. Given that this is a new finding since 2013. No other acute or conspicuous osseous abnormalities. Bones of the pelvis appear intact and congruent. IMPRESSION: 1. Extensive features of anasarca with circumferential body wall edema, pleural and pericardial effusions, periportal edema, moderate volume ascites and edematous changes of the mesentery. 2. Uniformly enhancing atelectatic passive and dependent atelectasis adjacent the basilar effusions though scattered fluid-filled and thickened airways are noted particularly towards the lung bases and some underlying degree of aspiration is not excluded. 3. Low-attenuation, borderline enlarged mediastinal hilar, and abdominopelvic nodes are nonspecific but could be reactive or edematous given features elsewhere though follow-up imaging may be warranted given the presence of splenomegaly as well. 4. Delayed renal enhancement bilaterally, nonspecific though should correlate for clinical of diminished renal function. 5. Bony fusion across the L1-2 L3 levels may reflect sequela of prior infection or injury. New finding since 2013. 6. Aortic Atherosclerosis (ICD10-I70.0). 7. Coronary artery calcifications are present. Please note that the presence of coronary artery calcium documents the presence of coronary artery disease, the severity of this disease and any potential stenosis cannot be assessed on this non-gated CT examination. Electronically Signed   By: Kreg Shropshire  M.D.   On: 06/17/2020 23:07   DG Chest Port 1 View  Result Date: 06/21/2020 CLINICAL DATA:  Shortness of breath. EXAM: PORTABLE CHEST 1 VIEW COMPARISON:  Chest x-ray 06/20/2020.  Chest CT 06/17/2020. FINDINGS: Heart size upper normal. Vascular congestion again noted with diffuse interstitial opacity. Hazy opacity at the right base compatible with known pleural effusion seen on previous CT. The visualized bony structures of the thorax show no acute abnormality. Telemetry leads overlie the chest. IMPRESSION: No substantial change since chest x-ray from yesterday. Electronically Signed   By: Kennith Center M.D.   On: 06/21/2020 13:30   DG Chest Port 1 View  Result Date: 06/20/2020 CLINICAL DATA:  50 year old male with chest pain and shortness of breath. Smoker. EXAM: PORTABLE CHEST 1 VIEW COMPARISON:  CT Chest, Abdomen, and Pelvis 06/17/2020 and earlier. FINDINGS: Portable AP semi upright view at 0901 hours. Significantly regressed right pleural effusion. Small residual suspected. Decreased but not resolved bilateral perihilar interstitial opacity slightly greater on the right. Normal cardiac size and mediastinal contours. Visualized tracheal air column is within normal limits. No pneumothorax. No areas of worsening ventilation. Paucity of bowel gas in the upper abdomen. No acute osseous abnormality identified. Prior cervical ACDF. IMPRESSION: 1. Regressed but not resolved right pleural effusion and bilateral  perihilar opacity, most resembling vascular congestion. 2. No new cardiopulmonary abnormality. Electronically Signed   By: Odessa Fleming M.D.   On: 06/20/2020 09:16   DG Chest Portable 1 View  Result Date: 06/17/2020 CLINICAL DATA:  Shortness of breath EXAM: PORTABLE CHEST 1 VIEW COMPARISON:  12/12/2018 FINDINGS: Shallow inspiration. Small to moderate right pleural effusion with consolidation in the right lung base. Linear atelectasis or infiltration in the left base. Cardiac enlargement. No vascular congestion.  Calcification of the aorta. IMPRESSION: Small to moderate right pleural effusion with consolidation in the right lung base. Linear atelectasis or infiltration in the left base. Electronically Signed   By: Burman Nieves M.D.   On: 06/17/2020 20:18   ECHOCARDIOGRAM COMPLETE  Result Date: 06/18/2020    ECHOCARDIOGRAM REPORT   Patient Name:   Liliana Cline Date of Exam: 06/18/2020 Medical Rec #:  [id number]         Height:       72.0 in Accession #:    [id number]        Weight:       166.0 lb Date of Birth:  22-May-1970         BSA:          1.968 m Patient Age:    49 years          BP:           115/71 mmHg Patient Gender: M                 HR:           101 bpm. Exam Location:  Inpatient Procedure: 2D Echo, Color Doppler and Cardiac Doppler Indications:    I51.7 Cardiomegaly; Acute Respiratory Distress R06.03  History:        Patient has no prior history of Echocardiogram examinations.  Sonographer:    Tiffany Dance Referring Phys: [id number] Charlotte Sanes IMPRESSIONS  1. Right ventricular systolic function is normal. The right ventricular size is severely enlarged. There is moderately elevated pulmonary artery systolic pressure. The estimated right ventricular systolic pressure is 52.5 mmHg. Right ventricular apex not well visualized. Coronary sinus dilation consistent with right ventricular overload.  2. Right atrial size was severely dilated.  3. A small pericardial effusion is present. The pericardial effusion is circumferential. There is no evidence of increased pericardial pressure.  4. Left ventricular ejection fraction, by estimation, is 65 to 70%. The left ventricle has normal function. The left ventricle has no regional wall motion abnormalities. There is mild concentric left ventricular hypertrophy. Left ventricular diastolic parameters were normal. There is the interventricular septum is flattened in systole, consistent with right ventricular pressure overload.  5. Tricuspid valve regurgitation is  moderate and eccentric best seen in A4c view .  6. Left atrial size was mildly dilated.  7. The mitral valve is normal in structure. Trivial mitral valve regurgitation.  8. The aortic valve is tricuspid. Aortic valve regurgitation is not visualized. No aortic stenosis is present.  9. The inferior vena cava is dilated in size with <50% respiratory variability, suggesting right atrial pressure of 15 mmHg. Comparison(s): No prior Echocardiogram. FINDINGS  Left Ventricle: Left ventricular ejection fraction, by estimation, is 65 to 70%. The left ventricle has normal function. The left ventricle has no regional wall motion abnormalities. The left ventricular internal cavity size was normal in size. There is  mild concentric left ventricular hypertrophy. The interventricular septum is flattened in systole, consistent with right ventricular pressure overload. Left ventricular  diastolic parameters were normal. Right Ventricle: The right ventricular size is severely enlarged. No increase in right ventricular wall thickness. Right ventricular systolic function is normal. There is moderately elevated pulmonary artery systolic pressure. The tricuspid regurgitant velocity is 3.06 m/s, and with an assumed right atrial pressure of 15 mmHg, the estimated right ventricular systolic pressure is 52.5 mmHg. Left Atrium: Left atrial size was mildly dilated. Right Atrium: Right atrial size was severely dilated. Pericardium: A small pericardial effusion is present. The pericardial effusion is circumferential. There is no evidence of cardiac tamponade. Mitral Valve: The mitral valve is normal in structure. Trivial mitral valve regurgitation. Tricuspid Valve: The tricuspid valve is normal in structure. Tricuspid valve regurgitation is moderate . No evidence of tricuspid stenosis. Aortic Valve: The aortic valve is tricuspid. Aortic valve regurgitation is not visualized. No aortic stenosis is present. Pulmonic Valve: Discrepancy between  Spectral Doppler and Color Doppler. The pulmonic valve was grossly normal. Pulmonic valve regurgitation is not visualized. No evidence of pulmonic stenosis. Aorta: The aortic root and ascending aorta are structurally normal, with no evidence of dilitation. Venous: The inferior vena cava is dilated in size with less than 50% respiratory variability, suggesting right atrial pressure of 15 mmHg. IAS/Shunts: The atrial septum is grossly normal.  LEFT VENTRICLE PLAX 2D LVIDd:         4.80 cm  Diastology LVIDs:         2.30 cm  LV e' medial:    11.00 cm/s LV PW:         1.30 cm  LV E/e' medial:  8.5 LV IVS:        1.10 cm  LV e' lateral:   10.30 cm/s LVOT diam:     2.00 cm  LV E/e' lateral: 9.0 LV SV:         75 LV SV Index:   38 LVOT Area:     3.14 cm  RIGHT VENTRICLE             IVC RV Basal diam:  3.80 cm     IVC diam: 3.30 cm RV Mid diam:    2.80 cm RV S prime:     16.50 cm/s TAPSE (M-mode): 1.9 cm LEFT ATRIUM              Index       RIGHT ATRIUM           Index LA diam:        5.60 cm  2.84 cm/m  RA Area:     28.50 cm LA Vol (A2C):   122.0 ml 61.98 ml/m RA Volume:   111.00 ml 56.39 ml/m LA Vol (A4C):   46.7 ml  23.73 ml/m LA Biplane Vol: 75.8 ml  38.51 ml/m  AORTIC VALVE LVOT Vmax:   123.00 cm/s LVOT Vmean:  82.400 cm/s LVOT VTI:    0.239 m  AORTA Ao Root diam: 3.60 cm Ao Asc diam:  3.30 cm MITRAL VALVE               TRICUSPID VALVE MV Area (PHT): 3.48 cm    TR Peak grad:   37.5 mmHg MV Decel Time: 218 msec    TR Vmax:        306.00 cm/s MV E velocity: 93.00 cm/s MV A velocity: 88.10 cm/s  SHUNTS MV E/A ratio:  1.06        Systemic VTI:  0.24 m  Systemic Diam: 2.00 cm Riley Lam MD Electronically signed by Riley Lam MD Signature Date/Time: 06/18/2020/4:36:14 PM    Final     Lab Data:  CBC: Recent Labs  Lab 06/17/20 1948 06/17/20 2006 06/18/20 1610 06/19/20 0108 06/19/20 9604 06/20/20 0645 06/21/20 0416 06/21/20 1323 06/22/20 0407  WBC 9.2  --  8.2  10.9* 11.3* 8.6 5.0  --  6.4  NEUTROABS 6.7  --  7.2  --   --   --   --   --   --   HGB 6.4*   < > 6.9* 6.5* 8.0* 7.9* 6.8* 8.1* 8.3*  HCT 29.4*   < > 29.4* 27.2* 32.5* 31.8* 27.3* 31.8* 31.0*  MCV 76.8*  --  76.4* 76.2* 76.8* 78.3* 76.0*  --  76.5*  PLT 127*  --  101* 102* 89* 92* 75*  --  69*   < > = values in this interval not displayed.   Basic Metabolic Panel: Recent Labs  Lab 06/18/20 0940 06/19/20 0108 06/20/20 0645 06/21/20 0416 06/22/20 0407  NA 131* 136 135 135 138  K 5.6* 5.1 4.4 3.9 4.0  CL 100 99 94* 90* 89*  CO2 22 34* 37* 37* 40*  GLUCOSE 223* 143* 70 170* 134*  BUN 41* 44* 38* 38* 46*  CREATININE 1.61* 1.52* 1.23 1.02 0.98  CALCIUM 7.9* 8.1* 8.1* 8.1* 8.4*   GFR: Estimated Creatinine Clearance: 77.3 mL/min (by C-G formula based on SCr of 0.98 mg/dL). Liver Function Tests: Recent Labs  Lab 06/17/20 1948 06/18/20 0940  AST 34 27  ALT 15 14  ALKPHOS 105 87  BILITOT 1.4* 1.0  PROT 8.2* 7.4  ALBUMIN 2.2* 1.9*   No results for input(s): LIPASE, AMYLASE in the last 168 hours. Recent Labs  Lab 06/17/20 1958  AMMONIA 52*   Coagulation Profile: Recent Labs  Lab 06/17/20 2101 06/22/20 0407  INR 1.5* 1.5*   Cardiac Enzymes: No results for input(s): CKTOTAL, CKMB, CKMBINDEX, TROPONINI in the last 168 hours. BNP (last 3 results) No results for input(s): PROBNP in the last 8760 hours. HbA1C: No results for input(s): HGBA1C in the last 72 hours. CBG: No results for input(s): GLUCAP in the last 168 hours. Lipid Profile: No results for input(s): CHOL, HDL, LDLCALC, TRIG, CHOLHDL, LDLDIRECT in the last 72 hours. Thyroid Function Tests: No results for input(s): TSH, T4TOTAL, FREET4, T3FREE, THYROIDAB in the last 72 hours. Anemia Panel: Recent Labs    06/20/20 1438  VITAMINB12 633  FOLATE 6.4  FERRITIN 14*  TIBC 440  IRON 20*  RETICCTPCT 1.7   Urine analysis:    Component Value Date/Time   COLORURINE YELLOW 06/17/2020 2340   APPEARANCEUR HAZY  (A) 06/17/2020 2340   LABSPEC 1.010 06/17/2020 2340   PHURINE 5.0 06/17/2020 2340   GLUCOSEU NEGATIVE 06/17/2020 2340   HGBUR LARGE (A) 06/17/2020 2340   BILIRUBINUR NEGATIVE 06/17/2020 2340   KETONESUR NEGATIVE 06/17/2020 2340   PROTEINUR 30 (A) 06/17/2020 2340   UROBILINOGEN 1.0 03/17/2012 0047   NITRITE NEGATIVE 06/17/2020 2340   LEUKOCYTESUR NEGATIVE 06/17/2020 2340     Gianella Chismar M.D. [facility] 06/22/2020, 2:37 PM  Available via Epic secure chat 7am-7pm After 7 pm, please refer to night coverage provider listed on amion.

## 2020-06-22 NOTE — Progress Notes (Signed)
[facility] Progress Note  CC:  Anemia, FOBT negative   Subjective: He appears to have less labored breathing today.  He is on oxygen 6 L nasal cannula.  No chest pain.  His RN reported he has intermittent panic attacks.  He is tolerating a solid diet.  He reported passing a brown bowel movement earlier this morning.  No rectal bleeding or melena.  He complains of generalized abdominal pain which is worse to the left lower quadrant.    Objective:  Vital signs in last 24 hours: Temp:  [97.5 F (36.4 C)-98.1 F (36.7 C)] 97.5 F (36.4 C) (04/06 1147) Pulse Rate:  [77-107] 107 (04/06 1147) Resp:  [18] 18 (04/06 0334) BP: (121-139)/(84-92) 121/84 (04/06 1147) SpO2:  [90 %-94 %] 91 % (04/06 1147) Weight:  [60.6 kg] 60.6 kg (04/06 0333) Last BM Date: 06/21/20 General: Chronically ill-appearing 50 year old male in no acute distress. Heart: Tachycardic, no murmur. Pulm: Breath sounds clear, decreased at bases bilaterally.  No wheezes or rhonchi. Abdomen: Generalized tenderness with light tactile pressure without rebound or guarding.  Anasarca component, likely has a small amount of ascites as well.  No mass.  Positive bowel sounds to all 4 quadrants. Extremities:  No lower extremity edema. Neurologic:  Alert and  oriented x 4.  Unable to perform asterixis exam as he stated his arms were too weak to hold upright.  Speech is clear. Skin: No jaundice. Scattered small ulcerations  to his extremities Psych:  Alert and cooperative. Normal mood and affect.  Intake/Output from previous day: 04/05 0701 - 04/06 0700 In: 1468 [P.O.:1076; Blood:392] Out: 5053 [Urine:5300] Intake/Output this shift: No intake/output data recorded.  Lab Results: Recent Labs    06/20/20 0645 06/21/20 0416 06/21/20 1323 06/22/20 0407  WBC 8.6 5.0  --  6.4  HGB 7.9* 6.8* 8.1* 8.3*  HCT 31.8* 27.3* 31.8* 31.0*  PLT 92* 75*  --  69*   BMET Recent Labs    06/20/20 0645 06/21/20 0416  06/22/20 0407  NA 135 135 138  K 4.4 3.9 4.0  CL 94* 90* 89*  CO2 37* 37* 40*  GLUCOSE 70 170* 134*  BUN 38* 38* 46*  CREATININE 1.23 1.02 0.98  CALCIUM 8.1* 8.1* 8.4*   LFT No results for input(s): PROT, ALBUMIN, AST, ALT, ALKPHOS, BILITOT, BILIDIR, IBILI in the last 72 hours. PT/INR Recent Labs    06/22/20 0407  LABPROT 17.9*  INR 1.5*   Hepatitis Panel No results for input(s): HEPBSAG, HCVAB, HEPAIGM, HEPBIGM in the last 72 hours.  DG Chest Port 1 View  Result Date: 06/21/2020 CLINICAL DATA:  Shortness of breath. EXAM: PORTABLE CHEST 1 VIEW COMPARISON:  Chest x-ray 06/20/2020.  Chest CT 06/17/2020. FINDINGS: Heart size upper normal. Vascular congestion again noted with diffuse interstitial opacity. Hazy opacity at the right base compatible with known pleural effusion seen on previous CT. The visualized bony structures of the thorax show no acute abnormality. Telemetry leads overlie the chest. IMPRESSION: No substantial change since chest x-ray from yesterday. Electronically Signed   By: Misty Stanley M.D.   On: 06/21/2020 13:30    Assessment / Plan:  22.  50 year old male admitted to the hospital with SOB, LE edema and upper abdominal pain.   Laboratory studies identified profound IDA with Hg level of 6.5 (baseline Hg level 12-13). FOBT negative. Transfused 2 units of PRBCs 4/2 -4/3. Hg 7.9 -> Hg 6.8. 3rd unit of PRBCs transfused 4/5 -> Hg 8.1 -> Today Hg  8.3. Iron 20. Ferritin 14. Normal renal function. CTAP 4/1 showed a normal stomach, mildly edematous appearance of the distal small bowel likely due to abdominal ascites and a normal colon. No intra abdominal or pelvic mass. Unlikely enough ascites for a diagnostic paracentesis.  He was placed on Cipro 500 mg p.o. twice daily 4/5 for SBP prophylaxis. WBC 6.4. He remains afebrile.  -Repeat CBC in am -Transfuse for Hg < 7 -Continue Pantoprazole 40 mg p.o. twice daily -Pain management per the hospitalist  -No plans for endoscopic  evaluation until respiratory status stabilized -Consider repeat CTAP with worsening abdominal pain, await further recommendations per Dr. Tarri Glenn  2. Acute respiratory failure on 4L [place]. Chest CT 4/1 showed borderline enlarged mediastinal/hilar lymph nodes, large right and small left pleural effusion.  No concerning pulmonary nodules or masses. On Solumedrol IV, Albuterol and Brovana neb tx. IV antibiotic -> Doxy po.  -Consider right thoracentesis if right pleural effusion worsens, defer to the medical team   3.  Acute systolic heart failure. 2D echo showed severely enlarged right ventricular size, moderately elevated pulmonary artery systolic pressure, small pericardial effusion, EF 65 to 70%. On Lasix IV.   4. Thrombocytopenia with Splenomegaly PLT 69. CTAP showed a normal liver, however, cirrhosis suspected.   5. Coagulopathy. INR 1.5.  -Repeat INR in am  6. Abdominal ascites/anasarca secondary to pulmonary HTN, TR, suspected cirrhosis and hypoalbuminemia.  Albumin 1.9. Urine protein 30.  Normal LFTs, T. Bili and Alk phos levels. Unlikely enough ascites for paracentesis.    7. Active IV drug use (Heroin an Fentanyl). Last Iv drug use was on 06/17/2020. Past alcohol abuse, abstinent from alcohol since 2004 or 2005.     Active Problems:   CHF (congestive heart failure) ([place])     LOS: 4 days   Noralyn Pick  06/22/2020, 12:45 PM

## 2020-06-22 NOTE — Plan of Care (Signed)
  Problem: Education: Goal: Knowledge of General Education information will improve Description Including pain rating scale, medication(s)/side effects and non-pharmacologic comfort measures Outcome: Progressing   Problem: Health Behavior/Discharge Planning: Goal: Ability to manage health-related needs will improve Outcome: Progressing   

## 2020-06-23 DIAGNOSIS — I5021 Acute systolic (congestive) heart failure: Secondary | ICD-10-CM

## 2020-06-23 LAB — CBC
HCT: 36.6 % — ABNORMAL LOW (ref 39.0–52.0)
Hemoglobin: 9.6 g/dL — ABNORMAL LOW (ref 13.0–17.0)
MCH: 20 pg — ABNORMAL LOW (ref 26.0–34.0)
MCHC: 26.2 g/dL — ABNORMAL LOW (ref 30.0–36.0)
MCV: 76.4 fL — ABNORMAL LOW (ref 80.0–100.0)
Platelets: 74 10*3/uL — ABNORMAL LOW (ref 150–400)
RBC: 4.79 MIL/uL (ref 4.22–5.81)
RDW: 24 % — ABNORMAL HIGH (ref 11.5–15.5)
WBC: 9 10*3/uL (ref 4.0–10.5)
nRBC: 0.4 % — ABNORMAL HIGH (ref 0.0–0.2)

## 2020-06-23 LAB — BASIC METABOLIC PANEL
Anion gap: 9 (ref 5–15)
BUN: 59 mg/dL — ABNORMAL HIGH (ref 6–20)
CO2: 39 mmol/L — ABNORMAL HIGH (ref 22–32)
Calcium: 8.1 mg/dL — ABNORMAL LOW (ref 8.9–10.3)
Chloride: 88 mmol/L — ABNORMAL LOW (ref 98–111)
Creatinine, Ser: 1.1 mg/dL (ref 0.61–1.24)
GFR, Estimated: >60 mL/min (ref >60)
Glucose, Bld: 215 mg/dL — ABNORMAL HIGH (ref 70–99)
Potassium: 3.6 mmol/L (ref 3.5–5.1)
Sodium: 136 mmol/L (ref 135–145)

## 2020-06-23 LAB — PROTIME-INR
INR: 1.6 — ABNORMAL HIGH (ref 0.8–1.2)
Prothrombin Time: 18.5 seconds — ABNORMAL HIGH (ref 11.4–15.2)

## 2020-06-23 LAB — HEMOGLOBIN A1C
Hgb A1c MFr Bld: 5.6 % (ref 4.8–5.6)
Mean Plasma Glucose: 114.02 mg/dL

## 2020-06-23 MED ORDER — METHYLPREDNISOLONE SODIUM SUCC 40 MG IJ SOLR
40.0000 mg | Freq: Three times a day (TID) | INTRAMUSCULAR | Status: DC
  Administered 2020-06-23 – 2020-06-24 (×3): 40 mg via INTRAVENOUS
  Filled 2020-06-23 (×3): qty 1

## 2020-06-23 NOTE — Progress Notes (Signed)
[facility]                                                                              Patient Demographics  David Short, is a 50 y.o. male, DOB - 08-13-1970, ZOX:[id number]  Admit date - 06/17/2020   Admitting Physician Charlotte Sanes, MD  Outpatient Primary MD for the patient is Default, Provider, MD  Outpatient specialists:   LOS - 5  days   Medical records reviewed and are as summarized below:    Chief Complaint  Patient presents with  . Shortness of Breath       Brief summary   Patient is a 50 year old man with a history of vertebral osteomyelitis, anterior cervical discectomy,  cervical spinal fusion, Reported HX of HTN per chart, current smoker, chronic back pain, here with dyspnea for the past several weeks.  Mostly he notices dyspnea on exertion.  No dyspnea on talking.  Worsening dyspnea x 2 weeks. He also endorses orthopnea, but he is not sure of the duration (at least months).  Lower extremity edema to the knees for the past two weeks.  In the ED, patient was treated for COPD exacerbation, as well as volume overload due to CHF.Found to have a R sided pleural effusion, some atelectasis, pulm edema, ascites, anasarca.  Was given methylprednisolone, duonebs, lasix  IV, ceftriaxone and azithromycin.  Was placed on bipap due to worsening hypoxemia despite NRB, increasing work of breaking.  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 -2D echo showed severely enlarged right ventricular size, moderately elevated pulmonary artery systolic pressure, 52.5 mmHg, small pericardial effusion, EF 65 to  70% -Continue IV Lasix, negative balance of 15 L.  Shortness of breath is improving.   -Chest x-ray, BNP in a.m.  Acute COPD exacerbation -CXR on 4/5 showed improved but not resolved right-sided pleural effusion, pulmonary vascular congestion  -Continue scheduled duo nebs,, doxycycline, Brovana  -Chest x-ray in a.m., taper IV Solu-Medrol  Right-sided pleural effusion - will attempt thoracentesis if pleural effusion not improved, patient has been on increased diuresis with Lasix -Chest x-ray in a.m.  Chronic cervical/back pain, -Continues to ask for increasing pain medications.  Difficult situation as patient also IV drug user with heroin, high pain tolerance, has prior back surgeries.   -Palliative medicine was consulted for assistance with pain management. -Continue current pain management, no further escalation -Increase OxyContin to 15 mg every 12 hours, continue oxycodone and IV Dilaudid for breakthrough pain  Likely IV heroin drug withdrawals -Placed on clonidine withdrawal protocol, currently stable  Anemia of chronic disease, microcytic -H&H currently stable at 8.3, was transfused 1 unit packed RBCs on 4/3 -Baseline hemoglobin 12-13  Code Status: Full CODE STATUS DVT Prophylaxis:  SCDs Start:  06/18/20 0051   Level of Care: Level of care: Telemetry Cardiac Family Communication: Discussed all imaging results, lab results, explained to the patient,   Disposition Plan:     Status is: Inpatient  Remains inpatient appropriate because:Inpatient level of care appropriate due to severity of illness   Dispo: The patient is from: Home              Anticipated d/c is to: Home              Patient currently is not medically stable to d/c.  Currently on IV Lasix for diuresis, diffuse wheezing, IV heroin withdrawals   Difficult to place patient No      Time Spent in minutes 35 minutes  Procedures:  None  Consultants:   Admitted by [facility] Palliative  medicine Gastroenterology  Antimicrobials:   Anti-infectives (From admission, onward)   Start     Dose/Rate Route Frequency Ordered Stop   06/21/20 2000  ciprofloxacin (CIPRO) tablet 500 mg        500 mg Oral 2 times daily 06/21/20 1426 06/26/20 1959   06/20/20 1000  doxycycline (VIBRA-TABS) tablet 100 mg        100 mg Oral Every 12 hours 06/20/20 0841     06/17/20 2345  cefTRIAXone (ROCEPHIN) 1 g in sodium chloride 0.9 % 100 mL IVPB        1 g 200 mL/hr over 30 Minutes Intravenous  Once 06/17/20 2341 06/18/20 0119   06/17/20 2345  azithromycin (ZITHROMAX) 500 mg in sodium chloride 0.9 % 250 mL IVPB        500 mg 250 mL/hr over 60 Minutes Intravenous  Once 06/17/20 2341 06/18/20 0614         Medications  Scheduled Meds: . sodium chloride   Intravenous Once  . arformoterol  15 mcg Nebulization BID  . Chlorhexidine Gluconate Cloth  6 each Topical Daily  . ciprofloxacin  500 mg Oral BID  . cloNIDine  0.1 mg Oral BH-qamhs   Followed by  . [START ON 06/24/2020] cloNIDine  0.1 mg Oral QAC breakfast  . doxycycline  100 mg Oral Q12H  . furosemide  40 mg Intravenous Q8H  . hydrocortisone  25 mg Rectal BID  . lidocaine  1 patch Transdermal Q24H  . methylPREDNISolone (SOLU-MEDROL) injection  40 mg Intravenous Q8H  . nicotine  21 mg Transdermal Daily  . oxyCODONE  15 mg Oral Q12H  . pantoprazole  40 mg Oral Daily   Continuous Infusions: PRN Meds:.bismuth subsalicylate, HYDROmorphone (DILAUDID) injection, hydrOXYzine, ipratropium-albuterol, loperamide, methocarbamol, naproxen, ondansetron (ZOFRAN) IV, ondansetron, oxyCODONE, polyethylene glycol      Subjective:   Annette Liotta was seen and examined today.  Overall improving, breathing much less labored now.  Still complaining of pain however appears to be comfortable than yesterday.  Patient denies dizziness, chest pain,  abdominal pain, nausea vomiting new weakness, numbess, tingling.  No fevers, on 6 L O2 via [place] Objective:    Vitals:   06/23/20 0346 06/23/20 0828 06/23/20 0901 06/23/20 1124  BP:  114/78  108/67  Pulse:  98 87 (!) 109  Resp:  18 16 16   Temp:  98 F (36.7 C)  98.3 F (36.8 C)  TempSrc:  Oral  Oral  SpO2:  95% 96% 93%  Weight: 60.6 kg     Height:        Intake/Output Summary (Last 24 hours) at 06/23/2020 1519 Last data filed at 06/23/2020 1300 Gross per 24 hour  Intake 840 ml  Output 2400 ml  Net -1560 ml     Wt Readings from Last 3 Encounters:  06/23/20 60.6 kg  12/30/18 71.2 kg  08/18/16 77.1 kg   Physical Exam  General: Alert and oriented x 3, NAD  Cardiovascular: S1 S2 clear, RRR.  Trace pedal edema b/l  Respiratory: Diminished breath sound at the bases however much more clear than yesterday  Gastrointestinal: Soft, nontender, nondistended, NBS  Ext: trace pedal edema bilaterally  Neuro: no new deficits  Musculoskeletal: No cyanosis, clubbing  Skin: No rashes  Psych: flat affect    Data Reviewed:  I have personally reviewed following labs and imaging studies  Micro Results Recent Results (from the past 240 hour(s))  Resp Panel by RT-PCR (Flu A&B, Covid) Nasopharyngeal Swab     Status: None   Collection Time: 06/17/20  9:49 PM   Specimen: Nasopharyngeal Swab; Nasopharyngeal(NP) swabs in vial transport medium  Result Value Ref Range Status   SARS Coronavirus 2 by RT PCR NEGATIVE NEGATIVE Final    Comment: (NOTE) SARS-CoV-2 target nucleic acids are NOT DETECTED.  The SARS-CoV-2 RNA is generally detectable in upper respiratory specimens during the acute phase of infection. The lowest concentration of SARS-CoV-2 viral copies this assay can detect is 138 copies/mL. A negative result does not preclude SARS-Cov-2 infection and should not be used as the sole basis for treatment or other patient management decisions. A negative result may occur with  improper specimen collection/handling, submission of specimen other than nasopharyngeal swab, presence of viral  mutation(s) within the areas targeted by this assay, and inadequate number of viral copies(<138 copies/mL). A negative result must be combined with clinical observations, patient history, and epidemiological information. The expected result is Negative.  Fact Sheet for Patients:  BloggerCourse.com  Fact Sheet for Healthcare Providers:  SeriousBroker.it  This test is no t yet approved or cleared by the [place] FDA and  has been authorized for detection and/or diagnosis of SARS-CoV-2 by FDA under an Emergency Use Authorization (EUA). This EUA will remain  in effect (meaning this test can be used) for the duration of the COVID-19 declaration under Section 564(b)(1) of the Act, 21 U.S.C.section 360bbb-3(b)(1), unless the authorization is terminated  or revoked sooner.       Influenza A by PCR NEGATIVE NEGATIVE Final   Influenza B by PCR NEGATIVE NEGATIVE Final    Comment: (NOTE) The Xpert Xpress SARS-CoV-2/FLU/RSV plus assay is intended as an aid in the diagnosis of influenza from Nasopharyngeal swab specimens and should not be used as a sole basis for treatment. Nasal washings and aspirates are unacceptable for Xpert Xpress SARS-CoV-2/FLU/RSV testing.  Fact Sheet for Patients: BloggerCourse.com  Fact Sheet for Healthcare Providers: SeriousBroker.it  This test is not yet approved or cleared by the [place] FDA and has been authorized for detection and/or diagnosis of SARS-CoV-2 by FDA under an Emergency Use Authorization (EUA). This EUA will remain in effect (meaning this test can be used) for the duration of the COVID-19 declaration under Section 564(b)(1) of the Act, 21 U.S.C. section 360bbb-3(b)(1), unless the authorization is terminated or revoked.  Performed at [facility], [address] [place], [place] [address]     Radiology Reports CT Head Wo  Contrast  Result Date: 06/17/2020 CLINICAL DATA:  Abnormal mental status, head trauma EXAM: CT HEAD WITHOUT CONTRAST TECHNIQUE: Contiguous axial images were obtained from the base of the skull through the vertex without intravenous contrast. COMPARISON:  MRI 12/13/2018  FINDINGS: Brain: No acute intracranial abnormality. Specifically, no hemorrhage, hydrocephalus, mass lesion, acute infarction, or significant intracranial injury. Vascular: No hyperdense vessel or unexpected calcification. Skull: No acute calvarial abnormality. Sinuses/Orbits: No acute findings. Mucosal thickening in the paranasal sinuses. Mastoid air cells clear. Other: None IMPRESSION: No acute intracranial abnormality. Electronically Signed   By: Charlett Nose M.D.   On: 06/17/2020 22:48   CT Chest W Contrast  Result Date: 06/17/2020 CLINICAL DATA:  Shortness of breath for 2 weeks with altered mental status, swelling and edema in the upper and lower extremities, 50% on room air EXAM: CT CHEST, ABDOMEN, AND PELVIS WITH CONTRAST TECHNIQUE: Multidetector CT imaging of the chest, abdomen and pelvis was performed following the standard protocol during bolus administration of intravenous contrast. CONTRAST:  OMNIPAQUE IOHEXOL 300 MG/ML  SOLN COMPARISON:  Radiograph 06/17/2020 renal ultrasound 03/17/2012, CT 03/01/2012 FINDINGS: CT CHEST FINDINGS Cardiovascular: Borderline cardiomegaly with trace pericardial effusion. Extensive three-vessel coronary artery atherosclerosis. The aortic root is suboptimally assessed given cardiac pulsation artifact. Atherosclerotic plaque within the normal caliber aorta. No acute luminal abnormality of the imaged aorta. No periaortic stranding or hemorrhage. Normal 3 vessel branching of the aortic arch. Proximal great vessels are mildly calcified but otherwise unremarkable. Central pulmonary arteries are enlarged. No large central filling defects are evident within the limitations of this non tailored examination of  the pulmonary arteries. Mediastinum/Nodes: Low-attenuation, borderline enlarged mediastinal and hilar lymph nodes are present including a 12 mm paratracheal lymph node (4/23), and 11 mm AP window lymph node (4/21) and a 12 mm left hilar lymph node (6/111). No acute abnormality of the trachea or esophagus. Thyroid gland and thoracic inlet are unremarkable. Lungs/Pleura: Large right and small left pleural effusion with areas of fairly uniformly enhancing atelectatic passive and dependent atelectasis though scattered fluid-filled and thickened airways are noted particularly towards the lung bases and some underlying degree of aspiration is not excluded. No pneumothorax. No other focal airspace opacity. No concerning pulmonary nodules or masses. Musculoskeletal: No acute osseous abnormality or suspicious osseous lesion. CT ABDOMEN PELVIS FINDINGS Hepatobiliary: No concerning focal liver lesion. Diffuse periportal edema. Smooth liver surface contour. No significant gallbladder wall thickening. Pericholecystic fluid is likely related to redistributed ascites throughout the abdomen. Pancreas: No pancreatic ductal dilatation or surrounding inflammatory changes. Spleen: Slightly heterogeneous enhancement of the spleen may be related to contrast timing. Splenomegaly is present. Measuring up to 18 cm in craniocaudal dimension. Normal splenic cleft. No concerning splenic lesion. Adrenals/Urinary Tract: Normal adrenal glands. Symmetrically delayed renal enhancement and excretion. No visible focal renal lesion. No urolithiasis or hydronephrosis. Urinary bladder is unremarkable for degree of distension. Stomach/Bowel: Stomach and duodenum unremarkable. Slightly edematous appearance of the distal small bowel may be related to presence of moderate volume ascites and presumed volume third-spacing. No colonic dilatation or wall thickening. No evidence of bowel obstruction. The appendix is not well visualized. Vascular/Lymphatic:  Atherosclerotic calcifications within the abdominal aorta and branch vessels. No aneurysm or ectasia. No enlarged abdominopelvic lymph nodes. Edematous appearing mesenteric nodes as well as some borderline enlarged though low-attenuation bilateral inguinal nodes. No other conspicuous enlarged abdominopelvic nodes. Reproductive: Coarse eccentric calcification of the prostate. No concerning abnormalities of the prostate or seminal vesicles. A markedly edematous changes of the soft tissues of the included internal genitalia. Other: Moderate volume ascites with additional edematous changes throughout the mesentery. Extensive circumferential body wall edema noted as well. Tiny fat containing umbilical hernia. No bowel containing hernias. Musculoskeletal: Bony fusion across the L1-2 L3  levels may reflect sequela of prior infection or injury. Given that this is a new finding since 2013. No other acute or conspicuous osseous abnormalities. Bones of the pelvis appear intact and congruent. IMPRESSION: 1. Extensive features of anasarca with circumferential body wall edema, pleural and pericardial effusions, periportal edema, moderate volume ascites and edematous changes of the mesentery. 2. Uniformly enhancing atelectatic passive and dependent atelectasis adjacent the basilar effusions though scattered fluid-filled and thickened airways are noted particularly towards the lung bases and some underlying degree of aspiration is not excluded. 3. Low-attenuation, borderline enlarged mediastinal hilar, and abdominopelvic nodes are nonspecific but could be reactive or edematous given features elsewhere though follow-up imaging may be warranted given the presence of splenomegaly as well. 4. Delayed renal enhancement bilaterally, nonspecific though should correlate for clinical of diminished renal function. 5. Bony fusion across the L1-2 L3 levels may reflect sequela of prior infection or injury. New finding since 2013. 6. Aortic  Atherosclerosis (ICD10-I70.0). 7. Coronary artery calcifications are present. Please note that the presence of coronary artery calcium documents the presence of coronary artery disease, the severity of this disease and any potential stenosis cannot be assessed on this non-gated CT examination. Electronically Signed   By: Kreg Shropshire M.D.   On: 06/17/2020 23:07   CT ABDOMEN PELVIS W CONTRAST  Result Date: 06/17/2020 CLINICAL DATA:  Shortness of breath for 2 weeks with altered mental status, swelling and edema in the upper and lower extremities, 50% on room air EXAM: CT CHEST, ABDOMEN, AND PELVIS WITH CONTRAST TECHNIQUE: Multidetector CT imaging of the chest, abdomen and pelvis was performed following the standard protocol during bolus administration of intravenous contrast. CONTRAST:  OMNIPAQUE IOHEXOL 300 MG/ML  SOLN COMPARISON:  Radiograph 06/17/2020 renal ultrasound 03/17/2012, CT 03/01/2012 FINDINGS: CT CHEST FINDINGS Cardiovascular: Borderline cardiomegaly with trace pericardial effusion. Extensive three-vessel coronary artery atherosclerosis. The aortic root is suboptimally assessed given cardiac pulsation artifact. Atherosclerotic plaque within the normal caliber aorta. No acute luminal abnormality of the imaged aorta. No periaortic stranding or hemorrhage. Normal 3 vessel branching of the aortic arch. Proximal great vessels are mildly calcified but otherwise unremarkable. Central pulmonary arteries are enlarged. No large central filling defects are evident within the limitations of this non tailored examination of the pulmonary arteries. Mediastinum/Nodes: Low-attenuation, borderline enlarged mediastinal and hilar lymph nodes are present including a 12 mm paratracheal lymph node (4/23), and 11 mm AP window lymph node (4/21) and a 12 mm left hilar lymph node (6/111). No acute abnormality of the trachea or esophagus. Thyroid gland and thoracic inlet are unremarkable. Lungs/Pleura: Large right and  small left pleural effusion with areas of fairly uniformly enhancing atelectatic passive and dependent atelectasis though scattered fluid-filled and thickened airways are noted particularly towards the lung bases and some underlying degree of aspiration is not excluded. No pneumothorax. No other focal airspace opacity. No concerning pulmonary nodules or masses. Musculoskeletal: No acute osseous abnormality or suspicious osseous lesion. CT ABDOMEN PELVIS FINDINGS Hepatobiliary: No concerning focal liver lesion. Diffuse periportal edema. Smooth liver surface contour. No significant gallbladder wall thickening. Pericholecystic fluid is likely related to redistributed ascites throughout the abdomen. Pancreas: No pancreatic ductal dilatation or surrounding inflammatory changes. Spleen: Slightly heterogeneous enhancement of the spleen may be related to contrast timing. Splenomegaly is present. Measuring up to 18 cm in craniocaudal dimension. Normal splenic cleft. No concerning splenic lesion. Adrenals/Urinary Tract: Normal adrenal glands. Symmetrically delayed renal enhancement and excretion. No visible focal renal lesion. No urolithiasis or hydronephrosis. Urinary  bladder is unremarkable for degree of distension. Stomach/Bowel: Stomach and duodenum unremarkable. Slightly edematous appearance of the distal small bowel may be related to presence of moderate volume ascites and presumed volume third-spacing. No colonic dilatation or wall thickening. No evidence of bowel obstruction. The appendix is not well visualized. Vascular/Lymphatic: Atherosclerotic calcifications within the abdominal aorta and branch vessels. No aneurysm or ectasia. No enlarged abdominopelvic lymph nodes. Edematous appearing mesenteric nodes as well as some borderline enlarged though low-attenuation bilateral inguinal nodes. No other conspicuous enlarged abdominopelvic nodes. Reproductive: Coarse eccentric calcification of the prostate. No concerning  abnormalities of the prostate or seminal vesicles. A markedly edematous changes of the soft tissues of the included internal genitalia. Other: Moderate volume ascites with additional edematous changes throughout the mesentery. Extensive circumferential body wall edema noted as well. Tiny fat containing umbilical hernia. No bowel containing hernias. Musculoskeletal: Bony fusion across the L1-2 L3 levels may reflect sequela of prior infection or injury. Given that this is a new finding since 2013. No other acute or conspicuous osseous abnormalities. Bones of the pelvis appear intact and congruent. IMPRESSION: 1. Extensive features of anasarca with circumferential body wall edema, pleural and pericardial effusions, periportal edema, moderate volume ascites and edematous changes of the mesentery. 2. Uniformly enhancing atelectatic passive and dependent atelectasis adjacent the basilar effusions though scattered fluid-filled and thickened airways are noted particularly towards the lung bases and some underlying degree of aspiration is not excluded. 3. Low-attenuation, borderline enlarged mediastinal hilar, and abdominopelvic nodes are nonspecific but could be reactive or edematous given features elsewhere though follow-up imaging may be warranted given the presence of splenomegaly as well. 4. Delayed renal enhancement bilaterally, nonspecific though should correlate for clinical of diminished renal function. 5. Bony fusion across the L1-2 L3 levels may reflect sequela of prior infection or injury. New finding since 2013. 6. Aortic Atherosclerosis (ICD10-I70.0). 7. Coronary artery calcifications are present. Please note that the presence of coronary artery calcium documents the presence of coronary artery disease, the severity of this disease and any potential stenosis cannot be assessed on this non-gated CT examination. Electronically Signed   By: Kreg Shropshire M.D.   On: 06/17/2020 23:07   DG Chest Port 1  View  Result Date: 06/21/2020 CLINICAL DATA:  Shortness of breath. EXAM: PORTABLE CHEST 1 VIEW COMPARISON:  Chest x-ray 06/20/2020.  Chest CT 06/17/2020. FINDINGS: Heart size upper normal. Vascular congestion again noted with diffuse interstitial opacity. Hazy opacity at the right base compatible with known pleural effusion seen on previous CT. The visualized bony structures of the thorax show no acute abnormality. Telemetry leads overlie the chest. IMPRESSION: No substantial change since chest x-ray from yesterday. Electronically Signed   By: Kennith Center M.D.   On: 06/21/2020 13:30   DG Chest Port 1 View  Result Date: 06/20/2020 CLINICAL DATA:  50 year old male with chest pain and shortness of breath. Smoker. EXAM: PORTABLE CHEST 1 VIEW COMPARISON:  CT Chest, Abdomen, and Pelvis 06/17/2020 and earlier. FINDINGS: Portable AP semi upright view at 0901 hours. Significantly regressed right pleural effusion. Small residual suspected. Decreased but not resolved bilateral perihilar interstitial opacity slightly greater on the right. Normal cardiac size and mediastinal contours. Visualized tracheal air column is within normal limits. No pneumothorax. No areas of worsening ventilation. Paucity of bowel gas in the upper abdomen. No acute osseous abnormality identified. Prior cervical ACDF. IMPRESSION: 1. Regressed but not resolved right pleural effusion and bilateral perihilar opacity, most resembling vascular congestion. 2. No new cardiopulmonary abnormality.  Electronically Signed   By: Odessa FlemingH  Hall M.D.   On: 06/20/2020 09:16   DG Chest Portable 1 View  Result Date: 06/17/2020 CLINICAL DATA:  Shortness of breath EXAM: PORTABLE CHEST 1 VIEW COMPARISON:  12/12/2018 FINDINGS: Shallow inspiration. Small to moderate right pleural effusion with consolidation in the right lung base. Linear atelectasis or infiltration in the left base. Cardiac enlargement. No vascular congestion. Calcification of the aorta. IMPRESSION: Small  to moderate right pleural effusion with consolidation in the right lung base. Linear atelectasis or infiltration in the left base. Electronically Signed   By: Burman NievesWilliam  Stevens M.D.   On: 06/17/2020 20:18   ECHOCARDIOGRAM COMPLETE  Result Date: 06/18/2020    ECHOCARDIOGRAM REPORT   Patient Name:   Liliana ClineIMOTHY A Stallbaumer Date of Exam: 06/18/2020 Medical Rec #:  [id number]         Height:       72.0 in Accession #:    [id number]        Weight:       166.0 lb Date of Birth:  May 29, 1970         BSA:          1.968 m Patient Age:    49 years          BP:           115/71 mmHg Patient Gender: M                 HR:           101 bpm. Exam Location:  Inpatient Procedure: 2D Echo, Color Doppler and Cardiac Doppler Indications:    I51.7 Cardiomegaly; Acute Respiratory Distress R06.03  History:        Patient has no prior history of Echocardiogram examinations.  Sonographer:    Tiffany Dance Referring Phys: [id number] Charlotte SanesNICOLE GONZALES IMPRESSIONS  1. Right ventricular systolic function is normal. The right ventricular size is severely enlarged. There is moderately elevated pulmonary artery systolic pressure. The estimated right ventricular systolic pressure is 52.5 mmHg. Right ventricular apex not well visualized. Coronary sinus dilation consistent with right ventricular overload.  2. Right atrial size was severely dilated.  3. A small pericardial effusion is present. The pericardial effusion is circumferential. There is no evidence of increased pericardial pressure.  4. Left ventricular ejection fraction, by estimation, is 65 to 70%. The left ventricle has normal function. The left ventricle has no regional wall motion abnormalities. There is mild concentric left ventricular hypertrophy. Left ventricular diastolic parameters were normal. There is the interventricular septum is flattened in systole, consistent with right ventricular pressure overload.  5. Tricuspid valve regurgitation is moderate and eccentric best seen in A4c view .   6. Left atrial size was mildly dilated.  7. The mitral valve is normal in structure. Trivial mitral valve regurgitation.  8. The aortic valve is tricuspid. Aortic valve regurgitation is not visualized. No aortic stenosis is present.  9. The inferior vena cava is dilated in size with <50% respiratory variability, suggesting right atrial pressure of 15 mmHg. Comparison(s): No prior Echocardiogram. FINDINGS  Left Ventricle: Left ventricular ejection fraction, by estimation, is 65 to 70%. The left ventricle has normal function. The left ventricle has no regional wall motion abnormalities. The left ventricular internal cavity size was normal in size. There is  mild concentric left ventricular hypertrophy. The interventricular septum is flattened in systole, consistent with right ventricular pressure overload. Left ventricular diastolic parameters were normal. Right Ventricle: The right ventricular size is  severely enlarged. No increase in right ventricular wall thickness. Right ventricular systolic function is normal. There is moderately elevated pulmonary artery systolic pressure. The tricuspid regurgitant velocity is 3.06 m/s, and with an assumed right atrial pressure of 15 mmHg, the estimated right ventricular systolic pressure is 52.5 mmHg. Left Atrium: Left atrial size was mildly dilated. Right Atrium: Right atrial size was severely dilated. Pericardium: A small pericardial effusion is present. The pericardial effusion is circumferential. There is no evidence of cardiac tamponade. Mitral Valve: The mitral valve is normal in structure. Trivial mitral valve regurgitation. Tricuspid Valve: The tricuspid valve is normal in structure. Tricuspid valve regurgitation is moderate . No evidence of tricuspid stenosis. Aortic Valve: The aortic valve is tricuspid. Aortic valve regurgitation is not visualized. No aortic stenosis is present. Pulmonic Valve: Discrepancy between Spectral Doppler and Color Doppler. The pulmonic valve  was grossly normal. Pulmonic valve regurgitation is not visualized. No evidence of pulmonic stenosis. Aorta: The aortic root and ascending aorta are structurally normal, with no evidence of dilitation. Venous: The inferior vena cava is dilated in size with less than 50% respiratory variability, suggesting right atrial pressure of 15 mmHg. IAS/Shunts: The atrial septum is grossly normal.  LEFT VENTRICLE PLAX 2D LVIDd:         4.80 cm  Diastology LVIDs:         2.30 cm  LV e' medial:    11.00 cm/s LV PW:         1.30 cm  LV E/e' medial:  8.5 LV IVS:        1.10 cm  LV e' lateral:   10.30 cm/s LVOT diam:     2.00 cm  LV E/e' lateral: 9.0 LV SV:         75 LV SV Index:   38 LVOT Area:     3.14 cm  RIGHT VENTRICLE             IVC RV Basal diam:  3.80 cm     IVC diam: 3.30 cm RV Mid diam:    2.80 cm RV S prime:     16.50 cm/s TAPSE (M-mode): 1.9 cm LEFT ATRIUM              Index       RIGHT ATRIUM           Index LA diam:        5.60 cm  2.84 cm/m  RA Area:     28.50 cm LA Vol (A2C):   122.0 ml 61.98 ml/m RA Volume:   111.00 ml 56.39 ml/m LA Vol (A4C):   46.7 ml  23.73 ml/m LA Biplane Vol: 75.8 ml  38.51 ml/m  AORTIC VALVE LVOT Vmax:   123.00 cm/s LVOT Vmean:  82.400 cm/s LVOT VTI:    0.239 m  AORTA Ao Root diam: 3.60 cm Ao Asc diam:  3.30 cm MITRAL VALVE               TRICUSPID VALVE MV Area (PHT): 3.48 cm    TR Peak grad:   37.5 mmHg MV Decel Time: 218 msec    TR Vmax:        306.00 cm/s MV E velocity: 93.00 cm/s MV A velocity: 88.10 cm/s  SHUNTS MV E/A ratio:  1.06        Systemic VTI:  0.24 m  Systemic Diam: 2.00 cm Riley Lam MD Electronically signed by Riley Lam MD Signature Date/Time: 06/18/2020/4:36:14 PM    Final     Lab Data:  CBC: Recent Labs  Lab 06/17/20 1948 06/17/20 2006 06/18/20 1610 06/19/20 0108 06/19/20 9604 06/20/20 0645 06/21/20 0416 06/21/20 1323 06/22/20 0407 06/23/20 0438  WBC 9.2  --  8.2   < > 11.3* 8.6 5.0  --  6.4 9.0   NEUTROABS 6.7  --  7.2  --   --   --   --   --   --   --   HGB 6.4*   < > 6.9*   < > 8.0* 7.9* 6.8* 8.1* 8.3* 9.6*  HCT 29.4*   < > 29.4*   < > 32.5* 31.8* 27.3* 31.8* 31.0* 36.6*  MCV 76.8*  --  76.4*   < > 76.8* 78.3* 76.0*  --  76.5* 76.4*  PLT 127*  --  101*   < > 89* 92* 75*  --  69* 74*   < > = values in this interval not displayed.   Basic Metabolic Panel: Recent Labs  Lab 06/19/20 0108 06/20/20 0645 06/21/20 0416 06/22/20 0407 06/23/20 0438  NA 136 135 135 138 136  K 5.1 4.4 3.9 4.0 3.6  CL 99 94* 90* 89* 88*  CO2 34* 37* 37* 40* 39*  GLUCOSE 143* 70 170* 134* 215*  BUN 44* 38* 38* 46* 59*  CREATININE 1.52* 1.23 1.02 0.98 1.10  CALCIUM 8.1* 8.1* 8.1* 8.4* 8.1*   GFR: Estimated Creatinine Clearance: 68.9 mL/min (by C-G formula based on SCr of 1.1 mg/dL). Liver Function Tests: Recent Labs  Lab 06/17/20 1948 06/18/20 0940  AST 34 27  ALT 15 14  ALKPHOS 105 87  BILITOT 1.4* 1.0  PROT 8.2* 7.4  ALBUMIN 2.2* 1.9*   No results for input(s): LIPASE, AMYLASE in the last 168 hours. Recent Labs  Lab 06/17/20 1958  AMMONIA 52*   Coagulation Profile: Recent Labs  Lab 06/17/20 2101 06/22/20 0407 06/23/20 0438  INR 1.5* 1.5* 1.6*   Cardiac Enzymes: No results for input(s): CKTOTAL, CKMB, CKMBINDEX, TROPONINI in the last 168 hours. BNP (last 3 results) No results for input(s): PROBNP in the last 8760 hours. HbA1C: Recent Labs    06/23/20 0627  HGBA1C 5.6   CBG: No results for input(s): GLUCAP in the last 168 hours. Lipid Profile: No results for input(s): CHOL, HDL, LDLCALC, TRIG, CHOLHDL, LDLDIRECT in the last 72 hours. Thyroid Function Tests: No results for input(s): TSH, T4TOTAL, FREET4, T3FREE, THYROIDAB in the last 72 hours. Anemia Panel: No results for input(s): VITAMINB12, FOLATE, FERRITIN, TIBC, IRON, RETICCTPCT in the last 72 hours. Urine analysis:    Component Value Date/Time   COLORURINE YELLOW 06/17/2020 2340   APPEARANCEUR HAZY (A)  06/17/2020 2340   LABSPEC 1.010 06/17/2020 2340   PHURINE 5.0 06/17/2020 2340   GLUCOSEU NEGATIVE 06/17/2020 2340   HGBUR LARGE (A) 06/17/2020 2340   BILIRUBINUR NEGATIVE 06/17/2020 2340   KETONESUR NEGATIVE 06/17/2020 2340   PROTEINUR 30 (A) 06/17/2020 2340   UROBILINOGEN 1.0 03/17/2012 0047   NITRITE NEGATIVE 06/17/2020 2340   LEUKOCYTESUR NEGATIVE 06/17/2020 2340     Linah Klapper M.D. [facility] 06/23/2020, 3:19 PM  Available via Epic secure chat 7am-7pm After 7 pm, please refer to night coverage provider listed on amion.

## 2020-06-23 NOTE — Plan of Care (Signed)

## 2020-06-23 NOTE — Progress Notes (Signed)
This chaplain responded to PMT consult for spiritual care.  The chaplain checked in with the Pt. RN-Geraldine before the visit. The chaplain met the Pt. mother-[name] outside the room through the RN.  Remo Lipps introduces the Pt. to the chaplain.  The Pt. declined a visit until after lunch.  Remo Lipps accepted the chaplain's invitation for companionship and reflective listening in the hospital Atrium area. Remo Lipps gave the chaplain permission to chart the visit. The chaplain understands Remo Lipps is asking for help and willing to work with the Case Management, Financial Services, and Palliative Care.   Remo Lipps recognizes she can give the Pt. choices, but the decisions are ultimately the Pt.  The Pt. lives with [name]'s sister.  Remo Lipps lives in [place] and will return home on Sunday.  The Pt. leans on his sister, Jennifer's experience for medical decision making.  From the conversation with Remo Lipps, the chaplain understands the Pt. education is limited and he has no source of income.  A positive community environment with positive influences is necessary for the Pt. recovery. The Pt. has been evaluated for disability.  A Medicaid application has not been started.  Remo Lipps is requesting F/U.   Remo Lipps shares the Pt. is a new grandfather of triplets.  Remo Lipps is hopeful the Pt. new role will provide the Pt. hope for change.    This chaplain is available for F/U spiritual care as needed.

## 2020-06-23 NOTE — Progress Notes (Incomplete)
Heart Failure Nurse Navigator Progress Note  PCP: Default, Provider, MD PCP-Cardiologist: *** Admission Diagnosis: *** Admitted from: ***  Presentation:   Caid A Beavers presented with ***  ECHO/ LVEF: ***  Clinical Course:  Past Medical History:  Diagnosis Date  . Chronic back pain   . HTN (hypertension)   . IVDU (intravenous drug user)    heroin   . Osteomyelitis (HCC)   . Tobacco use      Social History   Socioeconomic History  . Marital status: Single    Spouse name: Not on file  . Number of children: Not on file  . Years of education: Not on file  . Highest education level: Not on file  Occupational History  . Not on file  Tobacco Use  . Smoking status: Current Every Day Smoker    Packs/day: 1.00    Types: Cigarettes  . Smokeless tobacco: Never Used  Vaping Use  . Vaping Use: Never used  Substance and Sexual Activity  . Alcohol use: Not Currently  . Drug use: Not Currently    Types: IV, Heroin  . Sexual activity: Not on file  Other Topics Concern  . Not on file  Social History Narrative  . Not on file   Social Determinants of Health   Financial Resource Strain: Not on file  Food Insecurity: Not on file  Transportation Needs: Not on file  Physical Activity: Not on file  Stress: Not on file  Social Connections: Not on file    High Risk Criteria for Readmission and/or Poor Patient Outcomes:  Heart failure hospital admissions (last 6 months): ***   No Show rate: ***  Difficult social situation: ***  Demonstrates medication adherence: ***  Primary Language: ***  Literacy level: ***  Barriers of Care:   ***  Considerations/Referrals:   Referral made to Heart Failure Pharmacist Stewardship: *** Referral made to Heart & Vascular TOC clinic: ***  Items for Follow-up on DC/TOC: ***

## 2020-06-23 NOTE — Progress Notes (Signed)
This chaplain is present for F/U spiritual care with the Pt.  The Pt. mother-[name] is bedside during the visit.  The chaplain notices David Short offers a calming presence for Pt.    The chaplain practices reflective listening with the Pt. as David Short connects the Pt. desire for assistance with Disability, Medicaid, and Financial Services.  The Pt. agrees with [name]'s statements and speaks of his need for support to change his lifestyle. The chaplain understands the Pt. personal goals are to manage his pain without street drugs, take care of himself, and invite his mom to lunch.   The Pt and David Short are curious about the timing of assistance, recognizing David Short will be leaving this area on Sunday.  This chaplain is available for F/U spiritual care as needed.

## 2020-06-24 ENCOUNTER — Inpatient Hospital Stay (HOSPITAL_COMMUNITY): Payer: Medicaid Other

## 2020-06-24 DIAGNOSIS — K7460 Unspecified cirrhosis of liver: Secondary | ICD-10-CM

## 2020-06-24 DIAGNOSIS — D500 Iron deficiency anemia secondary to blood loss (chronic): Secondary | ICD-10-CM

## 2020-06-24 DIAGNOSIS — I5020 Unspecified systolic (congestive) heart failure: Secondary | ICD-10-CM

## 2020-06-24 LAB — BASIC METABOLIC PANEL
Anion gap: 10 (ref 5–15)
BUN: 63 mg/dL — ABNORMAL HIGH (ref 6–20)
CO2: 38 mmol/L — ABNORMAL HIGH (ref 22–32)
Calcium: 8.2 mg/dL — ABNORMAL LOW (ref 8.9–10.3)
Chloride: 88 mmol/L — ABNORMAL LOW (ref 98–111)
Creatinine, Ser: 1.16 mg/dL (ref 0.61–1.24)
GFR, Estimated: >60 mL/min (ref >60)
Glucose, Bld: 263 mg/dL — ABNORMAL HIGH (ref 70–99)
Potassium: 3.7 mmol/L (ref 3.5–5.1)
Sodium: 136 mmol/L (ref 135–145)

## 2020-06-24 LAB — CBC
HCT: 33.6 % — ABNORMAL LOW (ref 39.0–52.0)
Hemoglobin: 8.7 g/dL — ABNORMAL LOW (ref 13.0–17.0)
MCH: 20 pg — ABNORMAL LOW (ref 26.0–34.0)
MCHC: 25.9 g/dL — ABNORMAL LOW (ref 30.0–36.0)
MCV: 77.1 fL — ABNORMAL LOW (ref 80.0–100.0)
Platelets: 67 10*3/uL — ABNORMAL LOW (ref 150–400)
RBC: 4.36 MIL/uL (ref 4.22–5.81)
RDW: 24 % — ABNORMAL HIGH (ref 11.5–15.5)
WBC: 8.1 10*3/uL (ref 4.0–10.5)
nRBC: 0.2 % (ref 0.0–0.2)

## 2020-06-24 LAB — BRAIN NATRIURETIC PEPTIDE: B Natriuretic Peptide: 235.6 pg/mL — ABNORMAL HIGH (ref 0.0–100.0)

## 2020-06-24 MED ORDER — PREDNISONE 20 MG PO TABS
40.0000 mg | ORAL_TABLET | Freq: Every day | ORAL | Status: DC
  Administered 2020-06-25 – 2020-06-26 (×2): 40 mg via ORAL
  Filled 2020-06-24 (×2): qty 2

## 2020-06-24 MED ORDER — FUROSEMIDE 10 MG/ML IJ SOLN: 40.0000 mg | Freq: Two times a day (BID) | INTRAMUSCULAR | Status: DC

## 2020-06-24 MED ORDER — IOHEXOL 300 MG/ML  SOLN
100.0000 mL | Freq: Once | INTRAMUSCULAR | Status: AC | PRN
  Administered 2020-06-24: 100 mL via INTRAVENOUS

## 2020-06-24 NOTE — Progress Notes (Signed)
[facility]                                                                              Patient Demographics  David Short, is a 50 y.o. male, DOB - [birth year], ZOX:[id number]  Admit date - 06/17/2020   Admitting Physician Charlotte SanesNicole Gonzales, MD  Outpatient Primary MD for the patient is Default, Provider, MD  Outpatient specialists:   LOS - 6  days   Medical records reviewed and are as summarized below:    Chief Complaint  Patient presents with  . Shortness of Breath       Brief summary   Patient is a 50 year old man with a history of vertebral osteomyelitis, anterior cervical discectomy,  cervical spinal fusion, Reported HX of HTN per chart, current smoker, chronic back pain, here with dyspnea for the past several weeks.  Mostly he notices dyspnea on exertion.  No dyspnea on talking.  Worsening dyspnea x 2 weeks. He also endorses orthopnea, but he is not sure of the duration (at least months).  Lower extremity edema to the knees for the past two weeks.  In the ED, patient was treated for COPD exacerbation, as well as volume overload due to CHF.Found to have a R sided pleural effusion, some atelectasis, pulm edema, ascites, anasarca.  Was given methylprednisolone, duonebs, lasix 40mg  IV, ceftriaxone and azithromycin.  Was placed on bipap due to worsening hypoxemia despite NRB, increasing work of breaking.  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, last use on Friday, April 1  Assessment & Plan    Acute, possibly on chronic CHF RV failure, acute hypoxic and hypercapnic respiratory failure -Patient was admitted to ICU due to worsening hypoxemia, despite NRB, was placed on BiPAP -2D echo showed severely enlarged right ventricular size, moderately elevated pulmonary artery systolic pressure, 52.5 mmHg, small pericardial effusion, EF 65 to 70% -Currently on IV Lasix, BNP  improved to 235, -Chest x-ray showed small right pleural effusion, interval resolution of pulmonary vascular congestion and mild interstitial edema, will decrease Lasix to 40 mg every 12 hours, (received 2 doses of Lasix today, negative balance of 17.9 L -O2 sat was 94% on 6 L, wean as tolerated  Acute COPD exacerbation -Wheezing is improving -Taper IV Solu-Medrol, start prednisone 40 mg daily tomorrow.  - Continue scheduled duo nebs, doxycycline, Brovana -Chest x-ray showed interval resolution of pulmonary edema, wheezing is improving  Right-sided pleural effusion - will attempt thoracentesis if pleural effusion not improved, patient has been on increased diuresis with Lasix -Chest x-ray today 4/8 showed small right pleural effusion, interval resolution of pulmonary edema  Chronic cervical/back pain, -Continues to ask for increasing pain medications.  Difficult situation as patient also IV drug user with heroin, high pain tolerance, has prior back surgeries.   -Palliative medicine was consulted for assistance with pain management. -Continue OxyContin to 15 mg every 12 hours, continue oxycodone and IV Dilaudid for breakthrough pain, no escalation of pain medications  Likely IV heroin drug withdrawals -Placed on clonidine withdrawal protocol, currently stable  Anemia of chronic disease, microcytic -transfused 1 unit packed RBCs on 4/3 -Baseline hemoglobin 12-13  Anasarca, ascites, history of pulmonary hypertension, TR, suspected cirrhosis, hypoalbuminemia -Complaining of diffuse abdominal pain -GI following, follow CT abdomen for ascites  Code Status: Full CODE STATUS DVT Prophylaxis:  SCDs Start: 06/18/20 0051   Level of Care: Level of care: Telemetry Cardiac Family Communication: Discussed all imaging results, lab results, explained to the patient,   Disposition Plan:     Status is: Inpatient  Remains inpatient appropriate because:Inpatient level of care appropriate due to  severity of illness   Dispo: The patient is from: Home              Anticipated d/c is to: Home              Patient currently is not medically stable to d/c.  Currently on IV Lasix for diuresis GI work-up   difficult to place patient No      Time Spent in minutes 35 minutes  Procedures:  None  Consultants:   Admitted by [facility] Palliative medicine Gastroenterology  Antimicrobials:   Anti-infectives (From admission, onward)   Start     Dose/Rate Route Frequency Ordered Stop   06/21/20 2000  ciprofloxacin (CIPRO) tablet 500 mg        500 mg Oral 2 times daily 06/21/20 1426 06/26/20 1959   06/20/20 1000  doxycycline (VIBRA-TABS) tablet 100 mg        100 mg Oral Every 12 hours 06/20/20 0841     06/17/20 2345  cefTRIAXone (ROCEPHIN) 1 g in sodium chloride 0.9 % 100 mL IVPB        1 g 200 mL/hr over 30 Minutes Intravenous  Once 06/17/20 2341 06/18/20 0119   06/17/20 2345  azithromycin (ZITHROMAX) 500 mg in sodium chloride 0.9 % 250 mL IVPB        500 mg 250 mL/hr over 60 Minutes Intravenous  Once 06/17/20 2341 06/18/20 0614         Medications  Scheduled Meds: . sodium chloride   Intravenous Once  . arformoterol  15 mcg Nebulization BID  . Chlorhexidine Gluconate Cloth  6 each Topical Daily  . ciprofloxacin  500 mg Oral BID  . cloNIDine  0.1 mg Oral QAC breakfast  . doxycycline  100 mg Oral Q12H  . furosemide  40 mg Intravenous Q8H  . hydrocortisone  25 mg Rectal BID  . lidocaine  1 patch Transdermal Q24H  . nicotine  21 mg Transdermal Daily  . oxyCODONE  15 mg Oral Q12H  . pantoprazole  40 mg Oral Daily  . [START ON 06/25/2020] predniSONE  40 mg Oral QAC breakfast   Continuous Infusions: PRN Meds:.bismuth subsalicylate, HYDROmorphone (DILAUDID) injection, hydrOXYzine, ipratropium-albuterol, loperamide, methocarbamol, ondansetron (ZOFRAN) IV, ondansetron, oxyCODONE, polyethylene glycol      Subjective:   David Short was seen and examined today.  Overall  improving, pain subjectively appears to be much more controlled although patient continues to ask for pain medications.  Complaining of abdominal pain.  No chest pain, nausea vomiting or diarrhea   Objective:   Vitals:   06/24/20 0526 06/24/20 0536 06/24/20 0918 06/24/20 1109  BP: 113/64   106/62  Pulse: 81   99  Resp: 17   18  Temp:    98 F (36.7 C)  TempSrc: Oral   Oral  SpO2: 96%  94% 90%  Weight:  60.8 kg    Height:        Intake/Output  Summary (Last 24 hours) at 06/24/2020 1424 Last data filed at 06/24/2020 1421 Gross per 24 hour  Intake 1040 ml  Output 3300 ml  Net -2260 ml     Wt Readings from Last 3 Encounters:  06/24/20 60.8 kg  12/30/18 71.2 kg  08/18/16 77.1 kg   Physical Exam  General: Alert and oriented x 3, NAD  Cardiovascular: S1 S2 clear, RRR. No pedal edema b/l  Respiratory: Mild scattered wheezing throughout  Gastrointestinal: Soft, mild diffuse TTP, NBS  Ext: no pedal edema bilaterally  Neuro: no new deficits  Musculoskeletal: No cyanosis, clubbing  Skin: No rashes  Psych: Normal affect and demeanor, alert and oriented x3     Data Reviewed:  I have personally reviewed following labs and imaging studies  Micro Results Recent Results (from the past 240 hour(s))  Resp Panel by RT-PCR (Flu A&B, Covid) Nasopharyngeal Swab     Status: None   Collection Time: 06/17/20  9:49 PM   Specimen: Nasopharyngeal Swab; Nasopharyngeal(NP) swabs in vial transport medium  Result Value Ref Range Status   SARS Coronavirus 2 by RT PCR NEGATIVE NEGATIVE Final    Comment: (NOTE) SARS-CoV-2 target nucleic acids are NOT DETECTED.  The SARS-CoV-2 RNA is generally detectable in upper respiratory specimens during the acute phase of infection. The lowest concentration of SARS-CoV-2 viral copies this assay can detect is 138 copies/mL. A negative result does not preclude SARS-Cov-2 infection and should not be used as the sole basis for treatment or other patient  management decisions. A negative result may occur with  improper specimen collection/handling, submission of specimen other than nasopharyngeal swab, presence of viral mutation(s) within the areas targeted by this assay, and inadequate number of viral copies(<138 copies/mL). A negative result must be combined with clinical observations, patient history, and epidemiological information. The expected result is Negative.  Fact Sheet for Patients:  BloggerCourse.com  Fact Sheet for Healthcare Providers:  SeriousBroker.it  This test is no t yet approved or cleared by the [place] FDA and  has been authorized for detection and/or diagnosis of SARS-CoV-2 by FDA under an Emergency Use Authorization (EUA). This EUA will remain  in effect (meaning this test can be used) for the duration of the COVID-19 declaration under Section 564(b)(1) of the Act, 21 U.S.C.section 360bbb-3(b)(1), unless the authorization is terminated  or revoked sooner.       Influenza A by PCR NEGATIVE NEGATIVE Final   Influenza B by PCR NEGATIVE NEGATIVE Final    Comment: (NOTE) The Xpert Xpress SARS-CoV-2/FLU/RSV plus assay is intended as an aid in the diagnosis of influenza from Nasopharyngeal swab specimens and should not be used as a sole basis for treatment. Nasal washings and aspirates are unacceptable for Xpert Xpress SARS-CoV-2/FLU/RSV testing.  Fact Sheet for Patients: BloggerCourse.com  Fact Sheet for Healthcare Providers: SeriousBroker.it  This test is not yet approved or cleared by the [place] FDA and has been authorized for detection and/or diagnosis of SARS-CoV-2 by FDA under an Emergency Use Authorization (EUA). This EUA will remain in effect (meaning this test can be used) for the duration of the COVID-19 declaration under Section 564(b)(1) of the Act, 21 U.S.C. section 360bbb-3(b)(1),  unless the authorization is terminated or revoked.  Performed at [facility], [address], [place], [place] [address]     Radiology Reports DG Chest 2 View  Result Date: 06/24/2020 CLINICAL DATA:  Right-sided pleural effusion EXAM: CHEST - 2 VIEW COMPARISON:  Prior chest x-ray 06/21/2020 FINDINGS:  Small right layering pleural effusion. Stable cardiomegaly. Chronic bronchitic changes, interstitial prominence and hyperinflation. Interval resolution of pulmonary vascular congestion and mild edema. No pneumothorax. No acute osseous abnormality. IMPRESSION: 1. Small right pleural effusion. 2. Interval resolution of pulmonary vascular congestion and mild interstitial edema. 3. Background pulmonary hyperinflation and chronic bronchitic changes suggest underlying COPD. Electronically Signed   By: Malachy Moan M.D.   On: 06/24/2020 07:28   CT Head Wo Contrast  Result Date: 06/17/2020 CLINICAL DATA:  Abnormal mental status, head trauma EXAM: CT HEAD WITHOUT CONTRAST TECHNIQUE: Contiguous axial images were obtained from the base of the skull through the vertex without intravenous contrast. COMPARISON:  MRI 12/13/2018 FINDINGS: Brain: No acute intracranial abnormality. Specifically, no hemorrhage, hydrocephalus, mass lesion, acute infarction, or significant intracranial injury. Vascular: No hyperdense vessel or unexpected calcification. Skull: No acute calvarial abnormality. Sinuses/Orbits: No acute findings. Mucosal thickening in the paranasal sinuses. Mastoid air cells clear. Other: None IMPRESSION: No acute intracranial abnormality. Electronically Signed   By: Charlett Nose M.D.   On: 06/17/2020 22:48   CT Chest W Contrast  Result Date: 06/17/2020 CLINICAL DATA:  Shortness of breath for 2 weeks with altered mental status, swelling and edema in the upper and lower extremities, 50% on room air EXAM: CT CHEST, ABDOMEN, AND PELVIS WITH CONTRAST TECHNIQUE: Multidetector CT imaging of the chest,  abdomen and pelvis was performed following the standard protocol during bolus administration of intravenous contrast. CONTRAST:  OMNIPAQUE IOHEXOL 300 MG/ML  SOLN COMPARISON:  Radiograph 06/17/2020 renal ultrasound 03/17/2012, CT 03/01/2012 FINDINGS: CT CHEST FINDINGS Cardiovascular: Borderline cardiomegaly with trace pericardial effusion. Extensive three-vessel coronary artery atherosclerosis. The aortic root is suboptimally assessed given cardiac pulsation artifact. Atherosclerotic plaque within the normal caliber aorta. No acute luminal abnormality of the imaged aorta. No periaortic stranding or hemorrhage. Normal 3 vessel branching of the aortic arch. Proximal great vessels are mildly calcified but otherwise unremarkable. Central pulmonary arteries are enlarged. No large central filling defects are evident within the limitations of this non tailored examination of the pulmonary arteries. Mediastinum/Nodes: Low-attenuation, borderline enlarged mediastinal and hilar lymph nodes are present including a 12 mm paratracheal lymph node (4/23), and 11 mm AP window lymph node (4/21) and a 12 mm left hilar lymph node (6/111). No acute abnormality of the trachea or esophagus. Thyroid gland and thoracic inlet are unremarkable. Lungs/Pleura: Large right and small left pleural effusion with areas of fairly uniformly enhancing atelectatic passive and dependent atelectasis though scattered fluid-filled and thickened airways are noted particularly towards the lung bases and some underlying degree of aspiration is not excluded. No pneumothorax. No other focal airspace opacity. No concerning pulmonary nodules or masses. Musculoskeletal: No acute osseous abnormality or suspicious osseous lesion. CT ABDOMEN PELVIS FINDINGS Hepatobiliary: No concerning focal liver lesion. Diffuse periportal edema. Smooth liver surface contour. No significant gallbladder wall thickening. Pericholecystic fluid is likely related to redistributed  ascites throughout the abdomen. Pancreas: No pancreatic ductal dilatation or surrounding inflammatory changes. Spleen: Slightly heterogeneous enhancement of the spleen may be related to contrast timing. Splenomegaly is present. Measuring up to 18 cm in craniocaudal dimension. Normal splenic cleft. No concerning splenic lesion. Adrenals/Urinary Tract: Normal adrenal glands. Symmetrically delayed renal enhancement and excretion. No visible focal renal lesion. No urolithiasis or hydronephrosis. Urinary bladder is unremarkable for degree of distension. Stomach/Bowel: Stomach and duodenum unremarkable. Slightly edematous appearance of the distal small bowel may be related to presence of moderate volume ascites and presumed volume third-spacing. No colonic dilatation or wall  thickening. No evidence of bowel obstruction. The appendix is not well visualized. Vascular/Lymphatic: Atherosclerotic calcifications within the abdominal aorta and branch vessels. No aneurysm or ectasia. No enlarged abdominopelvic lymph nodes. Edematous appearing mesenteric nodes as well as some borderline enlarged though low-attenuation bilateral inguinal nodes. No other conspicuous enlarged abdominopelvic nodes. Reproductive: Coarse eccentric calcification of the prostate. No concerning abnormalities of the prostate or seminal vesicles. A markedly edematous changes of the soft tissues of the included internal genitalia. Other: Moderate volume ascites with additional edematous changes throughout the mesentery. Extensive circumferential body wall edema noted as well. Tiny fat containing umbilical hernia. No bowel containing hernias. Musculoskeletal: Bony fusion across the L1-2 L3 levels may reflect sequela of prior infection or injury. Given that this is a new finding since 2013. No other acute or conspicuous osseous abnormalities. Bones of the pelvis appear intact and congruent. IMPRESSION: 1. Extensive features of anasarca with circumferential body  wall edema, pleural and pericardial effusions, periportal edema, moderate volume ascites and edematous changes of the mesentery. 2. Uniformly enhancing atelectatic passive and dependent atelectasis adjacent the basilar effusions though scattered fluid-filled and thickened airways are noted particularly towards the lung bases and some underlying degree of aspiration is not excluded. 3. Low-attenuation, borderline enlarged mediastinal hilar, and abdominopelvic nodes are nonspecific but could be reactive or edematous given features elsewhere though follow-up imaging may be warranted given the presence of splenomegaly as well. 4. Delayed renal enhancement bilaterally, nonspecific though should correlate for clinical of diminished renal function. 5. Bony fusion across the L1-2 L3 levels may reflect sequela of prior infection or injury. New finding since 2013. 6. Aortic Atherosclerosis (ICD10-I70.0). 7. Coronary artery calcifications are present. Please note that the presence of coronary artery calcium documents the presence of coronary artery disease, the severity of this disease and any potential stenosis cannot be assessed on this non-gated CT examination. Electronically Signed   By: Kreg Shropshire M.D.   On: 06/17/2020 23:07   CT ABDOMEN PELVIS W CONTRAST  Result Date: 06/17/2020 CLINICAL DATA:  Shortness of breath for 2 weeks with altered mental status, swelling and edema in the upper and lower extremities, 50% on room air EXAM: CT CHEST, ABDOMEN, AND PELVIS WITH CONTRAST TECHNIQUE: Multidetector CT imaging of the chest, abdomen and pelvis was performed following the standard protocol during bolus administration of intravenous contrast. CONTRAST:  OMNIPAQUE IOHEXOL 300 MG/ML  SOLN COMPARISON:  Radiograph 06/17/2020 renal ultrasound 03/17/2012, CT 03/01/2012 FINDINGS: CT CHEST FINDINGS Cardiovascular: Borderline cardiomegaly with trace pericardial effusion. Extensive three-vessel coronary artery  atherosclerosis. The aortic root is suboptimally assessed given cardiac pulsation artifact. Atherosclerotic plaque within the normal caliber aorta. No acute luminal abnormality of the imaged aorta. No periaortic stranding or hemorrhage. Normal 3 vessel branching of the aortic arch. Proximal great vessels are mildly calcified but otherwise unremarkable. Central pulmonary arteries are enlarged. No large central filling defects are evident within the limitations of this non tailored examination of the pulmonary arteries. Mediastinum/Nodes: Low-attenuation, borderline enlarged mediastinal and hilar lymph nodes are present including a 12 mm paratracheal lymph node (4/23), and 11 mm AP window lymph node (4/21) and a 12 mm left hilar lymph node (6/111). No acute abnormality of the trachea or esophagus. Thyroid gland and thoracic inlet are unremarkable. Lungs/Pleura: Large right and small left pleural effusion with areas of fairly uniformly enhancing atelectatic passive and dependent atelectasis though scattered fluid-filled and thickened airways are noted particularly towards the lung bases and some underlying degree of aspiration is not excluded.  No pneumothorax. No other focal airspace opacity. No concerning pulmonary nodules or masses. Musculoskeletal: No acute osseous abnormality or suspicious osseous lesion. CT ABDOMEN PELVIS FINDINGS Hepatobiliary: No concerning focal liver lesion. Diffuse periportal edema. Smooth liver surface contour. No significant gallbladder wall thickening. Pericholecystic fluid is likely related to redistributed ascites throughout the abdomen. Pancreas: No pancreatic ductal dilatation or surrounding inflammatory changes. Spleen: Slightly heterogeneous enhancement of the spleen may be related to contrast timing. Splenomegaly is present. Measuring up to 18 cm in craniocaudal dimension. Normal splenic cleft. No concerning splenic lesion. Adrenals/Urinary Tract: Normal adrenal glands.  Symmetrically delayed renal enhancement and excretion. No visible focal renal lesion. No urolithiasis or hydronephrosis. Urinary bladder is unremarkable for degree of distension. Stomach/Bowel: Stomach and duodenum unremarkable. Slightly edematous appearance of the distal small bowel may be related to presence of moderate volume ascites and presumed volume third-spacing. No colonic dilatation or wall thickening. No evidence of bowel obstruction. The appendix is not well visualized. Vascular/Lymphatic: Atherosclerotic calcifications within the abdominal aorta and branch vessels. No aneurysm or ectasia. No enlarged abdominopelvic lymph nodes. Edematous appearing mesenteric nodes as well as some borderline enlarged though low-attenuation bilateral inguinal nodes. No other conspicuous enlarged abdominopelvic nodes. Reproductive: Coarse eccentric calcification of the prostate. No concerning abnormalities of the prostate or seminal vesicles. A markedly edematous changes of the soft tissues of the included internal genitalia. Other: Moderate volume ascites with additional edematous changes throughout the mesentery. Extensive circumferential body wall edema noted as well. Tiny fat containing umbilical hernia. No bowel containing hernias. Musculoskeletal: Bony fusion across the L1-2 L3 levels may reflect sequela of prior infection or injury. Given that this is a new finding since 2013. No other acute or conspicuous osseous abnormalities. Bones of the pelvis appear intact and congruent. IMPRESSION: 1. Extensive features of anasarca with circumferential body wall edema, pleural and pericardial effusions, periportal edema, moderate volume ascites and edematous changes of the mesentery. 2. Uniformly enhancing atelectatic passive and dependent atelectasis adjacent the basilar effusions though scattered fluid-filled and thickened airways are noted particularly towards the lung bases and some underlying degree of aspiration is not  excluded. 3. Low-attenuation, borderline enlarged mediastinal hilar, and abdominopelvic nodes are nonspecific but could be reactive or edematous given features elsewhere though follow-up imaging may be warranted given the presence of splenomegaly as well. 4. Delayed renal enhancement bilaterally, nonspecific though should correlate for clinical of diminished renal function. 5. Bony fusion across the L1-2 L3 levels may reflect sequela of prior infection or injury. New finding since 2013. 6. Aortic Atherosclerosis (ICD10-I70.0). 7. Coronary artery calcifications are present. Please note that the presence of coronary artery calcium documents the presence of coronary artery disease, the severity of this disease and any potential stenosis cannot be assessed on this non-gated CT examination. Electronically Signed   By: Kreg Shropshire M.D.   On: 06/17/2020 23:07   DG Chest Port 1 View  Result Date: 06/21/2020 CLINICAL DATA:  Shortness of breath. EXAM: PORTABLE CHEST 1 VIEW COMPARISON:  Chest x-ray 06/20/2020.  Chest CT 06/17/2020. FINDINGS: Heart size upper normal. Vascular congestion again noted with diffuse interstitial opacity. Hazy opacity at the right base compatible with known pleural effusion seen on previous CT. The visualized bony structures of the thorax show no acute abnormality. Telemetry leads overlie the chest. IMPRESSION: No substantial change since chest x-ray from yesterday. Electronically Signed   By: Kennith Center M.D.   On: 06/21/2020 13:30   DG Chest Port 1 View  Result Date: 06/20/2020 CLINICAL  DATA:  50 year old male with chest pain and shortness of breath. Smoker. EXAM: PORTABLE CHEST 1 VIEW COMPARISON:  CT Chest, Abdomen, and Pelvis 06/17/2020 and earlier. FINDINGS: Portable AP semi upright view at 0901 hours. Significantly regressed right pleural effusion. Small residual suspected. Decreased but not resolved bilateral perihilar interstitial opacity slightly greater on the right. Normal cardiac  size and mediastinal contours. Visualized tracheal air column is within normal limits. No pneumothorax. No areas of worsening ventilation. Paucity of bowel gas in the upper abdomen. No acute osseous abnormality identified. Prior cervical ACDF. IMPRESSION: 1. Regressed but not resolved right pleural effusion and bilateral perihilar opacity, most resembling vascular congestion. 2. No new cardiopulmonary abnormality. Electronically Signed   By: Odessa Fleming M.D.   On: 06/20/2020 09:16   DG Chest Portable 1 View  Result Date: 06/17/2020 CLINICAL DATA:  Shortness of breath EXAM: PORTABLE CHEST 1 VIEW COMPARISON:  12/12/2018 FINDINGS: Shallow inspiration. Small to moderate right pleural effusion with consolidation in the right lung base. Linear atelectasis or infiltration in the left base. Cardiac enlargement. No vascular congestion. Calcification of the aorta. IMPRESSION: Small to moderate right pleural effusion with consolidation in the right lung base. Linear atelectasis or infiltration in the left base. Electronically Signed   By: Burman Nieves M.D.   On: 06/17/2020 20:18   ECHOCARDIOGRAM COMPLETE  Result Date: 06/18/2020    ECHOCARDIOGRAM REPORT   Patient Name:   David Short Date of Exam: 06/18/2020 Medical Rec #:  [id number]         Height:       72.0 in Accession #:    [id number]        Weight:       166.0 lb Date of Birth:  [birth year]         BSA:          1.968 m Patient Age:    49 years          BP:           115/71 mmHg Patient Gender: M                 HR:           101 bpm. Exam Location:  Inpatient Procedure: 2D Echo, Color Doppler and Cardiac Doppler Indications:    I51.7 Cardiomegaly; Acute Respiratory Distress R06.03  History:        Patient has no prior history of Echocardiogram examinations.  Sonographer:    Tiffany Dance Referring Phys: [id number] Charlotte Sanes IMPRESSIONS  1. Right ventricular systolic function is normal. The right ventricular size is severely enlarged. There is moderately  elevated pulmonary artery systolic pressure. The estimated right ventricular systolic pressure is 52.5 mmHg. Right ventricular apex not well visualized. Coronary sinus dilation consistent with right ventricular overload.  2. Right atrial size was severely dilated.  3. A small pericardial effusion is present. The pericardial effusion is circumferential. There is no evidence of increased pericardial pressure.  4. Left ventricular ejection fraction, by estimation, is 65 to 70%. The left ventricle has normal function. The left ventricle has no regional wall motion abnormalities. There is mild concentric left ventricular hypertrophy. Left ventricular diastolic parameters were normal. There is the interventricular septum is flattened in systole, consistent with right ventricular pressure overload.  5. Tricuspid valve regurgitation is moderate and eccentric best seen in A4c view .  6. Left atrial size was mildly dilated.  7. The mitral valve is normal in structure. Trivial mitral valve regurgitation.  8. The aortic valve is tricuspid. Aortic valve regurgitation is not visualized. No aortic stenosis is present.  9. The inferior vena cava is dilated in size with <50% respiratory variability, suggesting right atrial pressure of 15 mmHg. Comparison(s): No prior Echocardiogram. FINDINGS  Left Ventricle: Left ventricular ejection fraction, by estimation, is 65 to 70%. The left ventricle has normal function. The left ventricle has no regional wall motion abnormalities. The left ventricular internal cavity size was normal in size. There is  mild concentric left ventricular hypertrophy. The interventricular septum is flattened in systole, consistent with right ventricular pressure overload. Left ventricular diastolic parameters were normal. Right Ventricle: The right ventricular size is severely enlarged. No increase in right ventricular wall thickness. Right ventricular systolic function is normal. There is moderately elevated  pulmonary artery systolic pressure. The tricuspid regurgitant velocity is 3.06 m/s, and with an assumed right atrial pressure of 15 mmHg, the estimated right ventricular systolic pressure is 52.5 mmHg. Left Atrium: Left atrial size was mildly dilated. Right Atrium: Right atrial size was severely dilated. Pericardium: A small pericardial effusion is present. The pericardial effusion is circumferential. There is no evidence of cardiac tamponade. Mitral Valve: The mitral valve is normal in structure. Trivial mitral valve regurgitation. Tricuspid Valve: The tricuspid valve is normal in structure. Tricuspid valve regurgitation is moderate . No evidence of tricuspid stenosis. Aortic Valve: The aortic valve is tricuspid. Aortic valve regurgitation is not visualized. No aortic stenosis is present. Pulmonic Valve: Discrepancy between Spectral Doppler and Color Doppler. The pulmonic valve was grossly normal. Pulmonic valve regurgitation is not visualized. No evidence of pulmonic stenosis. Aorta: The aortic root and ascending aorta are structurally normal, with no evidence of dilitation. Venous: The inferior vena cava is dilated in size with less than 50% respiratory variability, suggesting right atrial pressure of 15 mmHg. IAS/Shunts: The atrial septum is grossly normal.  LEFT VENTRICLE PLAX 2D LVIDd:         4.80 cm  Diastology LVIDs:         2.30 cm  LV e' medial:    11.00 cm/s LV PW:         1.30 cm  LV E/e' medial:  8.5 LV IVS:        1.10 cm  LV e' lateral:   10.30 cm/s LVOT diam:     2.00 cm  LV E/e' lateral: 9.0 LV SV:         75 LV SV Index:   38 LVOT Area:     3.14 cm  RIGHT VENTRICLE             IVC RV Basal diam:  3.80 cm     IVC diam: 3.30 cm RV Mid diam:    2.80 cm RV S prime:     16.50 cm/s TAPSE (M-mode): 1.9 cm LEFT ATRIUM              Index       RIGHT ATRIUM           Index LA diam:        5.60 cm  2.84 cm/m  RA Area:     28.50 cm LA Vol (A2C):   122.0 ml 61.98 ml/m RA Volume:   111.00 ml 56.39 ml/m LA  Vol (A4C):   46.7 ml  23.73 ml/m LA Biplane Vol: 75.8 ml  38.51 ml/m  AORTIC VALVE LVOT Vmax:   123.00 cm/s LVOT Vmean:  82.400 cm/s LVOT VTI:    0.239 m  AORTA Ao Root diam: 3.60 cm Ao Asc diam:  3.30 cm MITRAL VALVE               TRICUSPID VALVE MV Area (PHT): 3.48 cm    TR Peak grad:   37.5 mmHg MV Decel Time: 218 msec    TR Vmax:        306.00 cm/s MV E velocity: 93.00 cm/s MV A velocity: 88.10 cm/s  SHUNTS MV E/A ratio:  1.06        Systemic VTI:  0.24 m                            Systemic Diam: 2.00 cm Riley Lam MD Electronically signed by Riley Lam MD Signature Date/Time: 06/18/2020/4:36:14 PM    Final     Lab Data:  CBC: Recent Labs  Lab 06/17/20 1948 06/17/20 2006 06/18/20 1610 06/19/20 0108 06/20/20 0645 06/21/20 9604 06/21/20 1323 06/22/20 0407 06/23/20 0438 06/24/20 0141  WBC 9.2  --  8.2   < > 8.6 5.0  --  6.4 9.0 8.1  NEUTROABS 6.7  --  7.2  --   --   --   --   --   --   --   HGB 6.4*   < > 6.9*   < > 7.9* 6.8* 8.1* 8.3* 9.6* 8.7*  HCT 29.4*   < > 29.4*   < > 31.8* 27.3* 31.8* 31.0* 36.6* 33.6*  MCV 76.8*  --  76.4*   < > 78.3* 76.0*  --  76.5* 76.4* 77.1*  PLT 127*  --  101*   < > 92* 75*  --  69* 74* 67*   < > = values in this interval not displayed.   Basic Metabolic Panel: Recent Labs  Lab 06/20/20 0645 06/21/20 0416 06/22/20 0407 06/23/20 0438 06/24/20 0141  NA 135 135 138 136 136  K 4.4 3.9 4.0 3.6 3.7  CL 94* 90* 89* 88* 88*  CO2 37* 37* 40* 39* 38*  GLUCOSE 70 170* 134* 215* 263*  BUN 38* 38* 46* 59* 63*  CREATININE 1.23 1.02 0.98 1.10 1.16  CALCIUM 8.1* 8.1* 8.4* 8.1* 8.2*   GFR: Estimated Creatinine Clearance: 65.5 mL/min (by C-G formula based on SCr of 1.16 mg/dL). Liver Function Tests: Recent Labs  Lab 06/17/20 1948 06/18/20 0940  AST 34 27  ALT 15 14  ALKPHOS 105 87  BILITOT 1.4* 1.0  PROT 8.2* 7.4  ALBUMIN 2.2* 1.9*   No results for input(s): LIPASE, AMYLASE in the last 168 hours. Recent Labs  Lab  06/17/20 1958  AMMONIA 52*   Coagulation Profile: Recent Labs  Lab 06/17/20 2101 06/22/20 0407 06/23/20 0438  INR 1.5* 1.5* 1.6*   Cardiac Enzymes: No results for input(s): CKTOTAL, CKMB, CKMBINDEX, TROPONINI in the last 168 hours. BNP (last 3 results) No results for input(s): PROBNP in the last 8760 hours. HbA1C: Recent Labs    06/23/20 0627  HGBA1C 5.6   CBG: No results for input(s): GLUCAP in the last 168 hours. Lipid Profile: No results for input(s): CHOL, HDL, LDLCALC, TRIG, CHOLHDL, LDLDIRECT in the last 72 hours. Thyroid Function Tests: No results for input(s): TSH, T4TOTAL, FREET4, T3FREE, THYROIDAB in the last 72 hours. Anemia Panel: No results for input(s): VITAMINB12, FOLATE, FERRITIN, TIBC, IRON, RETICCTPCT in the last 72 hours. Urine analysis:    Component Value Date/Time   COLORURINE YELLOW 06/17/2020 2340   APPEARANCEUR HAZY (A) 06/17/2020  2340   LABSPEC 1.010 06/17/2020 2340   PHURINE 5.0 06/17/2020 2340   GLUCOSEU NEGATIVE 06/17/2020 2340   HGBUR LARGE (A) 06/17/2020 2340   BILIRUBINUR NEGATIVE 06/17/2020 2340   KETONESUR NEGATIVE 06/17/2020 2340   PROTEINUR 30 (A) 06/17/2020 2340   UROBILINOGEN 1.0 03/17/2012 0047   NITRITE NEGATIVE 06/17/2020 2340   LEUKOCYTESUR NEGATIVE 06/17/2020 2340     David Short M.D. [facility] 06/24/2020, 2:24 PM  Available via Epic secure chat 7am-7pm After 7 pm, please refer to night coverage provider listed on amion.

## 2020-06-24 NOTE — Plan of Care (Signed)

## 2020-06-24 NOTE — Progress Notes (Deleted)
Patient refused CPAP tonight 

## 2020-06-24 NOTE — Progress Notes (Signed)
Heart Failure Navigator Progress Note  Assessed for Heart & Vascular TOC clinic readiness.  Unfortunately at this time the patient does not meet criteria due to palliative care GOC consult.   Navigator available for reassessment of patient.   Ozella Rocks, RN, BSN Heart Failure Nurse Navigator [phone]

## 2020-06-24 NOTE — Progress Notes (Addendum)
Gastroenterology Progress Note  CC:   Anemia, FOBT negative  Subjective: He complains of having a cough, can't cough up phlegm. SOB decreased but he acknowledges he is wheezing today. Recently received neb tx by respiratory therapist. No CP. No N/V. He continues to have generalized abdominal pain, more pronounced to the central upper abdomen. Appetite is good. He reported passing a normal formed brown BM last night. No rectal bleeding or melena. Urinated clear yellow urine several times this am. No family at the bedside.   Objective:  Vital signs in last 24 hours: Temp:  [97.6 F (36.4 C)-98.3 F (36.8 C)] 98.3 F (36.8 C) (04/08 0002) Pulse Rate:  [81-110] 81 (04/08 0526) Resp:  [16-18] 17 (04/08 0526) BP: (108-115)/(64-79) 113/64 (04/08 0526) SpO2:  [93 %-96 %] 94 % (04/08 0918) Weight:  [60.8 kg] 60.8 kg (04/08 0536) Last BM Date: 06/22/20   General:  50 year old chronically ill fatigued appearing male in NAD.  Eyes: No scleral icterus.  Heart: RRR, no murmur.  Pulm: Scattered expiratory wheezes throughout. On oxygen 6L [place].  Abdomen: Less distended, no obvious ascites. Soft. Diffusely tender to light palpation, moderate tenderness to the epigastrium to the umbilicus without rebound or guarding. + Bs x 4 quads. No hepatomegaly. + Splenomegaly per image studies.  Extremities:  Without edema. Neurologic:  Alert and  oriented x 4. UEs tremulous without asterixis. Upper and lower extremities weak. Speech is clear.  Psych:  Alert and cooperative. Normal mood and affect. Not anxious at this time.   Intake/Output from previous day: 04/07 0701 - 04/08 0700 In: 1080 [P.O.:1080] Out: 4000 [Urine:4000] Intake/Output this shift: Total I/O In: 280 [P.O.:280] Out: 700 [Urine:700]  Lab Results: Recent Labs    06/22/20 0407 06/23/20 0438 06/24/20 0141  WBC 6.4 9.0 8.1  HGB 8.3* 9.6* 8.7*  HCT 31.0* 36.6* 33.6*  PLT 69* 74* 67*   BMET Recent Labs    06/22/20 0407  06/23/20 0438 06/24/20 0141  NA 138 136 136  K 4.0 3.6 3.7  CL 89* 88* 88*  CO2 40* 39* 38*  GLUCOSE 134* 215* 263*  BUN 46* 59* 63*  CREATININE 0.98 1.10 1.16  CALCIUM 8.4* 8.1* 8.2*   LFT No results for input(s): PROT, ALBUMIN, AST, ALT, ALKPHOS, BILITOT, BILIDIR, IBILI in the last 72 hours. PT/INR Recent Labs    06/22/20 0407 06/23/20 0438  LABPROT 17.9* 18.5*  INR 1.5* 1.6*   Hepatitis Panel No results for input(s): HEPBSAG, HCVAB, HEPAIGM, HEPBIGM in the last 72 hours.  DG Chest 2 View  Result Date: 06/24/2020 CLINICAL DATA:  Right-sided pleural effusion EXAM: CHEST - 2 VIEW COMPARISON:  Prior chest x-ray 06/21/2020 FINDINGS: Small right layering pleural effusion. Stable cardiomegaly. Chronic bronchitic changes, interstitial prominence and hyperinflation. Interval resolution of pulmonary vascular congestion and mild edema. No pneumothorax. No acute osseous abnormality. IMPRESSION: 1. Small right pleural effusion. 2. Interval resolution of pulmonary vascular congestion and mild interstitial edema. 3. Background pulmonary hyperinflation and chronic bronchitic changes suggest underlying COPD. Electronically Signed   By: Jacqulynn Cadet M.D.   On: 06/24/2020 07:28    Assessment / Plan:  8. 50 year old male admitted to the hospital with SOB, LE edema and upper abdominal pain.Laboratory studies identified profound IDA with Hglevel of6.5 (baseline Hg level 12-13). FOBT negative. Transfused 2 units of PRBCs 4/2 -4/3.Hg 7.9 -> Hg 6.8. 3rd unit of PRBCs transfused 4/5 -> Hg 8.1 -> Hg 8.3. Today Hg 8.7.  Cr 110 ->  1.16. CTAP 4/1 showed a normal stomach, mildly edematous appearance of the distal small bowel likely due to abdominal ascites and a normal colon. No intra abdominal or pelvic mass. Unlikely enough ascites for a diagnostic paracentesis.  He was placed on Cipro 500 mg po twice daily 4/5 for SBP prophylaxis. WBC 8.1. He remains afebrile. He continues to have generalized  abdominal pain, worse to the epigastric area. No N/V.  -Repeat CBC in am -Transfuse for Hg < 7 -ContinuePantoprazole 40 mg po QD -Pain management per the hospitalist  -No plans for endoscopic evaluation until respiratory status stabilized, -Consider repeat CTAP due to persistent generalized abdominal pain, worse to the epigastric area -> umbilicus, await further recommendations per Dr. Silverio Decamp -No NSAIDs, Naproxen discontinued   2. Acute respiratory failure/COPD exacerbationon 4L [place].Chest CT 4/1 showedborderline enlarged mediastinal/hilar lymph nodes, large right and small left pleural effusion. No concerning pulmonary nodules or masses. Chest xray today showed a small right pleural effusion with interval resolution of pulmonary congestin and mild edema. . On Solumedrol IV to transition to Prednisone po,  Albuterol and Brovana neb tx. IV antibiotic ->Doxy po.  -Consider right thoracentesis if right pleural effusion worsens, defer to the medical team   3. Acute systolic heart failure.2D echo showed severely enlarged right ventricular size, moderately elevated pulmonary artery systolic pressure, small pericardial effusion, EF 65 to 70%.On Lasix 44m IV Q 8 hrs.   4. ThrombocytopeniawithSplenomegalyPLT 69.CTAP showed a normal liver, however, cirrhosis suspected.   5. Coagulopathy. INR 1.6 on 4/7  -Repeat INR in am  6. Abdominal ascites/anasarca secondary to pulmonary HTN, TR, suspected cirrhosis and hypoalbuminemia.  Albumin 1.9. Urine protein 30. Normal LFTs, T. Bili and Alk phos levels. Unlikely enough ascites for paracentesis.    7. Active IV drug use (Heroin an Fentanyl). Last Iv drug use was on 06/17/2020. Past alcohol abuse, abstinent from alcohol since 2004 or 2005.       Active Problems:   CHF (congestive heart failure) (HCC)   Other ascites   Iron deficiency anemia     LOS: 6 days   CNoralyn Pick 06/24/2020, 9:36 AM   Attending physician's  note   I have taken an interval history, reviewed the chart and examined the patient. I agree with the Advanced Practitioner's note, impression and recommendations.   50year old male with history of COPD, acute hypoxic respiratory failure, CHF decompensated cirrhosis with ascites  MELD Na score 14  Complains of significant generalized abdominal pain, patient is tender to to superficial touch, unable to examine adequately. ?  Secondary gain/seeking pain medication.  He has history of IV drug abuse  Repeat CT abdomen pelvis with contrast to further evaluate and exclude any acute pathology  Diagnostic paracentesis if he has adequate pocket of ascites for sampling to exclude SBP  Chronic iron deficiency anemia No evidence of overt or occult GI bleed, will defer endoscopic evaluation for now.  Consider EGD/colonoscopy as inpatient prior to discharge once cardiopulmonary status improves or outpatient  Continue pantoprazole and diet as tolerated Monitor hemoglobin and transfuse if below 7  GI will continue to follow along  I have spent >35 minutes of patient care (this includes precharting, chart review, review of results, face-to-face time used for counseling as well as treatment plan and follow-up. The patient was provided an opportunity to ask questions and all were answered. The patient agreed with the plan and demonstrated an understanding of the instructions.  KDamaris Hippo, MD [phone]

## 2020-06-25 DIAGNOSIS — D696 Thrombocytopenia, unspecified: Secondary | ICD-10-CM

## 2020-06-25 DIAGNOSIS — J90 Pleural effusion, not elsewhere classified: Secondary | ICD-10-CM

## 2020-06-25 LAB — BASIC METABOLIC PANEL
Anion gap: 9 (ref 5–15)
BUN: 61 mg/dL — ABNORMAL HIGH (ref 6–20)
CO2: 41 mmol/L — ABNORMAL HIGH (ref 22–32)
Calcium: 8.7 mg/dL — ABNORMAL LOW (ref 8.9–10.3)
Chloride: 89 mmol/L — ABNORMAL LOW (ref 98–111)
Creatinine, Ser: 1.34 mg/dL — ABNORMAL HIGH (ref 0.61–1.24)
GFR, Estimated: >60 mL/min (ref >60)
Glucose, Bld: 152 mg/dL — ABNORMAL HIGH (ref 70–99)
Potassium: 3.8 mmol/L (ref 3.5–5.1)
Sodium: 139 mmol/L (ref 135–145)

## 2020-06-25 LAB — CBC
HCT: 34 % — ABNORMAL LOW (ref 39.0–52.0)
Hemoglobin: 8.7 g/dL — ABNORMAL LOW (ref 13.0–17.0)
MCH: 20 pg — ABNORMAL LOW (ref 26.0–34.0)
MCHC: 25.6 g/dL — ABNORMAL LOW (ref 30.0–36.0)
MCV: 78 fL — ABNORMAL LOW (ref 80.0–100.0)
Platelets: 67 10*3/uL — ABNORMAL LOW (ref 150–400)
RBC: 4.36 MIL/uL (ref 4.22–5.81)
RDW: 24.1 % — ABNORMAL HIGH (ref 11.5–15.5)
WBC: 8.3 10*3/uL (ref 4.0–10.5)
nRBC: 0 % (ref 0.0–0.2)

## 2020-06-25 LAB — HEPATIC FUNCTION PANEL
ALT: 20 U/L (ref 0–44)
AST: 23 U/L (ref 15–41)
Albumin: 2.4 g/dL — ABNORMAL LOW (ref 3.5–5.0)
Alkaline Phosphatase: 80 U/L (ref 38–126)
Bilirubin, Direct: 0.2 mg/dL (ref 0.0–0.2)
Indirect Bilirubin: 0.7 mg/dL (ref 0.3–0.9)
Total Bilirubin: 0.9 mg/dL (ref 0.3–1.2)
Total Protein: 6.5 g/dL (ref 6.5–8.1)

## 2020-06-25 LAB — PROTIME-INR
INR: 1.4 — ABNORMAL HIGH (ref 0.8–1.2)
Prothrombin Time: 16.8 seconds — ABNORMAL HIGH (ref 11.4–15.2)

## 2020-06-25 MED ORDER — HYDROMORPHONE HCL 1 MG/ML IJ SOLN
1.0000 mg | INTRAMUSCULAR | Status: DC | PRN
  Administered 2020-06-25 – 2020-06-26 (×6): 1 mg via INTRAVENOUS
  Filled 2020-06-25 (×5): qty 1

## 2020-06-25 MED ORDER — OXYCODONE HCL ER 10 MG PO T12A
10.0000 mg | EXTENDED_RELEASE_TABLET | Freq: Two times a day (BID) | ORAL | Status: DC
Start: 2020-06-25 — End: 2020-06-26
  Administered 2020-06-25 – 2020-06-26 (×2): 10 mg via ORAL
  Filled 2020-06-25 (×3): qty 1

## 2020-06-25 NOTE — Plan of Care (Signed)
  Problem: Activity: Goal: Risk for activity intolerance will decrease Outcome: Progressing   Problem: Coping: Goal: Level of anxiety will decrease Outcome: Progressing   Problem: Elimination: Goal: Will not experience complications related to bowel motility Outcome: Progressing Goal: Will not experience complications related to urinary retention Outcome: Progressing   

## 2020-06-25 NOTE — Plan of Care (Signed)

## 2020-06-25 NOTE — Progress Notes (Addendum)
[facility] Progress Note  Chief Complaint: Iron deficiency anemia  History:  Jamason was seen along with his mother at the bedside.  He feels his breathing is slowly improving the last few days.  He still has abdominal pain that is sometimes sharp at times in various locations.  He is still quite anxious about his overall condition.  Appetite reportedly good and his mother says he is having regular bowel movements of normal character.  ROS: Cardiovascular: Denies chest pain Respiratory: Dyspneic, nonproductive cough Urinary: No dysuria  Objective:   Current Facility-Administered Medications:  .  0.9 %  sodium chloride infusion (Manually program via Guardrails IV Fluids), , Intravenous, Once, Rai, Ripudeep K, MD .  arformoterol (BROVANA) nebulizer solution 15 mcg, 15 mcg, Nebulization, BID, Rai, Ripudeep K, MD, 15 mcg at 06/25/20 0735 .  bismuth subsalicylate (PEPTO BISMOL) 262 MG/15ML suspension 30 mL, 30 mL, Oral, Q4H PRN, Bobette Mo, MD .  Chlorhexidine Gluconate Cloth 2 % PADS 6 each, 6 each, Topical, Daily, Charlott Holler, MD, 6 each at 06/25/20 1011 .  ciprofloxacin (CIPRO) tablet 500 mg, 500 mg, Oral, BID, Danis, Starr [place] III, MD, 500 mg at 06/25/20 1007 .  doxycycline (VIBRA-TABS) tablet 100 mg, 100 mg, Oral, Q12H, Rai, Ripudeep K, MD, 100 mg at 06/25/20 1008 .  hydrocortisone (ANUSOL-HC) suppository 25 mg, 25 mg, Rectal, BID, Rai, Ripudeep K, MD, 25 mg at 06/24/20 2143 .  HYDROmorphone (DILAUDID) injection 1 mg, 1 mg, Intravenous, Q4H PRN, Rai, Ripudeep K, MD, 1 mg at 06/25/20 1112 .  ipratropium-albuterol (DUONEB) 0.5-2.5 (3) MG/3ML nebulizer solution 3 mL, 3 mL, Nebulization, QID PRN, Rai, Ripudeep K, MD, 3 mL at 06/24/20 0924 .  lidocaine (LIDODERM) 5 % 1 patch, 1 patch, Transdermal, Q24H, Rai, Ripudeep K, MD, 1 patch at 06/25/20 1007 .  nicotine (NICODERM CQ - dosed in mg/24 hours) patch 21 mg, 21 mg, Transdermal, Daily, Rai, Ripudeep K, MD, 21 mg at 06/25/20 1011 .   ondansetron (ZOFRAN) injection 4 mg, 4 mg, Intravenous, Q6H PRN, Charlott Holler, MD, 4 mg at 06/18/20 1715 .  oxyCODONE (Oxy IR/ROXICODONE) immediate release tablet 10 mg, 10 mg, Oral, Q4H PRN, Charlott Holler, MD, 10 mg at 06/25/20 1008 .  oxyCODONE (OXYCONTIN) 12 hr tablet 10 mg, 10 mg, Oral, Q12H, Rai, Ripudeep K, MD .  pantoprazole (PROTONIX) EC tablet 40 mg, 40 mg, Oral, Daily, Charlott Holler, MD, 40 mg at 06/25/20 1009 .  polyethylene glycol (MIRALAX / GLYCOLAX) packet 17 g, 17 g, Oral, Daily PRN, Charlott Holler, MD .  predniSONE (DELTASONE) tablet 40 mg, 40 mg, Oral, QAC breakfast, Rai, Ripudeep K, MD, 40 mg at 06/25/20 0530     Vital signs in last 24 hrs: Vitals:   06/25/20 1113 06/25/20 1118  BP:  114/73  Pulse:  96  Resp:  17  Temp:  98.3 F (36.8 C)  SpO2: 98% 96%    Intake/Output Summary (Last 24 hours) at 06/25/2020 1139 Last data filed at 06/25/2020 0410 Gross per 24 hour  Intake 1240 ml  Output 2550 ml  Net -1310 ml     Physical Exam Debilitated, looks older than stated age.  Laying on his side in bed, affect somewhat anxious but less so than when I saw him several days ago.  HEENT: sclera anicteric, oral mucosa without lesions, poor dentition  Neck: supple, no thyromegaly, JVD or lymphadenopathy  Cardiac: RRR without murmurs, S1S2 heard, no peripheral edema  Pulm: Respiratory rate 20,  prolonged expiratory phase no accessory muscle use or distress  Abdomen: soft, multiple areas of tenderness to light palpation of abdominal wall, active bowel sounds of normal character.  Difficult to assess hepatosplenomegaly due to abdominal girth  Skin; warm and dry, no jaundice.  Multiple tattoos  Recent Labs:  CBC Latest Ref Rng & Units 06/25/2020 06/24/2020 06/23/2020  WBC 4.0 - 10.5 K/uL 8.3 8.1 9.0  Hemoglobin 13.0 - 17.0 g/dL 3.5(H) 2.9(J) 2.4(Q)  Hematocrit 39.0 - 52.0 % 34.0(L) 33.6(L) 36.6(L)  Platelets 150 - 400 K/uL 67(L) 67(L) 74(L)    Recent Labs  Lab  06/25/20 0318  INR 1.4*   CMP Latest Ref Rng & Units 06/25/2020 06/24/2020 06/23/2020  Glucose 70 - 99 mg/dL 683(M) 196(Q) 229(N)  BUN 6 - 20 mg/dL 98(X) 21(J) 94(R)  Creatinine 0.61 - 1.24 mg/dL 7.40(C) 1.44 8.18  Sodium 135 - 145 mmol/L 139 136 136  Potassium 3.5 - 5.1 mmol/L 3.8 3.7 3.6  Chloride 98 - 111 mmol/L 89(L) 88(L) 88(L)  CO2 22 - 32 mmol/L 41(H) 38(H) 39(H)  Calcium 8.9 - 10.3 mg/dL 5.6(D) 1.4(H) 8.1(L)  Total Protein 6.5 - 8.1 g/dL 6.5 - -  Total Bilirubin 0.3 - 1.2 mg/dL 0.9 - -  Alkaline Phos 38 - 126 U/L 80 - -  AST 15 - 41 U/L 23 - -  ALT 0 - 44 U/L 20 - -     Radiologic studies: CLINICAL DATA:  Epigastric pain Generalized abdominal pain, reassess for ascites. Diffuse abdominal pain. 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   EXAM: CT ABDOMEN AND PELVIS WITH CONTRAST   TECHNIQUE: Multidetector CT imaging of the abdomen and pelvis was performed using the standard protocol following bolus administration of intravenous contrast.   CONTRAST:  OMNIPAQUE IOHEXOL 300 MG/ML  SOLN   COMPARISON:  CT abdomen pelvis 06/17/2020, CT abdomen pelvis 03/01/2012   FINDINGS: Lower chest: Interval decrease in size of a small right pleural effusion. Bilateral lower lobe atelectasis versus scarring. Coronary artery calcifications.   Hepatobiliary: No focal liver abnormality. No gallstones, gallbladder wall thickening, or pericholecystic fluid. No biliary dilatation.   Pancreas: No focal lesion. Normal pancreatic contour. No surrounding inflammatory changes. No main pancreatic ductal dilatation.   Spleen: The spleen is enlarged measuring up to 18 cm (new from 2013). No focal splenic lesion.   Adrenals/Urinary Tract:   No adrenal nodule bilaterally.   Bilateral kidneys enhance symmetrically. No hydronephrosis. No hydroureter.   The urinary bladder is unremarkable.   Stomach/Bowel: PO contrast reaches the cecum. Stomach is within normal limits. No evidence of bowel wall  thickening or dilatation. Stool throughout the colon. The appendix not definitely identified.   Vascular/Lymphatic: No abdominal aorta or iliac aneurysm. Mild atherosclerotic plaque of the aorta and its branches. Question prominent retroperitoneal lymph nodes without definite lymphadenopathy. No abdominal, pelvic, or inguinal lymphadenopathy.   Reproductive: Prostate is unremarkable.   Other: No intraperitoneal free fluid. No intraperitoneal free gas. No organized fluid collection.   Musculoskeletal:   No abdominal wall hernia or abnormality.   No suspicious lytic or blastic osseous lesions. No acute displaced fracture. Multilevel degenerative changes of the spine.   IMPRESSION: 1. Interval decrease in size of a small right pleural effusion. 2. Stable splenomegaly (18cm that is new from 2013). Question prominent retroperitoneal lymph nodes without definite lymphadenopathy. Findings suggestive of a lymphoproliferative disorder. Consider PET-CT for further evaluation. 3. Constipation. 4.  Aortic Atherosclerosis (ICD10-I70.0) - mild.     Electronically Signed   By: Blanchie Serve  Tessie Fass M.D.   On: 06/24/2020 19:53     Assessment & Plan  Assessment: Iron deficiency anemia, heme-negative.  Abdominal pain without visible cause on imaging, including repeat CT yesterday.  With exam findings and overall scenario, I believe this may be related to substance withdrawal. Reassured him and his mother that there were no acute findings on CT scan.  His volume overload is much improved from when I last saw him several days ago.  Looks like diuretics have been scaled back because of prerenal azotemia, for which she is at greater risk due to his severe hypoalbuminemia.  Respiratory distress with COPD and right-sided pleural effusion, perhaps hepatic hydrothorax and currently being treated for suspected pneumonia as well.  While his respiratory status is improved, he still has a way to go to be  in better condition for sedation for endoscopic procedures to work-up the anemia, especially since they are not urgent for that indication.  They are likely be of low yield for cause of the abdominal pain. Whether or not these are done inpatient versus outpatient depends on his clinical progress in the next couple of days and interventions feelings about this patient's overall status and tentative discharge and follow-up plans.  Thrombocytopenia and splenomegaly, possibly from portal hypertension, though difficult to be certain of this based on complex initial presentation and lack of obvious cirrhotic findings on CT scan.  Patient should have an outpatient hematology evaluation for consideration of primary hematologic disorder.  We will check back on this patient Monday.  Call sooner if needed.  I spent a total of 35 minutes with the patient reviewing hospital notes, imaging reports, pathology (if applicable),  labs and examining the patient as well as establishing an assessment and plan that was discussed with the patient.  > 50% of time was spent in direct patient care.    Charlie Pitter III Office: [phone]

## 2020-06-25 NOTE — Progress Notes (Signed)
   06/25/20 2100  Assess: MEWS Score  Temp 98.2 F (36.8 C)  BP 126/86  Pulse Rate (!) 115  Resp 20  Level of Consciousness Alert  SpO2 93 %  O2 Device Nasal Cannula  Assess: MEWS Score  MEWS Temp 0  MEWS Systolic 0  MEWS Pulse 2  MEWS RR 0  MEWS LOC 0  MEWS Score 2  MEWS Score Color Yellow  Assess: if the MEWS score is Yellow or Red  Were vital signs taken at a resting state? Yes  Focused Assessment No change from prior assessment  Early Detection of Sepsis Score *See Row Information* Low  MEWS guidelines implemented *See Row Information* Yes  Treat  MEWS Interventions Administered prn meds/treatments  Pain Scale 0-10  Pain Score 10  Pain Type Chronic pain  Pain Location Back  Pain Orientation Lower  Take Vital Signs  Increase Vital Sign Frequency  Yellow: Q 2hr X 2 then Q 4hr X 2, if remains yellow, continue Q 4hrs  Escalate  MEWS: Escalate Yellow: discuss with charge nurse/RN and consider discussing with provider and RRT  Notify: Charge Nurse/RN  Name of Charge Nurse/RN Notified Jaquetta, RN  Date Charge Nurse/RN Notified 06/25/20  Time Charge Nurse/RN Notified 2111  Document  Progress note created (see row info) Yes    Patient experiencing 10/10 back pain. PRN medications given. See [facility]

## 2020-06-25 NOTE — Plan of Care (Signed)
  Problem: Clinical Measurements: Goal: Ability to maintain clinical measurements within normal limits will improve Outcome: Progressing Goal: Will remain free from infection Outcome: Progressing Goal: Diagnostic test results will improve Outcome: Progressing Goal: Respiratory complications will improve Outcome: Progressing Goal: Cardiovascular complication will be avoided Outcome: Progressing   Problem: Nutrition: Goal: Adequate nutrition will be maintained Outcome: Progressing   Problem: Activity: Goal: Risk for activity intolerance will decrease Outcome: Progressing   

## 2020-06-25 NOTE — Progress Notes (Addendum)
[facility]                                                                              Patient Demographics  David Short, is a 50 y.o. male, DOB - 1970/08/23, SWF:[id number]  Admit date - 06/17/2020   Admitting Physician Collier Bullock, MD  Outpatient Primary MD for the patient is Default, Provider, MD  Outpatient specialists:   LOS - 7  days   Medical records reviewed and are as summarized below:    Chief Complaint  Patient presents with  . Shortness of Breath       Brief summary   Patient is a 50 year old man with a history of vertebral osteomyelitis, anterior cervical discectomy,  cervical spinal fusion, Reported HX of HTN per chart, current smoker, chronic back pain, here with dyspnea for the past several weeks.  Mostly he notices dyspnea on exertion.  No dyspnea on talking.  Worsening dyspnea x 2 weeks. He also endorses orthopnea, but he is not sure of the duration (at least months).  Lower extremity edema to the knees for the past two weeks.  In the ED, patient was treated for COPD exacerbation, as well as volume overload due to CHF.Found to have a R sided pleural effusion, some atelectasis, pulm edema, ascites, anasarca.  Was given methylprednisolone, duonebs, lasix 31m IV, ceftriaxone and azithromycin.  Was placed on bipap due to worsening hypoxemia despite NRB, increasing work of breaking.  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 -Negative balance of 19.5 L, we met with slightly trending up creatinine 1.3, CO2 trended up to 41, will DC IV Lasix.  Acute COPD exacerbation -Wheezing much better, IV Solu-Medrol tapered off, continue prednisone. - Continue scheduled duo nebs, doxycycline, Brovana -Wean O2 as tolerated -PT OT evaluation, will need home O2 evaluation prior to discharge  Right-sided pleural effusion - will attempt thoracentesis if pleural effusion not improved, patient has been on increased diuresis with Lasix -Chest x-ray 4/8 showed small right pleural effusion, interval resolution of pulmonary edema -Improved, Lasix discontinued  Chronic cervical/back pain, -Continues to ask for increasing pain medications.  Difficult situation as patient also IV drug user with heroin, high pain tolerance, has prior back surgeries.   -Decrease OxyContin to 10 mg twice daily, decrease Dilaudid, continue oxycodone as needed for now.   - no escalation of pain medications -Due to opioid dependency, if patient wants to be treated for his chronic pains, will need to seek help in intensive outpatient rehab and pain management clinic.  Likely IV heroin drug withdrawals -Completed clonidine withdrawal protocol.  Anemia of chronic disease, microcytic -transfused  1 unit packed RBCs on 4/3 H&H has remained stable  Anasarca, ascites, history of pulmonary hypertension, TR, suspected cirrhosis, hypoalbuminemia, thrombocytopenia -GI following CT abdomen showed interval decrease in the size of small right pleural effusion, stable splenomegaly?  Prominent retroperitoneal lymph nodes without definite lymphadenopathy, consider PET/CT -PET/CT will need to be done outpatient, PCP to follow  Code Status: Full CODE STATUS DVT Prophylaxis:  SCDs Start: 06/18/20 0051   Level of Care: Level of care: Telemetry Cardiac Family Communication: Discussed all imaging results, lab results, explained to the patient,   Disposition  Plan:     Status is: Inpatient  Remains inpatient appropriate because:Inpatient level of care appropriate due to severity of illness   Dispo: The patient is from: Home              Anticipated d/c is to: Home              Patient currently is not medically stable to d/c.  Still on 6 L O2, Lasix discontinued, wean O2 as tolerated, hopefully DC home in a.m. if stable   difficult to place patient No      Time Spent in minutes 35 minutes  Procedures:  None  Consultants:   Admitted by [facility] Palliative medicine Gastroenterology  Antimicrobials:   Anti-infectives (From admission, onward)   Start     Dose/Rate Route Frequency Ordered Stop   06/21/20 2000  ciprofloxacin (CIPRO) tablet 500 mg        500 mg Oral 2 times daily 06/21/20 1426 06/26/20 1959   06/20/20 1000  doxycycline (VIBRA-TABS) tablet 100 mg        100 mg Oral Every 12 hours 06/20/20 0841     06/17/20 2345  cefTRIAXone (ROCEPHIN) 1 g in sodium chloride 0.9 % 100 mL IVPB        1 g 200 mL/hr over 30 Minutes Intravenous  Once 06/17/20 2341 06/18/20 0119   06/17/20 2345  azithromycin (ZITHROMAX) 500 mg in sodium chloride 0.9 % 250 mL IVPB        500 mg 250 mL/hr over 60 Minutes Intravenous  Once 06/17/20 2341 06/18/20 0614         Medications  Scheduled Meds: . sodium chloride   Intravenous Once  . arformoterol  15 mcg Nebulization BID  . Chlorhexidine Gluconate Cloth  6 each Topical Daily  . ciprofloxacin  500 mg Oral BID  . doxycycline  100 mg Oral Q12H  . hydrocortisone  25 mg Rectal BID  . lidocaine  1 patch Transdermal Q24H  . nicotine  21 mg Transdermal Daily  . oxyCODONE  15 mg Oral Q12H  . pantoprazole  40 mg Oral Daily  . predniSONE  40 mg Oral QAC breakfast   Continuous Infusions: PRN Meds:.bismuth subsalicylate, HYDROmorphone (DILAUDID) injection, ipratropium-albuterol, ondansetron (ZOFRAN) IV, oxyCODONE, polyethylene glycol      Subjective:   David Short was seen and examined  today.  No acute complaints, appears comfortable, eating breakfast.  Overall improving.  No chest pain, nausea vomiting or diarrhea   Objective:   Vitals:   06/24/20 2017 06/25/20 0028 06/25/20 0348 06/25/20 0736  BP: 114/74  112/70   Pulse: (!) 107  (!) 107   Resp: 18  19   Temp: 97.6 F (36.4 C)  98.4 F (36.9 C)   TempSrc: Oral  Oral   SpO2: 96%  94% 93%  Weight:  62.2 kg    Height:        Intake/Output Summary (  Last 24 hours) at 06/25/2020 1055 Last data filed at 06/25/2020 0410 Gross per 24 hour  Intake 1240 ml  Output 2550 ml  Net -1310 ml     Wt Readings from Last 3 Encounters:  06/25/20 62.2 kg  12/30/18 71.2 kg  08/18/16 77.1 kg   Physical Exam  General: Alert and oriented x 3, NAD  Cardiovascular: S1 S2 clear, RRR. No pedal edema b/l  Respiratory: Diminished breath sounds at bases,  Gastrointestinal: Soft, nontender, nondistended, NBS  Ext: no pedal edema bilaterally  Neuro: no new deficits  Musculoskeletal: No cyanosis, clubbing  Skin: No rashes  Psych: Normal affect and demeanor, alert and oriented x3     Data Reviewed:  I have personally reviewed following labs and imaging studies  Micro Results Recent Results (from the past 240 hour(s))  Resp Panel by RT-PCR (Flu A&B, Covid) Nasopharyngeal Swab     Status: None   Collection Time: 06/17/20  9:49 PM   Specimen: Nasopharyngeal Swab; Nasopharyngeal(NP) swabs in vial transport medium  Result Value Ref Range Status   SARS Coronavirus 2 by RT PCR NEGATIVE NEGATIVE Final    Comment: (NOTE) SARS-CoV-2 target nucleic acids are NOT DETECTED.  The SARS-CoV-2 RNA is generally detectable in upper respiratory specimens during the acute phase of infection. The lowest concentration of SARS-CoV-2 viral copies this assay can detect is 138 copies/mL. A negative result does not preclude SARS-Cov-2 infection and should not be used as the sole basis for treatment or other patient management decisions. A  negative result may occur with  improper specimen collection/handling, submission of specimen other than nasopharyngeal swab, presence of viral mutation(s) within the areas targeted by this assay, and inadequate number of viral copies(<138 copies/mL). A negative result must be combined with clinical observations, patient history, and epidemiological information. The expected result is Negative.  Fact Sheet for Patients:  EntrepreneurPulse.com.au  Fact Sheet for Healthcare Providers:  IncredibleEmployment.be  This test is no t yet approved or cleared by the [place] FDA and  has been authorized for detection and/or diagnosis of SARS-CoV-2 by FDA under an Emergency Use Authorization (EUA). This EUA will remain  in effect (meaning this test can be used) for the duration of the COVID-19 declaration under Section 564(b)(1) of the Act, 21 U.S.C.section 360bbb-3(b)(1), unless the authorization is terminated  or revoked sooner.       Influenza A by PCR NEGATIVE NEGATIVE Final   Influenza B by PCR NEGATIVE NEGATIVE Final    Comment: (NOTE) The Xpert Xpress SARS-CoV-2/FLU/RSV plus assay is intended as an aid in the diagnosis of influenza from Nasopharyngeal swab specimens and should not be used as a sole basis for treatment. Nasal washings and aspirates are unacceptable for Xpert Xpress SARS-CoV-2/FLU/RSV testing.  Fact Sheet for Patients: EntrepreneurPulse.com.au  Fact Sheet for Healthcare Providers: IncredibleEmployment.be  This test is not yet approved or cleared by the [place] FDA and has been authorized for detection and/or diagnosis of SARS-CoV-2 by FDA under an Emergency Use Authorization (EUA). This EUA will remain in effect (meaning this test can be used) for the duration of the COVID-19 declaration under Section 564(b)(1) of the Act, 21 U.S.C. section 360bbb-3(b)(1), unless the authorization  is terminated or revoked.  Performed at [facility], [place] [address] [place], [place] [address]     Radiology Reports DG Chest 2 View  Result Date: 06/24/2020 CLINICAL DATA:  Right-sided pleural effusion EXAM: CHEST - 2 VIEW COMPARISON:  Prior chest x-ray 06/21/2020 FINDINGS: Small  right layering pleural effusion. Stable cardiomegaly. Chronic bronchitic changes, interstitial prominence and hyperinflation. Interval resolution of pulmonary vascular congestion and mild edema. No pneumothorax. No acute osseous abnormality. IMPRESSION: 1. Small right pleural effusion. 2. Interval resolution of pulmonary vascular congestion and mild interstitial edema. 3. Background pulmonary hyperinflation and chronic bronchitic changes suggest underlying COPD. Electronically Signed   By: Jacqulynn Cadet M.D.   On: 06/24/2020 07:28   CT Head Wo Contrast  Result Date: 06/17/2020 CLINICAL DATA:  Abnormal mental status, head trauma EXAM: CT HEAD WITHOUT CONTRAST TECHNIQUE: Contiguous axial images were obtained from the base of the skull through the vertex without intravenous contrast. COMPARISON:  MRI 12/13/2018 FINDINGS: Brain: No acute intracranial abnormality. Specifically, no hemorrhage, hydrocephalus, mass lesion, acute infarction, or significant intracranial injury. Vascular: No hyperdense vessel or unexpected calcification. Skull: No acute calvarial abnormality. Sinuses/Orbits: No acute findings. Mucosal thickening in the paranasal sinuses. Mastoid air cells clear. Other: None IMPRESSION: No acute intracranial abnormality. Electronically Signed   By: Rolm Baptise M.D.   On: 06/17/2020 22:48   CT Chest W Contrast  Result Date: 06/17/2020 CLINICAL DATA:  Shortness of breath for 2 weeks with altered mental status, swelling and edema in the upper and lower extremities, 50% on room air EXAM: CT CHEST, ABDOMEN, AND PELVIS WITH CONTRAST TECHNIQUE: Multidetector CT imaging of the chest, abdomen and pelvis was  performed following the standard protocol during bolus administration of intravenous contrast. CONTRAST:  165m OMNIPAQUE IOHEXOL 300 MG/ML  SOLN COMPARISON:  Radiograph 06/17/2020 renal ultrasound 03/17/2012, CT 03/01/2012 FINDINGS: CT CHEST FINDINGS Cardiovascular: Borderline cardiomegaly with trace pericardial effusion. Extensive three-vessel coronary artery atherosclerosis. The aortic root is suboptimally assessed given cardiac pulsation artifact. Atherosclerotic plaque within the normal caliber aorta. No acute luminal abnormality of the imaged aorta. No periaortic stranding or hemorrhage. Normal 3 vessel branching of the aortic arch. Proximal great vessels are mildly calcified but otherwise unremarkable. Central pulmonary arteries are enlarged. No large central filling defects are evident within the limitations of this non tailored examination of the pulmonary arteries. Mediastinum/Nodes: Low-attenuation, borderline enlarged mediastinal and hilar lymph nodes are present including a 12 mm paratracheal lymph node (4/23), and 11 mm AP window lymph node (4/21) and a 12 mm left hilar lymph node (6/111). No acute abnormality of the trachea or esophagus. Thyroid gland and thoracic inlet are unremarkable. Lungs/Pleura: Large right and small left pleural effusion with areas of fairly uniformly enhancing atelectatic passive and dependent atelectasis though scattered fluid-filled and thickened airways are noted particularly towards the lung bases and some underlying degree of aspiration is not excluded. No pneumothorax. No other focal airspace opacity. No concerning pulmonary nodules or masses. Musculoskeletal: No acute osseous abnormality or suspicious osseous lesion. CT ABDOMEN PELVIS FINDINGS Hepatobiliary: No concerning focal liver lesion. Diffuse periportal edema. Smooth liver surface contour. No significant gallbladder wall thickening. Pericholecystic fluid is likely related to redistributed ascites throughout the  abdomen. Pancreas: No pancreatic ductal dilatation or surrounding inflammatory changes. Spleen: Slightly heterogeneous enhancement of the spleen may be related to contrast timing. Splenomegaly is present. Measuring up to 18 cm in craniocaudal dimension. Normal splenic cleft. No concerning splenic lesion. Adrenals/Urinary Tract: Normal adrenal glands. Symmetrically delayed renal enhancement and excretion. No visible focal renal lesion. No urolithiasis or hydronephrosis. Urinary bladder is unremarkable for degree of distension. Stomach/Bowel: Stomach and duodenum unremarkable. Slightly edematous appearance of the distal small bowel may be related to presence of moderate volume ascites and presumed volume third-spacing. No colonic dilatation or wall thickening.  No evidence of bowel obstruction. The appendix is not well visualized. Vascular/Lymphatic: Atherosclerotic calcifications within the abdominal aorta and branch vessels. No aneurysm or ectasia. No enlarged abdominopelvic lymph nodes. Edematous appearing mesenteric nodes as well as some borderline enlarged though low-attenuation bilateral inguinal nodes. No other conspicuous enlarged abdominopelvic nodes. Reproductive: Coarse eccentric calcification of the prostate. No concerning abnormalities of the prostate or seminal vesicles. A markedly edematous changes of the soft tissues of the included internal genitalia. Other: Moderate volume ascites with additional edematous changes throughout the mesentery. Extensive circumferential body wall edema noted as well. Tiny fat containing umbilical hernia. No bowel containing hernias. Musculoskeletal: Bony fusion across the L1-2 L3 levels may reflect sequela of prior infection or injury. Given that this is a new finding since 2013. No other acute or conspicuous osseous abnormalities. Bones of the pelvis appear intact and congruent. IMPRESSION: 1. Extensive features of anasarca with circumferential body wall edema, pleural  and pericardial effusions, periportal edema, moderate volume ascites and edematous changes of the mesentery. 2. Uniformly enhancing atelectatic passive and dependent atelectasis adjacent the basilar effusions though scattered fluid-filled and thickened airways are noted particularly towards the lung bases and some underlying degree of aspiration is not excluded. 3. Low-attenuation, borderline enlarged mediastinal hilar, and abdominopelvic nodes are nonspecific but could be reactive or edematous given features elsewhere though follow-up imaging may be warranted given the presence of splenomegaly as well. 4. Delayed renal enhancement bilaterally, nonspecific though should correlate for clinical of diminished renal function. 5. Bony fusion across the L1-2 L3 levels may reflect sequela of prior infection or injury. New finding since 2013. 6. Aortic Atherosclerosis (ICD10-I70.0). 7. Coronary artery calcifications are present. Please note that the presence of coronary artery calcium documents the presence of coronary artery disease, the severity of this disease and any potential stenosis cannot be assessed on this non-gated CT examination. Electronically Signed   By: Lovena Le M.D.   On: 06/17/2020 23:07   CT ABDOMEN PELVIS W CONTRAST  Result Date: 06/24/2020 CLINICAL DATA:  Epigastric pain Generalized abdominal pain, reassess for ascites. Diffuse abdominal pain. 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 EXAM: CT ABDOMEN AND PELVIS WITH CONTRAST TECHNIQUE: Multidetector CT imaging of the abdomen and pelvis was performed using the standard protocol following bolus administration of intravenous contrast. CONTRAST:  11m OMNIPAQUE IOHEXOL 300 MG/ML  SOLN COMPARISON:  CT abdomen pelvis 06/17/2020, CT abdomen pelvis 03/01/2012 FINDINGS: Lower chest: Interval decrease in size of a small right pleural effusion. Bilateral lower lobe atelectasis versus scarring. Coronary artery calcifications. Hepatobiliary: No focal liver abnormality.  No gallstones, gallbladder wall thickening, or pericholecystic fluid. No biliary dilatation. Pancreas: No focal lesion. Normal pancreatic contour. No surrounding inflammatory changes. No main pancreatic ductal dilatation. Spleen: The spleen is enlarged measuring up to 18 cm (new from 2013). No focal splenic lesion. Adrenals/Urinary Tract: No adrenal nodule bilaterally. Bilateral kidneys enhance symmetrically. No hydronephrosis. No hydroureter. The urinary bladder is unremarkable. Stomach/Bowel: PO contrast reaches the cecum. Stomach is within normal limits. No evidence of bowel wall thickening or dilatation. Stool throughout the colon. The appendix not definitely identified. Vascular/Lymphatic: No abdominal aorta or iliac aneurysm. Mild atherosclerotic plaque of the aorta and its branches. Question prominent retroperitoneal lymph nodes without definite lymphadenopathy. No abdominal, pelvic, or inguinal lymphadenopathy. Reproductive: Prostate is unremarkable. Other: No intraperitoneal free fluid. No intraperitoneal free gas. No organized fluid collection. Musculoskeletal: No abdominal wall hernia or abnormality. No suspicious lytic or blastic osseous lesions. No acute displaced fracture. Multilevel degenerative changes of the spine. IMPRESSION:  1. Interval decrease in size of a small right pleural effusion. 2. Stable splenomegaly (18cm that is new from 2013). Question prominent retroperitoneal lymph nodes without definite lymphadenopathy. Findings suggestive of a lymphoproliferative disorder. Consider PET-CT for further evaluation. 3. Constipation. 4.  Aortic Atherosclerosis (ICD10-I70.0) - mild. Electronically Signed   By: Iven Finn M.D.   On: 06/24/2020 19:53   CT ABDOMEN PELVIS W CONTRAST  Result Date: 06/17/2020 CLINICAL DATA:  Shortness of breath for 2 weeks with altered mental status, swelling and edema in the upper and lower extremities, 50% on room air EXAM: CT CHEST, ABDOMEN, AND PELVIS WITH  CONTRAST TECHNIQUE: Multidetector CT imaging of the chest, abdomen and pelvis was performed following the standard protocol during bolus administration of intravenous contrast. CONTRAST:  127m OMNIPAQUE IOHEXOL 300 MG/ML  SOLN COMPARISON:  Radiograph 06/17/2020 renal ultrasound 03/17/2012, CT 03/01/2012 FINDINGS: CT CHEST FINDINGS Cardiovascular: Borderline cardiomegaly with trace pericardial effusion. Extensive three-vessel coronary artery atherosclerosis. The aortic root is suboptimally assessed given cardiac pulsation artifact. Atherosclerotic plaque within the normal caliber aorta. No acute luminal abnormality of the imaged aorta. No periaortic stranding or hemorrhage. Normal 3 vessel branching of the aortic arch. Proximal great vessels are mildly calcified but otherwise unremarkable. Central pulmonary arteries are enlarged. No large central filling defects are evident within the limitations of this non tailored examination of the pulmonary arteries. Mediastinum/Nodes: Low-attenuation, borderline enlarged mediastinal and hilar lymph nodes are present including a 12 mm paratracheal lymph node (4/23), and 11 mm AP window lymph node (4/21) and a 12 mm left hilar lymph node (6/111). No acute abnormality of the trachea or esophagus. Thyroid gland and thoracic inlet are unremarkable. Lungs/Pleura: Large right and small left pleural effusion with areas of fairly uniformly enhancing atelectatic passive and dependent atelectasis though scattered fluid-filled and thickened airways are noted particularly towards the lung bases and some underlying degree of aspiration is not excluded. No pneumothorax. No other focal airspace opacity. No concerning pulmonary nodules or masses. Musculoskeletal: No acute osseous abnormality or suspicious osseous lesion. CT ABDOMEN PELVIS FINDINGS Hepatobiliary: No concerning focal liver lesion. Diffuse periportal edema. Smooth liver surface contour. No significant gallbladder wall thickening.  Pericholecystic fluid is likely related to redistributed ascites throughout the abdomen. Pancreas: No pancreatic ductal dilatation or surrounding inflammatory changes. Spleen: Slightly heterogeneous enhancement of the spleen may be related to contrast timing. Splenomegaly is present. Measuring up to 18 cm in craniocaudal dimension. Normal splenic cleft. No concerning splenic lesion. Adrenals/Urinary Tract: Normal adrenal glands. Symmetrically delayed renal enhancement and excretion. No visible focal renal lesion. No urolithiasis or hydronephrosis. Urinary bladder is unremarkable for degree of distension. Stomach/Bowel: Stomach and duodenum unremarkable. Slightly edematous appearance of the distal small bowel may be related to presence of moderate volume ascites and presumed volume third-spacing. No colonic dilatation or wall thickening. No evidence of bowel obstruction. The appendix is not well visualized. Vascular/Lymphatic: Atherosclerotic calcifications within the abdominal aorta and branch vessels. No aneurysm or ectasia. No enlarged abdominopelvic lymph nodes. Edematous appearing mesenteric nodes as well as some borderline enlarged though low-attenuation bilateral inguinal nodes. No other conspicuous enlarged abdominopelvic nodes. Reproductive: Coarse eccentric calcification of the prostate. No concerning abnormalities of the prostate or seminal vesicles. A markedly edematous changes of the soft tissues of the included internal genitalia. Other: Moderate volume ascites with additional edematous changes throughout the mesentery. Extensive circumferential body wall edema noted as well. Tiny fat containing umbilical hernia. No bowel containing hernias. Musculoskeletal: Bony fusion across the L1-2 L3 levels may  reflect sequela of prior infection or injury. Given that this is a new finding since 2013. No other acute or conspicuous osseous abnormalities. Bones of the pelvis appear intact and congruent. IMPRESSION: 1.  Extensive features of anasarca with circumferential body wall edema, pleural and pericardial effusions, periportal edema, moderate volume ascites and edematous changes of the mesentery. 2. Uniformly enhancing atelectatic passive and dependent atelectasis adjacent the basilar effusions though scattered fluid-filled and thickened airways are noted particularly towards the lung bases and some underlying degree of aspiration is not excluded. 3. Low-attenuation, borderline enlarged mediastinal hilar, and abdominopelvic nodes are nonspecific but could be reactive or edematous given features elsewhere though follow-up imaging may be warranted given the presence of splenomegaly as well. 4. Delayed renal enhancement bilaterally, nonspecific though should correlate for clinical of diminished renal function. 5. Bony fusion across the L1-2 L3 levels may reflect sequela of prior infection or injury. New finding since 2013. 6. Aortic Atherosclerosis (ICD10-I70.0). 7. Coronary artery calcifications are present. Please note that the presence of coronary artery calcium documents the presence of coronary artery disease, the severity of this disease and any potential stenosis cannot be assessed on this non-gated CT examination. Electronically Signed   By: Lovena Le M.D.   On: 06/17/2020 23:07   DG Chest Port 1 View  Result Date: 06/21/2020 CLINICAL DATA:  Shortness of breath. EXAM: PORTABLE CHEST 1 VIEW COMPARISON:  Chest x-ray 06/20/2020.  Chest CT 06/17/2020. FINDINGS: Heart size upper normal. Vascular congestion again noted with diffuse interstitial opacity. Hazy opacity at the right base compatible with known pleural effusion seen on previous CT. The visualized bony structures of the thorax show no acute abnormality. Telemetry leads overlie the chest. IMPRESSION: No substantial change since chest x-ray from yesterday. Electronically Signed   By: Misty Stanley M.D.   On: 06/21/2020 13:30   DG Chest Port 1 View  Result  Date: 06/20/2020 CLINICAL DATA:  50 year old male with chest pain and shortness of breath. Smoker. EXAM: PORTABLE CHEST 1 VIEW COMPARISON:  CT Chest, Abdomen, and Pelvis 06/17/2020 and earlier. FINDINGS: Portable AP semi upright view at 0901 hours. Significantly regressed right pleural effusion. Small residual suspected. Decreased but not resolved bilateral perihilar interstitial opacity slightly greater on the right. Normal cardiac size and mediastinal contours. Visualized tracheal air column is within normal limits. No pneumothorax. No areas of worsening ventilation. Paucity of bowel gas in the upper abdomen. No acute osseous abnormality identified. Prior cervical ACDF. IMPRESSION: 1. Regressed but not resolved right pleural effusion and bilateral perihilar opacity, most resembling vascular congestion. 2. No new cardiopulmonary abnormality. Electronically Signed   By: Genevie Ann M.D.   On: 06/20/2020 09:16   DG Chest Portable 1 View  Result Date: 06/17/2020 CLINICAL DATA:  Shortness of breath EXAM: PORTABLE CHEST 1 VIEW COMPARISON:  12/12/2018 FINDINGS: Shallow inspiration. Small to moderate right pleural effusion with consolidation in the right lung base. Linear atelectasis or infiltration in the left base. Cardiac enlargement. No vascular congestion. Calcification of the aorta. IMPRESSION: Small to moderate right pleural effusion with consolidation in the right lung base. Linear atelectasis or infiltration in the left base. Electronically Signed   By: Lucienne Capers M.D.   On: 06/17/2020 20:18   ECHOCARDIOGRAM COMPLETE  Result Date: 06/18/2020    ECHOCARDIOGRAM REPORT   Patient Name:   Arlyn Dunning Date of Exam: 06/18/2020 Medical Rec #:  [id number]         Height:       72.0 in  Accession #:    [id number]        Weight:       166.0 lb Date of Birth:  Jun 06, 1970         BSA:          1.968 m Patient Age:    89 years          BP:           115/71 mmHg Patient Gender: M                 HR:           101 bpm.  Exam Location:  Inpatient Procedure: 2D Echo, Color Doppler and Cardiac Doppler Indications:    I51.7 Cardiomegaly; Acute Respiratory Distress R06.03  History:        Patient has no prior history of Echocardiogram examinations.  Sonographer:    Tiffany Dance Referring Phys: [id number] [place]igg[place]  1. Right ventricular systolic function is normal. The right ventricular size is severely enlarged. There is moderately elevated pulmonary artery systolic pressure. The estimated right ventricular systolic pressure is 83.3 mmHg. Right ventricular apex not well visualized. Coronary sinus dilation consistent with right ventricular overload.  2. Right atrial size was severely dilated.  3. A small pericardial effusion is present. The pericardial effusion is circumferential. There is no evidence of increased pericardial pressure.  4. Left ventricular ejection fraction, by estimation, is 65 to 70%. The left ventricle has normal function. The left ventricle has no regional wall motion abnormalities. There is mild concentric left ventricular hypertrophy. Left ventricular diastolic parameters were normal. There is the interventricular septum is flattened in systole, consistent with right ventricular pressure overload.  5. Tricuspid valve regurgitation is moderate and eccentric best seen in A4c view .  6. Left atrial size was mildly dilated.  7. The mitral valve is normal in structure. Trivial mitral valve regurgitation.  8. The aortic valve is tricuspid. Aortic valve regurgitation is not visualized. No aortic stenosis is present.  9. The inferior vena cava is dilated in size with <50% respiratory variability, suggesting right atrial pressure of 15 mmHg. Comparison(s): No prior Echocardiogram. FINDINGS  Left Ventricle: Left ventricular ejection fraction, by estimation, is 65 to 70%. The left ventricle has normal function. The left ventricle has no regional wall motion abnormalities. The left ventricular internal cavity  size was normal in size. There is  mild concentric left ventricular hypertrophy. The interventricular septum is flattened in systole, consistent with right ventricular pressure overload. Left ventricular diastolic parameters were normal. Right Ventricle: The right ventricular size is severely enlarged. No increase in right ventricular wall thickness. Right ventricular systolic function is normal. There is moderately elevated pulmonary artery systolic pressure. The tricuspid regurgitant velocity is 3.06 m/s, and with an assumed right atrial pressure of 15 mmHg, the estimated right ventricular systolic pressure is 82.5 mmHg. Left Atrium: Left atrial size was mildly dilated. Right Atrium: Right atrial size was severely dilated. Pericardium: A small pericardial effusion is present. The pericardial effusion is circumferential. There is no evidence of cardiac tamponade. Mitral Valve: The mitral valve is normal in structure. Trivial mitral valve regurgitation. Tricuspid Valve: The tricuspid valve is normal in structure. Tricuspid valve regurgitation is moderate . No evidence of tricuspid stenosis. Aortic Valve: The aortic valve is tricuspid. Aortic valve regurgitation is not visualized. No aortic stenosis is present. Pulmonic Valve: Discrepancy between Spectral Doppler and Color Doppler. The pulmonic valve was grossly normal. Pulmonic valve regurgitation is not visualized.  No evidence of pulmonic stenosis. Aorta: The aortic root and ascending aorta are structurally normal, with no evidence of dilitation. Venous: The inferior vena cava is dilated in size with less than 50% respiratory variability, suggesting right atrial pressure of 15 mmHg. IAS/Shunts: The atrial septum is grossly normal.  LEFT VENTRICLE PLAX 2D LVIDd:         4.80 cm  Diastology LVIDs:         2.30 cm  LV e' medial:    11.00 cm/s LV PW:         1.30 cm  LV E/e' medial:  8.5 LV IVS:        1.10 cm  LV e' lateral:   10.30 cm/s LVOT diam:     2.00 cm  LV  E/e' lateral: 9.0 LV SV:         75 LV SV Index:   38 LVOT Area:     3.14 cm  RIGHT VENTRICLE             IVC RV Basal diam:  3.80 cm     IVC diam: 3.30 cm RV Mid diam:    2.80 cm RV S prime:     16.50 cm/s TAPSE (M-mode): 1.9 cm LEFT ATRIUM              Index       RIGHT ATRIUM           Index LA diam:        5.60 cm  2.84 cm/m  RA Area:     28.50 cm LA Vol (A2C):   122.0 ml 61.98 ml/m RA Volume:   111.00 ml 56.39 ml/m LA Vol (A4C):   46.7 ml  23.73 ml/m LA Biplane Vol: 75.8 ml  38.51 ml/m  AORTIC VALVE LVOT Vmax:   123.00 cm/s LVOT Vmean:  82.400 cm/s LVOT VTI:    0.239 m  AORTA Ao Root diam: 3.60 cm Ao Asc diam:  3.30 cm MITRAL VALVE               TRICUSPID VALVE MV Area (PHT): 3.48 cm    TR Peak grad:   37.5 mmHg MV Decel Time: 218 msec    TR Vmax:        306.00 cm/s MV E velocity: 93.00 cm/s MV A velocity: 88.10 cm/s  SHUNTS MV E/A ratio:  1.06        Systemic VTI:  0.24 m                            Systemic Diam: 2.00 cm Rudean Haskell MD Electronically signed by Rudean Haskell MD Signature Date/Time: 06/18/2020/4:36:14 PM    Final     Lab Data:  CBC: Recent Labs  Lab 06/21/20 0416 06/21/20 1323 06/22/20 0407 06/23/20 0438 06/24/20 0141 06/25/20 0318  WBC 5.0  --  6.4 9.0 8.1 8.3  HGB 6.8* 8.1* 8.3* 9.6* 8.7* 8.7*  HCT 27.3* 31.8* 31.0* 36.6* 33.6* 34.0*  MCV 76.0*  --  76.5* 76.4* 77.1* 78.0*  PLT 75*  --  69* 74* 67* 67*   Basic Metabolic Panel: Recent Labs  Lab 06/21/20 0416 06/22/20 0407 06/23/20 0438 06/24/20 0141 06/25/20 0318  NA 135 138 136 136 139  K 3.9 4.0 3.6 3.7 3.8  CL 90* 89* 88* 88* 89*  CO2 37* 40* 39* 38* 41*  GLUCOSE 170* 134* 215* 263* 152*  BUN 38* 46* 59* 63*  61*  CREATININE 1.02 0.98 1.10 1.16 1.34*  CALCIUM 8.1* 8.4* 8.1* 8.2* 8.7*   GFR: Estimated Creatinine Clearance: 58 mL/min (A) (by C-G formula based on SCr of 1.34 mg/dL (H)). Liver Function Tests: Recent Labs  Lab 06/25/20 0318  AST 23  ALT 20  ALKPHOS 80  BILITOT 0.9   PROT 6.5  ALBUMIN 2.4*   No results for input(s): LIPASE, AMYLASE in the last 168 hours. No results for input(s): AMMONIA in the last 168 hours. Coagulation Profile: Recent Labs  Lab 06/22/20 0407 06/23/20 0438 06/25/20 0318  INR 1.5* 1.6* 1.4*   Cardiac Enzymes: No results for input(s): CKTOTAL, CKMB, CKMBINDEX, TROPONINI in the last 168 hours. BNP (last 3 results) No results for input(s): PROBNP in the last 8760 hours. HbA1C: Recent Labs    06/23/20 0627  HGBA1C 5.6   CBG: No results for input(s): GLUCAP in the last 168 hours. Lipid Profile: No results for input(s): CHOL, HDL, LDLCALC, TRIG, CHOLHDL, LDLDIRECT in the last 72 hours. Thyroid Function Tests: No results for input(s): TSH, T4TOTAL, FREET4, T3FREE, THYROIDAB in the last 72 hours. Anemia Panel: No results for input(s): VITAMINB12, FOLATE, FERRITIN, TIBC, IRON, RETICCTPCT in the last 72 hours. Urine analysis:    Component Value Date/Time   COLORURINE YELLOW 06/17/2020 2340   APPEARANCEUR HAZY (A) 06/17/2020 2340   LABSPEC 1.010 06/17/2020 2340   PHURINE 5.0 06/17/2020 2340   GLUCOSEU NEGATIVE 06/17/2020 2340   HGBUR LARGE (A) 06/17/2020 2340   BILIRUBINUR NEGATIVE 06/17/2020 2340   KETONESUR NEGATIVE 06/17/2020 2340   PROTEINUR 30 (A) 06/17/2020 2340   UROBILINOGEN 1.0 03/17/2012 0047   NITRITE NEGATIVE 06/17/2020 2340   LEUKOCYTESUR NEGATIVE 06/17/2020 2340     Hazyl Marseille M.D. [facility] 06/25/2020, 10:55 AM  Available via Epic secure chat 7am-7pm After 7 pm, please refer to night coverage provider listed on amion.

## 2020-06-26 LAB — BASIC METABOLIC PANEL
Anion gap: 8 (ref 5–15)
BUN: 48 mg/dL — ABNORMAL HIGH (ref 6–20)
CO2: 31 mmol/L (ref 22–32)
Calcium: 8.5 mg/dL — ABNORMAL LOW (ref 8.9–10.3)
Chloride: 99 mmol/L (ref 98–111)
Creatinine, Ser: 1 mg/dL (ref 0.61–1.24)
GFR, Estimated: >60 mL/min (ref >60)
Glucose, Bld: 219 mg/dL — ABNORMAL HIGH (ref 70–99)
Potassium: 4.3 mmol/L (ref 3.5–5.1)
Sodium: 138 mmol/L (ref 135–145)

## 2020-06-26 LAB — CBC
HCT: 34.3 % — ABNORMAL LOW (ref 39.0–52.0)
Hemoglobin: 8.5 g/dL — ABNORMAL LOW (ref 13.0–17.0)
MCH: 19.7 pg — ABNORMAL LOW (ref 26.0–34.0)
MCHC: 24.8 g/dL — ABNORMAL LOW (ref 30.0–36.0)
MCV: 79.6 fL — ABNORMAL LOW (ref 80.0–100.0)
Platelets: 73 10*3/uL — ABNORMAL LOW (ref 150–400)
RBC: 4.31 MIL/uL (ref 4.22–5.81)
RDW: 24 % — ABNORMAL HIGH (ref 11.5–15.5)
WBC: 7.5 10*3/uL (ref 4.0–10.5)
nRBC: 0 % (ref 0.0–0.2)

## 2020-06-26 LAB — GLUCOSE, CAPILLARY
Glucose-Capillary: 83 mg/dL (ref 70–99)
Glucose-Capillary: 281 mg/dL — ABNORMAL HIGH (ref 70–99)

## 2020-06-26 LAB — HEMOGLOBIN A1C
Hgb A1c MFr Bld: 5.6 % (ref 4.8–5.6)
Mean Plasma Glucose: 114.02 mg/dL

## 2020-06-26 MED ORDER — CARVEDILOL 3.125 MG PO TABS: 3.1250 mg | ORAL_TABLET | Freq: Two times a day (BID) | ORAL | 3 refills | Status: DC

## 2020-06-26 MED ORDER — ALBUTEROL SULFATE HFA 108 (90 BASE) MCG/ACT IN AERS: 2.0000 | INHALATION_SPRAY | Freq: Four times a day (QID) | RESPIRATORY_TRACT | 2 refills | Status: DC | PRN

## 2020-06-26 MED ORDER — INSULIN ASPART 100 UNIT/ML ~~LOC~~ SOLN
0.0000 [IU] | Freq: Three times a day (TID) | SUBCUTANEOUS | Status: DC
  Administered 2020-06-26: 5 [IU] via SUBCUTANEOUS

## 2020-06-26 MED ORDER — OXYCODONE HCL ER 10 MG PO T12A: 10.0000 mg | EXTENDED_RELEASE_TABLET | Freq: Two times a day (BID) | ORAL | 0 refills | Status: DC

## 2020-06-26 MED ORDER — OXYCODONE HCL 10 MG PO TABS: 10.0000 mg | ORAL_TABLET | Freq: Four times a day (QID) | ORAL | 0 refills | Status: DC | PRN

## 2020-06-26 MED ORDER — FLUTICASONE FUROATE-VILANTEROL 200-25 MCG/INH IN AEPB: 1.0000 | INHALATION_SPRAY | Freq: Every day | RESPIRATORY_TRACT | 2 refills | Status: DC

## 2020-06-26 MED ORDER — FUROSEMIDE 20 MG PO TABS: 20.0000 mg | ORAL_TABLET | Freq: Every day | ORAL | 0 refills | Status: DC

## 2020-06-26 MED ORDER — CARVEDILOL 3.125 MG PO TABS: 3.1250 mg | ORAL_TABLET | Freq: Two times a day (BID) | ORAL | Status: DC

## 2020-06-26 MED ORDER — PANTOPRAZOLE SODIUM 40 MG PO TBEC: 40.0000 mg | DELAYED_RELEASE_TABLET | Freq: Every day | ORAL | 1 refills | Status: AC

## 2020-06-26 MED ORDER — PREDNISONE 10 MG PO TABS: ORAL_TABLET | ORAL | 0 refills | Status: DC

## 2020-06-26 MED ORDER — HYDROCORTISONE ACETATE 25 MG RE SUPP: 25.0000 mg | Freq: Two times a day (BID) | RECTAL | 0 refills | Status: DC

## 2020-06-26 NOTE — Discharge Summary (Addendum)
Physician Discharge Summary   Patient ID: David Short MRN: [id number] DOB/AGE: Dec 06, 1970 50 y.o.  Admit date: 06/17/2020 Discharge date: 06/26/2020  Primary Care Physician: No prior PCP, appointment [facility] with [facility]   Recommendations for Outpatient Follow-up:  1. Follow up with PCP in 1-2 weeks 2. Please follow BMET, CBC at the time of the appointment 3. Please check PET/CT for prominent retroperitoneal lymph nodes without definite lymphadenopathy to rule out any lymphoproliferative disorder. 4. Ambulatory referral to hematology also sent to evaluate for any hematological disorder for splenomegaly, anemia with thrombocytopenia.  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 Tylenol [Acetaminophen] Hives     DISCHARGE MEDICATIONS: Allergies as of 06/26/2020      Reactions   Tylenol [acetaminophen] Hives      Medication List    TAKE these medications   albuterol 108 (90 Base) MCG/ACT inhaler Commonly known as: VENTOLIN HFA Inhale 2 puffs into the lungs every 6 (six) hours as needed for wheezing or shortness of breath.   carvedilol 3.125 MG tablet Commonly known as: COREG Take 1 tablet (3.125 mg total) by mouth 2 (two)  times daily with a meal.   furosemide 20 MG tablet Commonly known as: Lasix Take 1 tablet (20 mg total) by mouth daily.   hydrocortisone 25 MG suppository Commonly known as: ANUSOL-HC Place 1 suppository (25 mg total) rectally 2 (two) times daily. For hemorrhoids   Oxycodone HCl 10 MG Tabs Take 1 tablet (10 mg total) by mouth every 6 (six) hours as needed for moderate pain.   pantoprazole 40 MG tablet Commonly known as: PROTONIX Take 1 tablet (40 mg total) by mouth daily.   predniSONE 10 MG tablet Commonly known as: DELTASONE Prednisone dosing: Take  Prednisone 40mg  (4 tabs) x 1days, then taper to 30mg  (3 tabs) x 3 days, then 20mg  (2 tabs) x 3days, then 10mg  (1 tab) x 3days, then OFF. Start taking on: June 27, 2020            Durable Medical Equipment  (From admission, onward)         Start     Ordered   06/26/20 1337  For home use only DME oxygen  Once       Question Answer Comment  Length of Need 12 Months   Mode or (Route) Nasal cannula   Liters per Minute 3.5   Frequency Continuous (stationary and portable oxygen unit needed)   Oxygen conserving device Yes   Oxygen delivery system Gas      06/26/20 1336           Brief H and P: For complete details please refer to admission H and P, but in brief Patient is a 50 year old man with a history of vertebral osteomyelitis, anterior cervical discectomy, cervical spinal  fusion, Reported HX of HTN per chart, current smoker, chronic back pain, here with dyspnea for the past several weeks. Mostly he notices dyspnea on exertion. No dyspnea on talking. Worsening dyspnea x 2 weeks. He also endorses orthopnea, but he is not sure of the duration (at least months). Lower extremity edema to the knees for the past two weeks.  In the ED, patient was treated for COPD exacerbation, as well as volume overload due to CHF.Found to have a R sided pleural effusion, some atelectasis, pulm edema, ascites, anasarca. Was given  methylprednisolone, duonebs, lasix  IV, ceftriaxone and azithromycin.  Was placed on bipap due to worsening hypoxemia despite NRB, increasing work of breaking.  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 -Negative balance of 20 L, creatinine had trended up to 1.3 on 4/9, hence Lasix was discontinued -Creatinine now improved to 1.0, patient will continue Lasix outpatient 20 mg daily, outpatient follow-up with community [facility] scheduled.  Please follow BMET -Patient wants to be discharged, home O2 evaluation was done, qualifies for 2 L O2 via [place]   Acute COPD exacerbation -Wheezing improved, IV Solu-Medrol tapered off.  Continue prednisone taper -Patient has completed 7 days of doxycycline -Continue albuterol inhaler as needed,  Right-sided pleural effusion - -Chest x-ray 4/8 showed small right pleural effusion, interval resolution of pulmonary edema -Improved,  C IV Lasix was discontinued.hronic cervical/back pain, -Continues to ask for increasing pain medications.  Difficult situation as patient also IV drug user with heroin, high pain tolerance, has prior back surgeries.    Also appears to have pain medication seeking behavior -Prescribed 3 days of oral oxycodone every 6 hours as needed to avoid withdrawals.  Strongly recommended to follow-up outpatient with pain  clinic or PCP -Due to opioid dependency, if patient wants to be treated for his chronic pains, will need to seek help in intensive outpatient rehab and pain management clinic.  Likely IV heroin drug withdrawals -Patient has completed clonidine withdrawal protocol.  Anemia of chronic disease, microcytic -transfused 1 unit packed RBCs on 4/3 H&H remained stable, 8.5 at the time of discharge  Anasarca, ascites, history of pulmonary hypertension, TR, suspected cirrhosis, hypoalbuminemia, thrombocytopenia CT abdomen showed interval decrease in the size of small right pleural effusion, stable splenomegaly?  Prominent retroperitoneal lymph nodes without definite lymphadenopathy, consider PET/CT -PET/CT will need to be done outpatient, PCP to follow Ambulatory referral sent to hematology for rule out any hematological disorder for splenomegaly, anemia and thrombocytopenia. -Patient was followed by gastroenterology, recommended endoscopy to work-up the anemia, however not urgent.  Patient does not want to have any further work-up inpatient, requested to be discharged home.  He was recommended to follow-up with GI.   Day of Discharge S: Feeling better, wants to go home.  No fevers or chills, shortness of breath is improving.  BP 119/81 (BP Location: Left Arm)   Pulse (!) 108  Temp 97.8 F (36.6 C) (Oral)   Resp 18   Ht 6' (1.829 m)   Wt 62.2 kg   SpO2 95%   BMI 18.60 kg/m   Physical Exam: General: Alert and awake oriented x3 not in any acute distress. CVS: S1-S2 clear no murmur rubs or gallops Chest: Fairly CTA B Abdomen: soft mild TTP,  nondistended, normal bowel sounds Extremities: no cyanosis, clubbing or edema noted bilaterally Neuro: no new FND's   Get Medicines reviewed and adjusted: Please take all your medications with you for your next visit with your Primary MD  Please request your Primary MD to go over all hospital tests and procedure/radiological results at the follow  up. Please ask your Primary MD to get all Hospital records sent to his/her office.  If you experience worsening of your admission symptoms, develop shortness of breath, life threatening emergency, suicidal or homicidal thoughts you must seek medical attention immediately by calling 911 or calling your MD immediately  if symptoms less severe.  You must read complete instructions/literature along with all the possible adverse reactions/side effects for all the Medicines you take and that have been prescribed to you. Take any new Medicines after you have completely understood and accept all the possible adverse reactions/side effects.   Do not drive when taking pain medications.   Do not take more than prescribed Pain, Sleep and Anxiety Medications  Special Instructions: If you have smoked or chewed Tobacco  in the last 2 yrs please stop smoking, stop any regular Alcohol  and or any Recreational drug use.  Wear Seat belts while driving.  Please note  You were cared for by a hospitalist during your hospital stay. Once you are discharged, your primary care physician will handle any further medical issues. Please note that NO REFILLS for any discharge medications will be authorized once you are discharged, as it is imperative that you return to your primary care physician (or establish a relationship with a primary care physician if you do not have one) for your aftercare needs so that they can reassess your need for medications and monitor your lab values.   The results of significant diagnostics from this hospitalization (including imaging, microbiology, ancillary and laboratory) are listed below for reference.      Procedures/Studies:  DG Chest 2 View  Result Date: 06/24/2020 CLINICAL DATA:  Right-sided pleural effusion EXAM: CHEST - 2 VIEW COMPARISON:  Prior chest x-ray 06/21/2020 FINDINGS: Small right layering pleural effusion. Stable cardiomegaly. Chronic bronchitic changes, interstitial  prominence and hyperinflation. Interval resolution of pulmonary vascular congestion and mild edema. No pneumothorax. No acute osseous abnormality. IMPRESSION: 1. Small right pleural effusion. 2. Interval resolution of pulmonary vascular congestion and mild interstitial edema. 3. Background pulmonary hyperinflation and chronic bronchitic changes suggest underlying COPD. Electronically Signed   By: Malachy Moan M.D.   On: 06/24/2020 07:28   CT Head Wo Contrast  Result Date: 06/17/2020 CLINICAL DATA:  Abnormal mental status, head trauma EXAM: CT HEAD WITHOUT CONTRAST TECHNIQUE: Contiguous axial images were obtained from the base of the skull through the vertex without intravenous contrast. COMPARISON:  MRI 12/13/2018 FINDINGS: Brain: No acute intracranial abnormality. Specifically, no hemorrhage, hydrocephalus, mass lesion, acute infarction, or significant intracranial injury. Vascular: No hyperdense vessel or unexpected calcification. Skull: No acute calvarial abnormality. Sinuses/Orbits: No acute findings. Mucosal thickening in the paranasal sinuses. Mastoid air cells clear. Other: None IMPRESSION: No acute intracranial abnormality. Electronically Signed   By: Charlett Nose M.D.   On: 06/17/2020  22:48   CT Chest W Contrast  Result Date: 06/17/2020 CLINICAL DATA:  Shortness of breath for 2 weeks with altered mental status, swelling and edema in the upper and lower extremities, 50% on room air EXAM: CT CHEST, ABDOMEN, AND PELVIS WITH CONTRAST TECHNIQUE: Multidetector CT imaging of the chest, abdomen and pelvis was performed following the standard protocol during bolus administration of intravenous contrast. CONTRAST:  OMNIPAQUE IOHEXOL 300 MG/ML  SOLN COMPARISON:  Radiograph 06/17/2020 renal ultrasound 03/17/2012, CT 03/01/2012 FINDINGS: CT CHEST FINDINGS Cardiovascular: Borderline cardiomegaly with trace pericardial effusion. Extensive three-vessel coronary artery atherosclerosis. The aortic root is  suboptimally assessed given cardiac pulsation artifact. Atherosclerotic plaque within the normal caliber aorta. No acute luminal abnormality of the imaged aorta. No periaortic stranding or hemorrhage. Normal 3 vessel branching of the aortic arch. Proximal great vessels are mildly calcified but otherwise unremarkable. Central pulmonary arteries are enlarged. No large central filling defects are evident within the limitations of this non tailored examination of the pulmonary arteries. Mediastinum/Nodes: Low-attenuation, borderline enlarged mediastinal and hilar lymph nodes are present including a 12 mm paratracheal lymph node (4/23), and 11 mm AP window lymph node (4/21) and a 12 mm left hilar lymph node (6/111). No acute abnormality of the trachea or esophagus. Thyroid gland and thoracic inlet are unremarkable. Lungs/Pleura: Large right and small left pleural effusion with areas of fairly uniformly enhancing atelectatic passive and dependent atelectasis though scattered fluid-filled and thickened airways are noted particularly towards the lung bases and some underlying degree of aspiration is not excluded. No pneumothorax. No other focal airspace opacity. No concerning pulmonary nodules or masses. Musculoskeletal: No acute osseous abnormality or suspicious osseous lesion. CT ABDOMEN PELVIS FINDINGS Hepatobiliary: No concerning focal liver lesion. Diffuse periportal edema. Smooth liver surface contour. No significant gallbladder wall thickening. Pericholecystic fluid is likely related to redistributed ascites throughout the abdomen. Pancreas: No pancreatic ductal dilatation or surrounding inflammatory changes. Spleen: Slightly heterogeneous enhancement of the spleen may be related to contrast timing. Splenomegaly is present. Measuring up to 18 cm in craniocaudal dimension. Normal splenic cleft. No concerning splenic lesion. Adrenals/Urinary Tract: Normal adrenal glands. Symmetrically delayed renal enhancement and  excretion. No visible focal renal lesion. No urolithiasis or hydronephrosis. Urinary bladder is unremarkable for degree of distension. Stomach/Bowel: Stomach and duodenum unremarkable. Slightly edematous appearance of the distal small bowel may be related to presence of moderate volume ascites and presumed volume third-spacing. No colonic dilatation or wall thickening. No evidence of bowel obstruction. The appendix is not well visualized. Vascular/Lymphatic: Atherosclerotic calcifications within the abdominal aorta and branch vessels. No aneurysm or ectasia. No enlarged abdominopelvic lymph nodes. Edematous appearing mesenteric nodes as well as some borderline enlarged though low-attenuation bilateral inguinal nodes. No other conspicuous enlarged abdominopelvic nodes. Reproductive: Coarse eccentric calcification of the prostate. No concerning abnormalities of the prostate or seminal vesicles. A markedly edematous changes of the soft tissues of the included internal genitalia. Other: Moderate volume ascites with additional edematous changes throughout the mesentery. Extensive circumferential body wall edema noted as well. Tiny fat containing umbilical hernia. No bowel containing hernias. Musculoskeletal: Bony fusion across the L1-2 L3 levels may reflect sequela of prior infection or injury. Given that this is a new finding since 2013. No other acute or conspicuous osseous abnormalities. Bones of the pelvis appear intact and congruent. IMPRESSION: 1. Extensive features of anasarca with circumferential body wall edema, pleural and pericardial effusions, periportal edema, moderate volume ascites and edematous changes of the mesentery. 2. Uniformly enhancing atelectatic passive  and dependent atelectasis adjacent the basilar effusions though scattered fluid-filled and thickened airways are noted particularly towards the lung bases and some underlying degree of aspiration is not excluded. 3. Low-attenuation, borderline  enlarged mediastinal hilar, and abdominopelvic nodes are nonspecific but could be reactive or edematous given features elsewhere though follow-up imaging may be warranted given the presence of splenomegaly as well. 4. Delayed renal enhancement bilaterally, nonspecific though should correlate for clinical of diminished renal function. 5. Bony fusion across the L1-2 L3 levels may reflect sequela of prior infection or injury. New finding since 2013. 6. Aortic Atherosclerosis (ICD10-I70.0). 7. Coronary artery calcifications are present. Please note that the presence of coronary artery calcium documents the presence of coronary artery disease, the severity of this disease and any potential stenosis cannot be assessed on this non-gated CT examination. Electronically Signed   By: Kreg Shropshire M.D.   On: 06/17/2020 23:07   CT ABDOMEN PELVIS W CONTRAST  Result Date: 06/24/2020 CLINICAL DATA:  Epigastric pain Generalized abdominal pain, reassess for ascites. Diffuse abdominal pain. 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 EXAM: CT ABDOMEN AND PELVIS WITH CONTRAST TECHNIQUE: Multidetector CT imaging of the abdomen and pelvis was performed using the standard protocol following bolus administration of intravenous contrast. CONTRAST:  OMNIPAQUE IOHEXOL 300 MG/ML  SOLN COMPARISON:  CT abdomen pelvis 06/17/2020, CT abdomen pelvis 03/01/2012 FINDINGS: Lower chest: Interval decrease in size of a small right pleural effusion. Bilateral lower lobe atelectasis versus scarring. Coronary artery calcifications. Hepatobiliary: No focal liver abnormality. No gallstones, gallbladder wall thickening, or pericholecystic fluid. No biliary dilatation. Pancreas: No focal lesion. Normal pancreatic contour. No surrounding inflammatory changes. No main pancreatic ductal dilatation. Spleen: The spleen is enlarged measuring up to 18 cm (new from 2013). No focal splenic lesion. Adrenals/Urinary Tract: No adrenal nodule bilaterally. Bilateral kidneys  enhance symmetrically. No hydronephrosis. No hydroureter. The urinary bladder is unremarkable. Stomach/Bowel: PO contrast reaches the cecum. Stomach is within normal limits. No evidence of bowel wall thickening or dilatation. Stool throughout the colon. The appendix not definitely identified. Vascular/Lymphatic: No abdominal aorta or iliac aneurysm. Mild atherosclerotic plaque of the aorta and its branches. Question prominent retroperitoneal lymph nodes without definite lymphadenopathy. No abdominal, pelvic, or inguinal lymphadenopathy. Reproductive: Prostate is unremarkable. Other: No intraperitoneal free fluid. No intraperitoneal free gas. No organized fluid collection. Musculoskeletal: No abdominal wall hernia or abnormality. No suspicious lytic or blastic osseous lesions. No acute displaced fracture. Multilevel degenerative changes of the spine. IMPRESSION: 1. Interval decrease in size of a small right pleural effusion. 2. Stable splenomegaly (18cm that is new from 2013). Question prominent retroperitoneal lymph nodes without definite lymphadenopathy. Findings suggestive of a lymphoproliferative disorder. Consider PET-CT for further evaluation. 3. Constipation. 4.  Aortic Atherosclerosis (ICD10-I70.0) - mild. Electronically Signed   By: Tish Frederickson M.D.   On: 06/24/2020 19:53   CT ABDOMEN PELVIS W CONTRAST  Result Date: 06/17/2020 CLINICAL DATA:  Shortness of breath for 2 weeks with altered mental status, swelling and edema in the upper and lower extremities, 50% on room air EXAM: CT CHEST, ABDOMEN, AND PELVIS WITH CONTRAST TECHNIQUE: Multidetector CT imaging of the chest, abdomen and pelvis was performed following the standard protocol during bolus administration of intravenous contrast. CONTRAST:  OMNIPAQUE IOHEXOL 300 MG/ML  SOLN COMPARISON:  Radiograph 06/17/2020 renal ultrasound 03/17/2012, CT 03/01/2012 FINDINGS: CT CHEST FINDINGS Cardiovascular: Borderline cardiomegaly with trace pericardial  effusion. Extensive three-vessel coronary artery atherosclerosis. The aortic root is suboptimally assessed given cardiac pulsation artifact. Atherosclerotic plaque within the normal  caliber aorta. No acute luminal abnormality of the imaged aorta. No periaortic stranding or hemorrhage. Normal 3 vessel branching of the aortic arch. Proximal great vessels are mildly calcified but otherwise unremarkable. Central pulmonary arteries are enlarged. No large central filling defects are evident within the limitations of this non tailored examination of the pulmonary arteries. Mediastinum/Nodes: Low-attenuation, borderline enlarged mediastinal and hilar lymph nodes are present including a 12 mm paratracheal lymph node (4/23), and 11 mm AP window lymph node (4/21) and a 12 mm left hilar lymph node (6/111). No acute abnormality of the trachea or esophagus. Thyroid gland and thoracic inlet are unremarkable. Lungs/Pleura: Large right and small left pleural effusion with areas of fairly uniformly enhancing atelectatic passive and dependent atelectasis though scattered fluid-filled and thickened airways are noted particularly towards the lung bases and some underlying degree of aspiration is not excluded. No pneumothorax. No other focal airspace opacity. No concerning pulmonary nodules or masses. Musculoskeletal: No acute osseous abnormality or suspicious osseous lesion. CT ABDOMEN PELVIS FINDINGS Hepatobiliary: No concerning focal liver lesion. Diffuse periportal edema. Smooth liver surface contour. No significant gallbladder wall thickening. Pericholecystic fluid is likely related to redistributed ascites throughout the abdomen. Pancreas: No pancreatic ductal dilatation or surrounding inflammatory changes. Spleen: Slightly heterogeneous enhancement of the spleen may be related to contrast timing. Splenomegaly is present. Measuring up to 18 cm in craniocaudal dimension. Normal splenic cleft. No concerning splenic lesion.  Adrenals/Urinary Tract: Normal adrenal glands. Symmetrically delayed renal enhancement and excretion. No visible focal renal lesion. No urolithiasis or hydronephrosis. Urinary bladder is unremarkable for degree of distension. Stomach/Bowel: Stomach and duodenum unremarkable. Slightly edematous appearance of the distal small bowel may be related to presence of moderate volume ascites and presumed volume third-spacing. No colonic dilatation or wall thickening. No evidence of bowel obstruction. The appendix is not well visualized. Vascular/Lymphatic: Atherosclerotic calcifications within the abdominal aorta and branch vessels. No aneurysm or ectasia. No enlarged abdominopelvic lymph nodes. Edematous appearing mesenteric nodes as well as some borderline enlarged though low-attenuation bilateral inguinal nodes. No other conspicuous enlarged abdominopelvic nodes. Reproductive: Coarse eccentric calcification of the prostate. No concerning abnormalities of the prostate or seminal vesicles. A markedly edematous changes of the soft tissues of the included internal genitalia. Other: Moderate volume ascites with additional edematous changes throughout the mesentery. Extensive circumferential body wall edema noted as well. Tiny fat containing umbilical hernia. No bowel containing hernias. Musculoskeletal: Bony fusion across the L1-2 L3 levels may reflect sequela of prior infection or injury. Given that this is a new finding since 2013. No other acute or conspicuous osseous abnormalities. Bones of the pelvis appear intact and congruent. IMPRESSION: 1. Extensive features of anasarca with circumferential body wall edema, pleural and pericardial effusions, periportal edema, moderate volume ascites and edematous changes of the mesentery. 2. Uniformly enhancing atelectatic passive and dependent atelectasis adjacent the basilar effusions though scattered fluid-filled and thickened airways are noted particularly towards the lung bases  and some underlying degree of aspiration is not excluded. 3. Low-attenuation, borderline enlarged mediastinal hilar, and abdominopelvic nodes are nonspecific but could be reactive or edematous given features elsewhere though follow-up imaging may be warranted given the presence of splenomegaly as well. 4. Delayed renal enhancement bilaterally, nonspecific though should correlate for clinical of diminished renal function. 5. Bony fusion across the L1-2 L3 levels may reflect sequela of prior infection or injury. New finding since 2013. 6. Aortic Atherosclerosis (ICD10-I70.0). 7. Coronary artery calcifications are present. Please note that the presence of coronary artery calcium  documents the presence of coronary artery disease, the severity of this disease and any potential stenosis cannot be assessed on this non-gated CT examination. Electronically Signed   By: Kreg Shropshire M.D.   On: 06/17/2020 23:07   DG Chest Port 1 View  Result Date: 06/21/2020 CLINICAL DATA:  Shortness of breath. EXAM: PORTABLE CHEST 1 VIEW COMPARISON:  Chest x-ray 06/20/2020.  Chest CT 06/17/2020. FINDINGS: Heart size upper normal. Vascular congestion again noted with diffuse interstitial opacity. Hazy opacity at the right base compatible with known pleural effusion seen on previous CT. The visualized bony structures of the thorax show no acute abnormality. Telemetry leads overlie the chest. IMPRESSION: No substantial change since chest x-ray from yesterday. Electronically Signed   By: Kennith Center M.D.   On: 06/21/2020 13:30   DG Chest Port 1 View  Result Date: 06/20/2020 CLINICAL DATA:  50 year old male with chest pain and shortness of breath. Smoker. EXAM: PORTABLE CHEST 1 VIEW COMPARISON:  CT Chest, Abdomen, and Pelvis 06/17/2020 and earlier. FINDINGS: Portable AP semi upright view at 0901 hours. Significantly regressed right pleural effusion. Small residual suspected. Decreased but not resolved bilateral perihilar interstitial  opacity slightly greater on the right. Normal cardiac size and mediastinal contours. Visualized tracheal air column is within normal limits. No pneumothorax. No areas of worsening ventilation. Paucity of bowel gas in the upper abdomen. No acute osseous abnormality identified. Prior cervical ACDF. IMPRESSION: 1. Regressed but not resolved right pleural effusion and bilateral perihilar opacity, most resembling vascular congestion. 2. No new cardiopulmonary abnormality. Electronically Signed   By: Odessa Fleming M.D.   On: 06/20/2020 09:16   DG Chest Portable 1 View  Result Date: 06/17/2020 CLINICAL DATA:  Shortness of breath EXAM: PORTABLE CHEST 1 VIEW COMPARISON:  12/12/2018 FINDINGS: Shallow inspiration. Small to moderate right pleural effusion with consolidation in the right lung base. Linear atelectasis or infiltration in the left base. Cardiac enlargement. No vascular congestion. Calcification of the aorta. IMPRESSION: Small to moderate right pleural effusion with consolidation in the right lung base. Linear atelectasis or infiltration in the left base. Electronically Signed   By: Burman Nieves M.D.   On: 06/17/2020 20:18   ECHOCARDIOGRAM COMPLETE  Result Date: 06/18/2020    ECHOCARDIOGRAM REPORT   Patient Name:   Liliana Cline Date of Exam: 06/18/2020 Medical Rec #:  [id number]         Height:       72.0 in Accession #:    [id number]        Weight:       166.0 lb Date of Birth:  August 28, 1970         BSA:          1.968 m Patient Age:    49 years          BP:           115/71 mmHg Patient Gender: M                 HR:           101 bpm. Exam Location:  Inpatient Procedure: 2D Echo, Color Doppler and Cardiac Doppler Indications:    I51.7 Cardiomegaly; Acute Respiratory Distress R06.03  History:        Patient has no prior history of Echocardiogram examinations.  Sonographer:    Tiffany Dance Referring Phys: [id number] Charlotte Sanes IMPRESSIONS  1. Right ventricular systolic function is normal. The right  ventricular size is severely enlarged. There is moderately  elevated pulmonary artery systolic pressure. The estimated right ventricular systolic pressure is 52.5 mmHg. Right ventricular apex not well visualized. Coronary sinus dilation consistent with right ventricular overload.  2. Right atrial size was severely dilated.  3. A small pericardial effusion is present. The pericardial effusion is circumferential. There is no evidence of increased pericardial pressure.  4. Left ventricular ejection fraction, by estimation, is 65 to 70%. The left ventricle has normal function. The left ventricle has no regional wall motion abnormalities. There is mild concentric left ventricular hypertrophy. Left ventricular diastolic parameters were normal. There is the interventricular septum is flattened in systole, consistent with right ventricular pressure overload.  5. Tricuspid valve regurgitation is moderate and eccentric best seen in A4c view .  6. Left atrial size was mildly dilated.  7. The mitral valve is normal in structure. Trivial mitral valve regurgitation.  8. The aortic valve is tricuspid. Aortic valve regurgitation is not visualized. No aortic stenosis is present.  9. The inferior vena cava is dilated in size with <50% respiratory variability, suggesting right atrial pressure of 15 mmHg. Comparison(s): No prior Echocardiogram. FINDINGS  Left Ventricle: Left ventricular ejection fraction, by estimation, is 65 to 70%. The left ventricle has normal function. The left ventricle has no regional wall motion abnormalities. The left ventricular internal cavity size was normal in size. There is  mild concentric left ventricular hypertrophy. The interventricular septum is flattened in systole, consistent with right ventricular pressure overload. Left ventricular diastolic parameters were normal. Right Ventricle: The right ventricular size is severely enlarged. No increase in right ventricular wall thickness. Right ventricular  systolic function is normal. There is moderately elevated pulmonary artery systolic pressure. The tricuspid regurgitant velocity is 3.06 m/s, and with an assumed right atrial pressure of 15 mmHg, the estimated right ventricular systolic pressure is 52.5 mmHg. Left Atrium: Left atrial size was mildly dilated. Right Atrium: Right atrial size was severely dilated. Pericardium: A small pericardial effusion is present. The pericardial effusion is circumferential. There is no evidence of cardiac tamponade. Mitral Valve: The mitral valve is normal in structure. Trivial mitral valve regurgitation. Tricuspid Valve: The tricuspid valve is normal in structure. Tricuspid valve regurgitation is moderate . No evidence of tricuspid stenosis. Aortic Valve: The aortic valve is tricuspid. Aortic valve regurgitation is not visualized. No aortic stenosis is present. Pulmonic Valve: Discrepancy between Spectral Doppler and Color Doppler. The pulmonic valve was grossly normal. Pulmonic valve regurgitation is not visualized. No evidence of pulmonic stenosis. Aorta: The aortic root and ascending aorta are structurally normal, with no evidence of dilitation. Venous: The inferior vena cava is dilated in size with less than 50% respiratory variability, suggesting right atrial pressure of 15 mmHg. IAS/Shunts: The atrial septum is grossly normal.  LEFT VENTRICLE PLAX 2D LVIDd:         4.80 cm  Diastology LVIDs:         2.30 cm  LV e' medial:    11.00 cm/s LV PW:         1.30 cm  LV E/e' medial:  8.5 LV IVS:        1.10 cm  LV e' lateral:   10.30 cm/s LVOT diam:     2.00 cm  LV E/e' lateral: 9.0 LV SV:         75 LV SV Index:   38 LVOT Area:     3.14 cm  RIGHT VENTRICLE             IVC RV  Basal diam:  3.80 cm     IVC diam: 3.30 cm RV Mid diam:    2.80 cm RV S prime:     16.50 cm/s TAPSE (M-mode): 1.9 cm LEFT ATRIUM              Index       RIGHT ATRIUM           Index LA diam:        5.60 cm  2.84 cm/m  RA Area:     28.50 cm LA Vol (A2C):    122.0 ml 61.98 ml/m RA Volume:   111.00 ml 56.39 ml/m LA Vol (A4C):   46.7 ml  23.73 ml/m LA Biplane Vol: 75.8 ml  38.51 ml/m  AORTIC VALVE LVOT Vmax:   123.00 cm/s LVOT Vmean:  82.400 cm/s LVOT VTI:    0.239 m  AORTA Ao Root diam: 3.60 cm Ao Asc diam:  3.30 cm MITRAL VALVE               TRICUSPID VALVE MV Area (PHT): 3.48 cm    TR Peak grad:   37.5 mmHg MV Decel Time: 218 msec    TR Vmax:        306.00 cm/s MV E velocity: 93.00 cm/s MV A velocity: 88.10 cm/s  SHUNTS MV E/A ratio:  1.06        Systemic VTI:  0.24 m                            Systemic Diam: 2.00 cm Riley Lam MD Electronically signed by Riley Lam MD Signature Date/Time: 06/18/2020/4:36:14 PM    Final       LAB RESULTS: Basic Metabolic Panel: Recent Labs  Lab 06/25/20 0318 06/26/20 0150  NA 139 138  K 3.8 4.3  CL 89* 99  CO2 41* 31  GLUCOSE 152* 219*  BUN 61* 48*  CREATININE 1.34* 1.00  CALCIUM 8.7* 8.5*   Liver Function Tests: Recent Labs  Lab 06/25/20 0318  AST 23  ALT 20  ALKPHOS 80  BILITOT 0.9  PROT 6.5  ALBUMIN 2.4*   No results for input(s): LIPASE, AMYLASE in the last 168 hours. No results for input(s): AMMONIA in the last 168 hours. CBC: Recent Labs  Lab 06/25/20 0318 06/26/20 0150  WBC 8.3 7.5  HGB 8.7* 8.5*  HCT 34.0* 34.3*  MCV 78.0* 79.6*  PLT 67* 73*   Cardiac Enzymes: No results for input(s): CKTOTAL, CKMB, CKMBINDEX, TROPONINI in the last 168 hours. BNP: Invalid input(s): POCBNP CBG: Recent Labs  Lab 06/26/20 0619 06/26/20 1220  GLUCAP 83 281*       Disposition and Follow-up: Discharge Instructions    Ambulatory referral to Hematology / Oncology   Complete by: As directed    Diet - low sodium heart healthy   Complete by: As directed    No wound care   Complete by: As directed        DISPOSITION: Home   DISCHARGE FOLLOW-UP  Follow-up Information    [facility]. Go on 08/09/2020.   Why: @9 :30am Contact  information: [address] [place] [phone]       [phone], MD. Schedule an appointment as soon as possible for a visit in 2 week(s).   Specialty: Gastroenterology Contact information: [address] [place] [place] [phone]  [facility] Follow up.   Why: Oxygen to be delivered to the room prior to transition home.  Contact information: [address] [place] [address] [phone]                Time coordinating discharge:  35 minutes  Signed:   Thad Ranger M.D. [facility] 06/26/2020, 3:31 PM

## 2020-06-26 NOTE — Progress Notes (Signed)
SATURATION QUALIFICATIONS: (This note is used to comply with regulatory documentation for home oxygen)  Patient Saturations on Room Air at Rest = 86%  Patient Saturations on Room Air while Ambulating = 88%  Patient Saturations on 1 Liters of oxygen while Ambulating = 94%  Please briefly explain why patient needs home oxygen: desat on room air at rest and while ambulating, saturation on 2L oxygen at rest = 88%, saturation on 3L oxygen at rest = 89%, saturation on 3.5L oxygen at rest = 91%

## 2020-06-26 NOTE — Progress Notes (Signed)
Patient and his mother provided with discharge education and materials. Verbalized understanding. IV access and telemetry monitor removed, no issues noted. Patient discharged with all belongings.

## 2020-06-26 NOTE — Plan of Care (Signed)

## 2020-06-26 NOTE — Evaluation (Signed)
Physical Therapy Evaluation Patient Details Name: David Short MRN: [id number] DOB: [birth year] Today's Date: 06/26/2020   History of Present Illness  50 y.o. male presents to [facility] ED on 06/17/2020 with SOB, chest tightness, AMS, and cough. Pt found to have COPD exacerbation as well as volume overload.   Pt admitted to ICU, requiring BiPAP, transferred to floor on 4/4. Pt with abdominal pain, imaging negatvie. PMH includes vertebral osteomyelitis, anterior cervical discectomy,  cervical spinal fusion, IV drug use.  Clinical Impression  The pt presented with limitations in gait activities secondary to reports of fatigue and SOB. Pt's oxygen saturation appeared to have improved with ambulation and decreased during sitting. Pt demonstrates capacity for increased distances of mobility but is limited by SOB and overall deconditioning. Pt will benefit from continued acute PT to improve work of breathing and activity tolerance to improve functional independence in the home.   Home Oxygen Eval  Pt on room air sats 86% Pt on 2 L [place] at rest sats 88-92% Pt ambulates on room air with sats 89-91% Pt ambulates on 1L of oxygen, 94%     Follow Up Recommendations  (pt refusing home health PT)    Equipment Recommendations  None recommended by PT    Recommendations for Other Services       Precautions / Restrictions Precautions Precautions: Fall Restrictions Weight Bearing Restrictions: No      Mobility  Bed Mobility Overal bed mobility:  (not assessed, pt sitting at EOB upon arrival)                  Transfers Overall transfer level: Independent Equipment used: None                Ambulation/Gait Ambulation/Gait assistance: Supervision Gait Distance (Feet): 150 Feet (pt took a seated rest break and walked a short distance to assess oxygen sats when on room air remaining around 89% during ambulation.) Assistive device: None Gait Pattern/deviations: Step-through  pattern;Decreased step length - left;Trunk flexed Gait velocity: reduced Gait velocity interpretation: 1.31 - 2.62 ft/sec, indicative of limited community ambulator General Gait Details: Pt on supplemental oxygen during assessment of ambulatory sats, starting at 4 L, weaning to 3 L and then 1 L as oxygen sats were remaining in mid to high 90's. Pt then assessed on room air with gait, remaining at 88-89.  Stairs            Wheelchair Mobility    Modified Rankin (Stroke Patients Only)       Balance Overall balance assessment: Needs assistance Sitting-balance support: No upper extremity supported;Feet supported Sitting balance-Leahy Scale: Normal     Standing balance support: No upper extremity supported Standing balance-Leahy Scale: Good                               Pertinent Vitals/Pain Pain Assessment: Faces Faces Pain Scale: Hurts little more Pain Location: abdomen Pain Descriptors / Indicators: Grimacing Pain Intervention(s):  (premedicated by nurse)    Home Living Family/patient expects to be discharged to:: Private residence Living Arrangements: Other relatives Available Help at Discharge: Family (aunt available intermittently, cousin available 24/7) Type of Home: House Home Access: Stairs to enter Entrance Stairs-Rails: None Secretary/administrator of Steps: 3 Home Layout: One level Home Equipment: Cane - single point      Prior Function Level of Independence: Independent         Comments: limited distances d/t COPD  Hand Dominance        Extremity/Trunk Assessment   Upper Extremity Assessment Upper Extremity Assessment: Overall WFL for tasks assessed    Lower Extremity Assessment Lower Extremity Assessment: Generalized weakness    Cervical / Trunk Assessment Cervical / Trunk Assessment: Kyphotic  Communication   Communication: No difficulties  Cognition Arousal/Alertness: Awake/alert Behavior During Therapy: WFL for  tasks assessed/performed Overall Cognitive Status: Within Functional Limits for tasks assessed                                        General Comments General comments (skin integrity, edema, etc.): Nurse placed pt on 4L oxygen prior to start of session as oxygen sats were dropping to mid 80s when on room air. At rest after completion of walking, pt was still on room air and began to desat when sitting to mid 80s.    Exercises     Assessment/Plan    PT Assessment Patient needs continued PT services  PT Problem List Decreased strength;Decreased mobility;Cardiopulmonary status limiting activity       PT Treatment Interventions Gait training;Stair training;Functional mobility training;Therapeutic activities;Therapeutic exercise;Balance training;Neuromuscular re-education;Patient/family education    PT Goals (Current goals can be found in the Care Plan section)  Acute Rehab PT Goals Patient Stated Goal: to return home and improve breathing PT Goal Formulation: With patient Time For Goal Achievement: 07/10/20 Potential to Achieve Goals: Good    Frequency Min 3X/week   Barriers to discharge        Co-evaluation               AM-PAC PT "6 Clicks" Mobility  Outcome Measure Help needed turning from your back to your side while in a flat bed without using bedrails?: None Help needed moving from lying on your back to sitting on the side of a flat bed without using bedrails?: None Help needed moving to and from a bed to a chair (including a wheelchair)?: None Help needed standing up from a chair using your arms (e.g., wheelchair or bedside chair)?: None Help needed to walk in hospital room?: A Little Help needed climbing 3-5 steps with a railing? : A Little 6 Click Score: 22    End of Session Equipment Utilized During Treatment: Gait belt;Oxygen Activity Tolerance: Patient tolerated treatment well;Patient limited by fatigue Patient left: in bed;with call  bell/phone within reach Nurse Communication: Mobility status PT Visit Diagnosis: Other abnormalities of gait and mobility (R26.89);Difficulty in walking, not elsewhere classified (R26.2);History of falling (Z91.81);Muscle weakness (generalized) (M62.81)    Time: 2703-5009 PT Time Calculation (min) (ACUTE ONLY): 30 min   Charges:   PT Evaluation $PT Eval Low Complexity: 1 Low          Acute Rehab  Pager: [phone]   Waldemar Dickens, SPT 06/26/2020, 12:13 PM

## 2020-06-26 NOTE — Care Management (Addendum)
1311 06-26-20 Case Manager received notification from staff RN that patient needs oxygen for home. Patient is without insurance coverage. Case Manager called [employer] at 1300 for oxygen (charity). [employer] will call the patient to see if eligible for charity oxygen. Case Manager will continue to follow. Gala Lewandowsky, RN,BSN Case Manager     1519 06-26-20 MATCH completed for this patient. Copy of the MATCH letter to be provided to this patient. Case  Manager notified MD of medication costs. The [employer] is too expensive for the MATCH to cover and cannot exceed $500.00. Physician is aware and has made changes. Gala Lewandowsky, RN,BSN Case Manager

## 2020-06-27 ENCOUNTER — Telehealth: Payer: Self-pay | Admitting: Physician Assistant

## 2020-06-27 NOTE — Telephone Encounter (Signed)
Received a new hem referral from Dr. Isidoro Donning from the hospital for thrombocytopenia and splenomegaly. David Short has been cld and scheduled to see David Short on 4/12 at 1pm. Pt aware to arrive 30 minutes early.

## 2020-06-28 ENCOUNTER — Other Ambulatory Visit: Payer: Self-pay

## 2020-06-28 ENCOUNTER — Inpatient Hospital Stay: Payer: Medicaid Other

## 2020-06-28 ENCOUNTER — Inpatient Hospital Stay: Payer: Medicaid Other | Attending: Physician Assistant | Admitting: Physician Assistant

## 2020-06-28 ENCOUNTER — Encounter: Payer: Self-pay | Admitting: Physician Assistant

## 2020-06-28 VITALS — BP 113/75 | HR 102 | Temp 98.2°F | Resp 18 | Ht 72.0 in | Wt 136.4 lb

## 2020-06-28 DIAGNOSIS — Z9981 Dependence on supplemental oxygen: Secondary | ICD-10-CM

## 2020-06-28 DIAGNOSIS — D696 Thrombocytopenia, unspecified: Secondary | ICD-10-CM

## 2020-06-28 DIAGNOSIS — R590 Localized enlarged lymph nodes: Secondary | ICD-10-CM

## 2020-06-28 DIAGNOSIS — Z8249 Family history of ischemic heart disease and other diseases of the circulatory system: Secondary | ICD-10-CM | POA: Insufficient documentation

## 2020-06-28 DIAGNOSIS — Z79899 Other long term (current) drug therapy: Secondary | ICD-10-CM | POA: Insufficient documentation

## 2020-06-28 DIAGNOSIS — Z800 Family history of malignant neoplasm of digestive organs: Secondary | ICD-10-CM | POA: Diagnosis not present

## 2020-06-28 DIAGNOSIS — R161 Splenomegaly, not elsewhere classified: Secondary | ICD-10-CM | POA: Insufficient documentation

## 2020-06-28 DIAGNOSIS — R52 Pain, unspecified: Secondary | ICD-10-CM

## 2020-06-28 DIAGNOSIS — D509 Iron deficiency anemia, unspecified: Secondary | ICD-10-CM | POA: Diagnosis present

## 2020-06-28 DIAGNOSIS — D508 Other iron deficiency anemias: Secondary | ICD-10-CM

## 2020-06-28 DIAGNOSIS — F17210 Nicotine dependence, cigarettes, uncomplicated: Secondary | ICD-10-CM | POA: Insufficient documentation

## 2020-06-28 DIAGNOSIS — R591 Generalized enlarged lymph nodes: Secondary | ICD-10-CM

## 2020-06-28 DIAGNOSIS — Z8042 Family history of malignant neoplasm of prostate: Secondary | ICD-10-CM | POA: Diagnosis not present

## 2020-06-28 DIAGNOSIS — R1084 Generalized abdominal pain: Secondary | ICD-10-CM

## 2020-06-28 DIAGNOSIS — Z7952 Long term (current) use of systemic steroids: Secondary | ICD-10-CM | POA: Diagnosis not present

## 2020-06-28 DIAGNOSIS — R109 Unspecified abdominal pain: Secondary | ICD-10-CM | POA: Insufficient documentation

## 2020-06-28 LAB — CMP (CANCER CENTER ONLY)
ALT: 34 U/L (ref 0–44)
AST: 25 U/L (ref 15–41)
Albumin: 3.2 g/dL — ABNORMAL LOW (ref 3.5–5.0)
Alkaline Phosphatase: 86 U/L (ref 38–126)
Anion gap: 7 (ref 5–15)
BUN: 37 mg/dL — ABNORMAL HIGH (ref 6–20)
CO2: 34 mmol/L — ABNORMAL HIGH (ref 22–32)
Calcium: 8.8 mg/dL — ABNORMAL LOW (ref 8.9–10.3)
Chloride: 99 mmol/L (ref 98–111)
Creatinine: 0.74 mg/dL (ref 0.61–1.24)
GFR, Estimated: >60 mL/min (ref >60)
Glucose, Bld: 76 mg/dL (ref 70–99)
Potassium: 5.1 mmol/L (ref 3.5–5.1)
Sodium: 140 mmol/L (ref 135–145)
Total Bilirubin: 1 mg/dL (ref 0.3–1.2)
Total Protein: 7.6 g/dL (ref 6.5–8.1)

## 2020-06-28 LAB — CBC WITH DIFFERENTIAL (CANCER CENTER ONLY)
Abs Immature Granulocytes: 0.08 10*3/uL — ABNORMAL HIGH (ref 0.00–0.07)
Basophils Absolute: 0 10*3/uL (ref 0.0–0.1)
Basophils Relative: 0 %
Eosinophils Absolute: 0.1 10*3/uL (ref 0.0–0.5)
Eosinophils Relative: 1 %
HCT: 35.5 % — ABNORMAL LOW (ref 39.0–52.0)
Hemoglobin: 8.9 g/dL — ABNORMAL LOW (ref 13.0–17.0)
Immature Granulocytes: 1 %
Lymphocytes Relative: 14 %
Lymphs Abs: 1.2 10*3/uL (ref 0.7–4.0)
MCH: 19.8 pg — ABNORMAL LOW (ref 26.0–34.0)
MCHC: 25.1 g/dL — ABNORMAL LOW (ref 30.0–36.0)
MCV: 79.1 fL — ABNORMAL LOW (ref 80.0–100.0)
Monocytes Absolute: 0.8 10*3/uL (ref 0.1–1.0)
Monocytes Relative: 9 %
Neutro Abs: 6.7 10*3/uL (ref 1.7–7.7)
Neutrophils Relative %: 75 %
Platelet Count: 106 10*3/uL — ABNORMAL LOW (ref 150–400)
RBC: 4.49 MIL/uL (ref 4.22–5.81)
RDW: 25.1 % — ABNORMAL HIGH (ref 11.5–15.5)
WBC Count: 8.8 10*3/uL (ref 4.0–10.5)
nRBC: 0 % (ref 0.0–0.2)

## 2020-06-28 LAB — LACTATE DEHYDROGENASE: LDH: 178 U/L (ref 98–192)

## 2020-06-28 LAB — RETIC PANEL
Immature Retic Fract: 33.7 % — ABNORMAL HIGH (ref 2.3–15.9)
RBC.: 4.46 MIL/uL (ref 4.22–5.81)
Retic Count, Absolute: 72.3 10*3/uL (ref 19.0–186.0)
Retic Ct Pct: 1.6 % (ref 0.4–3.1)
Reticulocyte Hemoglobin: 29.8 pg (ref >27.9)

## 2020-06-28 LAB — HEPATITIS B CORE ANTIBODY, TOTAL: Hep B Core Total Ab: REACTIVE — AB

## 2020-06-28 LAB — HEPATITIS B SURFACE ANTIBODY,QUALITATIVE: Hep B S Ab: NONREACTIVE

## 2020-06-28 LAB — HEPATITIS C ANTIBODY: HCV Ab: REACTIVE — AB

## 2020-06-28 MED ORDER — OXYCODONE HCL 10 MG PO TABS: 10.0000 mg | ORAL_TABLET | ORAL | 0 refills | Status: AC

## 2020-06-28 NOTE — Progress Notes (Signed)
[facility] [phone]   [phone]  INITIAL CONSULT NOTE  Patient Care Team: Default, Provider, MD as PCP - General  Hematological/Oncological History 1) 06/17/2020-06/26/2020: Admitted for acute, possibly on chronic CHF RV failure, acute hypoxic and hypercapnic respiratory failure.  -Patient was found to be anemia with Hgb of 6.4 at admission. Patient was given 1 unit of pRBC on 06/19/2020. B12, folate and iron levels were checked that revealed iron deficiency anemia.  -CT CAP from 06/17/2020 revealed extensive features of anasarca with circumferential body wall edema, pleural and pericardial effusion, periportal edema, moderate volume ascites and edematous changes of the mesentery.  Borderline enlarged mediastinal hilar, abdominal pelvic nodes. Splenomegaly measuring up to 18 cm in size.  -Repeat CT CAP from 06/24/2020 revealed interval decrease in size of a small right pleural effusion.  Persistent splenomegaly.  Questionable prominent retroperitoneal lymph nodes without definitive lymphadenopathy.  2) 06/28/2020: Establish care with Georga KaufmannIrene Floy Angert PA-C   CHIEF COMPLAINTS/PURPOSE OF CONSULTATION:  "Thrombocytopenia and Splenomegaly "  HISTORY OF PRESENTING ILLNESS:  David Short 50 y.o. male with medical history significant for vertebral osteomyelitis, anterior cervical discectomy, cervical spinal fusion, IV heroin use and COPD. Patient is accompanied by his mother for this visit.   On exam today, David Short reports mild improvement of energy levels since hospital discharge on 06/26/20. He is trying to get out of bed and walk around. Patient does required 3 L of supplemental oxygen at all times. He has a good appetite without any noticeable weight changes. Patient denies any nausea or vomiting. Patient continues to have diffuse abdominal pain. He currently takes prescribed oxycodone 10 mg every 4 hours with some relief of pain. Patient denies IV drug use  since 06/17/2020. Patient reports daily bowel movements. He reports easy bruising in his upper and lower extremities. He denies any signs of bleeding including hematochezia, melena, hematuria, epistaxis or gum bleeding. Patient denies any fevers, chills, night sweats. He has a chronic cough with shortness of breath due to COPD. He has no other complaints. Rest of the 10 point ROS is below.   MEDICAL HISTORY:  Past Medical History:  Diagnosis Date  . Chronic back pain   . IVDU (intravenous drug user)    heroin   . Osteomyelitis (HCC)   . Tobacco use     SURGICAL HISTORY: Past Surgical History:  Procedure Laterality Date  . ANTERIOR CERVICAL DECOMP/DISCECTOMY FUSION N/A 12/14/2018   Procedure: ANTERIOR CERVICAL DISCECTOMY FUSION CERVICAL FIVE- CERVICAL SIX;  Surgeon: Tia AlertJones, David S, MD;  [facility];  Service: Neurosurgery;  Laterality: N/A;  . BACK SURGERY     states never had surgery  . FRACTURE SURGERY Left    elbow/wrist/ankle    SOCIAL HISTORY: Social History   Socioeconomic History  . Marital status: Single    Spouse name: Not on file  . Number of children: Not on file  . Years of education: Not on file  . Highest education level: Not on file  Occupational History  . Not on file  Tobacco Use  . Smoking status: Current Every Day Smoker    Packs/day: 1.00    Types: Cigarettes  . Smokeless tobacco: Never Used  Vaping Use  . Vaping Use: Never used  Substance and Sexual Activity  . Alcohol use: Not Currently  . Drug use: Not Currently    Types: IV, Heroin    Comment: Last use was 06/17/20.  Used to take 1/2 gm per day.   .Marland Kitchen  Sexual activity: Not on file  Other Topics Concern  . Not on file  Social History Narrative  . Not on file   Social Determinants of Health   Financial Resource Strain: Not on file  Food Insecurity: Not on file  Transportation Needs: Not on file  Physical Activity: Not on file  Stress: Not on file  Social Connections: Not on file  Intimate  Partner Violence: Not on file    FAMILY HISTORY: Family History  Problem Relation Age of Onset  . Hypertension Sister   . Hypertension Brother   . Colon cancer Maternal Grandmother   . Prostate cancer Maternal Grandfather   . Colon cancer Maternal Grandfather     ALLERGIES:  is allergic to tylenol [acetaminophen].  MEDICATIONS:  Current Outpatient Medications  Medication Sig Dispense Refill  . albuterol (VENTOLIN HFA) 108 (90 Base) MCG/ACT inhaler Inhale 2 puffs into the lungs every 6 (six) hours as needed for wheezing or shortness of breath. 8 g 2  . carvedilol (COREG) 3.125 MG tablet Take 1 tablet (3.125 mg total) by mouth 2 (two) times daily with a meal. 60 tablet 3  . furosemide (LASIX) 20 MG tablet Take 1 tablet (20 mg total) by mouth daily. 30 tablet 0  . hydrocortisone (ANUSOL-HC) 25 MG suppository Place 1 suppository (25 mg total) rectally 2 (two) times daily. For hemorrhoids 100 suppository 0  . pantoprazole (PROTONIX) 40 MG tablet Take 1 tablet (40 mg total) by mouth daily. 30 tablet 1  . predniSONE (DELTASONE) 10 MG tablet Prednisone dosing: Take  Prednisone  (4 tabs) x 1days, then taper to  (3 tabs) x 3 days, then  (2 tabs) x 3days, then  (1 tab) x 3days, then OFF. 22 tablet 0  . Oxycodone HCl 10 MG TABS Take 1 tablet (10 mg total) by mouth every 4 (four) hours for 7 days. 42 tablet 0   No current facility-administered medications for this visit.    REVIEW OF SYSTEMS:   Constitutional: ( - ) fevers, ( - )  chills , ( - ) night sweats Eyes: ( - ) blurriness of vision, ( - ) double vision, ( - ) watery eyes Ears, nose, mouth, throat, and face: ( - ) mucositis, ( - ) sore throat Respiratory: ( - ) cough, ( - ) dyspnea, ( - ) wheezes Cardiovascular: ( - ) palpitation, ( - ) chest discomfort, ( - ) lower extremity swelling Gastrointestinal:  ( - ) nausea, ( - ) heartburn, ( - ) change in bowel habits Skin: ( - ) abnormal skin rashes Lymphatics: ( - ) new  lymphadenopathy, ( + ) easy bruising Neurological: ( - ) numbness, ( - ) tingling, ( - ) new weaknesses Behavioral/Psych: ( - ) mood change, ( - ) new changes  All other systems were reviewed with the patient and are negative.  PHYSICAL EXAMINATION: ECOG PERFORMANCE STATUS: 2 - Symptomatic, <50% confined to bed  Vitals:   06/28/20 1328  BP: 113/75  Pulse: (!) 102  Resp: 18  Temp: 98.2 F (36.8 C)  SpO2: 92%   Filed Weights   06/28/20 1328  Weight: 136 lb 6.4 oz (61.9 kg)   Physical Exam   GENERAL: ill appearing male in NAD. Patient was in wheelchair with nasal cannula.  SKIN: Purpura noted in arms and feet.  EYES: conjunctiva are pink and non-injected, sclera clear OROPHARYNX: no exudate, no erythema; lips, buccal mucosa, and tongue normal. Poor dentition. NECK: supple, non-tender LYMPH:  no  palpable lymphadenopathy in the cervical, axillary or supraclavicular lymph nodes.  LUNGS: clear to auscultation and percussion with normal breathing effort HEART: regular rate & rhythm and no murmurs and no lower extremity edema ABDOMEN: mild distention and diffuse tenderness to palpation, normal bowel sounds Musculoskeletal: no cyanosis of digits and no clubbing  PSYCH: alert & oriented x 3, fluent speech NEURO: no focal motor/sensory deficits  LABORATORY DATA:  I have reviewed the data as listed CBC Latest Ref Rng & Units 06/26/2020 06/25/2020 06/24/2020  WBC 4.0 - 10.5 K/uL 7.5 8.3 8.1  Hemoglobin 13.0 - 17.0 g/dL 9.3(A) 3.5(T) 7.3(U)  Hematocrit 39.0 - 52.0 % 34.3(L) 34.0(L) 33.6(L)  Platelets 150 - 400 K/uL 73(L) 67(L) 67(L)    CMP Latest Ref Rng & Units 06/26/2020 06/25/2020 06/24/2020  Glucose 70 - 99 mg/dL 202(R) 427(C) 623(J)  BUN 6 - 20 mg/dL 62(G) 31(D) 17(O)  Creatinine 0.61 - 1.24 mg/dL 1.60 7.37(T) 0.62  Sodium 135 - 145 mmol/L 138 139 136  Potassium 3.5 - 5.1 mmol/L 4.3 3.8 3.7  Chloride 98 - 111 mmol/L 99 89(L) 88(L)  CO2 22 - 32 mmol/L 31 41(H) 38(H)  Calcium 8.9 -  10.3 mg/dL 6.9(S) 8.5(I) 6.2(V)  Total Protein 6.5 - 8.1 g/dL - 6.5 -  Total Bilirubin 0.3 - 1.2 mg/dL - 0.9 -  Alkaline Phos 38 - 126 U/L - 80 -  AST 15 - 41 U/L - 23 -  ALT 0 - 44 U/L - 20 -    RADIOGRAPHIC STUDIES: I have personally reviewed the radiological images as listed and agreed with the findings in the report. DG Chest 2 View  Result Date: 06/24/2020 CLINICAL DATA:  Right-sided pleural effusion EXAM: CHEST - 2 VIEW COMPARISON:  Prior chest x-ray 06/21/2020 FINDINGS: Small right layering pleural effusion. Stable cardiomegaly. Chronic bronchitic changes, interstitial prominence and hyperinflation. Interval resolution of pulmonary vascular congestion and mild edema. No pneumothorax. No acute osseous abnormality. IMPRESSION: 1. Small right pleural effusion. 2. Interval resolution of pulmonary vascular congestion and mild interstitial edema. 3. Background pulmonary hyperinflation and chronic bronchitic changes suggest underlying COPD. Electronically Signed   By: Malachy Moan M.D.   On: 06/24/2020 07:28   CT Head Wo Contrast  Result Date: 06/17/2020 CLINICAL DATA:  Abnormal mental status, head trauma EXAM: CT HEAD WITHOUT CONTRAST TECHNIQUE: Contiguous axial images were obtained from the base of the skull through the vertex without intravenous contrast. COMPARISON:  MRI 12/13/2018 FINDINGS: Brain: No acute intracranial abnormality. Specifically, no hemorrhage, hydrocephalus, mass lesion, acute infarction, or significant intracranial injury. Vascular: No hyperdense vessel or unexpected calcification. Skull: No acute calvarial abnormality. Sinuses/Orbits: No acute findings. Mucosal thickening in the paranasal sinuses. Mastoid air cells clear. Other: None IMPRESSION: No acute intracranial abnormality. Electronically Signed   By: Charlett Nose M.D.   On: 06/17/2020 22:48   CT Chest W Contrast  Result Date: 06/17/2020 CLINICAL DATA:  Shortness of breath for 2 weeks with altered mental status,  swelling and edema in the upper and lower extremities, 50% on room air EXAM: CT CHEST, ABDOMEN, AND PELVIS WITH CONTRAST TECHNIQUE: Multidetector CT imaging of the chest, abdomen and pelvis was performed following the standard protocol during bolus administration of intravenous contrast. CONTRAST:  OMNIPAQUE IOHEXOL 300 MG/ML  SOLN COMPARISON:  Radiograph 06/17/2020 renal ultrasound 03/17/2012, CT 03/01/2012 FINDINGS: CT CHEST FINDINGS Cardiovascular: Borderline cardiomegaly with trace pericardial effusion. Extensive three-vessel coronary artery atherosclerosis. The aortic root is suboptimally assessed given cardiac pulsation artifact. Atherosclerotic plaque within the  normal caliber aorta. No acute luminal abnormality of the imaged aorta. No periaortic stranding or hemorrhage. Normal 3 vessel branching of the aortic arch. Proximal great vessels are mildly calcified but otherwise unremarkable. Central pulmonary arteries are enlarged. No large central filling defects are evident within the limitations of this non tailored examination of the pulmonary arteries. Mediastinum/Nodes: Low-attenuation, borderline enlarged mediastinal and hilar lymph nodes are present including a 12 mm paratracheal lymph node (4/23), and 11 mm AP window lymph node (4/21) and a 12 mm left hilar lymph node (6/111). No acute abnormality of the trachea or esophagus. Thyroid gland and thoracic inlet are unremarkable. Lungs/Pleura: Large right and small left pleural effusion with areas of fairly uniformly enhancing atelectatic passive and dependent atelectasis though scattered fluid-filled and thickened airways are noted particularly towards the lung bases and some underlying degree of aspiration is not excluded. No pneumothorax. No other focal airspace opacity. No concerning pulmonary nodules or masses. Musculoskeletal: No acute osseous abnormality or suspicious osseous lesion. CT ABDOMEN PELVIS FINDINGS Hepatobiliary: No concerning focal  liver lesion. Diffuse periportal edema. Smooth liver surface contour. No significant gallbladder wall thickening. Pericholecystic fluid is likely related to redistributed ascites throughout the abdomen. Pancreas: No pancreatic ductal dilatation or surrounding inflammatory changes. Spleen: Slightly heterogeneous enhancement of the spleen may be related to contrast timing. Splenomegaly is present. Measuring up to 18 cm in craniocaudal dimension. Normal splenic cleft. No concerning splenic lesion. Adrenals/Urinary Tract: Normal adrenal glands. Symmetrically delayed renal enhancement and excretion. No visible focal renal lesion. No urolithiasis or hydronephrosis. Urinary bladder is unremarkable for degree of distension. Stomach/Bowel: Stomach and duodenum unremarkable. Slightly edematous appearance of the distal small bowel may be related to presence of moderate volume ascites and presumed volume third-spacing. No colonic dilatation or wall thickening. No evidence of bowel obstruction. The appendix is not well visualized. Vascular/Lymphatic: Atherosclerotic calcifications within the abdominal aorta and branch vessels. No aneurysm or ectasia. No enlarged abdominopelvic lymph nodes. Edematous appearing mesenteric nodes as well as some borderline enlarged though low-attenuation bilateral inguinal nodes. No other conspicuous enlarged abdominopelvic nodes. Reproductive: Coarse eccentric calcification of the prostate. No concerning abnormalities of the prostate or seminal vesicles. A markedly edematous changes of the soft tissues of the included internal genitalia. Other: Moderate volume ascites with additional edematous changes throughout the mesentery. Extensive circumferential body wall edema noted as well. Tiny fat containing umbilical hernia. No bowel containing hernias. Musculoskeletal: Bony fusion across the L1-2 L3 levels may reflect sequela of prior infection or injury. Given that this is a new finding since 2013. No  other acute or conspicuous osseous abnormalities. Bones of the pelvis appear intact and congruent. IMPRESSION: 1. Extensive features of anasarca with circumferential body wall edema, pleural and pericardial effusions, periportal edema, moderate volume ascites and edematous changes of the mesentery. 2. Uniformly enhancing atelectatic passive and dependent atelectasis adjacent the basilar effusions though scattered fluid-filled and thickened airways are noted particularly towards the lung bases and some underlying degree of aspiration is not excluded. 3. Low-attenuation, borderline enlarged mediastinal hilar, and abdominopelvic nodes are nonspecific but could be reactive or edematous given features elsewhere though follow-up imaging may be warranted given the presence of splenomegaly as well. 4. Delayed renal enhancement bilaterally, nonspecific though should correlate for clinical of diminished renal function. 5. Bony fusion across the L1-2 L3 levels may reflect sequela of prior infection or injury. New finding since 2013. 6. Aortic Atherosclerosis (ICD10-I70.0). 7. Coronary artery calcifications are present. Please note that the presence of coronary artery  calcium documents the presence of coronary artery disease, the severity of this disease and any potential stenosis cannot be assessed on this non-gated CT examination. Electronically Signed   By: Kreg Shropshire M.D.   On: 06/17/2020 23:07   CT ABDOMEN PELVIS W CONTRAST  Result Date: 06/24/2020 CLINICAL DATA:  Epigastric pain Generalized abdominal pain, reassess for ascites. Diffuse abdominal pain. 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 EXAM: CT ABDOMEN AND PELVIS WITH CONTRAST TECHNIQUE: Multidetector CT imaging of the abdomen and pelvis was performed using the standard protocol following bolus administration of intravenous contrast. CONTRAST:  OMNIPAQUE IOHEXOL 300 MG/ML  SOLN COMPARISON:  CT abdomen pelvis 06/17/2020, CT abdomen pelvis 03/01/2012 FINDINGS: Lower  chest: Interval decrease in size of a small right pleural effusion. Bilateral lower lobe atelectasis versus scarring. Coronary artery calcifications. Hepatobiliary: No focal liver abnormality. No gallstones, gallbladder wall thickening, or pericholecystic fluid. No biliary dilatation. Pancreas: No focal lesion. Normal pancreatic contour. No surrounding inflammatory changes. No main pancreatic ductal dilatation. Spleen: The spleen is enlarged measuring up to 18 cm (new from 2013). No focal splenic lesion. Adrenals/Urinary Tract: No adrenal nodule bilaterally. Bilateral kidneys enhance symmetrically. No hydronephrosis. No hydroureter. The urinary bladder is unremarkable. Stomach/Bowel: PO contrast reaches the cecum. Stomach is within normal limits. No evidence of bowel wall thickening or dilatation. Stool throughout the colon. The appendix not definitely identified. Vascular/Lymphatic: No abdominal aorta or iliac aneurysm. Mild atherosclerotic plaque of the aorta and its branches. Question prominent retroperitoneal lymph nodes without definite lymphadenopathy. No abdominal, pelvic, or inguinal lymphadenopathy. Reproductive: Prostate is unremarkable. Other: No intraperitoneal free fluid. No intraperitoneal free gas. No organized fluid collection. Musculoskeletal: No abdominal wall hernia or abnormality. No suspicious lytic or blastic osseous lesions. No acute displaced fracture. Multilevel degenerative changes of the spine. IMPRESSION: 1. Interval decrease in size of a small right pleural effusion. 2. Stable splenomegaly (18cm that is new from 2013). Question prominent retroperitoneal lymph nodes without definite lymphadenopathy. Findings suggestive of a lymphoproliferative disorder. Consider PET-CT for further evaluation. 3. Constipation. 4.  Aortic Atherosclerosis (ICD10-I70.0) - mild. Electronically Signed   By: Tish Frederickson M.D.   On: 06/24/2020 19:53   CT ABDOMEN PELVIS W CONTRAST  Result Date:  06/17/2020 CLINICAL DATA:  Shortness of breath for 2 weeks with altered mental status, swelling and edema in the upper and lower extremities, 50% on room air EXAM: CT CHEST, ABDOMEN, AND PELVIS WITH CONTRAST TECHNIQUE: Multidetector CT imaging of the chest, abdomen and pelvis was performed following the standard protocol during bolus administration of intravenous contrast. CONTRAST:  OMNIPAQUE IOHEXOL 300 MG/ML  SOLN COMPARISON:  Radiograph 06/17/2020 renal ultrasound 03/17/2012, CT 03/01/2012 FINDINGS: CT CHEST FINDINGS Cardiovascular: Borderline cardiomegaly with trace pericardial effusion. Extensive three-vessel coronary artery atherosclerosis. The aortic root is suboptimally assessed given cardiac pulsation artifact. Atherosclerotic plaque within the normal caliber aorta. No acute luminal abnormality of the imaged aorta. No periaortic stranding or hemorrhage. Normal 3 vessel branching of the aortic arch. Proximal great vessels are mildly calcified but otherwise unremarkable. Central pulmonary arteries are enlarged. No large central filling defects are evident within the limitations of this non tailored examination of the pulmonary arteries. Mediastinum/Nodes: Low-attenuation, borderline enlarged mediastinal and hilar lymph nodes are present including a 12 mm paratracheal lymph node (4/23), and 11 mm AP window lymph node (4/21) and a 12 mm left hilar lymph node (6/111). No acute abnormality of the trachea or esophagus. Thyroid gland and thoracic inlet are unremarkable. Lungs/Pleura: Large right and small left pleural effusion with  areas of fairly uniformly enhancing atelectatic passive and dependent atelectasis though scattered fluid-filled and thickened airways are noted particularly towards the lung bases and some underlying degree of aspiration is not excluded. No pneumothorax. No other focal airspace opacity. No concerning pulmonary nodules or masses. Musculoskeletal: No acute osseous abnormality or  suspicious osseous lesion. CT ABDOMEN PELVIS FINDINGS Hepatobiliary: No concerning focal liver lesion. Diffuse periportal edema. Smooth liver surface contour. No significant gallbladder wall thickening. Pericholecystic fluid is likely related to redistributed ascites throughout the abdomen. Pancreas: No pancreatic ductal dilatation or surrounding inflammatory changes. Spleen: Slightly heterogeneous enhancement of the spleen may be related to contrast timing. Splenomegaly is present. Measuring up to 18 cm in craniocaudal dimension. Normal splenic cleft. No concerning splenic lesion. Adrenals/Urinary Tract: Normal adrenal glands. Symmetrically delayed renal enhancement and excretion. No visible focal renal lesion. No urolithiasis or hydronephrosis. Urinary bladder is unremarkable for degree of distension. Stomach/Bowel: Stomach and duodenum unremarkable. Slightly edematous appearance of the distal small bowel may be related to presence of moderate volume ascites and presumed volume third-spacing. No colonic dilatation or wall thickening. No evidence of bowel obstruction. The appendix is not well visualized. Vascular/Lymphatic: Atherosclerotic calcifications within the abdominal aorta and branch vessels. No aneurysm or ectasia. No enlarged abdominopelvic lymph nodes. Edematous appearing mesenteric nodes as well as some borderline enlarged though low-attenuation bilateral inguinal nodes. No other conspicuous enlarged abdominopelvic nodes. Reproductive: Coarse eccentric calcification of the prostate. No concerning abnormalities of the prostate or seminal vesicles. A markedly edematous changes of the soft tissues of the included internal genitalia. Other: Moderate volume ascites with additional edematous changes throughout the mesentery. Extensive circumferential body wall edema noted as well. Tiny fat containing umbilical hernia. No bowel containing hernias. Musculoskeletal: Bony fusion across the L1-2 L3 levels may  reflect sequela of prior infection or injury. Given that this is a new finding since 2013. No other acute or conspicuous osseous abnormalities. Bones of the pelvis appear intact and congruent. IMPRESSION: 1. Extensive features of anasarca with circumferential body wall edema, pleural and pericardial effusions, periportal edema, moderate volume ascites and edematous changes of the mesentery. 2. Uniformly enhancing atelectatic passive and dependent atelectasis adjacent the basilar effusions though scattered fluid-filled and thickened airways are noted particularly towards the lung bases and some underlying degree of aspiration is not excluded. 3. Low-attenuation, borderline enlarged mediastinal hilar, and abdominopelvic nodes are nonspecific but could be reactive or edematous given features elsewhere though follow-up imaging may be warranted given the presence of splenomegaly as well. 4. Delayed renal enhancement bilaterally, nonspecific though should correlate for clinical of diminished renal function. 5. Bony fusion across the L1-2 L3 levels may reflect sequela of prior infection or injury. New finding since 2013. 6. Aortic Atherosclerosis (ICD10-I70.0). 7. Coronary artery calcifications are present. Please note that the presence of coronary artery calcium documents the presence of coronary artery disease, the severity of this disease and any potential stenosis cannot be assessed on this non-gated CT examination. Electronically Signed   By: Kreg Shropshire M.D.   On: 06/17/2020 23:07   DG Chest Port 1 View  Result Date: 06/21/2020 CLINICAL DATA:  Shortness of breath. EXAM: PORTABLE CHEST 1 VIEW COMPARISON:  Chest x-ray 06/20/2020.  Chest CT 06/17/2020. FINDINGS: Heart size upper normal. Vascular congestion again noted with diffuse interstitial opacity. Hazy opacity at the right base compatible with known pleural effusion seen on previous CT. The visualized bony structures of the thorax show no acute abnormality.  Telemetry leads overlie the chest. IMPRESSION: No  substantial change since chest x-ray from yesterday. Electronically Signed   By: Kennith Center M.D.   On: 06/21/2020 13:30   DG Chest Port 1 View  Result Date: 06/20/2020 CLINICAL DATA:  50 year old male with chest pain and shortness of breath. Smoker. EXAM: PORTABLE CHEST 1 VIEW COMPARISON:  CT Chest, Abdomen, and Pelvis 06/17/2020 and earlier. FINDINGS: Portable AP semi upright view at 0901 hours. Significantly regressed right pleural effusion. Small residual suspected. Decreased but not resolved bilateral perihilar interstitial opacity slightly greater on the right. Normal cardiac size and mediastinal contours. Visualized tracheal air column is within normal limits. No pneumothorax. No areas of worsening ventilation. Paucity of bowel gas in the upper abdomen. No acute osseous abnormality identified. Prior cervical ACDF. IMPRESSION: 1. Regressed but not resolved right pleural effusion and bilateral perihilar opacity, most resembling vascular congestion. 2. No new cardiopulmonary abnormality. Electronically Signed   By: Odessa Fleming M.D.   On: 06/20/2020 09:16   DG Chest Portable 1 View  Result Date: 06/17/2020 CLINICAL DATA:  Shortness of breath EXAM: PORTABLE CHEST 1 VIEW COMPARISON:  12/12/2018 FINDINGS: Shallow inspiration. Small to moderate right pleural effusion with consolidation in the right lung base. Linear atelectasis or infiltration in the left base. Cardiac enlargement. No vascular congestion. Calcification of the aorta. IMPRESSION: Small to moderate right pleural effusion with consolidation in the right lung base. Linear atelectasis or infiltration in the left base. Electronically Signed   By: Burman Nieves M.D.   On: 06/17/2020 20:18   ECHOCARDIOGRAM COMPLETE  Result Date: 06/18/2020    ECHOCARDIOGRAM REPORT   Patient Name:   David Short Date of Exam: 06/18/2020 Medical Rec #:  [id number]         Height:       72.0 in Accession #:     [id number]        Weight:       166.0 lb Date of Birth:  Jul 28, 1970         BSA:          1.968 m Patient Age:    49 years          BP:           115/71 mmHg Patient Gender: M                 HR:           101 bpm. Exam Location:  Inpatient Procedure: 2D Echo, Color Doppler and Cardiac Doppler Indications:    I51.7 Cardiomegaly; Acute Respiratory Distress R06.03  History:        Patient has no prior history of Echocardiogram examinations.  Sonographer:    Tiffany Dance Referring Phys: [id number] Charlotte Sanes IMPRESSIONS  1. Right ventricular systolic function is normal. The right ventricular size is severely enlarged. There is moderately elevated pulmonary artery systolic pressure. The estimated right ventricular systolic pressure is 52.5 mmHg. Right ventricular apex not well visualized. Coronary sinus dilation consistent with right ventricular overload.  2. Right atrial size was severely dilated.  3. A small pericardial effusion is present. The pericardial effusion is circumferential. There is no evidence of increased pericardial pressure.  4. Left ventricular ejection fraction, by estimation, is 65 to 70%. The left ventricle has normal function. The left ventricle has no regional wall motion abnormalities. There is mild concentric left ventricular hypertrophy. Left ventricular diastolic parameters were normal. There is the interventricular septum is flattened in systole, consistent with right ventricular pressure overload.  5. Tricuspid valve  regurgitation is moderate and eccentric best seen in A4c view .  6. Left atrial size was mildly dilated.  7. The mitral valve is normal in structure. Trivial mitral valve regurgitation.  8. The aortic valve is tricuspid. Aortic valve regurgitation is not visualized. No aortic stenosis is present.  9. The inferior vena cava is dilated in size with <50% respiratory variability, suggesting right atrial pressure of 15 mmHg. Comparison(s): No prior Echocardiogram. FINDINGS  Left  Ventricle: Left ventricular ejection fraction, by estimation, is 65 to 70%. The left ventricle has normal function. The left ventricle has no regional wall motion abnormalities. The left ventricular internal cavity size was normal in size. There is  mild concentric left ventricular hypertrophy. The interventricular septum is flattened in systole, consistent with right ventricular pressure overload. Left ventricular diastolic parameters were normal. Right Ventricle: The right ventricular size is severely enlarged. No increase in right ventricular wall thickness. Right ventricular systolic function is normal. There is moderately elevated pulmonary artery systolic pressure. The tricuspid regurgitant velocity is 3.06 m/s, and with an assumed right atrial pressure of 15 mmHg, the estimated right ventricular systolic pressure is 52.5 mmHg. Left Atrium: Left atrial size was mildly dilated. Right Atrium: Right atrial size was severely dilated. Pericardium: A small pericardial effusion is present. The pericardial effusion is circumferential. There is no evidence of cardiac tamponade. Mitral Valve: The mitral valve is normal in structure. Trivial mitral valve regurgitation. Tricuspid Valve: The tricuspid valve is normal in structure. Tricuspid valve regurgitation is moderate . No evidence of tricuspid stenosis. Aortic Valve: The aortic valve is tricuspid. Aortic valve regurgitation is not visualized. No aortic stenosis is present. Pulmonic Valve: Discrepancy between Spectral Doppler and Color Doppler. The pulmonic valve was grossly normal. Pulmonic valve regurgitation is not visualized. No evidence of pulmonic stenosis. Aorta: The aortic root and ascending aorta are structurally normal, with no evidence of dilitation. Venous: The inferior vena cava is dilated in size with less than 50% respiratory variability, suggesting right atrial pressure of 15 mmHg. IAS/Shunts: The atrial septum is grossly normal.  LEFT VENTRICLE PLAX 2D  LVIDd:         4.80 cm  Diastology LVIDs:         2.30 cm  LV e' medial:    11.00 cm/s LV PW:         1.30 cm  LV E/e' medial:  8.5 LV IVS:        1.10 cm  LV e' lateral:   10.30 cm/s LVOT diam:     2.00 cm  LV E/e' lateral: 9.0 LV SV:         75 LV SV Index:   38 LVOT Area:     3.14 cm  RIGHT VENTRICLE             IVC RV Basal diam:  3.80 cm     IVC diam: 3.30 cm RV Mid diam:    2.80 cm RV S prime:     16.50 cm/s TAPSE (M-mode): 1.9 cm LEFT ATRIUM              Index       RIGHT ATRIUM           Index LA diam:        5.60 cm  2.84 cm/m  RA Area:     28.50 cm LA Vol (A2C):   122.0 ml 61.98 ml/m RA Volume:   111.00 ml 56.39 ml/m LA Vol (A4C):   46.7  ml  23.73 ml/m LA Biplane Vol: 75.8 ml  38.51 ml/m  AORTIC VALVE LVOT Vmax:   123.00 cm/s LVOT Vmean:  82.400 cm/s LVOT VTI:    0.239 m  AORTA Ao Root diam: 3.60 cm Ao Asc diam:  3.30 cm MITRAL VALVE               TRICUSPID VALVE MV Area (PHT): 3.48 cm    TR Peak grad:   37.5 mmHg MV Decel Time: 218 msec    TR Vmax:        306.00 cm/s MV E velocity: 93.00 cm/s MV A velocity: 88.10 cm/s  SHUNTS MV E/A ratio:  1.06        Systemic VTI:  0.24 m                            Systemic Diam: 2.00 cm Riley Lam MD Electronically signed by Riley Lam MD Signature Date/Time: 06/18/2020/4:36:14 PM    Final     ASSESSMENT & PLAN David Short is a 50 y.o. male presenting to the clinic for evaluation for iron deficiency anemia and thrombocytopenia in the setting of splenomegaly. Recommend labs to check CBC, CMP, LDH, Iron and TIBC, Ferritin, Retin Panel, Hep B and Hep C panel. In addition, we proceed with workup for lymphadenopathy seen on recent CT imaging with a PET/CT scan.   #Iron deficiency anemia: --Recommend IV iron infusion.  --Patient is scheduled to follow up with GI on 07/15/2020 to further evaluate cause of anemia.   #Thrombocytopenia: --Concerned for underlying liver disease due to vascular congestion and splenomengaly seen on CT  imaging from 06/24/2020 although no evidence of cirrhosis was mentioned in report.  --Plan to evaluate for infectious processes including hepatitis B and C. HIV status was checked on 06/18/2020 which was non-reactive.   #Lymphadenopathy: --CT imaging from 06/17/2020 revealed borderline enlarged mediastinal, hilar and abdominopelvic lymph nodes.  --Will obtain PET/CT scan to further evaluate for underlying malignant hematologic process.   #Abdominal pain: --Uncertain etiology but differentials include moderate volume ascites seen on CT imaging from 06/17/2020. Will further evaluate with upcoming PET/CT scan.  --Patient was prescribed 3 day supply of oxycodone 10 mg q 6 hours but patient takes q 4 hours. Patient is out of medication.  --Recent history of IV heroin use, last use on 06/17/2020 per patient --Urgent referral to pain clinic. Will refill 7 day supply of pain medication.   #Health maintenance: --Patient does not have PCP so will request a referral to establish care.     Orders Placed This Encounter  Procedures  . NM PET Image Initial (PI) Skull Base To Thigh    Standing Status:   Future    Standing Expiration Date:   06/28/2021    Order Specific Question:   If indicated for the ordered procedure, I authorize the administration of a radiopharmaceutical per Radiology protocol    Answer:   Yes    Order Specific Question:   Preferred imaging location?    Answer:   [facility]  . CBC with Differential (Cancer Center Only)    Standing Status:   Future    Number of Occurrences:   1    Standing Expiration Date:   06/28/2021  . CMP (Cancer Center only)    Standing Status:   Future    Number of Occurrences:   1    Standing Expiration Date:   06/28/2021  . Retic Panel  Standing Status:   Future    Number of Occurrences:   1    Standing Expiration Date:   06/28/2021  . Lactate dehydrogenase (LDH)    Standing Status:   Future    Number of Occurrences:   1    Standing Expiration Date:    06/28/2021  . Hepatitis B surface antibody    Standing Status:   Future    Number of Occurrences:   1    Standing Expiration Date:   06/28/2021  . Hepatitis B surface antigen    Standing Status:   Future    Number of Occurrences:   1    Standing Expiration Date:   06/28/2021  . Hepatitis B core antibody, total    Standing Status:   Future    Number of Occurrences:   1    Standing Expiration Date:   06/28/2021  . Hepatitis C antibody    Standing Status:   Future    Number of Occurrences:   1    Standing Expiration Date:   06/28/2021  . Iron and TIBC    Standing Status:   Future    Number of Occurrences:   1    Standing Expiration Date:   06/28/2021  . Ferritin    Standing Status:   Future    Number of Occurrences:   1    Standing Expiration Date:   06/28/2021  . Ambulatory referral to Internal Medicine    Referral Priority:   Urgent    Referral Type:   Consultation    Referral Reason:   Specialty Services Required    Requested Specialty:   Internal Medicine    Number of Visits Requested:   1  . Ambulatory referral to Pain Clinic    Referral Priority:   Urgent    Referral Type:   Consultation    Referral Reason:   Specialty Services Required    Requested Specialty:   Pain Medicine    Number of Visits Requested:   1    All questions were answered. The patient knows to call the clinic with any problems, questions or concerns.  A total of more than 60 minutes were spent on this encounter and over half of that time was spent on counseling and coordination of care as outlined above.    Georga Kaufmann, PA-C Department of Hematology/Oncology [facility] at [facility] Phone: [phone]  Patient was seen with Dr. Leonides Schanz.   I have read the above note and personally examined the patient. I agree with the assessment and plan as noted above.  Briefly David Short is a 50 year old male with medical history significant for IV drug use who presents as  outpatient follow-up for anemia and thrombocytopenia.  His labs are most consistent with an iron deficiency anemia.  On review of the CT scan he does have an extensive splenomegaly measuring up to 18 cm.  Interestingly the scan did not show any clear evidence of liver disease, though there is marked vascular congestion arising from the liver; it is highly likely that liver disease is causing his splenomegaly.  At this time I do believe that all of his findings can be related back to his liver disease.  It is possible he has varices which are causing a slow leaking bleed leading him to be iron deficient.  He does have some borderline lymphadenopathy in the chest and the abdomen and we do agree with obtaining a PET CT scan in  order to help rule out hematological malignancy.  I would recommend additionally we check serologies for hepatitis B and C.  He tested negative for HIV during his current admission.  We will determine when to bring him back based on the lab results from today's visit.  We appreciate the assistance of the GI team in the management of this patient.   Ulysees Barns, MD Department of Hematology/Oncology [facility] at [facility] Phone: [phone] Pager: [phone] Email: [email or web address]

## 2020-06-29 ENCOUNTER — Telehealth: Payer: Self-pay | Admitting: Physician Assistant

## 2020-06-29 LAB — IRON AND TIBC
Iron: 49 ug/dL (ref 42–163)
Saturation Ratios: 11 % — ABNORMAL LOW (ref 20–55)
TIBC: 438 ug/dL — ABNORMAL HIGH (ref 202–409)
UIBC: 388 ug/dL — ABNORMAL HIGH (ref 117–376)

## 2020-06-29 LAB — FERRITIN: Ferritin: 39 ng/mL (ref 24–336)

## 2020-06-29 LAB — HEPATITIS B SURFACE ANTIGEN: Hepatitis B Surface Ag: REACTIVE — AB

## 2020-06-29 NOTE — Telephone Encounter (Signed)
I called [name] to review his lab results from yesterday.  Patient continues to be anemic secondary to iron deficiency.  We will arrange for IV iron by next week.  In addition, hepatitis B and C serologies were obtained concerning for active infection.  Patient is scheduled to follow-up with GI on 07/15/2020.  I will reach out to the GI team to provide an update on the recent hepatitis serology results.

## 2020-06-30 ENCOUNTER — Emergency Department (HOSPITAL_COMMUNITY)
Admission: EM | Admit: 2020-06-30 | Discharge: 2020-06-30 | Disposition: A | Discharge status: Home / Self Care | Payer: Medicaid Other | Attending: Emergency Medicine | Admitting: Emergency Medicine

## 2020-06-30 ENCOUNTER — Telehealth: Payer: Self-pay | Admitting: *Deleted

## 2020-06-30 ENCOUNTER — Other Ambulatory Visit: Payer: Self-pay

## 2020-06-30 ENCOUNTER — Encounter (HOSPITAL_COMMUNITY): Payer: Self-pay

## 2020-06-30 DIAGNOSIS — J449 Chronic obstructive pulmonary disease, unspecified: Secondary | ICD-10-CM | POA: Insufficient documentation

## 2020-06-30 DIAGNOSIS — F17210 Nicotine dependence, cigarettes, uncomplicated: Secondary | ICD-10-CM | POA: Insufficient documentation

## 2020-06-30 DIAGNOSIS — Z79899 Other long term (current) drug therapy: Secondary | ICD-10-CM | POA: Diagnosis not present

## 2020-06-30 DIAGNOSIS — T50901A Poisoning by unspecified drugs, medicaments and biological substances, accidental (unintentional), initial encounter: Secondary | ICD-10-CM

## 2020-06-30 DIAGNOSIS — I110 Hypertensive heart disease with heart failure: Secondary | ICD-10-CM | POA: Insufficient documentation

## 2020-06-30 DIAGNOSIS — I509 Heart failure, unspecified: Secondary | ICD-10-CM | POA: Insufficient documentation

## 2020-06-30 DIAGNOSIS — T402X1A Poisoning by other opioids, accidental (unintentional), initial encounter: Secondary | ICD-10-CM | POA: Diagnosis not present

## 2020-06-30 NOTE — ED Provider Notes (Signed)
[facility] EMERGENCY DEPARTMENT Provider Note   CSN: [id number] Arrival date & time: 06/30/20  1211     History Chief Complaint  Patient presents with  . Drug Overdose    ADEKUNLE ROHRBACH is a 50 y.o. male.  HPI   50 year old male with past medical history of COPD on home oxygen, IV drug abuse, heroin overdose presents the emergency department after being found unresponsive.  Patient was recently discharged home with a prescription for oxycodone.  He admits that this morning he took about 45 pills in an attempt to resolve his back pain so that he could sleep.  He states he has not been sleeping secondary to the pain.  Following this he says he laid down in bed, he believes his cousin found him unresponsive in bed.  Report from EMS is that he had a pulse, he was bagged, given Narcan twice intranasally.  He became responsive, alert and oriented and talking.  Patient is slightly drowsy on exam but awake, appropriate responses.  States he did not take the medication in attempt to hurt him, no suicidal thoughts.  States that he just "needed to rest".  Past Medical History:  Diagnosis Date  . Chronic back pain   . IVDU (intravenous drug user)    heroin   . Osteomyelitis (HCC)   . Tobacco use     Patient Active Problem List   Diagnosis Date Noted  . Lymphadenopathy 06/28/2020  . Abdominal pain 06/28/2020  . Other ascites   . Iron deficiency anemia   . CHF (congestive heart failure) (HCC) 06/18/2020  . Bacteremia due to Gram-negative bacteria 12/30/2018  . Vertebral osteomyelitis, acute-C5-C6 12/26/2018  . Neck pain 12/15/2018  . Malnutrition of moderate degree 12/15/2018  . S/P cervical spinal fusion 12/14/2018  . Aseptic meningitis 12/12/2018  . Accelerated hypertension 12/12/2018  . Sinus tachycardia 12/12/2018  . Thrombocytopenia (HCC) 12/12/2018    Past Surgical History:  Procedure Laterality Date  . ANTERIOR CERVICAL DECOMP/DISCECTOMY FUSION N/A  12/14/2018   Procedure: ANTERIOR CERVICAL DISCECTOMY FUSION CERVICAL FIVE- CERVICAL SIX;  Surgeon: Tia Alert, MD;  Location: [facility] OR;  Service: Neurosurgery;  Laterality: N/A;  . BACK SURGERY     states never had surgery  . FRACTURE SURGERY Left    elbow/wrist/ankle       Family History  Problem Relation Age of Onset  . Hypertension Sister   . Hypertension Brother   . Colon cancer Maternal Grandmother   . Prostate cancer Maternal Grandfather   . Colon cancer Maternal Grandfather     Social History   Tobacco Use  . Smoking status: Current Every Day Smoker    Packs/day: 1.00    Types: Cigarettes  . Smokeless tobacco: Never Used  Vaping Use  . Vaping Use: Never used  Substance Use Topics  . Alcohol use: Not Currently  . Drug use: Not Currently    Types: IV, Heroin    Comment: Last use was 06/17/20.  Used to take 1/2 gm per day.     Home Medications Prior to Admission medications   Medication Sig Start Date End Date Taking? Authorizing Provider  albuterol (VENTOLIN HFA) 108 (90 Base) MCG/ACT inhaler Inhale 2 puffs into the lungs every 6 (six) hours as needed for wheezing or shortness of breath. 06/26/20   Rai, Delene Ruffini, MD  carvedilol (COREG) 3.125 MG tablet Take 1 tablet (3.125 mg total) by mouth 2 (two) times daily with a meal. 06/26/20   Rai, Ripudeep  K, MD  furosemide (LASIX) 20 MG tablet Take 1 tablet (20 mg total) by mouth daily. 06/26/20 07/26/20  Rai, Delene Ruffini, MD  hydrocortisone (ANUSOL-HC) 25 MG suppository Place 1 suppository (25 mg total) rectally 2 (two) times daily. For hemorrhoids 06/26/20   Rai, Delene Ruffini, MD  Oxycodone HCl 10 MG TABS Take 1 tablet (10 mg total) by mouth every 4 (four) hours for 7 days. 06/28/20 07/05/20  Briant Cedar, PA-C  pantoprazole (PROTONIX) 40 MG tablet Take 1 tablet (40 mg total) by mouth daily. 06/26/20   Rai, Delene Ruffini, MD  predniSONE (DELTASONE) 10 MG tablet Prednisone dosing: Take  Prednisone 40mg  (4 tabs) x 1days, then taper to  30mg  (3 tabs) x 3 days, then 20mg  (2 tabs) x 3days, then 10mg  (1 tab) x 3days, then OFF. 06/27/20   Rai, Ripudeep K, MD    Allergies    Tylenol [acetaminophen]  Review of Systems   Review of Systems  Constitutional: Negative for chills and fever.  HENT: Negative for congestion.   Respiratory: Negative for shortness of breath.   Cardiovascular: Negative for chest pain.  Gastrointestinal: Negative for abdominal pain, diarrhea and vomiting.  Skin: Negative for rash.  Neurological: Negative for headaches.  Psychiatric/Behavioral: Negative for self-injury and suicidal ideas.    Physical Exam Updated Vital Signs BP 103/60 (BP Location: Right Arm)   Pulse 96   Temp (!) 97.3 F (36.3 C) (Oral)   Resp 14   SpO2 92%   Physical Exam Vitals and nursing note reviewed.  Constitutional:      Appearance: Normal appearance.  HENT:     Head: Normocephalic.     Mouth/Throat:     Mouth: Mucous membranes are dry.  Eyes:     Pupils: Pupils are equal, round, and reactive to light.  Cardiovascular:     Rate and Rhythm: Normal rate.  Pulmonary:     Effort: Pulmonary effort is normal.     Comments: Wet cough that the patient states is baseline, scattered rales on auscultation Abdominal:     Palpations: Abdomen is soft.     Tenderness: There is no abdominal tenderness.  Skin:    General: Skin is warm.  Neurological:     Mental Status: He is alert and oriented to person, place, and time.     Comments: Drowsy but easily arousable, conversational, oriented  Psychiatric:        Mood and Affect: Mood normal.     ED Results / Procedures / Treatments   Labs (all labs ordered are listed, but only abnormal results are displayed) Labs Reviewed - No data to display  EKG None  Radiology No results found.  Procedures Procedures   Medications Ordered in ED Medications - No data to display  ED Course  I have reviewed the triage vital signs and the nursing notes.  Pertinent labs &  imaging results that were available during my care of the patient were reviewed by me and considered in my medical decision making (see chart for details).    MDM Rules/Calculators/A&P                          50 year old male presents emergency department in an apparent opiate overdose, found unresponsive.  Responded to second dose of intranasal Narcan.  Arrives drowsy but arousable, conversational and alert.  Vitals are stable.  Plan to monitor.  Patient has been monitored in the department over 4 hours.  He has  metabolized, he is now sitting up, awake, appropriate, no drowsiness.  Does not appear to have any side effect from the Narcan or resedation.  Vitals are stable.  Respiratory status is baseline.  He has been able to eat and drink without any difficulty.  He continues to deny any SI or intention in regards to this overdose.  Patient's son is at bedside.  He is happy to take the patient home and monitor him.  I had a long discussion with the patient about the dangers of taking more medication than prescribed.  I advised him not to mix any pain medicine with alcohol and to avoid any illegal substances.  Patient states that he understands and again denies any self-harm.  You have been seen and discharged from the emergency department.  Follow-up with your primary provider for reevaluation and further care. Take home medications as prescribed. If you have any worsening symptoms or further concerns for health please return to an emergency department for further evaluation.  Final Clinical Impression(s) / ED Diagnoses Final diagnoses:  None    Rx / DC Orders ED Discharge Orders    None       Rozelle Logan, DO 06/30/20 1653

## 2020-06-30 NOTE — ED Notes (Signed)
Drowsy, awakens easily, A/O

## 2020-06-30 NOTE — ED Notes (Signed)
Son at bedside. Drowsy/awake. Remains oriented x 4.

## 2020-06-30 NOTE — Telephone Encounter (Signed)
TCT patient's mother in response to her vm message left here. Spoke with her and advised that Georga Kaufmann, PA has requested IV iron infusion appts for hopefully, next week. Advised that I will put her phone # in as the contact for appts as patient does not always answer his phone and has no vm set up.  Evanna voiced understanding.  She is aware of GI appts for possible Hep B and C infections.

## 2020-06-30 NOTE — Discharge Instructions (Addendum)
You have been seen and discharged from the emergency department.  Avoid taking more than the prescribed amount of pain medicine, this can result in sedation and death.  Do not mix pain medicine with alcohol.  Avoid illegal substance abuse as this is detrimental to your health.  Follow-up with your primary provider for reevaluation and further care. Take home medications as prescribed. If you have any worsening symptoms or further concerns for health please return to an emergency department for further evaluation.

## 2020-06-30 NOTE — ED Triage Notes (Signed)
Found unresponsive and not breathing, positive pulse. Bagged, Narcan x2 IN and with 2nd dose did become responsive, A/O and talking. Was discharged from [facility] days ago and had taken 39 10mg  oxycodone since d/c for chronic back pain. BP 132/70, HR 90's, CBG 232, RR 16-18 after narcan, denies pain, c/o feeling exhausted. Denies any other drug use today.

## 2020-06-30 NOTE — ED Notes (Signed)
Sitting up eating a sandwich

## 2020-06-30 NOTE — ED Notes (Signed)
[phone] SON DUSTIN

## 2020-07-05 NOTE — Progress Notes (Signed)
..  The following Medication: David Short has been approved thru [employer] as Assistance Program. Enrollment period is 07/05/2020 to 07/05/2021.  Assistance ID: [id number].  Reason for Assistance: Self Pay First DOS: 07/07/2020.

## 2020-07-07 ENCOUNTER — Other Ambulatory Visit: Payer: Self-pay

## 2020-07-07 ENCOUNTER — Inpatient Hospital Stay: Payer: Medicaid Other

## 2020-07-07 VITALS — BP 124/71 | HR 94 | Temp 98.1°F | Resp 16

## 2020-07-07 DIAGNOSIS — R590 Localized enlarged lymph nodes: Secondary | ICD-10-CM

## 2020-07-07 DIAGNOSIS — D508 Other iron deficiency anemias: Secondary | ICD-10-CM

## 2020-07-07 DIAGNOSIS — D509 Iron deficiency anemia, unspecified: Secondary | ICD-10-CM | POA: Diagnosis not present

## 2020-07-07 MED ORDER — SODIUM CHLORIDE 0.9 % IV SOLN
Freq: Once | INTRAVENOUS | Status: DC
  Filled 2020-07-07: qty 250

## 2020-07-07 MED ORDER — SODIUM CHLORIDE 0.9 % IV SOLN
510.0000 mg | Freq: Once | INTRAVENOUS | Status: AC
  Administered 2020-07-07: 510 mg via INTRAVENOUS
  Filled 2020-07-07: qty 510

## 2020-07-07 NOTE — Progress Notes (Signed)
Patient presents to [facility] with multiple complaints including SOB, chest pain, back pain, fatigue, dizziness. Pt states back pain is chronic but chest pain started this am. Denies n/v. Denies radiation of pain and states it eases with repositioning. Offered to take pt to ED but declined stating that he "always" has chest pain and that he is "just sore" from chronic pain. Pt states he was expecting to see MD today to get refills on pain meds as he was only given a 7 day supply last week.  Per 4/12 note by Karena Addison, [place], referral to pain clinic was to be placed. Pt denies hearing from pain clinic. Lesage, [place] and Hurley, RN re: pt's refill request. Karena Addison declines to refill pain meds and will f/u on pain clinic referral. Pt questions what to do about pain meds in the meantime. Phone number provided for [facility] to contact his provider for additional questions.  Pt declined 30 minute post-observation for Feraheme. Denies any signs/sx of allergic rxn. dph

## 2020-07-07 NOTE — Patient Instructions (Signed)
Ferumoxytol injection What is this medicine? FERUMOXYTOL is an iron complex. Iron is used to make healthy red blood cells, which carry oxygen and nutrients throughout the body. This medicine is used to treat iron deficiency anemia. This medicine may be used for other purposes; ask your health care provider or pharmacist if you have questions. COMMON BRAND NAME(S): Feraheme What should I tell my health care provider before I take this medicine? They need to know if you have any of these conditions:  anemia not caused by low iron levels  high levels of iron in the blood  magnetic resonance imaging (MRI) test scheduled  an unusual or allergic reaction to iron, other medicines, foods, dyes, or preservatives  pregnant or trying to get pregnant  breast-feeding How should I use this medicine? This medicine is for injection into a vein. It is given by a health care professional in a hospital or clinic setting. Talk to your pediatrician regarding the use of this medicine in children. Special care may be needed. Overdosage: If you think you have taken too much of this medicine contact a poison control center or emergency room at once. NOTE: This medicine is only for you. Do not share this medicine with others. What if I miss a dose? It is important not to miss your dose. Call your doctor or health care professional if you are unable to keep an appointment. What may interact with this medicine? This medicine may interact with the following medications:  other iron products This list may not describe all possible interactions. Give your health care provider a list of all the medicines, herbs, non-prescription drugs, or dietary supplements you use. Also tell them if you smoke, drink alcohol, or use illegal drugs. Some items may interact with your medicine. What should I watch for while using this medicine? Visit your doctor or healthcare professional regularly. Tell your doctor or healthcare  professional if your symptoms do not start to get better or if they get worse. You may need blood work done while you are taking this medicine. You may need to follow a special diet. Talk to your doctor. Foods that contain iron include: whole grains/cereals, dried fruits, beans, or peas, leafy green vegetables, and organ meats (liver, kidney). What side effects may I notice from receiving this medicine? Side effects that you should report to your doctor or health care professional as soon as possible:  allergic reactions like skin rash, itching or hives, swelling of the face, lips, or tongue  breathing problems  changes in blood pressure  feeling faint or lightheaded, falls  fever or chills  flushing, sweating, or hot feelings  swelling of the ankles or feet Side effects that usually do not require medical attention (report to your doctor or health care professional if they continue or are bothersome):  diarrhea  headache  nausea, vomiting  stomach pain This list may not describe all possible side effects. Call your doctor for medical advice about side effects. You may report side effects to FDA at 1-800-FDA-1088. Where should I keep my medicine? This drug is given in a hospital or clinic and will not be stored at home. NOTE: This sheet is a summary. It may not cover all possible information. If you have questions about this medicine, talk to your doctor, pharmacist, or health care provider.  2021 Elsevier/Gold Standard (2016-04-23 20:21:10)  

## 2020-07-14 ENCOUNTER — Other Ambulatory Visit: Payer: Self-pay

## 2020-07-14 ENCOUNTER — Inpatient Hospital Stay: Payer: Medicaid Other

## 2020-07-14 VITALS — BP 124/84 | HR 93 | Resp 17

## 2020-07-14 DIAGNOSIS — D508 Other iron deficiency anemias: Secondary | ICD-10-CM

## 2020-07-14 MED ORDER — SODIUM CHLORIDE 0.9 % IV SOLN
510.0000 mg | Freq: Once | INTRAVENOUS | Status: AC
  Administered 2020-07-14: 510 mg via INTRAVENOUS
  Filled 2020-07-14: qty 17

## 2020-07-14 MED ORDER — SODIUM CHLORIDE 0.9 % IV SOLN
Freq: Once | INTRAVENOUS | Status: AC
  Filled 2020-07-14: qty 250

## 2020-07-14 NOTE — Patient Instructions (Signed)
Ferumoxytol injection What is this medicine? FERUMOXYTOL is an iron complex. Iron is used to make healthy red blood cells, which carry oxygen and nutrients throughout the body. This medicine is used to treat iron deficiency anemia. This medicine may be used for other purposes; ask your health care provider or pharmacist if you have questions. COMMON BRAND NAME(S): Feraheme What should I tell my health care provider before I take this medicine? They need to know if you have any of these conditions:  anemia not caused by low iron levels  high levels of iron in the blood  magnetic resonance imaging (MRI) test scheduled  an unusual or allergic reaction to iron, other medicines, foods, dyes, or preservatives  pregnant or trying to get pregnant  breast-feeding How should I use this medicine? This medicine is for injection into a vein. It is given by a health care professional in a hospital or clinic setting. Talk to your pediatrician regarding the use of this medicine in children. Special care may be needed. Overdosage: If you think you have taken too much of this medicine contact a poison control center or emergency room at once. NOTE: This medicine is only for you. Do not share this medicine with others. What if I miss a dose? It is important not to miss your dose. Call your doctor or health care professional if you are unable to keep an appointment. What may interact with this medicine? This medicine may interact with the following medications:  other iron products This list may not describe all possible interactions. Give your health care provider a list of all the medicines, herbs, non-prescription drugs, or dietary supplements you use. Also tell them if you smoke, drink alcohol, or use illegal drugs. Some items may interact with your medicine. What should I watch for while using this medicine? Visit your doctor or healthcare professional regularly. Tell your doctor or healthcare  professional if your symptoms do not start to get better or if they get worse. You may need blood work done while you are taking this medicine. You may need to follow a special diet. Talk to your doctor. Foods that contain iron include: whole grains/cereals, dried fruits, beans, or peas, leafy green vegetables, and organ meats (liver, kidney). What side effects may I notice from receiving this medicine? Side effects that you should report to your doctor or health care professional as soon as possible:  allergic reactions like skin rash, itching or hives, swelling of the face, lips, or tongue  breathing problems  changes in blood pressure  feeling faint or lightheaded, falls  fever or chills  flushing, sweating, or hot feelings  swelling of the ankles or feet Side effects that usually do not require medical attention (report to your doctor or health care professional if they continue or are bothersome):  diarrhea  headache  nausea, vomiting  stomach pain This list may not describe all possible side effects. Call your doctor for medical advice about side effects. You may report side effects to FDA at 1-800-FDA-1088. Where should I keep my medicine? This drug is given in a hospital or clinic and will not be stored at home. NOTE: This sheet is a summary. It may not cover all possible information. If you have questions about this medicine, talk to your doctor, pharmacist, or health care provider.  2021 Elsevier/Gold Standard (2016-04-23 20:21:10)  

## 2020-07-15 ENCOUNTER — Other Ambulatory Visit: Payer: Self-pay

## 2020-07-15 ENCOUNTER — Ambulatory Visit (INDEPENDENT_AMBULATORY_CARE_PROVIDER_SITE_OTHER): Payer: Self-pay | Admitting: Gastroenterology

## 2020-07-15 ENCOUNTER — Encounter (HOSPITAL_COMMUNITY): Payer: Self-pay | Admitting: Emergency Medicine

## 2020-07-15 ENCOUNTER — Inpatient Hospital Stay (HOSPITAL_COMMUNITY)
Admission: EM | Admit: 2020-07-15 | Discharge: 2020-07-17 | DRG: 917 | Disposition: A | Discharge status: Home / Self Care | Payer: Medicaid Other | Attending: Family Medicine | Admitting: Family Medicine

## 2020-07-15 ENCOUNTER — Other Ambulatory Visit (HOSPITAL_COMMUNITY): Payer: Self-pay

## 2020-07-15 ENCOUNTER — Emergency Department (HOSPITAL_COMMUNITY): Payer: Medicaid Other

## 2020-07-15 ENCOUNTER — Encounter: Payer: Self-pay | Admitting: Gastroenterology

## 2020-07-15 VITALS — BP 110/70 | HR 99 | Ht 70.0 in | Wt 127.0 lb

## 2020-07-15 DIAGNOSIS — Z9981 Dependence on supplemental oxygen: Secondary | ICD-10-CM | POA: Diagnosis not present

## 2020-07-15 DIAGNOSIS — B181 Chronic viral hepatitis B without delta-agent: Secondary | ICD-10-CM

## 2020-07-15 DIAGNOSIS — I509 Heart failure, unspecified: Secondary | ICD-10-CM | POA: Diagnosis present

## 2020-07-15 DIAGNOSIS — F17210 Nicotine dependence, cigarettes, uncomplicated: Secondary | ICD-10-CM | POA: Diagnosis present

## 2020-07-15 DIAGNOSIS — E872 Acidosis: Secondary | ICD-10-CM | POA: Diagnosis present

## 2020-07-15 DIAGNOSIS — F1410 Cocaine abuse, uncomplicated: Secondary | ICD-10-CM | POA: Diagnosis present

## 2020-07-15 DIAGNOSIS — K219 Gastro-esophageal reflux disease without esophagitis: Secondary | ICD-10-CM | POA: Diagnosis present

## 2020-07-15 DIAGNOSIS — Z79899 Other long term (current) drug therapy: Secondary | ICD-10-CM | POA: Diagnosis not present

## 2020-07-15 DIAGNOSIS — J439 Emphysema, unspecified: Secondary | ICD-10-CM | POA: Diagnosis present

## 2020-07-15 DIAGNOSIS — R1084 Generalized abdominal pain: Secondary | ICD-10-CM

## 2020-07-15 DIAGNOSIS — I110 Hypertensive heart disease with heart failure: Secondary | ICD-10-CM | POA: Diagnosis present

## 2020-07-15 DIAGNOSIS — T401X1A Poisoning by heroin, accidental (unintentional), initial encounter: Secondary | ICD-10-CM | POA: Diagnosis present

## 2020-07-15 DIAGNOSIS — E875 Hyperkalemia: Secondary | ICD-10-CM

## 2020-07-15 DIAGNOSIS — I272 Pulmonary hypertension, unspecified: Secondary | ICD-10-CM | POA: Diagnosis present

## 2020-07-15 DIAGNOSIS — F1110 Opioid abuse, uncomplicated: Secondary | ICD-10-CM | POA: Diagnosis present

## 2020-07-15 DIAGNOSIS — J9601 Acute respiratory failure with hypoxia: Principal | ICD-10-CM

## 2020-07-15 DIAGNOSIS — T50904A Poisoning by unspecified drugs, medicaments and biological substances, undetermined, initial encounter: Secondary | ICD-10-CM

## 2020-07-15 DIAGNOSIS — Z20822 Contact with and (suspected) exposure to covid-19: Secondary | ICD-10-CM | POA: Diagnosis present

## 2020-07-15 DIAGNOSIS — F1193 Opioid use, unspecified with withdrawal: Secondary | ICD-10-CM

## 2020-07-15 DIAGNOSIS — R110 Nausea: Secondary | ICD-10-CM

## 2020-07-15 DIAGNOSIS — R4182 Altered mental status, unspecified: Secondary | ICD-10-CM | POA: Diagnosis present

## 2020-07-15 DIAGNOSIS — B1920 Unspecified viral hepatitis C without hepatic coma: Secondary | ICD-10-CM | POA: Diagnosis present

## 2020-07-15 DIAGNOSIS — J9602 Acute respiratory failure with hypercapnia: Secondary | ICD-10-CM

## 2020-07-15 DIAGNOSIS — D508 Other iron deficiency anemias: Secondary | ICD-10-CM

## 2020-07-15 DIAGNOSIS — T50901A Poisoning by unspecified drugs, medicaments and biological substances, accidental (unintentional), initial encounter: Secondary | ICD-10-CM

## 2020-07-15 LAB — RAPID URINE DRUG SCREEN, HOSP PERFORMED
Amphetamines: NOT DETECTED
Barbiturates: NOT DETECTED
Benzodiazepines: NOT DETECTED
Cocaine: POSITIVE — AB
Opiates: NOT DETECTED
Tetrahydrocannabinol: NOT DETECTED

## 2020-07-15 LAB — CBC WITH DIFFERENTIAL/PLATELET
Abs Immature Granulocytes: 0.05 10*3/uL (ref 0.00–0.07)
Basophils Absolute: 0 10*3/uL (ref 0.0–0.1)
Basophils Relative: 0 %
Eosinophils Absolute: 0 10*3/uL (ref 0.0–0.5)
Eosinophils Relative: 1 %
HCT: 52.8 % — ABNORMAL HIGH (ref 39.0–52.0)
Hemoglobin: 13.2 g/dL (ref 13.0–17.0)
Immature Granulocytes: 1 %
Lymphocytes Relative: 8 %
Lymphs Abs: 0.4 10*3/uL — ABNORMAL LOW (ref 0.7–4.0)
MCH: 22.2 pg — ABNORMAL LOW (ref 26.0–34.0)
MCHC: 25 g/dL — ABNORMAL LOW (ref 30.0–36.0)
MCV: 88.9 fL (ref 80.0–100.0)
Monocytes Absolute: 1 10*3/uL (ref 0.1–1.0)
Monocytes Relative: 19 %
Neutro Abs: 3.5 10*3/uL (ref 1.7–7.7)
Neutrophils Relative %: 71 %
Platelets: 146 10*3/uL — ABNORMAL LOW (ref 150–400)
RBC: 5.94 MIL/uL — ABNORMAL HIGH (ref 4.22–5.81)
RDW: 25.2 % — ABNORMAL HIGH (ref 11.5–15.5)
WBC: 5 10*3/uL (ref 4.0–10.5)
nRBC: 0 % (ref 0.0–0.2)

## 2020-07-15 LAB — TROPONIN I (HIGH SENSITIVITY)
Troponin I (High Sensitivity): 74 ng/L — ABNORMAL HIGH (ref <18)
Troponin I (High Sensitivity): 74 ng/L — ABNORMAL HIGH (ref <18)

## 2020-07-15 LAB — COMPREHENSIVE METABOLIC PANEL
ALT: 97 U/L — ABNORMAL HIGH (ref 0–44)
AST: 116 U/L — ABNORMAL HIGH (ref 15–41)
Albumin: 4.4 g/dL (ref 3.5–5.0)
Alkaline Phosphatase: 135 U/L — ABNORMAL HIGH (ref 38–126)
Anion gap: 10 (ref 5–15)
BUN: 38 mg/dL — ABNORMAL HIGH (ref 6–20)
CO2: 27 mmol/L (ref 22–32)
Calcium: 9.2 mg/dL (ref 8.9–10.3)
Chloride: 100 mmol/L (ref 98–111)
Creatinine, Ser: 1.28 mg/dL — ABNORMAL HIGH (ref 0.61–1.24)
GFR, Estimated: >60 mL/min (ref >60)
Glucose, Bld: 57 mg/dL — ABNORMAL LOW (ref 70–99)
Potassium: 6.3 mmol/L (ref 3.5–5.1)
Sodium: 137 mmol/L (ref 135–145)
Total Bilirubin: 0.4 mg/dL (ref 0.3–1.2)
Total Protein: 9.6 g/dL — ABNORMAL HIGH (ref 6.5–8.1)

## 2020-07-15 LAB — BLOOD GAS, ARTERIAL
Acid-Base Excess: 1.3 mmol/L (ref 0.0–2.0)
Acid-base deficit: 1 mmol/L (ref 0.0–2.0)
Bicarbonate: 31.8 mmol/L — ABNORMAL HIGH (ref 20.0–28.0)
Bicarbonate: 31 mmol/L — ABNORMAL HIGH (ref 20.0–28.0)
Drawn by: 30860
FIO2: 80
FIO2: 60
O2 Content: 15 L/min
O2 Saturation: 100 %
O2 Saturation: 98.3 %
Patient temperature: 98.6
pCO2 arterial: 111 mmHg (ref 32.0–48.0)
pCO2 arterial: 88.4 mmHg (ref 32.0–48.0)
pH, Arterial: 7.086 — CL (ref 7.350–7.450)
pH, Arterial: 7.171 — CL (ref 7.350–7.450)
pO2, Arterial: 327 mmHg — ABNORMAL HIGH (ref 83.0–108.0)
pO2, Arterial: 107 mmHg (ref 83.0–108.0)

## 2020-07-15 LAB — RESP PANEL BY RT-PCR (FLU A&B, COVID) ARPGX2
Influenza A by PCR: NEGATIVE
Influenza B by PCR: NEGATIVE
SARS Coronavirus 2 by RT PCR: NEGATIVE

## 2020-07-15 LAB — ACETAMINOPHEN LEVEL: Acetaminophen (Tylenol), Serum: <10 ug/mL — ABNORMAL LOW (ref 10–30)

## 2020-07-15 LAB — BRAIN NATRIURETIC PEPTIDE: B Natriuretic Peptide: 362.5 pg/mL — ABNORMAL HIGH (ref 0.0–100.0)

## 2020-07-15 LAB — SALICYLATE LEVEL: Salicylate Lvl: <7 mg/dL — ABNORMAL LOW (ref 7.0–30.0)

## 2020-07-15 LAB — ETHANOL: Alcohol, Ethyl (B): <10 mg/dL (ref <10)

## 2020-07-15 MED ORDER — INSULIN ASPART 100 UNIT/ML IJ SOLN
2.0000 [IU] | Freq: Once | INTRAMUSCULAR | Status: DC
  Filled 2020-07-15: qty 0.02

## 2020-07-15 MED ORDER — ONDANSETRON HCL 4 MG/2ML IJ SOLN: 4.0000 mg | Freq: Four times a day (QID) | INTRAMUSCULAR | Status: DC | PRN

## 2020-07-15 MED ORDER — SODIUM CHLORIDE 0.9 % IV BOLUS
1000.0000 mL | Freq: Once | INTRAVENOUS | Status: AC
  Administered 2020-07-15: 1000 mL via INTRAVENOUS

## 2020-07-15 MED ORDER — IPRATROPIUM BROMIDE HFA 17 MCG/ACT IN AERS
2.0000 | INHALATION_SPRAY | Freq: Once | RESPIRATORY_TRACT | Status: DC
  Filled 2020-07-15: qty 12.9

## 2020-07-15 MED ORDER — POLYETHYLENE GLYCOL 3350 17 G PO PACK: 17.0000 g | PACK | Freq: Every day | ORAL | Status: DC | PRN

## 2020-07-15 MED ORDER — CALCIUM GLUCONATE 10 % IV SOLN
1.0000 g | Freq: Once | INTRAVENOUS | Status: DC
  Filled 2020-07-15: qty 10

## 2020-07-15 MED ORDER — ALBUTEROL SULFATE HFA 108 (90 BASE) MCG/ACT IN AERS
2.0000 | INHALATION_SPRAY | Freq: Once | RESPIRATORY_TRACT | Status: AC
  Administered 2020-07-15: 2 via RESPIRATORY_TRACT
  Filled 2020-07-15: qty 6.7

## 2020-07-15 MED ORDER — SODIUM BICARBONATE 8.4 % IV SOLN
50.0000 meq | Freq: Once | INTRAVENOUS | Status: AC
  Administered 2020-07-15: 50 meq via INTRAVENOUS
  Filled 2020-07-15: qty 50

## 2020-07-15 MED ORDER — PANTOPRAZOLE SODIUM 40 MG IV SOLR
40.0000 mg | Freq: Every day | INTRAVENOUS | Status: DC
  Administered 2020-07-15: 40 mg via INTRAVENOUS
  Filled 2020-07-15: qty 40

## 2020-07-15 MED ORDER — LORAZEPAM 2 MG/ML IJ SOLN
0.5000 mg | Freq: Once | INTRAMUSCULAR | Status: AC
  Administered 2020-07-15: 0.5 mg via INTRAVENOUS
  Filled 2020-07-15: qty 1

## 2020-07-15 MED ORDER — CALCIUM GLUCONATE-NACL 1-0.675 GM/50ML-% IV SOLN
1.0000 g | Freq: Once | INTRAVENOUS | Status: AC
  Administered 2020-07-15: 1000 mg via INTRAVENOUS
  Filled 2020-07-15: qty 50

## 2020-07-15 MED ORDER — LIDOCAINE HCL URETHRAL/MUCOSAL 2 % EX GEL: 1.0000 "application " | Freq: Once | CUTANEOUS | Status: DC

## 2020-07-15 MED ORDER — ENOXAPARIN SODIUM 40 MG/0.4ML IJ SOSY
40.0000 mg | PREFILLED_SYRINGE | INTRAMUSCULAR | Status: DC
  Administered 2020-07-16 – 2020-07-17 (×2): 40 mg via SUBCUTANEOUS
  Filled 2020-07-15 (×2): qty 0.4

## 2020-07-15 MED ORDER — DOCUSATE SODIUM 100 MG PO CAPS: 100.0000 mg | ORAL_CAPSULE | Freq: Two times a day (BID) | ORAL | Status: DC | PRN

## 2020-07-15 MED ORDER — DEXTROSE 50 % IV SOLN
50.0000 mL | Freq: Once | INTRAVENOUS | Status: AC
  Administered 2020-07-15: 50 mL via INTRAVENOUS
  Filled 2020-07-15: qty 50

## 2020-07-15 MED ORDER — SODIUM CHLORIDE 0.9 % IV BOLUS
500.0000 mL | Freq: Once | INTRAVENOUS | Status: AC
  Administered 2020-07-15: 500 mL via INTRAVENOUS

## 2020-07-15 MED ORDER — LACTATED RINGERS IV SOLN: INTRAVENOUS | Status: DC

## 2020-07-15 MED ORDER — MAGNESIUM SULFATE 2 GM/50ML IV SOLN
2.0000 g | Freq: Once | INTRAVENOUS | Status: AC
  Administered 2020-07-15: 2 g via INTRAVENOUS
  Filled 2020-07-15: qty 50

## 2020-07-15 MED ORDER — LACTATED RINGERS IV BOLUS
500.0000 mL | Freq: Once | INTRAVENOUS | Status: AC
  Administered 2020-07-15: 500 mL via INTRAVENOUS

## 2020-07-15 NOTE — Patient Instructions (Addendum)
If you are age 51 or older, your body mass index should be between 23-30. Your Body mass index is 18.22 kg/m. If this is out of the aforementioned range listed, please consider follow up with your Primary Care Provider.  If you are age 17 or younger, your body mass index should be between 19-25. Your Body mass index is 18.22 kg/m. If this is out of the aformentioned range listed, please consider follow up with your Primary Care Provider.   We will send records to Infectious Disease. They will call you to schedule an appointment.  Your provider has requested that you go to the basement level for lab work before leaving today. Press "B" on the elevator. The lab is located at the first door on the left as you exit the elevator.  Due to recent changes in healthcare laws, you may see the results of your imaging and laboratory studies on MyChart before your provider has had a chance to review them.  We understand that in some cases there may be results that are confusing or concerning to you. Not all laboratory results come back in the same time frame and the provider may be waiting for multiple results in order to interpret others.  Please give [place] 48 hours in order for your provider to thoroughly review all the results before contacting the office for clarification of your results.   It was a pleasure to see you today!  Dr. Myrtie Neither

## 2020-07-15 NOTE — Progress Notes (Signed)
[facility] Progress Note  Chief Complaint: Generalized abdominal pain, iron deficiency anemia, chronic hepatitis  Subjective  History: David Short follows up in the office today after recent hospitalization.  I saw him in consultation in early April when he was admitted for altered mental status, hypoxia, pneumonia with pleural effusion and acute respiratory failure requiring intubation and ICU stay.  He had anasarca and profound hypoalbuminemia with severe protein calorie malnutrition in the setting of polysubstance abuse.  He had anemia, CT scan showed many findings including atherosclerosis, pulmonary hypertension with RV failure, COPD, splenomegaly with some retroperitoneal adenopathy of unclear significance.  It was difficult to determine if he might have cirrhosis in the setting of all of these findings.  I saw him for the first few days of his hospital stay before going off the consult service.  He was seen hematology consultation later that hospitalization due to the CT scan findings and question of possible lymphoproliferative disorder.  Further testing suggested chronic hepatitis B and C infection. Unfortunately, he was back in the ED on April 14 with an oxycodone overdose due to chronic back pain.  He was revived with Narcan.  Hematology follow up note from 4/12 reviewed - planning CT-PET scan to evaluate LAN.  Got IV iron and was prescribed 7 days of pain medicine.  David Short was in clinic today with his mother.  He reports feeling terrible with generalized musculoskeletal and ongoing abdominal pain.  He says he continues to use IV drugs because normal treat his pain, and he did so as recently as 2 days ago.  Twice during the visit he asked for prescription for oxycodone and I declined. He has chronic dyspnea he wears supplemental oxygen.  His appetite is poor.    (> 20 minutes late for appointment today)  ROS: Cardiovascular:  no chest pain Respiratory: Chronic dyspnea,  intermittent dry cough. Chronic pain syndrome as noted above  Today he is also shaking and he feels anxious  The patient's Past Medical, Family and Social History were reviewed and are on file in the EMR.  Objective:  Med list reviewed  Current Outpatient Medications:  .  albuterol (VENTOLIN HFA) 108 (90 Base) MCG/ACT inhaler, Inhale 2 puffs into the lungs every 6 (six) hours as needed for wheezing or shortness of breath., Disp: 8 g, Rfl: 2 .  carvedilol (COREG) 3.125 MG tablet, Take 1 tablet (3.125 mg total) by mouth 2 (two) times daily with a meal., Disp: 60 tablet, Rfl: 3 .  furosemide (LASIX) 20 MG tablet, Take 1 tablet (20 mg total) by mouth daily., Disp: 30 tablet, Rfl: 0 .  hydrocortisone (ANUSOL-HC) 25 MG suppository, Place 1 suppository (25 mg total) rectally 2 (two) times daily. For hemorrhoids, Disp: 100 suppository, Rfl: 0 .  pantoprazole (PROTONIX) 40 MG tablet, Take 1 tablet (40 mg total) by mouth daily., Disp: 30 tablet, Rfl: 1   Vital signs in last 24 hrs: Vitals:   07/15/20 0903  BP: 110/70  Pulse: 99  SpO2: 100%   Wt Readings from Last 3 Encounters:  07/15/20 127 lb (57.6 kg)  06/28/20 136 lb 6.4 oz (61.9 kg)  06/26/20 137 lb 2 oz (62.2 kg)    Physical Exam  He appears both acute and chronically ill, he is tremulous, in a wheelchair with a blanket on since he feels anxious and sick.  HEENT: sclera anicteric, oral mucosa moist without lesions, poor dentition  Neck: supple, no thyromegaly, JVD or lymphadenopathy  Cardiac: RRR without murmurs,  S1S2 heard, no peripheral edema  Pulm: Globally decreased breath sounds, no wheeze.  Abdomen: soft, generalized tenderness to light palpation of abdominal wall, with active bowel sounds.  Exam limited by tenderness and being in a wheelchair.  Skin; warm and dry, no jaundice or rash  Labs:  CBC Latest Ref Rng & Units 06/28/2020 06/26/2020 06/25/2020  WBC 4.0 - 10.5 K/uL 8.8 7.5 8.3  Hemoglobin 13.0 - 17.0 g/dL 2.9(B)  2.8(U) 1.3(K)  Hematocrit 39.0 - 52.0 % 35.5(L) 34.3(L) 34.0(L)  Platelets 150 - 400 K/uL 106(L) 73(L) 67(L)   CMP Latest Ref Rng & Units 06/28/2020 06/26/2020 06/25/2020  Glucose 70 - 99 mg/dL 76 440(N) 027(O)  BUN 6 - 20 mg/dL 53(G) 64(Q) 03(K)  Creatinine 0.61 - 1.24 mg/dL 7.42 5.95 6.38(V)  Sodium 135 - 145 mmol/L 140 138 139  Potassium 3.5 - 5.1 mmol/L 5.1 4.3 3.8  Chloride 98 - 111 mmol/L 99 99 89(L)  CO2 22 - 32 mmol/L 34(H) 31 41(H)  Calcium 8.9 - 10.3 mg/dL 5.6(E) 3.3(I) 9.5(J)  Total Protein 6.5 - 8.1 g/dL 7.6 - 6.5  Total Bilirubin 0.3 - 1.2 mg/dL 1.0 - 0.9  Alkaline Phos 38 - 126 U/L 86 - 80  AST 15 - 41 U/L 25 - 23  ALT 0 - 44 U/L 34 - 20   INR 1.4  Hepatitis B surface antigen positive, surface antibody negative, core antibody positive.  Hepatitis C antibody positive  HIV negative  IDA on 06/20/20 labs ___________________________________________ Radiologic studies: CT CHEST FINDINGS   Cardiovascular: Borderline cardiomegaly with trace pericardial effusion. Extensive three-vessel coronary artery atherosclerosis. The aortic root is suboptimally assessed given cardiac pulsation artifact. Atherosclerotic plaque within the normal caliber aorta. No acute luminal abnormality of the imaged aorta. No periaortic stranding or hemorrhage. Normal 3 vessel branching of the aortic arch. Proximal great vessels are mildly calcified but otherwise unremarkable. Central pulmonary arteries are enlarged. No large central filling defects are evident within the limitations of this non tailored examination of the pulmonary arteries.   Mediastinum/Nodes: Low-attenuation, borderline enlarged mediastinal and hilar lymph nodes are present including a 12 mm paratracheal lymph node (4/23), and 11 mm AP window lymph node (4/21) and a 12 mm left hilar lymph node (6/111). No acute abnormality of the trachea or esophagus. Thyroid gland and thoracic inlet are unremarkable.   Lungs/Pleura: Large  right and small left pleural effusion with areas of fairly uniformly enhancing atelectatic passive and dependent atelectasis though scattered fluid-filled and thickened airways are noted particularly towards the lung bases and some underlying degree of aspiration is not excluded. No pneumothorax. No other focal airspace opacity. No concerning pulmonary nodules or masses.   Musculoskeletal: No acute osseous abnormality or suspicious osseous lesion.   CT ABDOMEN PELVIS FINDINGS   Hepatobiliary: No concerning focal liver lesion. Diffuse periportal edema. Smooth liver surface contour. No significant gallbladder wall thickening. Pericholecystic fluid is likely related to redistributed ascites throughout the abdomen.   Pancreas: No pancreatic ductal dilatation or surrounding inflammatory changes.   Spleen: Slightly heterogeneous enhancement of the spleen may be related to contrast timing. Splenomegaly is present. Measuring up to 18 cm in craniocaudal dimension. Normal splenic cleft. No concerning splenic lesion.   Adrenals/Urinary Tract: Normal adrenal glands. Symmetrically delayed renal enhancement and excretion. No visible focal renal lesion. No urolithiasis or hydronephrosis. Urinary bladder is unremarkable for degree of distension.   Stomach/Bowel: Stomach and duodenum unremarkable. Slightly edematous appearance of the distal small bowel may be related to presence of  moderate volume ascites and presumed volume third-spacing. No colonic dilatation or wall thickening. No evidence of bowel obstruction. The appendix is not well visualized.   Vascular/Lymphatic: Atherosclerotic calcifications within the abdominal aorta and branch vessels. No aneurysm or ectasia. No enlarged abdominopelvic lymph nodes. Edematous appearing mesenteric nodes as well as some borderline enlarged though low-attenuation bilateral inguinal nodes. No other conspicuous enlarged abdominopelvic nodes.    Reproductive: Coarse eccentric calcification of the prostate. No concerning abnormalities of the prostate or seminal vesicles. A markedly edematous changes of the soft tissues of the included internal genitalia.   Other: Moderate volume ascites with additional edematous changes throughout the mesentery. Extensive circumferential body wall edema noted as well. Tiny fat containing umbilical hernia. No bowel containing hernias.   Musculoskeletal: Bony fusion across the L1-2 L3 levels may reflect sequela of prior infection or injury. Given that this is a new finding since 2013. No other acute or conspicuous osseous abnormalities. Bones of the pelvis appear intact and congruent.   IMPRESSION: 1. Extensive features of anasarca with circumferential body wall edema, pleural and pericardial effusions, periportal edema, moderate volume ascites and edematous changes of the mesentery. 2. Uniformly enhancing atelectatic passive and dependent atelectasis adjacent the basilar effusions though scattered fluid-filled and thickened airways are noted particularly towards the lung bases and some underlying degree of aspiration is not excluded. 3. Low-attenuation, borderline enlarged mediastinal hilar, and abdominopelvic nodes are nonspecific but could be reactive or edematous given features elsewhere though follow-up imaging may be warranted given the presence of splenomegaly as well. 4. Delayed renal enhancement bilaterally, nonspecific though should correlate for clinical of diminished renal function. 5. Bony fusion across the L1-2 L3 levels may reflect sequela of prior infection or injury. New finding since 2013. 6. Aortic Atherosclerosis (ICD10-I70.0). 7. Coronary artery calcifications are present. Please note that the presence of coronary artery calcium documents the presence of coronary artery disease, the severity of this disease and any potential stenosis cannot be assessed on this non-gated  CT examination.     Electronically Signed   By: Kreg Shropshire M.D.   On: 06/17/2020 23:07 CLINICAL DATA:  Epigastric pain Generalized abdominal pain, reassess for ascites. Diffuse abdominal pain. 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   EXAM: CT ABDOMEN AND PELVIS WITH CONTRAST   TECHNIQUE: Multidetector CT imaging of the abdomen and pelvis was performed using the standard protocol following bolus administration of intravenous contrast.   CONTRAST:  OMNIPAQUE IOHEXOL 300 MG/ML  SOLN   COMPARISON:  CT abdomen pelvis 06/17/2020, CT abdomen pelvis 03/01/2012   FINDINGS: Lower chest: Interval decrease in size of a small right pleural effusion. Bilateral lower lobe atelectasis versus scarring. Coronary artery calcifications.   Hepatobiliary: No focal liver abnormality. No gallstones, gallbladder wall thickening, or pericholecystic fluid. No biliary dilatation.   Pancreas: No focal lesion. Normal pancreatic contour. No surrounding inflammatory changes. No main pancreatic ductal dilatation.   Spleen: The spleen is enlarged measuring up to 18 cm (new from 2013). No focal splenic lesion.   Adrenals/Urinary Tract:   No adrenal nodule bilaterally.   Bilateral kidneys enhance symmetrically. No hydronephrosis. No hydroureter.   The urinary bladder is unremarkable.   Stomach/Bowel: PO contrast reaches the cecum. Stomach is within normal limits. No evidence of bowel wall thickening or dilatation. Stool throughout the colon. The appendix not definitely identified.   Vascular/Lymphatic: No abdominal aorta or iliac aneurysm. Mild atherosclerotic plaque of the aorta and its branches. Question prominent retroperitoneal lymph nodes without definite lymphadenopathy. No abdominal, pelvic, or  inguinal lymphadenopathy.   Reproductive: Prostate is unremarkable.   Other: No intraperitoneal free fluid. No intraperitoneal free gas. No organized fluid collection.   Musculoskeletal:   No  abdominal wall hernia or abnormality.   No suspicious lytic or blastic osseous lesions. No acute displaced fracture. Multilevel degenerative changes of the spine.   IMPRESSION: 1. Interval decrease in size of a small right pleural effusion. 2. Stable splenomegaly (18cm that is new from 2013). Question prominent retroperitoneal lymph nodes without definite lymphadenopathy. Findings suggestive of a lymphoproliferative disorder. Consider PET-CT for further evaluation. 3. Constipation. 4.  Aortic Atherosclerosis (ICD10-I70.0) - mild.     Electronically Signed   By: Tish FredericksonMorgane  Naveau M.D.   On: 06/24/2020 19:53     ____________________________________________ Other:   _____________________________________________ Assessment & Plan  Assessment: Encounter Diagnoses  Name Primary?  . Other iron deficiency anemia Yes  . Chronic hepatitis B (HCC)   . Generalized abdominal pain   . Nausea in adult   . Opioid use with withdrawal (HCC)    Is generalized abdominal pain appears part of an overall chronic pain syndrome in the setting of ongoing opioid abuse.  He also currently appears to be in opioid withdrawal.  He was apparently set up for primary care visit soon, I he and his mother say they do not know where to turn regarding detox or other form of drug rehab.  Given how he appears today, I recommended his [facility].    He was heme-negative on admission to the hospital last month when his iron deficiency anemia was discovered.  It could be that he has intermittent occult GI blood loss, so eventual EGD and colonoscopy is reasonable.  However, it might also be in the setting of generalized malnutrition and not necessarily from GI blood loss. He is currently in no condition for nonurgent endoscopic procedures.  The degree of his liver disease is not clear at present.  He did not have obvious cirrhosis on the CT scan, but he does have  splenomegaly that the hematology consultant feels is most likely from his liver disease.  He has cor pulmonale contributing as well.  David Short had ascites but also in the setting of anasarca and severe hypoalbuminemia from malnutrition.  His LFTs were normal.  When an upper endoscopy can be performed, if he has varices or portal gastropathy this would make the cirrhosis diagnosis.  He has chronic hepatitis B and possibly chronic hepatitis C as well.  AFP as well as further hepatitis B and C tests were done and referral made to infectious disease.  Clinic follow-up with us pending his clinical course regarding opioid use and withdrawal.   33 minutes were spent on this encounter (including chart review, history/exam, counseling/coordination of care, and documentation) > 50% of that time was spent on counseling and coordination of care.  Topics discussed included: Abdominal pain, his liver disease, opioid use, chronic viral hepatitis.  David Short

## 2020-07-15 NOTE — ED Provider Notes (Signed)
[facility] Note   CSN: [id number] Arrival date & time: 07/15/20  1736     History Chief Complaint  Patient presents with  . Drug Overdose    David Short is a 50 y.o. male history of CHF, COPD, known IV drug use, here presenting with respiratory distress.  Patient is a known IV drug user.  Patient called for overdose and shortness of breath.  Patient was noted to have sats in the 70s.  He was in tripod position had rhonchi and rails. Patient was given an 25 of Solu-Medrol prior to arrival.  Initially EMS was bagging him and then put him on a nonrebreather.  He was given Narcan prior to arrival and is very agitated on arrival.  Patient unable to give much history.  The history is provided by the EMS personnel.       Past Medical History:  Diagnosis Date  . CHF (congestive heart failure) (HCC)   . Chronic back pain   . COPD (chronic obstructive pulmonary disease) (HCC)   . IDA (iron deficiency anemia)   . IVDU (intravenous drug user)    heroin   . On home oxygen therapy   . Osteomyelitis (HCC)   . Splenomegaly   . Thrombocytopenia (HCC)   . Tobacco use     Patient Active Problem List   Diagnosis Date Noted  . Overdose 07/15/2020  . Lymphadenopathy 06/28/2020  . Abdominal pain 06/28/2020  . Other ascites   . Iron deficiency anemia   . CHF (congestive heart failure) (HCC) 06/18/2020  . Bacteremia due to Gram-negative bacteria 12/30/2018  . Vertebral osteomyelitis, acute-C5-C6 12/26/2018  . Neck pain 12/15/2018  . Malnutrition of moderate degree 12/15/2018  . S/P cervical spinal fusion 12/14/2018  . Aseptic meningitis 12/12/2018  . Accelerated hypertension 12/12/2018  . Sinus tachycardia 12/12/2018  . Thrombocytopenia (HCC) 12/12/2018    Past Surgical History:  Procedure Laterality Date  . ANTERIOR CERVICAL DECOMP/DISCECTOMY FUSION N/A 12/14/2018   Procedure: ANTERIOR CERVICAL DISCECTOMY FUSION CERVICAL FIVE-  CERVICAL SIX;  Surgeon: Tia AlertJones, Ann Groeneveld S, MD;  [facility];  Service: Neurosurgery;  Laterality: N/A;  . BACK SURGERY     states never had surgery  . FRACTURE SURGERY Left    elbow/wrist/ankle       Family History  Problem Relation Age of Onset  . Hypertension Sister   . Hypertension Brother   . Colon cancer Maternal Grandmother   . Prostate cancer Maternal Grandfather   . Colon cancer Maternal Grandfather     Social History   Tobacco Use  . Smoking status: Current Every Day Smoker    Packs/day: 1.00    Types: Cigarettes  . Smokeless tobacco: Never Used  Vaping Use  . Vaping Use: Never used  Substance Use Topics  . Alcohol use: Not Currently  . Drug use: Not Currently    Types: IV, Heroin    Comment: Last use was 06/17/20.  Used to take 1/2 gm per day.     Home Medications Prior to Admission medications   Medication Sig Start Date End Date Taking? Authorizing Provider  albuterol (VENTOLIN HFA) 108 (90 Base) MCG/ACT inhaler Inhale 2 puffs into the lungs every 6 (six) hours as needed for wheezing or shortness of breath. 06/26/20  Yes Rai, Ripudeep K, MD  carvedilol (COREG) 3.125 MG tablet Take 1 tablet (3.125 mg total) by mouth 2 (two) times daily with a meal. 06/26/20  Yes Rai, Ripudeep K, MD  furosemide (  LASIX) 20 MG tablet Take 1 tablet (20 mg total) by mouth daily. 06/26/20 07/26/20 Yes Rai, Ripudeep K, MD  hydrocortisone (ANUSOL-HC) 25 MG suppository Place 1 suppository (25 mg total) rectally 2 (two) times daily. For hemorrhoids 06/26/20  Yes Rai, Ripudeep K, MD  pantoprazole (PROTONIX) 40 MG tablet Take 1 tablet (40 mg total) by mouth daily. 06/26/20  Yes Rai, Ripudeep Kirtland Bouchard, MD    Allergies    Tylenol [acetaminophen]  Review of Systems   Review of Systems  All other systems reviewed and are negative.   Physical Exam Updated Vital Signs BP 98/63   Pulse 91   Temp 98.6 F (37 C)   Resp 18   SpO2 100%   Physical Exam Vitals and nursing note reviewed.   Constitutional:      Comments: Altered and agitated and moderate respiratory distress.  HENT:     Head: Normocephalic.     Nose: Nose normal.     Mouth/Throat:     Mouth: Mucous membranes are dry.  Eyes:     Extraocular Movements: Extraocular movements intact.     Pupils: Pupils are equal, round, and reactive to light.  Cardiovascular:     Rate and Rhythm: Regular rhythm. Tachycardia present.  Pulmonary:     Comments: Poor air movement.  Patient has diffuse wheezing as well.  Diminished in the bilateral bases Abdominal:     General: Abdomen is flat.     Palpations: Abdomen is soft.  Musculoskeletal:        General: Normal range of motion.     Cervical back: Normal range of motion and neck supple.  Skin:    General: Skin is warm.     Capillary Refill: Capillary refill takes less than 2 seconds.  Neurological:     Comments: Confused and altered and moving all extremities.  Psychiatric:     Comments: unable     ED Results / Procedures / Treatments   Labs (all labs ordered are listed, but only abnormal results are displayed) Labs Reviewed  CBC WITH DIFFERENTIAL/PLATELET - Abnormal; Notable for the following components:      Result Value   RBC 5.94 (*)    HCT 52.8 (*)    MCH 22.2 (*)    MCHC 25.0 (*)    RDW 25.2 (*)    Platelets 146 (*)    Lymphs Abs 0.4 (*)    All other components within normal limits  COMPREHENSIVE METABOLIC PANEL - Abnormal; Notable for the following components:   Potassium 6.3 (*)    Glucose, Bld 57 (*)    BUN 38 (*)    Creatinine, Ser 1.28 (*)    Total Protein 9.6 (*)    AST 116 (*)    ALT 97 (*)    Alkaline Phosphatase 135 (*)    All other components within normal limits  SALICYLATE LEVEL - Abnormal; Notable for the following components:   Salicylate Lvl <7.0 (*)    All other components within normal limits  ACETAMINOPHEN LEVEL - Abnormal; Notable for the following components:   Acetaminophen (Tylenol), Serum <10 (*)    All other  components within normal limits  RAPID URINE DRUG SCREEN, HOSP PERFORMED - Abnormal; Notable for the following components:   Cocaine POSITIVE (*)    All other components within normal limits  BRAIN NATRIURETIC PEPTIDE - Abnormal; Notable for the following components:   B Natriuretic Peptide 362.5 (*)    All other components within normal limits  BLOOD GAS,  ARTERIAL - Abnormal; Notable for the following components:   pH, Arterial 7.086 (*)    pCO2 arterial 111 (*)    pO2, Arterial 327 (*)    Bicarbonate 31.8 (*)    All other components within normal limits  BLOOD GAS, ARTERIAL - Abnormal; Notable for the following components:   pH, Arterial 7.171 (*)    pCO2 arterial 88.4 (*)    Bicarbonate 31.0 (*)    All other components within normal limits  TROPONIN I (HIGH SENSITIVITY) - Abnormal; Notable for the following components:   Troponin I (High Sensitivity) 74 (*)    All other components within normal limits  TROPONIN I (HIGH SENSITIVITY) - Abnormal; Notable for the following components:   Troponin I (High Sensitivity) 74 (*)    All other components within normal limits  RESP PANEL BY RT-PCR (FLU A&B, COVID) ARPGX2  ETHANOL  CBC  CREATININE, SERUM  CBC  BASIC METABOLIC PANEL  MAGNESIUM  PHOSPHORUS  BLOOD GAS, ARTERIAL    EKG EKG Interpretation  Date/Time:  Friday July 15 2020 22:10:19 EDT Ventricular Rate:  93 PR Interval:  154 QRS Duration: 121 QT Interval:  371 QTC Calculation: 462 R Axis:   270 Text Interpretation: Sinus rhythm RBBB and LAFB ST elev, probable normal early repol pattern QRS more narrow compared to earlier in the day Confirmed by Richardean Canal ([id number]) on 07/15/2020 10:12:44 PM   Radiology DG Chest Port 1 View  Result Date: 07/15/2020 CLINICAL DATA:  Shortness of breath EXAM: PORTABLE CHEST 1 VIEW COMPARISON:  06/24/2020 FINDINGS: Heart size is normal. Mediastinal shadows are normal. Underlying emphysema. No evidence of focal infiltrate, collapse or  effusion. Bony structures unremarkable. IMPRESSION: Emphysema. No active disease. Electronically Signed   By: Paulina Fusi M.D.   On: 07/15/2020 18:30    Procedures Procedures   CRITICAL CARE Performed by: Richardean Canal   Total critical care time: 40 minutes  Critical care time was exclusive of separately billable procedures and treating other patients.  Critical care was necessary to treat or prevent imminent or life-threatening deterioration.  Critical care was time spent personally by me on the following activities: development of treatment plan with patient and/or surrogate as well as nursing, discussions with consultants, evaluation of patient's response to treatment, examination of patient, obtaining history from patient or surrogate, ordering and performing treatments and interventions, ordering and review of laboratory studies, ordering and review of radiographic studies, pulse oximetry and re-evaluation of patient's condition.   Medications Ordered in ED Medications  ipratropium (ATROVENT HFA) inhaler 2 puff (has no administration in time range)  lidocaine (XYLOCAINE) 2 % jelly 1 application (1 application Urethral Not Given 07/15/20 2031)  docusate sodium (COLACE) capsule 100 mg (has no administration in time range)  polyethylene glycol (MIRALAX / GLYCOLAX) packet 17 g (has no administration in time range)  enoxaparin (LOVENOX) injection 40 mg (has no administration in time range)  ondansetron (ZOFRAN) injection 4 mg (has no administration in time range)  pantoprazole (PROTONIX) injection 40 mg (has no administration in time range)  magnesium sulfate IVPB 2 g 50 mL (0 g Intravenous Stopped 07/15/20 1852)  albuterol (VENTOLIN HFA) 108 (90 Base) MCG/ACT inhaler 2 puff (2 puffs Inhalation Given 07/15/20 1851)  LORazepam (ATIVAN) injection 0.5 mg (0.5 mg Intravenous Given 07/15/20 1749)  sodium chloride 0.9 % bolus 500 mL (0 mLs Intravenous Stopped 07/15/20 2030)  sodium bicarbonate  injection 50 mEq (50 mEq Intravenous Given 07/15/20 2023)  dextrose 50 % solution  50 mL (50 mLs Intravenous Given 07/15/20 2023)  calcium gluconate 1 g/ 50 mL sodium chloride IVPB (0 g Intravenous Stopped 07/15/20 2131)  sodium chloride 0.9 % bolus 1,000 mL (0 mLs Intravenous Stopped 07/15/20 2226)    ED Course  I have reviewed the triage vital signs and the nursing notes.  Pertinent labs & imaging results that were available during my care of the patient were reviewed by me and considered in my medical decision making (see chart for details).    MDM Rules/Calculators/A&P                         David Short is a 50 y.o. male here presenting with altered mental status and respiratory distress.  Patient has a history of COPD and is known to use IV heroin.  Patient is now agitated after given Narcan.  He had a recent admission required BiPAP.  Consider COPD versus CHF.  Will repeat labs and chest x-ray and BMP.  Will get ABG  7 pm ABG showed pH of 7.0 and a CO2 of 111.  Patient started on BiPAP.  10 PM  His repeat pH is 7.17 and CO2 is 88.  Patient is much more awake now.  UDS is positive for cocaine.  His initial potassium is 6.3.  He had widened QRS initially.  I gave him bicarb and calcium and his QRS now is narrow.  Chest x-ray showed emphysema and his BNP is 300.  Patient is more responsive but given pH is still 7.17, I consulted critical care.   11:21 PM Critical care at bedside and will admit patient.   Final Clinical Impression(s) / ED Diagnoses Final diagnoses:  Acute respiratory failure with hypoxia and hypercapnia (HCC)  Drug overdose, undetermined intent, initial encounter    Rx / DC Orders ED Discharge Orders    None       Charlynne Pander, MD 07/15/20 2321

## 2020-07-15 NOTE — ED Triage Notes (Signed)
Patient presents post drug overdose. When EMS arrived to the scene, the fire depart was ventilating the patient. After 2mg  of Narcan was given to the patient he became responsive. While in tripod position, rhonchi and rales could be heard. His sats were in the mid 70's until placed on a non re breather. Sats increased to 85-87%. 125 of solumedrol and 2g of magnesium were given by EMS.     EMS vitals: 110 HR 130 sys 213 CBG 85-87%

## 2020-07-15 NOTE — ED Notes (Signed)
Lynetta Mare (mother): [phone] Called mother back w/ update on son.

## 2020-07-15 NOTE — ED Notes (Signed)
Patient unable to sign MSE. 

## 2020-07-15 NOTE — H&P (Signed)
NAME:  David CHAVARIN, MRN:  [id number], DOB:  1971-01-20, LOS: 0 ADMISSION DATE:  07/15/2020,  History of Present Illness:  This is a 50 year old male that presented to the emergency room from home.  He was found unconscious unresponsive.  Patient was noted to be hypoxic with oxygen saturations in the 70s.  Patient was given 2 mg of Narcan with immediate response.  Patient became very agitated/belligerent.  Patient required Ativan to settle him down.  Since that time patient was placed on BiPAP for severe hypercapnic respiratory failure.  Patient has become more responsive since he was on BiPAP.  On exam the patient did awaken and nod yes that he had taken medication and overdosed.    Pertinent  Medical History  COPD PHTN Hep C CHF HTN IVDA Osteomyelitis Anemia Thrombocytopenia Splenomegaly   Significant Hospital Events: Including procedures, antibiotic start and stop dates in addition to other pertinent events   .     Objective   Blood pressure 98/63, pulse 91, temperature 98.6 F (37 C), resp. rate 18, SpO2 100 %.    Vent Mode: BIPAP;PCV FiO2 (%):  [60 %] 60 % Set Rate:  [14 bmp] 14 bmp PEEP:  [5 cmH20] 5 cmH20   Intake/Output Summary (Last 24 hours) at 07/15/2020 2327 Last data filed at 07/15/2020 2226 Gross per 24 hour  Intake 1600 ml  Output --  Net 1600 ml   There were no vitals filed for this visit.  Examination: General: no acute distress HENT: Atraumatic nomocephalic MMM no JVD Lungs: scattered wheezing intermittently. No rhonchi or rales Cardiovascular: RR no murmer rub or gallop Abdomen: soft Non distended +Bs diffusely tender- non focal.  No guarding or rigidity. No mass  Extremities: distal pulses intact x4 no cyanosis, no edema Neuro: Lethargic but does awaken to nod approp to some questions.  Follow commands x4 extrem. Pupils are equal.  Labs/imaging that I havepersonally reviewed  (right click and "Reselect all SmartList Selections" daily)      Assessment & Plan:  Acute Hypercapnic Resp Failure IVDA overdose COPD Chronic RV failure  PHTN Hep C  Plan: Admit to the ICU Continue the bipap 20/5.  Pt is able to protect his airway.   Repeat ABG is pending LR bolus now and then repeat the BMP- K If the patient does not improve from a respiratory point of view we will start Narcan infusion. Add Lokelma Monitor I's/O's. Best practice (right click and "Reselect all SmartList Selections" daily)  Diet:  NPO Pain/Anxiety/Delirium protocol (if indicated): No VAP protocol (if indicated): Not indicated DVT prophylaxis: LMWH GI prophylaxis: PPI Glucose control: Monitor blood sugar levels. Central venous access:  N/A Arterial line:  N/A Foley:  N/A Mobility:  bed rest  PT consulted: N/A Last date of multidisciplinary goals of care discussion []  Code Status:  full code Disposition: Admit to the ICU  Labs   CBC: Recent Labs  Lab 07/15/20 1744  WBC 5.0  NEUTROABS 3.5  HGB 13.2  HCT 52.8*  MCV 88.9  PLT 146*    Basic Metabolic Panel: Recent Labs  Lab 07/15/20 1744  NA 137  K 6.3*  CL 100  CO2 27  GLUCOSE 57*  BUN 38*  CREATININE 1.28*  CALCIUM 9.2   GFR: Estimated Creatinine Clearance: 56.3 mL/min (A) (by C-G formula based on SCr of 1.28 mg/dL (H)). Recent Labs  Lab 07/15/20 1744  WBC 5.0    Liver Function Tests: Recent Labs  Lab 07/15/20 1744  AST  116*  ALT 97*  ALKPHOS 135*  BILITOT 0.4  PROT 9.6*  ALBUMIN 4.4   No results for input(s): LIPASE, AMYLASE in the last 168 hours. No results for input(s): AMMONIA in the last 168 hours.  ABG    Component Value Date/Time   PHART 7.171 (LL) 07/15/2020 2100   PCO2ART 88.4 (HH) 07/15/2020 2100   PO2ART 107 07/15/2020 2100   HCO3 31.0 (H) 07/15/2020 2100   TCO2 30 06/17/2020 2356   ACIDBASEDEF 1.0 07/15/2020 1759   O2SAT 98.3 07/15/2020 2100     Coagulation Profile: No results for input(s): INR, PROTIME in the last 168  hours.  Cardiac Enzymes: No results for input(s): CKTOTAL, CKMB, CKMBINDEX, TROPONINI in the last 168 hours.  HbA1C: Hgb A1c MFr Bld  Date/Time Value Ref Range Status  06/26/2020 10:41 AM 5.6 4.8 - 5.6 % Final    Comment:    (NOTE) Pre diabetes:          5.7%-6.4%  Diabetes:              >6.4%  Glycemic control for   <7.0% adults with diabetes   06/23/2020 06:27 AM 5.6 4.8 - 5.6 % Final    Comment:    (NOTE) Pre diabetes:          5.7%-6.4%  Diabetes:              >6.4%  Glycemic control for   <7.0% adults with diabetes     CBG: No results for input(s): GLUCAP in the last 168 hours.  Review of Systems:   Unable to obtain secondary to neuro status  Past Medical History:  He,  has a past medical history of CHF (congestive heart failure) (HCC), Chronic back pain, COPD (chronic obstructive pulmonary disease) (HCC), IDA (iron deficiency anemia), IVDU (intravenous drug user), On home oxygen therapy, Osteomyelitis (HCC), Splenomegaly, Thrombocytopenia (HCC), and Tobacco use.   Surgical History:   Past Surgical History:  Procedure Laterality Date  . ANTERIOR CERVICAL DECOMP/DISCECTOMY FUSION N/A 12/14/2018   Procedure: ANTERIOR CERVICAL DISCECTOMY FUSION CERVICAL FIVE- CERVICAL SIX;  Surgeon: Tia Alert, MD;  Location: [facility] OR;  Service: Neurosurgery;  Laterality: N/A;  . BACK SURGERY     states never had surgery  . FRACTURE SURGERY Left    elbow/wrist/ankle     Social History:   reports that he has been smoking cigarettes. He has been smoking about 1.00 pack per day. He has never used smokeless tobacco. He reports previous alcohol use. He reports previous drug use. Drugs: IV and Heroin.   Family History:  His family history includes Colon cancer in his maternal grandfather and maternal grandmother; Hypertension in his brother and sister; Prostate cancer in his maternal grandfather.   Allergies Allergies  Allergen Reactions  . Tylenol [Acetaminophen] Hives      Home Medications  Prior to Admission medications   Medication Sig Start Date End Date Taking? Authorizing Provider  albuterol (VENTOLIN HFA) 108 (90 Base) MCG/ACT inhaler Inhale 2 puffs into the lungs every 6 (six) hours as needed for wheezing or shortness of breath. 06/26/20  Yes Rai, Ripudeep K, MD  carvedilol (COREG) 3.125 MG tablet Take 1 tablet (3.125 mg total) by mouth 2 (two) times daily with a meal. 06/26/20  Yes Rai, Ripudeep K, MD  furosemide (LASIX) 20 MG tablet Take 1 tablet (20 mg total) by mouth daily. 06/26/20 07/26/20 Yes Rai, Ripudeep K, MD  hydrocortisone (ANUSOL-HC) 25 MG suppository Place 1 suppository (25 mg total)  rectally 2 (two) times daily. For hemorrhoids 06/26/20  Yes Rai, Ripudeep K, MD  pantoprazole (PROTONIX) 40 MG tablet Take 1 tablet (40 mg total) by mouth daily. 06/26/20  Yes Rai, Delene Ruffini, MD     Critical care time: 

## 2020-07-16 DIAGNOSIS — T40601A Poisoning by unspecified narcotics, accidental (unintentional), initial encounter: Secondary | ICD-10-CM

## 2020-07-16 LAB — BASIC METABOLIC PANEL
Anion gap: 6 (ref 5–15)
Anion gap: 5 (ref 5–15)
Anion gap: 7 (ref 5–15)
BUN: 39 mg/dL — ABNORMAL HIGH (ref 6–20)
BUN: 37 mg/dL — ABNORMAL HIGH (ref 6–20)
BUN: 32 mg/dL — ABNORMAL HIGH (ref 6–20)
CO2: 27 mmol/L (ref 22–32)
CO2: 30 mmol/L (ref 22–32)
CO2: 28 mmol/L (ref 22–32)
Calcium: 8.6 mg/dL — ABNORMAL LOW (ref 8.9–10.3)
Calcium: 8.7 mg/dL — ABNORMAL LOW (ref 8.9–10.3)
Calcium: 9.1 mg/dL (ref 8.9–10.3)
Chloride: 105 mmol/L (ref 98–111)
Chloride: 105 mmol/L (ref 98–111)
Chloride: 102 mmol/L (ref 98–111)
Creatinine, Ser: 1.03 mg/dL (ref 0.61–1.24)
Creatinine, Ser: 0.97 mg/dL (ref 0.61–1.24)
Creatinine, Ser: 0.79 mg/dL (ref 0.61–1.24)
GFR, Estimated: >60 mL/min (ref >60)
GFR, Estimated: >60 mL/min (ref >60)
GFR, Estimated: >60 mL/min (ref >60)
Glucose, Bld: 80 mg/dL (ref 70–99)
Glucose, Bld: 94 mg/dL (ref 70–99)
Glucose, Bld: 195 mg/dL — ABNORMAL HIGH (ref 70–99)
Potassium: 6.9 mmol/L (ref 3.5–5.1)
Potassium: 6.9 mmol/L (ref 3.5–5.1)
Potassium: 5.5 mmol/L — ABNORMAL HIGH (ref 3.5–5.1)
Sodium: 138 mmol/L (ref 135–145)
Sodium: 140 mmol/L (ref 135–145)
Sodium: 137 mmol/L (ref 135–145)

## 2020-07-16 LAB — CBC
HCT: 43.1 % (ref 39.0–52.0)
HCT: 41 % (ref 39.0–52.0)
Hemoglobin: 10.4 g/dL — ABNORMAL LOW (ref 13.0–17.0)
Hemoglobin: 10.5 g/dL — ABNORMAL LOW (ref 13.0–17.0)
MCH: 22.5 pg — ABNORMAL LOW (ref 26.0–34.0)
MCH: 22.4 pg — ABNORMAL LOW (ref 26.0–34.0)
MCHC: 24.1 g/dL — ABNORMAL LOW (ref 30.0–36.0)
MCHC: 25.6 g/dL — ABNORMAL LOW (ref 30.0–36.0)
MCV: 93.1 fL (ref 80.0–100.0)
MCV: 87.6 fL (ref 80.0–100.0)
Platelets: 116 10*3/uL — ABNORMAL LOW (ref 150–400)
Platelets: 112 10*3/uL — ABNORMAL LOW (ref 150–400)
RBC: 4.63 MIL/uL (ref 4.22–5.81)
RBC: 4.68 MIL/uL (ref 4.22–5.81)
RDW: 24.7 % — ABNORMAL HIGH (ref 11.5–15.5)
RDW: 24.7 % — ABNORMAL HIGH (ref 11.5–15.5)
WBC: 5.6 10*3/uL (ref 4.0–10.5)
WBC: 5.4 10*3/uL (ref 4.0–10.5)
nRBC: 0 % (ref 0.0–0.2)
nRBC: 0 % (ref 0.0–0.2)

## 2020-07-16 LAB — TROPONIN I (HIGH SENSITIVITY)
Troponin I (High Sensitivity): 75 ng/L — ABNORMAL HIGH (ref <18)
Troponin I (High Sensitivity): 52 ng/L — ABNORMAL HIGH (ref <18)
Troponin I (High Sensitivity): 70 ng/L — ABNORMAL HIGH (ref <18)

## 2020-07-16 LAB — MAGNESIUM: Magnesium: 2.7 mg/dL — ABNORMAL HIGH (ref 1.7–2.4)

## 2020-07-16 LAB — PHOSPHORUS: Phosphorus: 5 mg/dL — ABNORMAL HIGH (ref 2.5–4.6)

## 2020-07-16 MED ORDER — NALOXONE HCL 4 MG/10ML IJ SOLN
0.2500 mg/h | INTRAVENOUS | Status: DC
  Administered 2020-07-16: 0.25 mg/h via INTRAVENOUS
  Filled 2020-07-16: qty 10

## 2020-07-16 MED ORDER — KETOROLAC TROMETHAMINE 15 MG/ML IJ SOLN
15.0000 mg | Freq: Four times a day (QID) | INTRAMUSCULAR | Status: DC | PRN
  Administered 2020-07-16 – 2020-07-17 (×3): 15 mg via INTRAVENOUS
  Filled 2020-07-16 (×3): qty 1

## 2020-07-16 MED ORDER — CHLORHEXIDINE GLUCONATE CLOTH 2 % EX PADS
6.0000 | MEDICATED_PAD | Freq: Every day | CUTANEOUS | Status: DC
  Administered 2020-07-16: 6 via TOPICAL

## 2020-07-16 MED ORDER — SODIUM ZIRCONIUM CYCLOSILICATE 10 G PO PACK
10.0000 g | PACK | Freq: Once | ORAL | Status: AC
  Administered 2020-07-16: 10 g via ORAL
  Filled 2020-07-16: qty 1

## 2020-07-16 MED ORDER — ASPIRIN 81 MG PO CHEW
81.0000 mg | CHEWABLE_TABLET | Freq: Every day | ORAL | Status: DC
  Administered 2020-07-16 – 2020-07-17 (×2): 81 mg via ORAL
  Filled 2020-07-16 (×2): qty 1

## 2020-07-16 MED ORDER — SODIUM ZIRCONIUM CYCLOSILICATE 5 G PO PACK
5.0000 g | PACK | Freq: Once | ORAL | Status: DC
  Filled 2020-07-16: qty 1

## 2020-07-16 MED ORDER — ORAL CARE MOUTH RINSE
15.0000 mL | Freq: Two times a day (BID) | OROMUCOSAL | Status: DC
  Administered 2020-07-16 (×2): 15 mL via OROMUCOSAL

## 2020-07-16 NOTE — Progress Notes (Signed)
CRITICAL VALUE STICKER  CRITICAL VALUE: Potassium 6.9  RECEIVER (on-site recipient of call): Ruffin Pyo, RN  DATE & TIME NOTIFIED: 07/16/2020 1007   NOTIFIED: Thedore Mins RN and Dr. Arsenio Loader  TIME OF NOTIFICATION: 0230  RESPONSE: Lokelma 10 gm PO X 1 now

## 2020-07-16 NOTE — Progress Notes (Signed)
   07/16/20 2231  Assess: MEWS Score  Temp 98.1 F (36.7 C)  BP 120/85  Pulse Rate (!) 111  Resp 18  SpO2 97 %  O2 Device Nasal Cannula  O2 Flow Rate (L/min) 2 L/min  Assess: MEWS Score  MEWS Temp 0  MEWS Systolic 0  MEWS Pulse 2  MEWS RR 0  MEWS LOC 0  MEWS Score 2  MEWS Score Color Yellow  Assess: if the MEWS score is Yellow or Red  Were vital signs taken at a resting state? Yes  Focused Assessment No change from prior assessment  Early Detection of Sepsis Score *See Row Information* Low  MEWS guidelines implemented *See Row Information* No, previously yellow, continue vital signs every 4 hours  Treat  MEWS Interventions Administered prn meds/treatments  Take Vital Signs  Increase Vital Sign Frequency  Yellow: Q 2hr X 2 then Q 4hr X 2, if remains yellow, continue Q 4hrs  Escalate  MEWS: Escalate Yellow: discuss with charge nurse/RN and consider discussing with provider and RRT  Notify: Charge Nurse/RN  Name of Charge Nurse/RN Notified Etola Mull RN  Date Charge Nurse/RN Notified 07/16/20  Time Charge Nurse/RN Notified 2231  Notify: Provider  Provider Name/Title Vassie Loll, MD  Date Provider Notified 07/16/20  Time Provider Notified  (previous shift RN notified to MD)  Notification Type Page  Notification Reason  (yellow mews)  Provider response No new orders  Date of Provider Response 07/16/20  Time of Provider Response 1649 (provider notified by previous shift RN)  Document  Patient Outcome Stabilized after interventions  Progress note created (see row info) Yes

## 2020-07-16 NOTE — Progress Notes (Addendum)
eLink Physician-Brief Progress Note Patient Name: David Short DOB: 1971-01-14 MRN: [id number]   Date of Service  07/16/2020  HPI/Events of Note  Patient c/o chest pain after drinking Lokelma. Troponin #1 = 74 and Troponin #2 = 75   eICU Interventions  PlanL 1.12 Lead EKG STAT. 2. Continue to cycle Troponin. 3. ASA 81 mg PO now and Q day.     Intervention Category Major Interventions: Other:  [name] Dennard Nip 07/16/2020, 3:27 AM

## 2020-07-16 NOTE — Progress Notes (Addendum)
NAME:  David Short, MRN:  [id number], DOB:  05-Jul-1970, LOS: 1 ADMISSION DATE:  07/15/2020,  History of Present Illness:  This is a 50 year old male that presented to the emergency room from home.  He was found unconscious unresponsive.  Patient was noted to be hypoxic with oxygen saturations in the 70s.  Patient was given 2 mg of Narcan with immediate response.  Patient became very agitated/belligerent.  Patient required Ativan to settle him down.  Since that time patient was placed on BiPAP for severe hypercapnic respiratory failure.  Patient has become more responsive since he was on BiPAP.  On exam the patient did awaken and nod yes that he had taken medication and overdosed.    Pertinent  Medical History  COPD PHTN Hep C CHF HTN IVDA Osteomyelitis Anemia Thrombocytopenia Splenomegaly   Significant Hospital Events: Including procedures, antibiotic start and stop dates in addition to other pertinent events   . 4/29 GI outpt visit     SUBJ : Can I eat? No chest pain or dyspnea On Narcan drip Complains of chest pain, mom at bedside states that sister did CPR  Objective   Blood pressure 130/80, pulse (!) 101, temperature 97.6 F (36.4 C), temperature source Oral, resp. rate (!) 30, weight 59.3 kg, SpO2 100 %.    Vent Mode: BIPAP;PCV FiO2 (%):  [60 %-100 %] 100 % Set Rate:  [14 bmp-20 bmp] 20 bmp PEEP:  [5 cmH20] 5 cmH20   Intake/Output Summary (Last 24 hours) at 07/16/2020 1201 Last data filed at 07/16/2020 1100 Gross per 24 hour  Intake 3691.72 ml  Output 2150 ml  Net 1541.72 ml   Filed Weights   07/16/20 0500  Weight: 59.3 kg    Examination: General: no acute distress, lying supine in bed HENT: Atraumatic nomocephalic MMM no JVD Lungs: Clear breath sounds bilateral Cardiovascular: S1-S2 regular, no murmur Abdomen: soft Non distended +Bs diffusely tender- non focal.  No guarding or rigidity. No mass  Extremities: distal pulses intact x4 no cyanosis, no  edema Neuro: Awake and interactive, follows commands, nonfocal  Labs/imaging that I havepersonally reviewed  (right click and "Reselect all SmartList Selections" daily)   Decreased hyperkalemia to 5.5, normal renal function, no leukocytosis  Assessment & Plan:  Acute Hypercapnic Resp Failure IVDA overdose -he admits to overdosing on heroin, used cocaine a few days ago, UDS positive for cocaine , note similar admits x2 within this month COPD Chronic RV failure  PHTN Hep C  Plan:  Discontinue Narcan drip  Chest pain -related to CPR, troponin trending down Use Toradol for pain  Hyperkalemia -seems to be related to acidosis and is resolving  Recurrent admits for drug overdose -discussed with mom, he has appointment on Tuesday morning for what seems to be a methadone clinic.  She is trying to get him an appointment with the pain clinic. We will  social work involved  He can transfer to the floor and to triad 5/1   Labs   CBC: Recent Labs  Lab 07/15/20 1744 07/16/20 0047 07/16/20 0235  WBC 5.0 5.6 5.4  NEUTROABS 3.5  --   --   HGB 13.2 10.4* 10.5*  HCT 52.8* 43.1 41.0  MCV 88.9 93.1 87.6  PLT 146* 116* 112*    Basic Metabolic Panel: Recent Labs  Lab 07/15/20 1744 07/16/20 0047 07/16/20 0235 07/16/20 0253 07/16/20 1040  NA 137 138  --  140 137  K 6.3* 6.9*  --  6.9* 5.5*  CL 100  105  --  105 102  CO2 27 27  --  30 28  GLUCOSE 57* 80  --  94 195*  BUN 38* 39*  --  37* 32*  CREATININE 1.28* 1.03  --  0.97 0.79  CALCIUM 9.2 8.6*  --  8.7* 9.1  MG  --   --  2.7*  --   --   PHOS  --   --  5.0*  --   --    GFR: Estimated Creatinine Clearance: 92.7 mL/min (by C-G formula based on SCr of 0.79 mg/dL). Recent Labs  Lab 07/15/20 1744 07/16/20 0047 07/16/20 0235  WBC 5.0 5.6 5.4    Liver Function Tests: Recent Labs  Lab 07/15/20 1744  AST 116*  ALT 97*  ALKPHOS 135*  BILITOT 0.4  PROT 9.6*  ALBUMIN 4.4   No results for input(s): LIPASE, AMYLASE in  the last 168 hours. No results for input(s): AMMONIA in the last 168 hours.  ABG    Component Value Date/Time   PHART 7.191 (LL) 07/15/2020 2319   PCO2ART 87.4 (HH) 07/15/2020 2319   PO2ART 106 07/15/2020 2319   HCO3 32.2 (H) 07/15/2020 2319   TCO2 30 06/17/2020 2356   ACIDBASEDEF 1.0 07/15/2020 1759   O2SAT 98.1 07/15/2020 2319      Cyril Mourning MD. FCCP. [facility] Pager : [phone]  If no response to pager , please call [phone] until 7 pm After 7:00 pm call Elink  [phone]   07/16/2020

## 2020-07-16 NOTE — Progress Notes (Signed)
Patient took [facility] and is now refusing BiPAP. Patient on RA and sats are 100%. He began to complain of chest pain an asked for pain medication. He was informed that MD was notified and that an EKG would be done. Upon telling patient that he had no order for pain medication, he became upset and started to yell louder "OH MY CHEST, IT HURTS."   E[facility] was called. Order for EKG. EG reads NSR. Patient agitated and O2 sats dropped. Patient placed back on BiPAP.

## 2020-07-16 NOTE — Progress Notes (Signed)
eLink Physician-Brief Progress Note Patient Name: David Short DOB: 10-Oct-1970 MRN: [id number]   Date of Service  07/16/2020  HPI/Events of Note  Hyperkalemia - K+ = 6.9.  eICU Interventions  Plan: 1. Lokelma 10 gm PO X 1 now. 2. Repeat BMP at 10 AM.     Intervention Category Major Interventions: Electrolyte abnormality - evaluation and management  Alexx Mcburney [place] 07/16/2020, 2:50 AM

## 2020-07-16 NOTE — Progress Notes (Signed)
K 6.9 result called in again by lab. Lokelma was given just a couple minutes ago. Elink aware.

## 2020-07-16 NOTE — Progress Notes (Signed)
Pt and visitor notified up transfer to 3E. Pt's belongings packed & pt placed in wheelchair. Pt stable upon leaving floor with nurse tech.

## 2020-07-16 NOTE — Progress Notes (Signed)
Foley removed at pt request; he kept tugging on foley and demanded that it be removed. Will monitor for retention.

## 2020-07-16 NOTE — Progress Notes (Signed)
Spoke to Dr. Vassie Loll about Narcan drip. Dr. Vassie Loll ordered Narcan dose to be cut in half. After running at half dose for 30 minutes, Narcan can be cut off.

## 2020-07-16 NOTE — Progress Notes (Signed)
Patient refusing BiPAP. Threatening to rip mask off. Patient was taken on BiPAP and was placed on 3L [place] for now. Holding O2 sats at 96%. RR and HR wnl.

## 2020-07-17 DIAGNOSIS — T50901A Poisoning by unspecified drugs, medicaments and biological substances, accidental (unintentional), initial encounter: Secondary | ICD-10-CM

## 2020-07-17 LAB — BASIC METABOLIC PANEL
Anion gap: 3 — ABNORMAL LOW (ref 5–15)
BUN: 36 mg/dL — ABNORMAL HIGH (ref 6–20)
CO2: 34 mmol/L — ABNORMAL HIGH (ref 22–32)
Calcium: 8.9 mg/dL (ref 8.9–10.3)
Chloride: 104 mmol/L (ref 98–111)
Creatinine, Ser: 0.93 mg/dL (ref 0.61–1.24)
GFR, Estimated: >60 mL/min (ref >60)
Glucose, Bld: 76 mg/dL (ref 70–99)
Potassium: 5 mmol/L (ref 3.5–5.1)
Sodium: 141 mmol/L (ref 135–145)

## 2020-07-17 LAB — MAGNESIUM: Magnesium: 2 mg/dL (ref 1.7–2.4)

## 2020-07-17 LAB — CBC
HCT: 35.7 % — ABNORMAL LOW (ref 39.0–52.0)
Hemoglobin: 9.4 g/dL — ABNORMAL LOW (ref 13.0–17.0)
MCH: 22.9 pg — ABNORMAL LOW (ref 26.0–34.0)
MCHC: 26.3 g/dL — ABNORMAL LOW (ref 30.0–36.0)
MCV: 87.1 fL (ref 80.0–100.0)
Platelets: 91 10*3/uL — ABNORMAL LOW (ref 150–400)
RBC: 4.1 MIL/uL — ABNORMAL LOW (ref 4.22–5.81)
RDW: 24.3 % — ABNORMAL HIGH (ref 11.5–15.5)
WBC: 4 10*3/uL (ref 4.0–10.5)
nRBC: 0 % (ref 0.0–0.2)

## 2020-07-17 LAB — PHOSPHORUS: Phosphorus: 2.5 mg/dL (ref 2.5–4.6)

## 2020-07-17 LAB — MRSA PCR SCREENING: MRSA by PCR: POSITIVE — AB

## 2020-07-17 MED ORDER — MUPIROCIN 2 % EX OINT
1.0000 "application " | TOPICAL_OINTMENT | Freq: Two times a day (BID) | CUTANEOUS | Status: DC
  Filled 2020-07-17: qty 22

## 2020-07-17 MED ORDER — CHLORHEXIDINE GLUCONATE CLOTH 2 % EX PADS: 6.0000 | MEDICATED_PAD | Freq: Every day | CUTANEOUS | Status: DC

## 2020-07-17 NOTE — Clinical Social Work Note (Signed)
Clinical Social Work Assessment  Patient Details  Name: David Short MRN: [id number] Date of Birth: Feb 28, 1971 Date of referral:  07/17/20               Reason for consult:  Substance Use/ETOH Abuse (Positive for Cocaine.)             Permission sought to share information with:  Case Manager,Family Supports Permission granted to share information::  Yes, Release of Information Signed  Name::     Mother - Lynetta Mare  Agency::  [facility]; [employer]; [facility] [employer]; [facility].  Relationship::  N/A  Contact Information:  N/A Housing/Transportation Living arrangements for the past 2 months:  Single Family Home Source of Information:  Parent (Encouraged to Apply for Social Security Disability, through the [facility] Department of Social Services.) Patient Interpreter Needed:  None Criminal Activity/Legal Involvement Pertinent to Current Situation/Hospitalization:  Yes Significant Relationships:  None Lives with:  Print production planner (Comment) (Aunt) Do you feel safe going back to the place where you live?  Yes Need for family participation in patient care:  Yes (Comment) Employment status:  Unemployed Insurance information:  Self Pay (Medicaid Pending) PT Recommendations:  Not assessed at this time Information / Referral to community resources:  Acute Rehab,[facility] (Comment Required),Inpatient Psychiatric Care (Comment Required),Residential Substance Abuse Treatment Options,Outpatient Substance Abuse Treatment Options,Support Groups,LME (Local Management Entity) Emotional Assessment Appearance:  Appears older than stated age,Disheveled,Malodorous Attitude/Demeanor/Rapport:  Angry,Avoidant,Inconsistent,Uncooperative Affect (typically observed):  Anxious,Agitated,Angry,Depressed,Frustrated Orientation:  Oriented to Self,Oriented to  Place,Oriented to Situation,Oriented to  Time Alcohol / Substance use:  Tobacco Use,Alcohol Use,Illicit Drugs Psych involvement (Current and /or in the community):  No (Comment) Discharge Needs  Concerns to be addressed:  Patient refuses services,Compliance Issues Concerns,Coping/Stress Concerns,Decision making concerns,Denies Needs/Concerns at this time,Discharge Planning Concerns,Employment/School Concerns,Financial / Insurance Concerns,Legal Concerns,Medication Concerns,Mental Health Concerns,Substance Abuse Concerns,Lack of Support Readmission within the last 30 days:   Yes Current discharge risk:  Inadequate Financial Supports,Legal Concerns,Psychiatric Illness,Substance Abuse,Terminally ill Barriers to Discharge:  No Barriers Identified   Renard Matter, LCSW 07/17/2020, 11:29 AM

## 2020-07-17 NOTE — Discharge Summary (Signed)
Physician Discharge Summary  David Short:[id number] DOB: [birth year] DOA: 07/15/2020  PCP: Default, Provider, MD  Admit date: 07/15/2020 Discharge date: 07/17/2020  Admitted From: Home Disposition: Home  Recommendations for Outpatient Follow-up:  1. Follow up with PCP in 1 week 2. Please follow up on the following pending results: None  Home Health: None Equipment/Devices: None  Discharge Condition: Guarded CODE STATUS: Full code Diet recommendation: Heart healthy   Brief/Interim Summary:  Admission HPI written by Kirtland Bouchard, MD   History of Present Illness: This is a 50 year old male that presented to the emergency room from home.  He was found unconscious unresponsive.  Patient was noted to be hypoxic with oxygen saturations in the 70s.  Patient was given 2 mg of Narcan with immediate response.  Patient became very agitated/belligerent.  Patient required Ativan to settle him down.  Since that time patient was placed on BiPAP for severe hypercapnic respiratory failure.  Patient has become more responsive since he was on BiPAP.  On exam the patient did awaken and nod yes that he had taken medication and overdosed.    Hospital course:  Acute respiratory failure with hypercapnia Secondary to heroin overdose.  Patient presented presented with an ABG significant for pH of 7.086 with associated PCO2 of 111, PO2 of 327, HCO3 of 31.8 while on nonrebreather.  Patient transition to BiPAP.  Narcan drip started for treatment of persistent opiate overdose and was finally discontinued prior to transition to the floor.  BiPAP also discontinued.  Patient stable on room air.  IV Drug abuse Heroin abuse Cocaine abuse Patient counseled. Discussed plan to start Suboxone and keep patient inpatient until outpatient pain management appointment on 5/3. Patient declined. Patient states he will go back to using heroin. Patient states he is in constant pain and uses heroin to treat.  Patient requested discharge home.  Hyperkalemia In relation to acidosis per critical care.  Resolved.  COPD No exacerbation. Continue albuterol.  Chronic RV failure Seen on previous Transthoracic Echocardiogram from 4/2. Noted.  Hepatitis C Noted.  GERD Continue Protonix  Discharge Diagnoses:  Active Problems:   Overdose    Discharge Instructions   Allergies as of 07/17/2020      Reactions   Tylenol [acetaminophen] Hives      Medication List    STOP taking these medications   carvedilol 3.125 MG tablet Commonly known as: COREG     TAKE these medications   albuterol 108 (90 Base) MCG/ACT inhaler Commonly known as: VENTOLIN HFA Inhale 2 puffs into the lungs every 6 (six) hours as needed for wheezing or shortness of breath.   furosemide 20 MG tablet Commonly known as: Lasix Take 1 tablet (20 mg total) by mouth daily.   hydrocortisone 25 MG suppository Commonly known as: ANUSOL-HC Place 1 suppository (25 mg total) rectally 2 (two) times daily. For hemorrhoids   pantoprazole 40 MG tablet Commonly known as: PROTONIX Take 1 tablet (40 mg total) by mouth daily.       Allergies  Allergen Reactions  . Tylenol [Acetaminophen] Hives    Consultations:  PCCM   Procedures/Studies: DG Chest 2 View  Result Date: 06/24/2020 CLINICAL DATA:  Right-sided pleural effusion EXAM: CHEST - 2 VIEW COMPARISON:  Prior chest x-ray 06/21/2020 FINDINGS: Small right layering pleural effusion. Stable cardiomegaly. Chronic bronchitic changes, interstitial prominence and hyperinflation. Interval resolution of pulmonary vascular congestion and mild edema. No pneumothorax. No acute osseous abnormality. IMPRESSION: 1. Small right pleural effusion. 2. Interval resolution  of pulmonary vascular congestion and mild interstitial edema. 3. Background pulmonary hyperinflation and chronic bronchitic changes suggest underlying COPD. Electronically Signed   By: Malachy MoanHeath  McCullough M.D.   On:  06/24/2020 07:28   CT Head Wo Contrast  Result Date: 06/17/2020 CLINICAL DATA:  Abnormal mental status, head trauma EXAM: CT HEAD WITHOUT CONTRAST TECHNIQUE: Contiguous axial images were obtained from the base of the skull through the vertex without intravenous contrast. COMPARISON:  MRI 12/13/2018 FINDINGS: Brain: No acute intracranial abnormality. Specifically, no hemorrhage, hydrocephalus, mass lesion, acute infarction, or significant intracranial injury. Vascular: No hyperdense vessel or unexpected calcification. Skull: No acute calvarial abnormality. Sinuses/Orbits: No acute findings. Mucosal thickening in the paranasal sinuses. Mastoid air cells clear. Other: None IMPRESSION: No acute intracranial abnormality. Electronically Signed   By: Charlett NoseKevin  Dover M.D.   On: 06/17/2020 22:48   CT Chest W Contrast  Result Date: 06/17/2020 CLINICAL DATA:  Shortness of breath for 2 weeks with altered mental status, swelling and edema in the upper and lower extremities, 50% on room air EXAM: CT CHEST, ABDOMEN, AND PELVIS WITH CONTRAST TECHNIQUE: Multidetector CT imaging of the chest, abdomen and pelvis was performed following the standard protocol during bolus administration of intravenous contrast. CONTRAST:  100mL OMNIPAQUE IOHEXOL 300 MG/ML  SOLN COMPARISON:  Radiograph 06/17/2020 renal ultrasound 03/17/2012, CT 03/01/2012 FINDINGS: CT CHEST FINDINGS Cardiovascular: Borderline cardiomegaly with trace pericardial effusion. Extensive three-vessel coronary artery atherosclerosis. The aortic root is suboptimally assessed given cardiac pulsation artifact. Atherosclerotic plaque within the normal caliber aorta. No acute luminal abnormality of the imaged aorta. No periaortic stranding or hemorrhage. Normal 3 vessel branching of the aortic arch. Proximal great vessels are mildly calcified but otherwise unremarkable. Central pulmonary arteries are enlarged. No large central filling defects are evident within the limitations of  this non tailored examination of the pulmonary arteries. Mediastinum/Nodes: Low-attenuation, borderline enlarged mediastinal and hilar lymph nodes are present including a 12 mm paratracheal lymph node (4/23), and 11 mm AP window lymph node (4/21) and a 12 mm left hilar lymph node (6/111). No acute abnormality of the trachea or esophagus. Thyroid gland and thoracic inlet are unremarkable. Lungs/Pleura: Large right and small left pleural effusion with areas of fairly uniformly enhancing atelectatic passive and dependent atelectasis though scattered fluid-filled and thickened airways are noted particularly towards the lung bases and some underlying degree of aspiration is not excluded. No pneumothorax. No other focal airspace opacity. No concerning pulmonary nodules or masses. Musculoskeletal: No acute osseous abnormality or suspicious osseous lesion. CT ABDOMEN PELVIS FINDINGS Hepatobiliary: No concerning focal liver lesion. Diffuse periportal edema. Smooth liver surface contour. No significant gallbladder wall thickening. Pericholecystic fluid is likely related to redistributed ascites throughout the abdomen. Pancreas: No pancreatic ductal dilatation or surrounding inflammatory changes. Spleen: Slightly heterogeneous enhancement of the spleen may be related to contrast timing. Splenomegaly is present. Measuring up to 18 cm in craniocaudal dimension. Normal splenic cleft. No concerning splenic lesion. Adrenals/Urinary Tract: Normal adrenal glands. Symmetrically delayed renal enhancement and excretion. No visible focal renal lesion. No urolithiasis or hydronephrosis. Urinary bladder is unremarkable for degree of distension. Stomach/Bowel: Stomach and duodenum unremarkable. Slightly edematous appearance of the distal small bowel may be related to presence of moderate volume ascites and presumed volume third-spacing. No colonic dilatation or wall thickening. No evidence of bowel obstruction. The appendix is not well  visualized. Vascular/Lymphatic: Atherosclerotic calcifications within the abdominal aorta and branch vessels. No aneurysm or ectasia. No enlarged abdominopelvic lymph nodes. Edematous appearing mesenteric nodes as well  as some borderline enlarged though low-attenuation bilateral inguinal nodes. No other conspicuous enlarged abdominopelvic nodes. Reproductive: Coarse eccentric calcification of the prostate. No concerning abnormalities of the prostate or seminal vesicles. A markedly edematous changes of the soft tissues of the included internal genitalia. Other: Moderate volume ascites with additional edematous changes throughout the mesentery. Extensive circumferential body wall edema noted as well. Tiny fat containing umbilical hernia. No bowel containing hernias. Musculoskeletal: Bony fusion across the L1-2 L3 levels may reflect sequela of prior infection or injury. Given that this is a new finding since 2013. No other acute or conspicuous osseous abnormalities. Bones of the pelvis appear intact and congruent. IMPRESSION: 1. Extensive features of anasarca with circumferential body wall edema, pleural and pericardial effusions, periportal edema, moderate volume ascites and edematous changes of the mesentery. 2. Uniformly enhancing atelectatic passive and dependent atelectasis adjacent the basilar effusions though scattered fluid-filled and thickened airways are noted particularly towards the lung bases and some underlying degree of aspiration is not excluded. 3. Low-attenuation, borderline enlarged mediastinal hilar, and abdominopelvic nodes are nonspecific but could be reactive or edematous given features elsewhere though follow-up imaging may be warranted given the presence of splenomegaly as well. 4. Delayed renal enhancement bilaterally, nonspecific though should correlate for clinical of diminished renal function. 5. Bony fusion across the L1-2 L3 levels may reflect sequela of prior infection or injury. New  finding since 2013. 6. Aortic Atherosclerosis (ICD10-I70.0). 7. Coronary artery calcifications are present. Please note that the presence of coronary artery calcium documents the presence of coronary artery disease, the severity of this disease and any potential stenosis cannot be assessed on this non-gated CT examination. Electronically Signed   By: Kreg Shropshire M.D.   On: 06/17/2020 23:07   CT ABDOMEN PELVIS W CONTRAST  Result Date: 06/24/2020 CLINICAL DATA:  Epigastric pain Generalized abdominal pain, reassess for ascites. Diffuse abdominal pain. 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 EXAM: CT ABDOMEN AND PELVIS WITH CONTRAST TECHNIQUE: Multidetector CT imaging of the abdomen and pelvis was performed using the standard protocol following bolus administration of intravenous contrast. CONTRAST:  OMNIPAQUE IOHEXOL 300 MG/ML  SOLN COMPARISON:  CT abdomen pelvis 06/17/2020, CT abdomen pelvis 03/01/2012 FINDINGS: Lower chest: Interval decrease in size of a small right pleural effusion. Bilateral lower lobe atelectasis versus scarring. Coronary artery calcifications. Hepatobiliary: No focal liver abnormality. No gallstones, gallbladder wall thickening, or pericholecystic fluid. No biliary dilatation. Pancreas: No focal lesion. Normal pancreatic contour. No surrounding inflammatory changes. No main pancreatic ductal dilatation. Spleen: The spleen is enlarged measuring up to 18 cm (new from 2013). No focal splenic lesion. Adrenals/Urinary Tract: No adrenal nodule bilaterally. Bilateral kidneys enhance symmetrically. No hydronephrosis. No hydroureter. The urinary bladder is unremarkable. Stomach/Bowel: PO contrast reaches the cecum. Stomach is within normal limits. No evidence of bowel wall thickening or dilatation. Stool throughout the colon. The appendix not definitely identified. Vascular/Lymphatic: No abdominal aorta or iliac aneurysm. Mild atherosclerotic plaque of the aorta and its branches. Question prominent  retroperitoneal lymph nodes without definite lymphadenopathy. No abdominal, pelvic, or inguinal lymphadenopathy. Reproductive: Prostate is unremarkable. Other: No intraperitoneal free fluid. No intraperitoneal free gas. No organized fluid collection. Musculoskeletal: No abdominal wall hernia or abnormality. No suspicious lytic or blastic osseous lesions. No acute displaced fracture. Multilevel degenerative changes of the spine. IMPRESSION: 1. Interval decrease in size of a small right pleural effusion. 2. Stable splenomegaly (18cm that is new from 2013). Question prominent retroperitoneal lymph nodes without definite lymphadenopathy. Findings suggestive of a lymphoproliferative disorder. Consider PET-CT  for further evaluation. 3. Constipation. 4.  Aortic Atherosclerosis (ICD10-I70.0) - mild. Electronically Signed   By: Tish Frederickson M.D.   On: 06/24/2020 19:53   CT ABDOMEN PELVIS W CONTRAST  Result Date: 06/17/2020 CLINICAL DATA:  Shortness of breath for 2 weeks with altered mental status, swelling and edema in the upper and lower extremities, 50% on room air EXAM: CT CHEST, ABDOMEN, AND PELVIS WITH CONTRAST TECHNIQUE: Multidetector CT imaging of the chest, abdomen and pelvis was performed following the standard protocol during bolus administration of intravenous contrast. CONTRAST:  OMNIPAQUE IOHEXOL 300 MG/ML  SOLN COMPARISON:  Radiograph 06/17/2020 renal ultrasound 03/17/2012, CT 03/01/2012 FINDINGS: CT CHEST FINDINGS Cardiovascular: Borderline cardiomegaly with trace pericardial effusion. Extensive three-vessel coronary artery atherosclerosis. The aortic root is suboptimally assessed given cardiac pulsation artifact. Atherosclerotic plaque within the normal caliber aorta. No acute luminal abnormality of the imaged aorta. No periaortic stranding or hemorrhage. Normal 3 vessel branching of the aortic arch. Proximal great vessels are mildly calcified but otherwise unremarkable. Central pulmonary arteries  are enlarged. No large central filling defects are evident within the limitations of this non tailored examination of the pulmonary arteries. Mediastinum/Nodes: Low-attenuation, borderline enlarged mediastinal and hilar lymph nodes are present including a 12 mm paratracheal lymph node (4/23), and 11 mm AP window lymph node (4/21) and a 12 mm left hilar lymph node (6/111). No acute abnormality of the trachea or esophagus. Thyroid gland and thoracic inlet are unremarkable. Lungs/Pleura: Large right and small left pleural effusion with areas of fairly uniformly enhancing atelectatic passive and dependent atelectasis though scattered fluid-filled and thickened airways are noted particularly towards the lung bases and some underlying degree of aspiration is not excluded. No pneumothorax. No other focal airspace opacity. No concerning pulmonary nodules or masses. Musculoskeletal: No acute osseous abnormality or suspicious osseous lesion. CT ABDOMEN PELVIS FINDINGS Hepatobiliary: No concerning focal liver lesion. Diffuse periportal edema. Smooth liver surface contour. No significant gallbladder wall thickening. Pericholecystic fluid is likely related to redistributed ascites throughout the abdomen. Pancreas: No pancreatic ductal dilatation or surrounding inflammatory changes. Spleen: Slightly heterogeneous enhancement of the spleen may be related to contrast timing. Splenomegaly is present. Measuring up to 18 cm in craniocaudal dimension. Normal splenic cleft. No concerning splenic lesion. Adrenals/Urinary Tract: Normal adrenal glands. Symmetrically delayed renal enhancement and excretion. No visible focal renal lesion. No urolithiasis or hydronephrosis. Urinary bladder is unremarkable for degree of distension. Stomach/Bowel: Stomach and duodenum unremarkable. Slightly edematous appearance of the distal small bowel may be related to presence of moderate volume ascites and presumed volume third-spacing. No colonic  dilatation or wall thickening. No evidence of bowel obstruction. The appendix is not well visualized. Vascular/Lymphatic: Atherosclerotic calcifications within the abdominal aorta and branch vessels. No aneurysm or ectasia. No enlarged abdominopelvic lymph nodes. Edematous appearing mesenteric nodes as well as some borderline enlarged though low-attenuation bilateral inguinal nodes. No other conspicuous enlarged abdominopelvic nodes. Reproductive: Coarse eccentric calcification of the prostate. No concerning abnormalities of the prostate or seminal vesicles. A markedly edematous changes of the soft tissues of the included internal genitalia. Other: Moderate volume ascites with additional edematous changes throughout the mesentery. Extensive circumferential body wall edema noted as well. Tiny fat containing umbilical hernia. No bowel containing hernias. Musculoskeletal: Bony fusion across the L1-2 L3 levels may reflect sequela of prior infection or injury. Given that this is a new finding since 2013. No other acute or conspicuous osseous abnormalities. Bones of the pelvis appear intact and congruent. IMPRESSION: 1. Extensive features of  anasarca with circumferential body wall edema, pleural and pericardial effusions, periportal edema, moderate volume ascites and edematous changes of the mesentery. 2. Uniformly enhancing atelectatic passive and dependent atelectasis adjacent the basilar effusions though scattered fluid-filled and thickened airways are noted particularly towards the lung bases and some underlying degree of aspiration is not excluded. 3. Low-attenuation, borderline enlarged mediastinal hilar, and abdominopelvic nodes are nonspecific but could be reactive or edematous given features elsewhere though follow-up imaging may be warranted given the presence of splenomegaly as well. 4. Delayed renal enhancement bilaterally, nonspecific though should correlate for clinical of diminished renal function. 5. Bony  fusion across the L1-2 L3 levels may reflect sequela of prior infection or injury. New finding since 2013. 6. Aortic Atherosclerosis (ICD10-I70.0). 7. Coronary artery calcifications are present. Please note that the presence of coronary artery calcium documents the presence of coronary artery disease, the severity of this disease and any potential stenosis cannot be assessed on this non-gated CT examination. Electronically Signed   By: Kreg Shropshire M.D.   On: 06/17/2020 23:07   DG Chest Port 1 View  Result Date: 07/15/2020 CLINICAL DATA:  Shortness of breath EXAM: PORTABLE CHEST 1 VIEW COMPARISON:  06/24/2020 FINDINGS: Heart size is normal. Mediastinal shadows are normal. Underlying emphysema. No evidence of focal infiltrate, collapse or effusion. Bony structures unremarkable. IMPRESSION: Emphysema. No active disease. Electronically Signed   By: Paulina Fusi M.D.   On: 07/15/2020 18:30   DG Chest Port 1 View  Result Date: 06/21/2020 CLINICAL DATA:  Shortness of breath. EXAM: PORTABLE CHEST 1 VIEW COMPARISON:  Chest x-ray 06/20/2020.  Chest CT 06/17/2020. FINDINGS: Heart size upper normal. Vascular congestion again noted with diffuse interstitial opacity. Hazy opacity at the right base compatible with known pleural effusion seen on previous CT. The visualized bony structures of the thorax show no acute abnormality. Telemetry leads overlie the chest. IMPRESSION: No substantial change since chest x-ray from yesterday. Electronically Signed   By: Kennith Center M.D.   On: 06/21/2020 13:30   DG Chest Port 1 View  Result Date: 06/20/2020 CLINICAL DATA:  50 year old male with chest pain and shortness of breath. Smoker. EXAM: PORTABLE CHEST 1 VIEW COMPARISON:  CT Chest, Abdomen, and Pelvis 06/17/2020 and earlier. FINDINGS: Portable AP semi upright view at 0901 hours. Significantly regressed right pleural effusion. Small residual suspected. Decreased but not resolved bilateral perihilar interstitial opacity slightly  greater on the right. Normal cardiac size and mediastinal contours. Visualized tracheal air column is within normal limits. No pneumothorax. No areas of worsening ventilation. Paucity of bowel gas in the upper abdomen. No acute osseous abnormality identified. Prior cervical ACDF. IMPRESSION: 1. Regressed but not resolved right pleural effusion and bilateral perihilar opacity, most resembling vascular congestion. 2. No new cardiopulmonary abnormality. Electronically Signed   By: Odessa Fleming M.D.   On: 06/20/2020 09:16   DG Chest Portable 1 View  Result Date: 06/17/2020 CLINICAL DATA:  Shortness of breath EXAM: PORTABLE CHEST 1 VIEW COMPARISON:  12/12/2018 FINDINGS: Shallow inspiration. Small to moderate right pleural effusion with consolidation in the right lung base. Linear atelectasis or infiltration in the left base. Cardiac enlargement. No vascular congestion. Calcification of the aorta. IMPRESSION: Small to moderate right pleural effusion with consolidation in the right lung base. Linear atelectasis or infiltration in the left base. Electronically Signed   By: Burman Nieves M.D.   On: 06/17/2020 20:18   ECHOCARDIOGRAM COMPLETE  Result Date: 06/18/2020    ECHOCARDIOGRAM REPORT   Patient Name:  David Short Date of Exam: 06/18/2020 Medical Rec #:  [id number]         Height:       72.0 in Accession #:    [id number]        Weight:       166.0 lb Date of Birth:  September 12, 1970         BSA:          1.968 m Patient Age:    49 years          BP:           115/71 mmHg Patient Gender: M                 HR:           101 bpm. Exam Location:  Inpatient Procedure: 2D Echo, Color Doppler and Cardiac Doppler Indications:    I51.7 Cardiomegaly; Acute Respiratory Distress R06.03  History:        Patient has no prior history of Echocardiogram examinations.  Sonographer:    Tiffany Dance Referring Phys: [id number] Charlotte Sanes IMPRESSIONS  1. Right ventricular systolic function is normal. The right ventricular size is  severely enlarged. There is moderately elevated pulmonary artery systolic pressure. The estimated right ventricular systolic pressure is 52.5 mmHg. Right ventricular apex not well visualized. Coronary sinus dilation consistent with right ventricular overload.  2. Right atrial size was severely dilated.  3. A small pericardial effusion is present. The pericardial effusion is circumferential. There is no evidence of increased pericardial pressure.  4. Left ventricular ejection fraction, by estimation, is 65 to 70%. The left ventricle has normal function. The left ventricle has no regional wall motion abnormalities. There is mild concentric left ventricular hypertrophy. Left ventricular diastolic parameters were normal. There is the interventricular septum is flattened in systole, consistent with right ventricular pressure overload.  5. Tricuspid valve regurgitation is moderate and eccentric best seen in A4c view .  6. Left atrial size was mildly dilated.  7. The mitral valve is normal in structure. Trivial mitral valve regurgitation.  8. The aortic valve is tricuspid. Aortic valve regurgitation is not visualized. No aortic stenosis is present.  9. The inferior vena cava is dilated in size with <50% respiratory variability, suggesting right atrial pressure of 15 mmHg. Comparison(s): No prior Echocardiogram. FINDINGS  Left Ventricle: Left ventricular ejection fraction, by estimation, is 65 to 70%. The left ventricle has normal function. The left ventricle has no regional wall motion abnormalities. The left ventricular internal cavity size was normal in size. There is  mild concentric left ventricular hypertrophy. The interventricular septum is flattened in systole, consistent with right ventricular pressure overload. Left ventricular diastolic parameters were normal. Right Ventricle: The right ventricular size is severely enlarged. No increase in right ventricular wall thickness. Right ventricular systolic function is  normal. There is moderately elevated pulmonary artery systolic pressure. The tricuspid regurgitant velocity is 3.06 m/s, and with an assumed right atrial pressure of 15 mmHg, the estimated right ventricular systolic pressure is 52.5 mmHg. Left Atrium: Left atrial size was mildly dilated. Right Atrium: Right atrial size was severely dilated. Pericardium: A small pericardial effusion is present. The pericardial effusion is circumferential. There is no evidence of cardiac tamponade. Mitral Valve: The mitral valve is normal in structure. Trivial mitral valve regurgitation. Tricuspid Valve: The tricuspid valve is normal in structure. Tricuspid valve regurgitation is moderate . No evidence of tricuspid stenosis. Aortic Valve: The aortic valve is tricuspid. Aortic valve regurgitation  is not visualized. No aortic stenosis is present. Pulmonic Valve: Discrepancy between Spectral Doppler and Color Doppler. The pulmonic valve was grossly normal. Pulmonic valve regurgitation is not visualized. No evidence of pulmonic stenosis. Aorta: The aortic root and ascending aorta are structurally normal, with no evidence of dilitation. Venous: The inferior vena cava is dilated in size with less than 50% respiratory variability, suggesting right atrial pressure of 15 mmHg. IAS/Shunts: The atrial septum is grossly normal.  LEFT VENTRICLE PLAX 2D LVIDd:         4.80 cm  Diastology LVIDs:         2.30 cm  LV e' medial:    11.00 cm/s LV PW:         1.30 cm  LV E/e' medial:  8.5 LV IVS:        1.10 cm  LV e' lateral:   10.30 cm/s LVOT diam:     2.00 cm  LV E/e' lateral: 9.0 LV SV:         75 LV SV Index:   38 LVOT Area:     3.14 cm  RIGHT VENTRICLE             IVC RV Basal diam:  3.80 cm     IVC diam: 3.30 cm RV Mid diam:    2.80 cm RV S prime:     16.50 cm/s TAPSE (M-mode): 1.9 cm LEFT ATRIUM              Index       RIGHT ATRIUM           Index LA diam:        5.60 cm  2.84 cm/m  RA Area:     28.50 cm LA Vol (A2C):   122.0 ml 61.98 ml/m  RA Volume:   111.00 ml 56.39 ml/m LA Vol (A4C):   46.7 ml  23.73 ml/m LA Biplane Vol: 75.8 ml  38.51 ml/m  AORTIC VALVE LVOT Vmax:   123.00 cm/s LVOT Vmean:  82.400 cm/s LVOT VTI:    0.239 m  AORTA Ao Root diam: 3.60 cm Ao Asc diam:  3.30 cm MITRAL VALVE               TRICUSPID VALVE MV Area (PHT): 3.48 cm    TR Peak grad:   37.5 mmHg MV Decel Time: 218 msec    TR Vmax:        306.00 cm/s MV E velocity: 93.00 cm/s MV A velocity: 88.10 cm/s  SHUNTS MV E/A ratio:  1.06        Systemic VTI:  0.24 m                            Systemic Diam: 2.00 cm Riley Lam MD Electronically signed by Riley Lam MD Signature Date/Time: 06/18/2020/4:36:14 PM    Final       Subjective: No issues this morning. No dyspnea. Adamant about discharge today. Does not want to wait until his outpatient pain management clinic visit this upcoming Tuesday.  Discharge Exam: Vitals:   07/17/20 0227 07/17/20 0604  BP: (!) 153/99 (!) 140/98  Pulse: (!) 107 95  Resp: 18 18  Temp: 98.1 F (36.7 C) 98.1 F (36.7 C)  SpO2: 100% 99%   Vitals:   07/16/20 1951 07/16/20 2231 07/17/20 0227 07/17/20 0604  BP: 137/83 120/85 (!) 153/99 (!) 140/98  Pulse: (!) 110 (!) 111 (!) 107 95  Resp: 20 18 18 18   Temp: 98 F (36.7 C) 98.1 F (36.7 C) 98.1 F (36.7 C) 98.1 F (36.7 C)  TempSrc: Oral Oral Oral Oral  SpO2: 94% 97% 100% 99%  Weight:        General: Pt is alert, awake, not in acute distress Cardiovascular: RRR, S1/S2 +, no rubs, no gallops Respiratory: CTA bilaterally, no wheezing, no rhonchi Abdominal: Soft, NT, ND, bowel sounds + Extremities: no edema, no cyanosis    The results of significant diagnostics from this hospitalization (including imaging, microbiology, ancillary and laboratory) are listed below for reference.     Microbiology: Recent Results (from the past 240 hour(s))  Resp Panel by RT-PCR (Flu A&B, Covid) Nasopharyngeal Swab     Status: None   Collection Time: 07/15/20  6:47 PM    Specimen: Nasopharyngeal Swab; Nasopharyngeal(NP) swabs in vial transport medium  Result Value Ref Range Status   SARS Coronavirus 2 by RT PCR NEGATIVE NEGATIVE Final    Comment: (NOTE) SARS-CoV-2 target nucleic acids are NOT DETECTED.  The SARS-CoV-2 RNA is generally detectable in upper respiratory specimens during the acute phase of infection. The lowest concentration of SARS-CoV-2 viral copies this assay can detect is 138 copies/mL. A negative result does not preclude SARS-Cov-2 infection and should not be used as the sole basis for treatment or other patient management decisions. A negative result may occur with  improper specimen collection/handling, submission of specimen other than nasopharyngeal swab, presence of viral mutation(s) within the areas targeted by this assay, and inadequate number of viral copies(<138 copies/mL). A negative result must be combined with clinical observations, patient history, and epidemiological information. The expected result is Negative.  Fact Sheet for Patients:  07/17/20  Fact Sheet for Healthcare Providers:  BloggerCourse.com  This test is no t yet approved or cleared by the [place] FDA and  has been authorized for detection and/or diagnosis of SARS-CoV-2 by FDA under an Emergency Use Authorization (EUA). This EUA will remain  in effect (meaning this test can be used) for the duration of the COVID-19 declaration under Section 564(b)(1) of the Act, 21 U.S.C.section 360bbb-3(b)(1), unless the authorization is terminated  or revoked sooner.       Influenza A by PCR NEGATIVE NEGATIVE Final   Influenza B by PCR NEGATIVE NEGATIVE Final    Comment: (NOTE) The Xpert Xpress SARS-CoV-2/FLU/RSV plus assay is intended as an aid in the diagnosis of influenza from Nasopharyngeal swab specimens and should not be used as a sole basis for treatment. Nasal washings and aspirates are  unacceptable for Xpert Xpress SARS-CoV-2/FLU/RSV testing.  Fact Sheet for Patients: [place]  Fact Sheet for Healthcare Providers: BloggerCourse.com  This test is not yet approved or cleared by the SeriousBroker.it FDA and has been authorized for detection and/or diagnosis of SARS-CoV-2 by FDA under an Emergency Use Authorization (EUA). This EUA will remain in effect (meaning this test can be used) for the duration of the COVID-19 declaration under Section 564(b)(1) of the Act, 21 U.S.C. section 360bbb-3(b)(1), unless the authorization is terminated or revoked.  Performed at [facility], [address], [place], [place]   MRSA PCR Screening     Status: Abnormal   Collection Time: 07/16/20  1:58 AM   Specimen: Nasal Mucosa; Nasopharyngeal  Result Value Ref Range Status   MRSA by PCR POSITIVE (A) NEGATIVE Final    Comment:        The GeneXpert MRSA Assay (FDA approved for NASAL specimens  only), is one component of a comprehensive MRSA colonization surveillance program. It is not intended to diagnose MRSA infection nor to guide or monitor treatment for MRSA infections. RESULT CALLED TO, READ BACK BY AND VERIFIED WITH: THOMPSON,T @ 1002 ON [id number] BY POTEAT,S Performed at [facility], [address] [place], [place] [address]      Labs: BNP (last 3 results) Recent Labs    06/21/20 1323 06/24/20 0141 07/15/20 1745  BNP 2,270.3* 235.6* 362.5*   Basic Metabolic Panel: Recent Labs  Lab 07/15/20 1744 07/16/20 0047 07/16/20 0235 07/16/20 0253 07/16/20 1040 07/17/20 0522  NA 137 138  --  140 137 141  K 6.3* 6.9*  --  6.9* 5.5* 5.0  CL 100 105  --  105 102 104  CO2 27 27  --  30 28 34*  GLUCOSE 57* 80  --  94 195* 76  BUN 38* 39*  --  37* 32* 36*  CREATININE 1.28* 1.03  --  0.97 0.79 0.93  CALCIUM 9.2 8.6*  --  8.7* 9.1 8.9  MG  --   --  2.7*  --   --  2.0   PHOS  --   --  5.0*  --   --  2.5   Liver Function Tests: Recent Labs  Lab 07/15/20 1744  AST 116*  ALT 97*  ALKPHOS 135*  BILITOT 0.4  PROT 9.6*  ALBUMIN 4.4   No results for input(s): LIPASE, AMYLASE in the last 168 hours. No results for input(s): AMMONIA in the last 168 hours. CBC: Recent Labs  Lab 07/15/20 1744 07/16/20 0047 07/16/20 0235 07/17/20 0522  WBC 5.0 5.6 5.4 4.0  NEUTROABS 3.5  --   --   --   HGB 13.2 10.4* 10.5* 9.4*  HCT 52.8* 43.1 41.0 35.7*  MCV 88.9 93.1 87.6 87.1  PLT 146* 116* 112* 91*   Cardiac Enzymes: No results for input(s): CKTOTAL, CKMB, CKMBINDEX, TROPONINI in the last 168 hours. BNP: Invalid input(s): POCBNP CBG: No results for input(s): GLUCAP in the last 168 hours. D-Dimer No results for input(s): DDIMER in the last 72 hours. Hgb A1c No results for input(s): HGBA1C in the last 72 hours. Lipid Profile No results for input(s): CHOL, HDL, LDLCALC, TRIG, CHOLHDL, LDLDIRECT in the last 72 hours. Thyroid function studies No results for input(s): TSH, T4TOTAL, T3FREE, THYROIDAB in the last 72 hours.  Invalid input(s): FREET3 Anemia work up No results for input(s): VITAMINB12, FOLATE, FERRITIN, TIBC, IRON, RETICCTPCT in the last 72 hours. Urinalysis    Component Value Date/Time   COLORURINE YELLOW 06/17/2020 2340   APPEARANCEUR HAZY (A) 06/17/2020 2340   LABSPEC 1.010 06/17/2020 2340   PHURINE 5.0 06/17/2020 2340   GLUCOSEU NEGATIVE 06/17/2020 2340   HGBUR LARGE (A) 06/17/2020 2340   BILIRUBINUR NEGATIVE 06/17/2020 2340   KETONESUR NEGATIVE 06/17/2020 2340   PROTEINUR 30 (A) 06/17/2020 2340   UROBILINOGEN 1.0 03/17/2012 0047   NITRITE NEGATIVE 06/17/2020 2340   LEUKOCYTESUR NEGATIVE 06/17/2020 2340   Sepsis Labs Invalid input(s): PROCALCITONIN,  WBC,  LACTICIDVEN Microbiology Recent Results (from the past 240 hour(s))  Resp Panel by RT-PCR (Flu A&B, Covid) Nasopharyngeal Swab     Status: None   Collection Time: 07/15/20   6:47 PM   Specimen: Nasopharyngeal Swab; Nasopharyngeal(NP) swabs in vial transport medium  Result Value Ref Range Status   SARS Coronavirus 2 by RT PCR NEGATIVE NEGATIVE Final    Comment: (NOTE) SARS-CoV-2 target nucleic acids are NOT DETECTED.  The SARS-CoV-2 RNA is generally detectable in upper respiratory specimens during the acute phase of infection. The lowest concentration of SARS-CoV-2 viral copies this assay can detect is 138 copies/mL. A negative result does not preclude SARS-Cov-2 infection and should not be used as the sole basis for treatment or other patient management decisions. A negative result may occur with  improper specimen collection/handling, submission of specimen other than nasopharyngeal swab, presence of viral mutation(s) within the areas targeted by this assay, and inadequate number of viral copies(<138 copies/mL). A negative result must be combined with clinical observations, patient history, and epidemiological information. The expected result is Negative.  Fact Sheet for Patients:  BloggerCourse.com  Fact Sheet for Healthcare Providers:  SeriousBroker.it  This test is no t yet approved or cleared by the [place] FDA and  has been authorized for detection and/or diagnosis of SARS-CoV-2 by FDA under an Emergency Use Authorization (EUA). This EUA will remain  in effect (meaning this test can be used) for the duration of the COVID-19 declaration under Section 564(b)(1) of the Act, 21 U.S.C.section 360bbb-3(b)(1), unless the authorization is terminated  or revoked sooner.       Influenza A by PCR NEGATIVE NEGATIVE Final   Influenza B by PCR NEGATIVE NEGATIVE Final    Comment: (NOTE) The Xpert Xpress SARS-CoV-2/FLU/RSV plus assay is intended as an aid in the diagnosis of influenza from Nasopharyngeal swab specimens and should not be used as a sole basis for treatment. Nasal washings and aspirates  are unacceptable for Xpert Xpress SARS-CoV-2/FLU/RSV testing.  Fact Sheet for Patients: BloggerCourse.com  Fact Sheet for Healthcare Providers: SeriousBroker.it  This test is not yet approved or cleared by the [place] FDA and has been authorized for detection and/or diagnosis of SARS-CoV-2 by FDA under an Emergency Use Authorization (EUA). This EUA will remain in effect (meaning this test can be used) for the duration of the COVID-19 declaration under Section 564(b)(1) of the Act, 21 U.S.C. section 360bbb-3(b)(1), unless the authorization is terminated or revoked.  Performed at [facility], [address] [place], [place] [address]   MRSA PCR Screening     Status: Abnormal   Collection Time: 07/16/20  1:58 AM   Specimen: Nasal Mucosa; Nasopharyngeal  Result Value Ref Range Status   MRSA by PCR POSITIVE (A) NEGATIVE Final    Comment:        The GeneXpert MRSA Assay (FDA approved for NASAL specimens only), is one component of a comprehensive MRSA colonization surveillance program. It is not intended to diagnose MRSA infection nor to guide or monitor treatment for MRSA infections. RESULT CALLED TO, READ BACK BY AND VERIFIED WITH: THOMPSON,T @ 1002 ON [id number] BY POTEAT,S Performed at [facility], [address], [place], [place] [address]      Time coordinating discharge: 35 minutes  SIGNED:   Jacquelin Hawking, MD [facility] 07/17/2020, 11:41 AM

## 2020-07-17 NOTE — TOC Initial Note (Signed)
Transition of Care [facility]) - Initial/Assessment Note    Patient Details  Name: David Short MRN: [id number] Date of Birth: [birth year]  Transition of Care [facility]) CM/SW Contact:    Renard Matter, LCSW Phone Number:  # [phone] 07/17/2020, 11:39 AM  Clinical Narrative:         Spoke with patient and mother at bedside.  Patient becoming increasingly anxious, angry and agitated by the minute, ready to be discharged home. Patient will return home to live with aunt in [place].  Spoke with patient and mother about mental health counseling and substance abuse counseling and resources.  Provided patient and mother with all of the following resource information, agreeing to assist with the referral process, if necessary:  Mental Health/Substance Abuse [facility] Partial Hospitalization Program      Residential Substance Use Treatment Services Mental Health Resources: [facility]  Patient will follow-up with [facility] on Monday, May 2nd. Patient will follow-up with [facility] on Tuesday, May 3rd. Patient will follow-up with [facility] on Wednesday, May 4th. Patient will try to schedule an appointment for follow-up with [facility] and [facility], earlier than the May 24th appointment, already scheduled.   Expected Discharge Plan: Home/Self Care Barriers to Discharge: No Barriers Identified   Patient Goals and CMS Choice Patient states their goals for this hospitalization and ongoing recovery are:: "To receive detox and substance abuse treatment on an outpatient basis". CMS Medicare.gov Compare Post Acute Care list provided to:: Patient Choice offered to / list presented to : Patient  Expected Discharge Plan and Services Expected Discharge Plan: Home/Self Care In-house Referral: Clinical Social Work Discharge Planning  Services: CM Consult,Follow-up appt scheduled,[facility]igent [facility] Post Acute Care Choice: NA Living arrangements for the past 2 months: Single Family Home Expected Discharge Date: 07/17/20               DME Arranged:  (N/A) DME Agency: NA     Representative spoke with at DME Agency: N/A HH Arranged: NA       Representative spoke with at [facility] Agency: N/A  Prior Living Arrangements/Services Living arrangements for the past 2 months: Single Family Home Lives with:: Relatives Midwife) Patient language and need for interpreter reviewed:: No Do you feel safe going back to the place where you live?: Yes      Need for Family Participation in Patient Care: Yes (Comment) Care giver support system in place?: Yes (comment) Current home services:  (N/A) Criminal Activity/Legal Involvement Pertinent to Current Situation/Hospitalization: Yes - Comment as needed  Activities of Daily Living  Independent with ADL's and IADL's.    Permission Sought/Granted Permission sought to share information with : Facility Contractor granted to share information with : Yes, Verbal Permission Granted  Share Information with NAME: Mother - David Short  Permission granted to share info w AGENCY: [facility]; [facility]; [facility]; [facility].  Permission granted to share info w Relationship: N/A  Permission granted to share info w Contact Information: Mother - David Short  Emotional Assessment Appearance:: Appears older than stated age,Disheveled,Malodorous Attitude/Demeanor/Rapport: Avoidant,Angry Affect (typically observed): Angry,Agitated,Anxious Orientation: : Oriented to Self,Oriented to Place,Oriented to  Time,Oriented to Situation Alcohol / Substance Use: Tobacco Use,Alcohol Use,Illicit Drugs Psych Involvement: No (comment)  Admission diagnosis:   Overdose [T50.901A] Acute respiratory  failure with hypoxia and hypercapnia (HCC) [J96.01, J96.02] Drug overdose, undetermined intent, initial encounter [T50.904A] Patient Active Problem List   Diagnosis Date Noted  . Overdose 07/15/2020  . Lymphadenopathy 06/28/2020  . Abdominal pain 06/28/2020  . Other ascites   . Iron deficiency anemia   . CHF (congestive heart failure) (HCC) 06/18/2020  . Bacteremia due to Gram-negative bacteria 12/30/2018  . Vertebral osteomyelitis, acute-C5-C6 12/26/2018  . Neck pain 12/15/2018  . Malnutrition of moderate degree 12/15/2018  . S/P cervical spinal fusion 12/14/2018  . Aseptic meningitis 12/12/2018  . Accelerated hypertension 12/12/2018  . Sinus tachycardia 12/12/2018  . Thrombocytopenia (HCC) 12/12/2018   PCP:  Default, Provider, MD Pharmacy:   [facility] [phone] Ginette Otto, [place] - [phone] AT [facility] [address] [place] [address] Phone: [phone] Fax: [phone]  [facility] [address] [place] [address] Phone: [phone] Fax: [phone]   Social Determinants of Health (SDOH) Interventions Depression Interventions/Treatment : Counseling,Patient refuses Treatment  Readmission Risk Interventions No flowsheet data found.   Danford Bad, BSW, MSW, LCSW  Licensed Restaurant manager, fast food [facility]  Mailing [place] [address] [place], [place] [address] Physical Address-[address] [place], [place] [address] Toll Free Main # [phone] Fax # [phone] Cell # [phone]  [email or web address]

## 2020-07-17 NOTE — Plan of Care (Signed)
Reviewed written d/c instructions in detail w pt and his mother. Also reviewed ADS info that SW provided. Many questions answered. Also reviewed MRSA protocol and gave first dose of Bacitracin. Sent pt home w instructions and Qtips. They both verbalize understanding. D/c per w/c with all belongings in stable condition.

## 2020-07-18 LAB — BLOOD GAS, ARTERIAL
Acid-Base Excess: 1.9 mmol/L (ref 0.0–2.0)
Bicarbonate: 32.2 mmol/L — ABNORMAL HIGH (ref 20.0–28.0)
O2 Saturation: 98.1 %
Patient temperature: 98.6
pCO2 arterial: 87.4 mmHg (ref 32.0–48.0)
pH, Arterial: 7.191 — CL (ref 7.350–7.450)
pO2, Arterial: 106 mmHg (ref 83.0–108.0)

## 2020-07-19 ENCOUNTER — Encounter: Payer: Self-pay | Admitting: General Practice

## 2020-07-19 NOTE — Progress Notes (Signed)
[facility] CSW Progress Notes  Request from Transportation Services to refer patient in need of rides to/from [facility] appointment.  Referral sent, referral to cover rides to/from [facility] appointments only.  Santa Genera, LCSW Clinical Social Worker Phone:  [phone]

## 2020-07-20 ENCOUNTER — Telehealth: Payer: Self-pay

## 2020-07-20 DIAGNOSIS — E875 Hyperkalemia: Secondary | ICD-10-CM

## 2020-07-20 DIAGNOSIS — F1410 Cocaine abuse, uncomplicated: Secondary | ICD-10-CM

## 2020-07-20 DIAGNOSIS — F1110 Opioid abuse, uncomplicated: Secondary | ICD-10-CM

## 2020-07-20 DIAGNOSIS — J9602 Acute respiratory failure with hypercapnia: Secondary | ICD-10-CM

## 2020-07-20 NOTE — Telephone Encounter (Signed)
   David Short DOB: April 17, 1970 MRN: [id number]   RIDER WAIVER AND RELEASE OF LIABILITY  For purposes of improving physical access to our facilities, [facility] is pleased to partner with third parties to provide [facility] patients or other authorized individuals the option of convenient, on-demand ground transportation services (the [employer]") through use of the technology service that enables users to request on-demand ground transportation from independent third-party providers.  By opting to use and accept these [employer], I, the undersigned, hereby agree on behalf of myself, and on behalf of any minor child using the [employer] for whom I am the parent or legal guardian, as follows:  1. Science writer provided to me are provided by independent third-party transportation providers who are not [employer] or employees and who are unaffiliated with [employer]. 2. [place] is neither a transportation carrier nor a common or public carrier. 3. [facility] has no control over the quality or safety of the transportation that occurs as a result of the [employer]. 4. [facility] cannot guarantee that any third-party transportation provider will complete any arranged transportation service. 5. [facility] makes no representation, warranty, or guarantee regarding the reliability, timeliness, quality, safety, suitability, or availability of any of the Transport Services or that they will be error free. 6. I fully understand that traveling by vehicle involves risks and dangers of serious bodily injury, including permanent disability, paralysis, and death. I agree, on behalf of myself and on behalf of any minor child using the Transport Services for whom I am the parent or legal guardian, that the entire risk arising out of my use of the [employer] remains solely with me, to the maximum extent permitted under applicable law. 7. The [employer] are provided "as is" and "as available." [facility] disclaims all representations and warranties, express, implied or statutory, not expressly set out in these terms, including the implied warranties of merchantability and fitness for a particular purpose. 8. I hereby waive and release [place], its agents, employees, officers, directors, representatives, insurers, attorneys, assigns, successors, subsidiaries, and affiliates from any and all past, present, or future claims, demands, liabilities, actions, causes of action, or suits of any kind directly or indirectly arising from acceptance and use of the [employer]. 9. I further waive and release [facility] and its affiliates from all present and future liability and responsibility for any injury or death to persons or damages to property caused by or related to the use of the [employer]. 10. I have read this Waiver and Release of Liability, and I understand the terms used in it and their legal significance. This Waiver is freely and voluntarily given with the understanding that my right (as well as the right of any minor child for whom I am the parent or legal guardian using the [employer]) to legal recourse against [place] in connection with the [employer] is knowingly surrendered in return for use of these services.   I attest that I read the consent document to David Short, gave Mr. Shen the opportunity to ask questions and answered the questions asked (if any). I affirm that David Short then provided consent for he's participation in this program.     Launa Grill, read to Patients Mother

## 2020-07-22 ENCOUNTER — Other Ambulatory Visit: Payer: Self-pay

## 2020-07-22 ENCOUNTER — Ambulatory Visit (INDEPENDENT_AMBULATORY_CARE_PROVIDER_SITE_OTHER): Payer: Self-pay | Admitting: Infectious Diseases

## 2020-07-22 ENCOUNTER — Telehealth: Payer: Self-pay

## 2020-07-22 ENCOUNTER — Other Ambulatory Visit (HOSPITAL_COMMUNITY): Payer: Self-pay

## 2020-07-22 ENCOUNTER — Encounter: Payer: Self-pay | Admitting: Infectious Diseases

## 2020-07-22 VITALS — BP 91/61 | HR 105 | Temp 97.5°F | Ht 70.0 in | Wt 131.0 lb

## 2020-07-22 DIAGNOSIS — J9602 Acute respiratory failure with hypercapnia: Secondary | ICD-10-CM

## 2020-07-22 DIAGNOSIS — B1910 Unspecified viral hepatitis B without hepatic coma: Secondary | ICD-10-CM

## 2020-07-22 DIAGNOSIS — Z8619 Personal history of other infectious and parasitic diseases: Secondary | ICD-10-CM

## 2020-07-22 DIAGNOSIS — F1910 Other psychoactive substance abuse, uncomplicated: Secondary | ICD-10-CM

## 2020-07-22 DIAGNOSIS — B181 Chronic viral hepatitis B without delta-agent: Secondary | ICD-10-CM

## 2020-07-22 DIAGNOSIS — R591 Generalized enlarged lymph nodes: Secondary | ICD-10-CM

## 2020-07-22 NOTE — Telephone Encounter (Signed)
[facility] Patient Advocate Encounter ? ?Insurance verification completed.   ? ?The patient is uninsured and will need patient assistance for medication. ? ?We can complete the application and will need to meet with the patient for signatures and income documentation. ? ?Brent Noto, CPhT ?Specialty Pharmacy Patient Advocate ?[facility] ?Phone: [phone] ?Fax:  [phone]  ?

## 2020-07-22 NOTE — Assessment & Plan Note (Signed)
On CT scan there is concern for splenomegaly, lymphadenopathy and possible lymphoproliferative disorder.  We discussed this today and I reminded him of his upcoming visit with oncology team.

## 2020-07-22 NOTE — Assessment & Plan Note (Signed)
Recent hospitalization with positive hepatitis B surface antigen, positive total core antibody, positive E antigen detected with hepatitis B DNA >1,000,000,000 IU/mL.  It is unclear as to how long he has had hepatitis B.  I suspect he has acquired this acutely at some point recently.  Although there is no information for previous data points.  We will draw hepatitis B core IgM today to see if this helps differentiate.  We will also repeat CMP, CBC and INR to trend whether he needs rescue antiviral therapy or we can continue to monitor for spontaneous resolution.   CT scan does not show features consistent with cirrhosis, although he had significant splenomegaly, thrombocytopenia, mild coagulopathy and ascites.  He did have acute heart failure exacerbation during this time.  On exam he is got no evidence of ascites today.  He does have multiple scattered cuts and bruises all over his body with dried blood all over his clothes.  Multiple track marks and old abscesses noted on the skin.  We did not spend a lot of time talking about hepatitis given his overall instability from a pulmonary standpoint.  He will come back in 1 month after he has been able to see his PCP and discuss further.

## 2020-07-22 NOTE — Assessment & Plan Note (Signed)
Upon initial assessment from [place], RN he was noted to be hypoxic in the 60s as his oxygen tank he brought from home was empty.  We quickly placed him on a oxygen tank in the clinic and a facemask giving him 6 L/min of oxygen.  Rebounded to 95%.  I was able to place back on his nasal cannula to 4 L/min with sats 88 to 90%.  On exam he looks euvolemic, no wheezing or cough demonstrated throughout the visit.  We called the oxygen distribution company to ensure that he has tanks that are going to be delivered to him.  They will ensure that he has 3 tanks delivered to his house.  They just need to contact the patient to go over COVID-related questions. We did utilize nonemergent patient transport (PTAR) services to get the patient home safely to more durable oxygen source.  He declined multiple times recommendation to go to the emergency room for evaluation citing that he felt fine.  He does not have a appointment with his PCP until the end of May. ER precautions discussed for return in the interim. Counseled to ensure he looks at the oxygen tank before leaving the home.

## 2020-07-22 NOTE — Patient Instructions (Addendum)
We will get you home safely today with the oxygen you need.   Will check some labs to ensure you are still improving from your recent hospital stay   Please schedule a follow up in 1 month so we can then resume some conversations regarding hepatitis and what we need to do going forward for you.

## 2020-07-22 NOTE — Progress Notes (Signed)
Patient Name: David Short  Date of Birth: [birth year]  MRN: [id number]  PCP: Default, Provider, MD  Referring Provider: Doran Stabler, MD, Ph#: [phone]    CC:  New patient - initial evaluation and management of hepatitis, referred from hospital after recent admission related to respiratory failure and heroin overdose.     HPI/ROS:  David Short is a 50 y.o. male as a new patient visit to evaluate for treatment in the setting of hepatitis B / C.   Multiple recent hospitalizations reviewed -  April 29th David Short was found to be unconscious unresponsive and hypoxic with with oxygen saturations in the 70s at home.  He was brought to the emergency room given Narcan with prompt response.  He did require some short-term BiPAP and diuresis during his hospital stay.  David Short tells me that he has been using intravenous heroin for many years now.  He has had multiple sequela due to this in the past.  He uses every to every other day with last use yesterday 5/5.  He tells me he is quit on methadone before and has an appointment on Wednesday with [facility] to reengage with this.  He was offered Suboxone initiation in the hospital however he declined and requested discharge home.  He states that he had chest compressions done for cardiac arrest recently and he has some left upper quadrant/lower rib pain due to this.  Prior to this on April 1 he was admitted for dyspnea work-up.  At that time he reported this was worsening over the 2 weeks prior with some significant lower extremity edema up to the knees.  Found to have right-sided pleural effusion, pulmonary edema, ascites anasarca.  He was given treatment for COPD exacerbation with steroids, antibiotics and diuresis with IV Lasix as well as BiPAP.  At this visit he was discharged on 2 L nasal cannula.  Today team is uncertain as to how much oxygen he uses at home.  When he presented to our clinic from the waiting room his oxygen saturations were only  60% due to an empty oxygen tank that he was attached to.  He is pretty certain he has 1 tank left at home that is full but multiple empty ones.  Has lost a lot of weight recently - cannot recall exactly how much.  Currently living with his Aunt.    ROS:  Constitutional: positive for fatigue, anorexia and weight loss or negative for fevers and chills Eyes: negative for icterus Respiratory: positive for cough, pleurisy/chest pain, dyspnea on exertion or emphysema, negative for sputum or wheezing Cardiovascular: positive for lower extremity edema, negative for palpitations, orthopnea, hypervolemia Gastrointestinal: positive for abdominal pain, negative for nausea, vomiting and jaundice Hematologic/lymphatic: positive for easy bruising and bleeding, negative for petechiae Musculoskeletal: positive for arthralgias and back pain. Behavioral/Psych: positive for illegal drug usage, negative for excessive alcohol consumption All other systems reviewed and are negative      Past Medical History:  Diagnosis Date  . CHF (congestive heart failure) ([place])   . Chronic back pain   . COPD (chronic obstructive pulmonary disease) ([place])   . IDA (iron deficiency anemia)   . IVDU (intravenous drug user)    heroin   . On home oxygen therapy   . Osteomyelitis ([place])   . Splenomegaly   . Thrombocytopenia ([place])   . Tobacco use     Prior to Admission medications   Medication Sig Start Date End Date Taking? Authorizing Provider  albuterol (  VENTOLIN HFA) 108 (90 Base) MCG/ACT inhaler Inhale 2 puffs into the lungs every 6 (six) hours as needed for wheezing or shortness of breath. 06/26/20  Yes Rai, Ripudeep K, MD  furosemide (LASIX) 20 MG tablet Take 1 tablet (20 mg total) by mouth daily. 06/26/20 07/26/20 Yes Rai, Ripudeep K, MD  hydrocortisone (ANUSOL-HC) 25 MG suppository Place 1 suppository (25 mg total) rectally 2 (two) times daily. For hemorrhoids Patient not taking: Reported on 07/22/2020 06/26/20   Rai,  Vernelle Emerald, MD  pantoprazole (PROTONIX) 40 MG tablet Take 1 tablet (40 mg total) by mouth daily. Patient not taking: Reported on 07/22/2020 06/26/20   Mendel Corning, MD    Allergies  Allergen Reactions  . Tylenol [Acetaminophen] Hives    Social History   Tobacco Use  . Smoking status: Current Every Day Smoker    Packs/day: 1.00    Types: Cigarettes  . Smokeless tobacco: Never Used  Vaping Use  . Vaping Use: Never used  Substance Use Topics  . Alcohol use: Not Currently  . Drug use: Not Currently    Types: IV, Heroin    Comment: IV heroin last use 07/21/20    Family History  Problem Relation Age of Onset  . Hypertension Sister   . Hypertension Brother   . Colon cancer Maternal Grandmother   . Prostate cancer Maternal Grandfather   . Colon cancer Maternal Grandfather      Objective:   Vitals:   07/22/20 1035  BP: 91/61  Pulse: (!) 105  Temp: (!) 97.5 F (36.4 C)  SpO2: 92%   Constitutional: older than stated age, thin appearing, malnourished man in no physical acute distress Eyes: anicteric Cardiovascular: Cor Tachy and No murmurs Respiratory: clear Gastrointestinal: Bowel sounds are normal, liver is not enlarged, spleen tip palpable. No ascites present.  Musculoskeletal: peripheral pulses normal, no pedal edema, no clubbing or cyanosis Skin: negative for - jaundice, spider hemangioma, telangiectasia, palmar erythema, ecchymosis and atrophy; no porphyria cutanea tarda. Multiple tract marks, old abscess scars and skin tears noted.  Lymphatic: no cervical lymphadenopathy   Laboratory: Genotype:  Lab Results  Component Value Date   HCVGENOTYPE Not Detected 07/15/2020   HCV viral load: No results found for: HCVQUANT Lab Results  Component Value Date   WBC 4.0 07/17/2020   HGB 9.4 (L) 07/17/2020   HCT 35.7 (L) 07/17/2020   MCV 87.1 07/17/2020   PLT 91 (L) 07/17/2020    Lab Results  Component Value Date   CREATININE 0.93 07/17/2020   BUN 36 (H)  07/17/2020   NA 141 07/17/2020   K 5.0 07/17/2020   CL 104 07/17/2020   CO2 34 (H) 07/17/2020    Lab Results  Component Value Date   ALT 97 (H) 07/15/2020   AST 116 (H) 07/15/2020   ALKPHOS 135 (H) 07/15/2020    Lab Results  Component Value Date   INR 1.4 (H) 06/25/2020   BILITOT 0.4 07/15/2020   ALBUMIN 4.4 07/15/2020     Imaging:  CT A/P 06/24/20: IMPRESSION: 1. Interval decrease in size of a small right pleural effusion. 2. Stable splenomegaly (18cm that is new from 2013). Question prominent retroperitoneal lymph nodes without definite lymphadenopathy. Findings suggestive of a lymphoproliferative disorder. Consider PET-CT for further evaluation. 3. Constipation. 4.  Aortic Atherosclerosis (ICD10-I70.0) - mild.    Assessment & Plan:   Problem List Items Addressed This Visit      Unprioritized   Lymphadenopathy    On CT scan there is  concern for splenomegaly, lymphadenopathy and possible lymphoproliferative disorder.  We discussed this today and I reminded him of his upcoming visit with oncology team.      IV drug abuse [facility])    Unfortunately David Short has been using intravenous heroin for many years.  He has made attempts to quit in the past and has plans to reinitiate in methadone clinic next Wednesday through [facility]. Counseled to not share needles or any equipment related to drug use given that he has active hepatitis B infection. Counseled to clean spilled blood with bleach solution.       History of hepatitis C    He had a positive hepatitis C antibody obtained during her recent hospitalization indicating history of exposure to the virus.  His hepatitis C RNA was not detected.  This indicates he is already achieved spontaneous clearance and needs no further medication to assist in treatment.  We discussed this during the visit today.      Hepatitis B infection - Primary    Recent hospitalization with positive hepatitis B surface antigen, positive total core antibody,  positive E antigen detected with hepatitis B DNA >1,000,000,000 IU/mL.  It is unclear as to how long he has had hepatitis B.  I suspect he has acquired this acutely at some point recently.  Although there is no information for previous data points.  We will draw hepatitis B core IgM today to see if this helps differentiate.  We will also repeat CMP, CBC and INR to trend whether he needs rescue antiviral therapy or we can continue to monitor for spontaneous resolution.   CT scan does not show features consistent with cirrhosis, although he had significant splenomegaly, thrombocytopenia, mild coagulopathy and ascites.  He did have acute heart failure exacerbation during this time.  On exam he is got no evidence of ascites today.  He does have multiple scattered cuts and bruises all over his body with dried blood all over his clothes.  Multiple track marks and old abscesses noted on the skin.  We did not spend a lot of time talking about hepatitis given his overall instability from a pulmonary standpoint.  He will come back in 1 month after he has been able to see his PCP and discuss further.      Relevant Orders   Comp Met (CMET)   CBC   Hepatitis B Core Antibody, IgM   Protime-INR   Acute respiratory failure with hypercapnia (HCC)    Upon initial assessment from [facility], RN he was noted to be hypoxic in the 60s as his oxygen tank he brought from home was empty.  We quickly placed him on a oxygen tank in the clinic and a facemask giving him 6 L/min of oxygen.  Rebounded to 95%.  I was able to place back on his nasal cannula to 4 L/min with sats 88 to 90%.  On exam he looks euvolemic, no wheezing or cough demonstrated throughout the visit.  We called the oxygen distribution company to ensure that he has tanks that are going to be delivered to him.  They will ensure that he has 3 tanks delivered to his house.  They just need to contact the patient to go over COVID-related questions. We did utilize  nonemergent patient transport (PTAR) services to get the patient home safely to more durable oxygen source.  He declined multiple times recommendation to go to the emergency room for evaluation citing that he felt fine.  He does not have a appointment  with his PCP until the end of May. ER precautions discussed for return in the interim. Counseled to ensure he looks at the oxygen tank before leaving the home.          I spent 60 minutes with the patient including greater than 70% of time in face to face counsel of the patient re hepatitis c and the details described above and in coordination of their care.  Janene Madeira, MSN, NP-C [facility] [email or web address] Pager: [phone] Office: [phone] [place]: [phone]

## 2020-07-22 NOTE — Assessment & Plan Note (Signed)
He had a positive hepatitis C antibody obtained during her recent hospitalization indicating history of exposure to the virus.  His hepatitis C RNA was not detected.  This indicates he is already achieved spontaneous clearance and needs no further medication to assist in treatment.  We discussed this during the visit today.

## 2020-07-22 NOTE — Telephone Encounter (Signed)
RN spoke with Almira Coaster at [facility] to request that additional full oxygen tanks be sent to the patient's home. Almira Coaster states she has placed an order to have full tanks delivered.   Sandie Ano, RN

## 2020-07-22 NOTE — Assessment & Plan Note (Signed)
Unfortunately David Short has been using intravenous heroin for many years.  He has made attempts to quit in the past and has plans to reinitiate in methadone clinic next Wednesday through [facility]. Counseled to not share needles or any equipment related to drug use given that he has active hepatitis B infection. Counseled to clean spilled blood with bleach solution.

## 2020-07-23 LAB — HEPATITIS DELTA ANTIBODY: Hepatitis D Ab, Total: NEGATIVE

## 2020-07-23 LAB — HEPATITIS B DNA, ULTRAQUANTITATIVE, PCR: Hepatitis B DNA (Calc): >9 Log IU/mL — ABNORMAL HIGH

## 2020-07-23 LAB — TIQ- AMBIGUOUS ORDER

## 2020-07-23 LAB — HEPATITIS C RNA QUANTITATIVE
HCV Quantitative Log: <1.18 log IU/mL
HCV RNA, PCR, QN: <15 IU/mL

## 2020-07-23 LAB — HEPATITIS C GENOTYPE: HCV Genotype: NOT DETECTED

## 2020-07-23 LAB — AFP TUMOR MARKER: AFP-Tumor Marker: 2.3 ng/mL (ref <6.1)

## 2020-07-23 LAB — HEPATITIS B E ANTIGEN: Hep B E Ag: REACTIVE — AB

## 2020-07-23 LAB — HEPATITIS B E ANTIBODY: Hep B E Ab: NONREACTIVE

## 2020-07-25 ENCOUNTER — Telehealth: Payer: Self-pay

## 2020-07-25 DIAGNOSIS — R7989 Other specified abnormal findings of blood chemistry: Secondary | ICD-10-CM

## 2020-07-25 LAB — CBC
HCT: 35 % — ABNORMAL LOW (ref 38.5–50.0)
Hemoglobin: 9.9 g/dL — ABNORMAL LOW (ref 13.2–17.1)
MCH: 23.3 pg — ABNORMAL LOW (ref 27.0–33.0)
MCHC: 28.3 g/dL — ABNORMAL LOW (ref 32.0–36.0)
MCV: 82.4 fL (ref 80.0–100.0)
MPV: 10.3 fL (ref 7.5–12.5)
Platelets: 182 10*3/uL (ref 140–400)
RBC: 4.25 10*6/uL (ref 4.20–5.80)
RDW: 25.1 % — ABNORMAL HIGH (ref 11.0–15.0)
WBC: 6.7 10*3/uL (ref 3.8–10.8)

## 2020-07-25 LAB — COMPREHENSIVE METABOLIC PANEL
AG Ratio: 1.1 (calc) (ref 1.0–2.5)
ALT: 143 U/L — ABNORMAL HIGH (ref 9–46)
AST: 111 U/L — ABNORMAL HIGH (ref 10–35)
Albumin: 3.7 g/dL (ref 3.6–5.1)
Alkaline phosphatase (APISO): 112 U/L (ref 35–144)
BUN/Creatinine Ratio: 32 (calc) — ABNORMAL HIGH (ref 6–22)
BUN: 60 mg/dL — ABNORMAL HIGH (ref 7–25)
CO2: 31 mmol/L (ref 20–32)
Calcium: 8.8 mg/dL (ref 8.6–10.3)
Chloride: 100 mmol/L (ref 98–110)
Creat: 1.9 mg/dL — ABNORMAL HIGH (ref 0.70–1.33)
Globulin: 3.3 g/dL (calc) (ref 1.9–3.7)
Glucose, Bld: 105 mg/dL — ABNORMAL HIGH (ref 65–99)
Potassium: 5.1 mmol/L (ref 3.5–5.3)
Sodium: 138 mmol/L (ref 135–146)
Total Bilirubin: 0.7 mg/dL (ref 0.2–1.2)
Total Protein: 7 g/dL (ref 6.1–8.1)

## 2020-07-25 LAB — PROTIME-INR
INR: 1.1
Prothrombin Time: 11.4 s (ref 9.0–11.5)

## 2020-07-25 LAB — HEPATITIS B CORE ANTIBODY, IGM: Hep B C IgM: NONREACTIVE

## 2020-07-25 NOTE — Telephone Encounter (Signed)
-----   Message from Blanchard Kelch, NP sent at 07/23/2020  6:19 PM EDT ----- Please call David Short to let him know that his kidney function is significantly worse than last check at his hospital stay. I would ask he stop his lasix for the time being. Can he come back for a repeat bmp in 1 week?  Would also ask him to please not use any NSAIDS (ibuprofen, aleve, motrin, aspirin).  Unfortunately, he is not yet established with a PCP and needs some help in the interim.

## 2020-07-25 NOTE — Addendum Note (Signed)
Addended by: Linna Hoff D on: 07/25/2020 11:00 AM   Modules accepted: Orders

## 2020-07-25 NOTE — Telephone Encounter (Signed)
RN relayed to patient that his kidney function is significantly worse than at the last check in the hospital per Rexene Alberts, NP. Advised patient that Judeth Cornfield would like for him to stop his Lasix for the time being and would also like for him to avoid medications like Advil, aleve, motrin, and aspirin. Patient scheduled for repeat labs 08/01/20. Patient verbalized understanding and has no further questions.   Sandie Ano, RN

## 2020-07-26 ENCOUNTER — Telehealth: Payer: Self-pay | Admitting: Physician Assistant

## 2020-07-26 ENCOUNTER — Other Ambulatory Visit: Payer: Self-pay | Admitting: Physician Assistant

## 2020-07-26 DIAGNOSIS — D508 Other iron deficiency anemias: Secondary | ICD-10-CM

## 2020-07-26 NOTE — Telephone Encounter (Signed)
R/s appts per 5/10 sch msg. Pt aware.  

## 2020-07-27 ENCOUNTER — Inpatient Hospital Stay: Payer: MEDICAID

## 2020-07-27 ENCOUNTER — Inpatient Hospital Stay: Payer: MEDICAID | Admitting: Physician Assistant

## 2020-08-01 ENCOUNTER — Telehealth: Payer: Self-pay

## 2020-08-01 ENCOUNTER — Other Ambulatory Visit: Payer: Self-pay

## 2020-08-01 DIAGNOSIS — R7989 Other specified abnormal findings of blood chemistry: Secondary | ICD-10-CM

## 2020-08-01 NOTE — Telephone Encounter (Signed)
Patient arrived for lab visit with oxygen tank almost out. Patient reports being on 3L by [place] for oxygen. O2 was at 87% when notified by lab manager. Provided patient with new oxygen tank and started him on 4L O2. Oxygen saturation improved to 93%. RN contacted [employer] to request sooner delivery of oxygen tanks. Delivery was scheduled for tomorrow 5/17. Requested sooner delivery since patient is almost out. [employer] team reaching out to driver to see if they can deliver today after 5. Patient is ok with evening delivery.    Patient declined PTAR ride home; requested to take Benedetto Goad ride home with O2 tank he has which has a low amount of oxygen left. Stated he'll use his oxygen concentrator once he arrives.   Educated patient on the importance of maintaining his oxygen tanks and monitoring the levels since he arrived to our office before without any oxygen.   Rosanna Randy, RN  [employer]: [phone]

## 2020-08-02 ENCOUNTER — Inpatient Hospital Stay (HOSPITAL_BASED_OUTPATIENT_CLINIC_OR_DEPARTMENT_OTHER): Payer: Medicaid Other | Admitting: Physician Assistant

## 2020-08-02 ENCOUNTER — Inpatient Hospital Stay: Payer: Medicaid Other | Attending: Physician Assistant

## 2020-08-02 VITALS — BP 130/90 | HR 108 | Temp 97.9°F | Resp 18 | Wt 125.7 lb

## 2020-08-02 DIAGNOSIS — R109 Unspecified abdominal pain: Secondary | ICD-10-CM | POA: Insufficient documentation

## 2020-08-02 DIAGNOSIS — F17210 Nicotine dependence, cigarettes, uncomplicated: Secondary | ICD-10-CM | POA: Diagnosis not present

## 2020-08-02 DIAGNOSIS — D508 Other iron deficiency anemias: Secondary | ICD-10-CM

## 2020-08-02 DIAGNOSIS — R591 Generalized enlarged lymph nodes: Secondary | ICD-10-CM | POA: Diagnosis not present

## 2020-08-02 DIAGNOSIS — D696 Thrombocytopenia, unspecified: Secondary | ICD-10-CM | POA: Diagnosis not present

## 2020-08-02 DIAGNOSIS — Z79899 Other long term (current) drug therapy: Secondary | ICD-10-CM | POA: Diagnosis not present

## 2020-08-02 DIAGNOSIS — D509 Iron deficiency anemia, unspecified: Secondary | ICD-10-CM | POA: Insufficient documentation

## 2020-08-02 LAB — IRON AND TIBC
Iron: 32 ug/dL — ABNORMAL LOW (ref 42–163)
Saturation Ratios: 11 % — ABNORMAL LOW (ref 20–55)
TIBC: 290 ug/dL (ref 202–409)
UIBC: 258 ug/dL (ref 117–376)

## 2020-08-02 LAB — CBC WITH DIFFERENTIAL (CANCER CENTER ONLY)
Abs Immature Granulocytes: 0.03 10*3/uL (ref 0.00–0.07)
Basophils Absolute: 0 10*3/uL (ref 0.0–0.1)
Basophils Relative: 0 %
Eosinophils Absolute: 0.2 10*3/uL (ref 0.0–0.5)
Eosinophils Relative: 2 %
HCT: 44.1 % (ref 39.0–52.0)
Hemoglobin: 12.1 g/dL — ABNORMAL LOW (ref 13.0–17.0)
Immature Granulocytes: 0 %
Lymphocytes Relative: 12 %
Lymphs Abs: 0.8 10*3/uL (ref 0.7–4.0)
MCH: 24.3 pg — ABNORMAL LOW (ref 26.0–34.0)
MCHC: 27.4 g/dL — ABNORMAL LOW (ref 30.0–36.0)
MCV: 88.6 fL (ref 80.0–100.0)
Monocytes Absolute: 0.7 10*3/uL (ref 0.1–1.0)
Monocytes Relative: 10 %
Neutro Abs: 5.3 10*3/uL (ref 1.7–7.7)
Neutrophils Relative %: 76 %
Platelet Count: 197 10*3/uL (ref 150–400)
RBC: 4.98 MIL/uL (ref 4.22–5.81)
RDW: 24.5 % — ABNORMAL HIGH (ref 11.5–15.5)
WBC Count: 7 10*3/uL (ref 4.0–10.5)
nRBC: 0 % (ref 0.0–0.2)

## 2020-08-02 LAB — BASIC METABOLIC PANEL
BUN/Creatinine Ratio: 30 (calc) — ABNORMAL HIGH (ref 6–22)
BUN: 17 mg/dL (ref 7–25)
CO2: 32 mmol/L (ref 20–32)
Calcium: 9.3 mg/dL (ref 8.6–10.3)
Chloride: 101 mmol/L (ref 98–110)
Creat: 0.57 mg/dL — ABNORMAL LOW (ref 0.70–1.33)
Glucose, Bld: 67 mg/dL (ref 65–99)
Potassium: 4.8 mmol/L (ref 3.5–5.3)
Sodium: 139 mmol/L (ref 135–146)

## 2020-08-02 LAB — RETIC PANEL
Immature Retic Fract: 9.9 % (ref 2.3–15.9)
RBC.: 4.94 MIL/uL (ref 4.22–5.81)
Retic Count, Absolute: 86 10*3/uL (ref 19.0–186.0)
Retic Ct Pct: 1.7 % (ref 0.4–3.1)
Reticulocyte Hemoglobin: 24.7 pg — ABNORMAL LOW (ref >27.9)

## 2020-08-02 LAB — FERRITIN: Ferritin: 142 ng/mL (ref 24–336)

## 2020-08-02 MED ORDER — FERROUS SULFATE 325 (65 FE) MG PO TBEC: 325.0000 mg | DELAYED_RELEASE_TABLET | Freq: Every day | ORAL | 3 refills | Status: AC

## 2020-08-02 NOTE — Progress Notes (Signed)
[facility] [phone]   [phone] PROGRESS NOTE  Patient Care Team: Default, Provider, MD as PCP - General  Hematological/Oncological History 1) 06/17/2020-06/26/2020: Admitted for acute, possibly on chronic CHF RV failure, acute hypoxic and hypercapnic respiratory failure.  -Patient was found to be anemia with Hgb of 6.4 at admission. Patient was given 1 unit of pRBC on 06/19/2020. B12, folate and iron levels were checked that revealed iron deficiency anemia.  -CT CAP from 06/17/2020 revealed extensive features of anasarca with circumferential body wall edema, pleural and pericardial effusion, periportal edema, moderate volume ascites and edematous changes of the mesentery.  Borderline enlarged mediastinal hilar, abdominal pelvic nodes. Splenomegaly measuring up to 18 cm in size.  -Repeat CT CAP from 06/24/2020 revealed interval decrease in size of a small right pleural effusion.  Persistent splenomegaly.  Questionable prominent retroperitoneal lymph nodes without definitive lymphadenopathy.  2) 06/28/2020: Establish care with Georga Kaufmann PA-C  3) Received IV feraheme x 2 doses on 07/07/2020 and 07/14/2020.    HISTORY OF PRESENTING ILLNESS:  David Short 50 y.o. male with medical history significant for vertebral osteomyelitis, anterior cervical discectomy, cervical spinal fusion, IV heroin use and COPD. Patient is unaccompanied for this visit. Since the interim, patient has received IV feraheme x 2 doses. In addition, he has been hospitalized from 07/15/2020-07/17/2020 for acute respiratory failure with hypercapnia, IV drug abuse, opiate overdose.   On exam today, David Short reports persistent fatigue since hospital discharge on 07/17/20. Patient continues to require 3 L of supplemental oxygen at all times. He continues to have poor appetite with noted weight loss. Patient denies any nausea or vomiting. Patient notes ongoing diffuse abdominal pain. He was  prescribed oxycodone in the past but overdosed requiring recent hospitalization. Patient reports daily bowel movements. He denies any signs of bleeding including hematochezia, melena, hematuria, epistaxis or gum bleeding. Patient denies any fevers, chills, night sweats. He has a chronic cough with shortness of breath due to COPD. He has no other complaints. Rest of the 10 point ROS is below.   MEDICAL HISTORY:  Past Medical History:  Diagnosis Date  . CHF (congestive heart failure) (HCC)   . Chronic back pain   . COPD (chronic obstructive pulmonary disease) (HCC)   . IDA (iron deficiency anemia)   . IVDU (intravenous drug user)    heroin   . On home oxygen therapy   . Osteomyelitis (HCC)   . Splenomegaly   . Thrombocytopenia (HCC)   . Tobacco use     SURGICAL HISTORY: Past Surgical History:  Procedure Laterality Date  . ANTERIOR CERVICAL DECOMP/DISCECTOMY FUSION N/A 12/14/2018   Procedure: ANTERIOR CERVICAL DISCECTOMY FUSION CERVICAL FIVE- CERVICAL SIX;  Surgeon: Tia Alert, MD;  Location: [facility] OR;  Service: Neurosurgery;  Laterality: N/A;  . BACK SURGERY     states never had surgery  . FRACTURE SURGERY Left    elbow/wrist/ankle    SOCIAL HISTORY: Social History   Socioeconomic History  . Marital status: Single    Spouse name: Not on file  . Number of children: Not on file  . Years of education: Not on file  . Highest education level: Not on file  Occupational History  . Not on file  Tobacco Use  . Smoking status: Current Every Day Smoker    Packs/day: 1.00    Types: Cigarettes  . Smokeless tobacco: Never Used  Vaping Use  . Vaping Use: Never used  Substance and Sexual Activity  . Alcohol  use: Not Currently  . Drug use: Not Currently    Types: IV, Heroin    Comment: IV heroin last use 07/21/20  . Sexual activity: Not on file  Other Topics Concern  . Not on file  Social History Narrative  . Not on file   Social Determinants of Health   Financial Resource  Strain: Not on file  Food Insecurity: Not on file  Transportation Needs: Not on file  Physical Activity: Not on file  Stress: Not on file  Social Connections: Not on file  Intimate Partner Violence: Not on file    FAMILY HISTORY: Family History  Problem Relation Age of Onset  . Hypertension Sister   . Hypertension Brother   . Colon cancer Maternal Grandmother   . Prostate cancer Maternal Grandfather   . Colon cancer Maternal Grandfather     ALLERGIES:  is allergic to tylenol [acetaminophen].  MEDICATIONS:  Current Outpatient Medications  Medication Sig Dispense Refill  . ferrous sulfate 325 (65 FE) MG EC tablet Take 1 tablet (325 mg total) by mouth daily with breakfast. 30 tablet 3  . albuterol (VENTOLIN HFA) 108 (90 Base) MCG/ACT inhaler Inhale 2 puffs into the lungs every 6 (six) hours as needed for wheezing or shortness of breath. 8 g 2  . furosemide (LASIX) 20 MG tablet Take 1 tablet (20 mg total) by mouth daily. 30 tablet 0  . hydrocortisone (ANUSOL-HC) 25 MG suppository Place 1 suppository (25 mg total) rectally 2 (two) times daily. For hemorrhoids (Patient not taking: Reported on 07/22/2020) 100 suppository 0  . pantoprazole (PROTONIX) 40 MG tablet Take 1 tablet (40 mg total) by mouth daily. (Patient not taking: Reported on 07/22/2020) 30 tablet 1   No current facility-administered medications for this visit.    REVIEW OF SYSTEMS:   Constitutional: ( - ) fevers, ( - )  chills , ( - ) night sweats Eyes: ( - ) blurriness of vision, ( - ) double vision, ( - ) watery eyes Ears, nose, mouth, throat, and face: ( - ) mucositis, ( - ) sore throat Respiratory: ( - ) cough, ( +) dyspnea, ( - ) wheezes Cardiovascular: ( - ) palpitation, ( - ) chest discomfort, ( - ) lower extremity swelling Gastrointestinal:  ( - ) nausea, ( - ) heartburn, ( - ) change in bowel habits Skin: ( - ) abnormal skin rashes Lymphatics: ( - ) new lymphadenopathy, ( +) easy bruising Neurological: ( - )  numbness, ( - ) tingling, ( - ) new weaknesses Behavioral/Psych: ( - ) mood change, ( - ) new changes  All other systems were reviewed with the patient and are negative.  PHYSICAL EXAMINATION: ECOG PERFORMANCE STATUS: 1 - Symptomatic but completely ambulatory  Vitals:   08/02/20 1316  BP: 130/90  Pulse: (!) 108  Resp: 18  Temp: 97.9 F (36.6 C)  SpO2: 95%   Filed Weights   08/02/20 1316  Weight: 125 lb 11.2 oz (57 kg)    GENERAL: no acute distress EYES: conjunctiva are pink and non-injected, sclera clear  LUNGS: clear to auscultation and percussion with normal breathing effort HEART: regular rate & rhythm and no murmurs and no lower extremity edema ABDOMEN: Diffuse tenderness to palpation. normal bowel sounds Musculoskeletal: no cyanosis of digits and no clubbing  PSYCH: alert & oriented x 3, fluent speech NEURO: no focal motor/sensory deficits SKIN: Diffuse bruising and track marks.   LABORATORY DATA:  I have reviewed the data as listed CBC Latest  Ref Rng & Units 08/02/2020 07/22/2020 07/17/2020  WBC 4.0 - 10.5 K/uL 7.0 6.7 4.0  Hemoglobin 13.0 - 17.0 g/dL 12.1(L) 9.9(L) 9.4(L)  Hematocrit 39.0 - 52.0 % 44.1 35.0(L) 35.7(L)  Platelets 150 - 400 K/uL 197 182 91(L)    CMP Latest Ref Rng & Units 08/01/2020 07/22/2020 07/17/2020  Glucose 65 - 99 mg/dL 67 440(N) 76  BUN 7 - 25 mg/dL 17 02(V) 25(D)  Creatinine 0.70 - 1.33 mg/dL 6.64(Q) 0.34(V) 4.25  Sodium 135 - 146 mmol/L 139 138 141  Potassium 3.5 - 5.3 mmol/L 4.8 5.1 5.0  Chloride 98 - 110 mmol/L 101 100 104  CO2 20 - 32 mmol/L 32 31 34(H)  Calcium 8.6 - 10.3 mg/dL 9.3 8.8 8.9  Total Protein 6.1 - 8.1 g/dL - 7.0 -  Total Bilirubin 0.2 - 1.2 mg/dL - 0.7 -  Alkaline Phos 38 - 126 U/L - - -  AST 10 - 35 U/L - 111(H) -  ALT 9 - 46 U/L - 143(H) -   RADIOGRAPHIC STUDIES: I have personally reviewed the radiological images as listed and agreed with the findings in the report. DG Chest Port 1 View  Result Date:  07/15/2020 CLINICAL DATA:  Shortness of breath EXAM: PORTABLE CHEST 1 VIEW COMPARISON:  06/24/2020 FINDINGS: Heart size is normal. Mediastinal shadows are normal. Underlying emphysema. No evidence of focal infiltrate, collapse or effusion. Bony structures unremarkable. IMPRESSION: Emphysema. No active disease. Electronically Signed   By: Paulina Fusi M.D.   On: 07/15/2020 18:30    ASSESSMENT & PLAN David Short is a 50 y.o. male presenting to the clinic for a follow up for iron deficiency anemia.   #Iron deficiency anemia, etiology unknown: --Received IV feraheme x 2 doses on 07/07/2020 and 07/14/2020.  --Labs today show improvement of hemoglobin to 12.1. Iron saturation is still 11%, Iron is 32 and ferritin is 142.  --Recommend to take oral ferrous sulfate 325 mg once daily. --Under the care of Dr. Amada Jupiter in GI. Did not recommend endoscopic procedures with current performance status.  --RTC in 3 months with repeat labs.   #Thrombocytopenia: --Positive hepatitis B surface antigen, positive total core antibody, positive E antigen detected with hepatitis B DNA >1,000,000,000 IU/mL.  --Under the care of infectious disease.  --Platelet count is within normal limits today, continue to monitor.   #Lymphadenopathy: --CT imaging from 06/17/2020 revealed borderline enlarged mediastinal, hilar and abdominopelvic lymph nodes.  --Requested PET/CT scan to further evaluate for underlying malignant hematologic process. Waiting to be scheduled. I gave central scheduling number to the patient.   #Abdominal pain: --Uncertain etiology but differentials include moderate volume ascites seen on CT imaging from 06/17/2020. Will further evaluate with upcoming PET/CT scan.   --Recent history of IV heroin use and opiate overdose. During most recent hospitalization, there was discussion to start Suboxone but patient declined.  --He requested refill on oxycodone which I refused and recommended a consultation with  pain management.     Orders Placed This Encounter  Procedures  . CBC with Differential (Cancer Center Only)    Standing Status:   Future    Standing Expiration Date:   08/02/2021  . Ferritin    Standing Status:   Future    Standing Expiration Date:   08/02/2021  . Iron and TIBC    Standing Status:   Future    Standing Expiration Date:   08/02/2021  . Retic Panel    Standing Status:   Future  Standing Expiration Date:   08/02/2021    All questions were answered. The patient knows to call the clinic with any problems, questions or concerns.  I have spent a total of 35 minutes minutes of face-to-face and non-face-to-face time, preparing to see the patient, obtaining and/or reviewing separately obtained history, performing a medically appropriate examination, counseling and educating the patient, ordering medications/tests,documenting clinical information in the electronic health record, and care coordination.   Georga KaufmannIrene Damondre Pfeifle, PA-C Department [facility]ology[facility]cology [facility] Phone: [phone]

## 2020-08-03 ENCOUNTER — Telehealth: Payer: Self-pay

## 2020-08-03 NOTE — Telephone Encounter (Signed)
This LPN spoke with pt's mother, Evanna regarding appts for pt. Pt will have labs in 3 mos and is in need of PET scan but does not have insurance and pt is in the process of signing up for Medicaid. This LPN informed Evanna that scheduling would be in touch to schedule labs. Verbalized thanks and understanding.

## 2020-08-04 ENCOUNTER — Telehealth: Payer: Self-pay | Admitting: Physician Assistant

## 2020-08-04 NOTE — Telephone Encounter (Signed)
Release: [id number] Per Outgoing Referral Faxed medical records to [facility] @ fax # [phone]

## 2020-08-05 ENCOUNTER — Telehealth: Payer: Self-pay | Admitting: Physician Assistant

## 2020-08-05 NOTE — Telephone Encounter (Signed)
Scheduled per los. Called, line busy, no answer, not able to leave msg. Mailed printout

## 2020-08-09 ENCOUNTER — Inpatient Hospital Stay: Payer: Self-pay | Admitting: Internal Medicine

## 2020-08-19 ENCOUNTER — Ambulatory Visit: Payer: Self-pay | Admitting: Infectious Diseases

## 2020-08-22 ENCOUNTER — Other Ambulatory Visit: Payer: Self-pay

## 2020-08-22 ENCOUNTER — Other Ambulatory Visit: Payer: Self-pay | Admitting: Pharmacist

## 2020-08-22 ENCOUNTER — Ambulatory Visit (INDEPENDENT_AMBULATORY_CARE_PROVIDER_SITE_OTHER): Payer: Self-pay | Admitting: Infectious Diseases

## 2020-08-22 ENCOUNTER — Encounter: Payer: Self-pay | Admitting: Physician Assistant

## 2020-08-22 ENCOUNTER — Encounter: Payer: Self-pay | Admitting: Infectious Diseases

## 2020-08-22 VITALS — BP 128/87 | HR 107 | Temp 98.0°F | Wt 131.0 lb

## 2020-08-22 DIAGNOSIS — J439 Emphysema, unspecified: Secondary | ICD-10-CM

## 2020-08-22 DIAGNOSIS — B1910 Unspecified viral hepatitis B without hepatic coma: Secondary | ICD-10-CM

## 2020-08-22 DIAGNOSIS — S2220XS Unspecified fracture of sternum, sequela: Secondary | ICD-10-CM

## 2020-08-22 DIAGNOSIS — F1110 Opioid abuse, uncomplicated: Secondary | ICD-10-CM

## 2020-08-22 DIAGNOSIS — S2220XA Unspecified fracture of sternum, initial encounter for closed fracture: Secondary | ICD-10-CM | POA: Insufficient documentation

## 2020-08-22 DIAGNOSIS — R188 Other ascites: Secondary | ICD-10-CM

## 2020-08-22 DIAGNOSIS — S2243XS Multiple fractures of ribs, bilateral, sequela: Secondary | ICD-10-CM

## 2020-08-22 DIAGNOSIS — R591 Generalized enlarged lymph nodes: Secondary | ICD-10-CM

## 2020-08-22 DIAGNOSIS — J9602 Acute respiratory failure with hypercapnia: Secondary | ICD-10-CM

## 2020-08-22 MED ORDER — ALBUTEROL SULFATE HFA 108 (90 BASE) MCG/ACT IN AERS
2.0000 | INHALATION_SPRAY | Freq: Four times a day (QID) | RESPIRATORY_TRACT | 2 refills | Status: DC | PRN
  Filled 2020-08-22: qty 18, 25d supply, fill #0

## 2020-08-22 MED ORDER — BUDESONIDE-FORMOTEROL FUMARATE 160-4.5 MCG/ACT IN AERO: 2.0000 | INHALATION_SPRAY | Freq: Two times a day (BID) | RESPIRATORY_TRACT | 2 refills | Status: DC

## 2020-08-22 MED ORDER — ALBUTEROL SULFATE HFA 108 (90 BASE) MCG/ACT IN AERS: 2.0000 | INHALATION_SPRAY | Freq: Four times a day (QID) | RESPIRATORY_TRACT | 2 refills | Status: DC | PRN

## 2020-08-22 MED ORDER — BUDESONIDE-FORMOTEROL FUMARATE 160-4.5 MCG/ACT IN AERO
2.0000 | INHALATION_SPRAY | Freq: Two times a day (BID) | RESPIRATORY_TRACT | 2 refills | Status: DC
  Filled 2020-08-22: qty 10.2, 30d supply, fill #0
  Filled 2020-10-04 – 2020-10-10 (×2): qty 10.2, 30d supply, fill #1

## 2020-08-22 NOTE — Patient Instructions (Addendum)
For your x-ray it is over at [facility] - come in with the valet parking at the entrance off [address] (near the Urgent Care).  Check in with the volunteers to see if they can help you with a wheelchair to go to radiology for your x ray.  No need for an appointment - the order is there for you.   I sent in the prescription for your current inhaler and a new steroid inhaler for you.   The steroid inhaler, SYMBICORT, is to be used TWO puffs twice a day. If this is too expensive please call us so we can send to a different pharmacy to see if we can help get it cheaper or see if you qualify for patient assistance.    Please stop by the lab on your way out.    Pneumococcal Vaccine, Polyvalent solution for injection What is this medicine? PNEUMOCOCCAL VACCINE, POLYVALENT (NEU mo KOK al vak SEEN, pol ee VEY luhnt) is a vaccine to prevent pneumococcus bacteria infection. These bacteria are a major cause of ear infections, Strep throat infections, and serious pneumonia, meningitis, or blood infections worldwide. These vaccines help the body to produce antibodies (protective substances) that help your body defend against these bacteria. This vaccine is recommended for people 51 years of age and older with health problems. It is also recommended for all adults over 51 years old. This vaccine will not treat an infection. This medicine may be used for other purposes; ask your health care provider or pharmacist if you have questions. COMMON BRAND NAME(S): Pneumovax 23 What should I tell my health care provider before I take this medicine? They need to know if you have any of these conditions:  bleeding problems  bone marrow or organ transplant  cancer, Hodgkin's disease  fever  infection  immune system problems  low platelet count in the blood  seizures  an unusual or allergic reaction to pneumococcal vaccine, diphtheria toxoid, other vaccines, latex, other medicines, foods, dyes, or  preservatives  pregnant or trying to get pregnant  breast-feeding How should I use this medicine? This vaccine is for injection into a muscle or under the skin. It is given by a health care professional. A copy of Vaccine Information Statements will be given before each vaccination. Read this sheet carefully each time. The sheet may change frequently. Talk to your pediatrician regarding the use of this medicine in children. While this drug may be prescribed for children as young as 51 years of age for selected conditions, precautions do apply. Overdosage: If you think you have taken too much of this medicine contact a poison control center or emergency room at once. NOTE: This medicine is only for you. Do not share this medicine with others. What if I miss a dose? It is important not to miss your dose. Call your doctor or health care professional if you are unable to keep an appointment. What may interact with this medicine?  medicines for cancer chemotherapy  medicines that suppress your immune function  medicines that treat or prevent blood clots like warfarin, enoxaparin, and dalteparin  steroid medicines like prednisone or cortisone This list may not describe all possible interactions. Give your health care provider a list of all the medicines, herbs, non-prescription drugs, or dietary supplements you use. Also tell them if you smoke, drink alcohol, or use illegal drugs. Some items may interact with your medicine. What should I watch for while using this medicine? Mild fever and pain should go  away in 3 days or less. Report any unusual symptoms to your doctor or health care professional. What side effects may I notice from receiving this medicine? Side effects that you should report to your doctor or health care professional as soon as possible:  allergic reactions like skin rash, itching or hives, swelling of the face, lips, or tongue  breathing problems  confused  fever over  102 degrees F  pain, tingling, numbness in the hands or feet  seizures  unusual bleeding or bruising  unusual muscle weakness Side effects that usually do not require medical attention (report to your doctor or health care professional if they continue or are bothersome):  aches and pains  diarrhea  fever of 102 degrees F or less  headache  irritable  loss of appetite  pain, tender at site where injected  trouble sleeping This list may not describe all possible side effects. Call your doctor for medical advice about side effects. You may report side effects to FDA at 1-800-FDA-1088. Where should I keep my medicine? This does not apply. This vaccine is given in a clinic, pharmacy, doctor's office, or other health care setting and will not be stored at home. NOTE: This sheet is a summary. It may not cover all possible information. If you have questions about this medicine, talk to your doctor, pharmacist, or health care provider.  2021 Elsevier/Gold Standard (2007-10-10 14:32:37)

## 2020-08-22 NOTE — Assessment & Plan Note (Signed)
No findings concerning for volume overload on exam today.  He stopped his Lasix after we called and instructed him to do so 3 to 4 weeks ago.  Advised to continue to hold until he establishes care with PCP in July.

## 2020-08-22 NOTE — Assessment & Plan Note (Signed)
Awaiting scheduling for PET scan given concern for malignant process with splenomegaly and lymphadenopathy.

## 2020-08-22 NOTE — Assessment & Plan Note (Signed)
Maintained on Subutex for opioid replacement therapy from another provider with good effect. He has not used in the last 4 weeks.

## 2020-08-22 NOTE — Progress Notes (Signed)
Patient Name: David Short  Date of Birth: Feb 19, 1971  MRN: [id number]  PCP: Default, Provider, MD  Referring Provider: No ref. provider found, Ph#: N/A    CC:  Follow up hepatitis.    HPI/ROS:  David Short is a 50 y.o. male as a new patient visit to evaluate for treatment in the setting of hepatitis B. Hep C RNA (-). HIV non reactive.   [name] has been doing about the same since her last office visit 3 to 4 weeks ago.  He continues on oxygen daily.  Does very little in the house due to overall weakness and "decreased motivation."  Spends a majority of the time in better in a chair.  Uses a walker in the house.  Still living with his aunt with frequent touch points with his mother.  He has been maintained on Subutex and has not used heroin in 4 weeks.  Feels like this is working well for him.   He is having a lot of pain and swelling to the chest where he had CPR. At one point the bruise went all the way across his chest but it is getting smaller now. He still has a lot of swelling and "clicking pain" over the sternum when he coughs or deep breathes. Has a hard time lifting anything up.   He needs a refill of his rescue inhaler - uses it about 5-6 times a day for little movement around the house that winds him quickly. He is on his last full oxygen tank and will need to call to request refills. Stays around 2-4 LPM based on effort.   No longer taking any Lasix.  He does not think he is having any trouble with any swelling but not sure.   Review of Systems  Constitutional: Positive for activity change, fatigue and unexpected weight change. Negative for chills and fever.  HENT: Negative for tinnitus.   Eyes: Negative for photophobia.  Respiratory: Negative for cough.   Cardiovascular: Negative for chest pain (anterior chest pain with cough/inspiration).  Gastrointestinal: Negative for abdominal pain, diarrhea, nausea and vomiting.  Genitourinary: Negative for dysuria.   Skin: Negative for rash.  Neurological: Negative for headaches.  Psychiatric/Behavioral: Positive for dysphoric mood. Negative for sleep disturbance. The patient is not nervous/anxious.         Past Medical History:  Diagnosis Date  . CHF (congestive heart failure) (HCC)   . Chronic back pain   . COPD (chronic obstructive pulmonary disease) (HCC)   . IDA (iron deficiency anemia)   . IVDU (intravenous drug user)    heroin   . On home oxygen therapy   . Osteomyelitis (HCC)   . Splenomegaly   . Thrombocytopenia (HCC)   . Tobacco use     Prior to Admission medications   Medication Sig Start Date End Date Taking? Authorizing Provider  albuterol (VENTOLIN HFA) 108 (90 Base) MCG/ACT inhaler Inhale 2 puffs into the lungs every 6 (six) hours as needed for wheezing or shortness of breath. 06/26/20  Yes Rai, Ripudeep K, MD  furosemide (LASIX) 20 MG tablet Take 1 tablet (20 mg total) by mouth daily. 06/26/20 07/26/20 Yes Rai, Ripudeep K, MD  hydrocortisone (ANUSOL-HC) 25 MG suppository Place 1 suppository (25 mg total) rectally 2 (two) times daily. For hemorrhoids Patient not taking: Reported on 07/22/2020 06/26/20   Rai, Delene Ruffini, MD  pantoprazole (PROTONIX) 40 MG tablet Take 1 tablet (40 mg total) by mouth daily. Patient not taking: Reported on 07/22/2020  06/26/20   Cathren Harsh, MD    Allergies  Allergen Reactions  . Tylenol [Acetaminophen] Hives    Social History   Tobacco Use  . Smoking status: Current Every Day Smoker    Packs/day: 1.00    Types: Cigarettes  . Smokeless tobacco: Never Used  Vaping Use  . Vaping Use: Never used  Substance Use Topics  . Alcohol use: Not Currently  . Drug use: Not Currently    Types: IV, Heroin    Comment: IV heroin last use 07/21/20    Family History  Problem Relation Age of Onset  . Hypertension Sister   . Hypertension Brother   . Colon cancer Maternal Grandmother   . Prostate cancer Maternal Grandfather   . Colon cancer Maternal  Grandfather      Objective:   Vitals:   08/22/20 1059  BP: 128/87  Pulse: (!) 107  Temp: 98 F (36.7 C)  SpO2: 90%   Constitutional: older than stated age, thin appearing, malnourished man in no physical acute distress Eyes: anicteric Cardiovascular: Cor Tachy and No murmurs Respiratory: wheezing Gastrointestinal: Bowel sounds are normal, liver is not enlarged, spleen tip palpable. No ascites present.  Musculoskeletal: peripheral pulses normal, no pedal edema, no clubbing or cyanosis; chest with large purple/green bruise overlying sternum with swelling and concern for instability.  Skin: negative for - jaundice, spider hemangioma, telangiectasia, palmar erythema, ecchymosis and atrophy; no porphyria cutanea tarda. Multiple tract marks, old abscess scars and skin tears noted.  Lymphatic: no cervical lymphadenopathy   Laboratory: Genotype:  Lab Results  Component Value Date   HCVGENOTYPE Not Detected 07/15/2020   HCV viral load: No results found for: HCVQUANT Lab Results  Component Value Date   WBC 7.0 08/02/2020   HGB 12.1 (L) 08/02/2020   HCT 44.1 08/02/2020   MCV 88.6 08/02/2020   PLT 197 08/02/2020    Lab Results  Component Value Date   CREATININE 0.57 (L) 08/01/2020   BUN 17 08/01/2020   NA 139 08/01/2020   K 4.8 08/01/2020   CL 101 08/01/2020   CO2 32 08/01/2020    Lab Results  Component Value Date   ALT 143 (H) 07/22/2020   AST 111 (H) 07/22/2020   ALKPHOS 135 (H) 07/15/2020    Lab Results  Component Value Date   INR 1.1 07/22/2020   BILITOT 0.7 07/22/2020   ALBUMIN 4.4 07/15/2020     Imaging:  CT A/P 06/24/20: IMPRESSION: 1. Interval decrease in size of a small right pleural effusion. 2. Stable splenomegaly (18cm that is new from 2013). Question prominent retroperitoneal lymph nodes without definite lymphadenopathy. Findings suggestive of a lymphoproliferative disorder. Consider PET-CT for further evaluation. 3. Constipation. 4.  Aortic  Atherosclerosis (ICD10-I70.0) - mild.    Assessment & Plan:   Problem List Items Addressed This Visit      Unprioritized   Sternal fracture, suspected     Following episode of CPR r/t drug overdose > 41m ago.  He has a large bruise overlying the sternum with what appears to be potentially a displaced defect from CPR.  He reports a clicking when he takes deep breath or coughs.  Very painful for physical exam today.  We will send him over to: For two-view chest chest x-ray.  Discussed that he if he has sternal fracture that appears displaced would recommend referral to CT surgery for care.       Pulmonary emphysema (HCC)    He has been using his rescue inhaler  5-6 times a day unfortunately.  Its been to be another month before he gets into a PCP visit.  Given his COPD/emphysema seen on multiple studies will start Symbicort 160- 4.5, 2 puffs twice daily.  Advised to rinse his mouth after each use.  May need to send prescription over to community health and wellness versus [employer] patient assistance.  He will call us if he has trouble affording his medication. Refill of rescue inhaler.      Relevant Medications   albuterol (VENTOLIN HFA) 108 (90 Base) MCG/ACT inhaler   budesonide-formoterol (SYMBICORT) 160-4.5 MCG/ACT inhaler   Other ascites    No findings concerning for volume overload on exam today.  He stopped his Lasix after we called and instructed him to do so 3 to 4 weeks ago.  Advised to continue to hold until he establishes care with PCP in July.       Lymphadenopathy    Awaiting scheduling for PET scan given concern for malignant process with splenomegaly and lymphadenopathy.       Heroin abuse (HCC)    Maintained on Subutex for opioid replacement therapy from another provider with good effect. He has not used in the last 4 weeks.      Hepatitis B infection - Primary    I will repeat hepatitis B lab work and trend for spontaneous clearance versus initiation of antiviral  therapy.  Fibrotest for liver staging. No findings concerning for cirrhosis on recent CT scan imaging.       Relevant Orders   Hepatitis B e antigen   Hepatitis B DNA, ultraquantitative, PCR   COMPLETE METABOLIC PANEL WITH GFR   Liver Fibrosis, FibroTest-ActiTest   Acute respiratory failure with hypercapnia (HCC)    Oxygenation is stable today on 2 L with 90%.  He has adequate supplies at home and plans to call for refills after today's appointment         Will have him back in 3 months for monitoring pending labs from today. If we need to start antiviral medications for him will do so and adjust appointments appropriately.    Rexene Alberts, MSN, NP-C [facility] for Infectious Disease [facility]  [email or web address] Pager: [phone] Office: [phone] [facility] Main Line: [phone]

## 2020-08-22 NOTE — Assessment & Plan Note (Signed)
He has been using his rescue inhaler 5-6 times a day unfortunately.  Its been to be another month before he gets into a PCP visit.  Given his COPD/emphysema seen on multiple studies will start Symbicort 160- 4.5, 2 puffs twice daily.  Advised to rinse his mouth after each use.  May need to send prescription over to community health and wellness versus [employer] patient assistance.  He will call us if he has trouble affording his medication. Refill of rescue inhaler.

## 2020-08-22 NOTE — Assessment & Plan Note (Addendum)
I will repeat hepatitis B lab work and trend for spontaneous clearance versus initiation of antiviral therapy.  Fibrotest for liver staging. No findings concerning for cirrhosis on recent CT scan imaging.

## 2020-08-22 NOTE — Assessment & Plan Note (Signed)
Following episode of CPR r/t drug overdose > 45m ago.  He has a large bruise overlying the sternum with what appears to be potentially a displaced defect from CPR.  He reports a clicking when he takes deep breath or coughs.  Very painful for physical exam today.  We will send him over to: For two-view chest chest x-ray.  Discussed that he if he has sternal fracture that appears displaced would recommend referral to CT surgery for care.

## 2020-08-22 NOTE — Assessment & Plan Note (Signed)
Oxygenation is stable today on 2 L with 90%.  He has adequate supplies at home and plans to call for refills after today's appointment

## 2020-08-24 ENCOUNTER — Other Ambulatory Visit (HOSPITAL_COMMUNITY): Payer: Self-pay

## 2020-08-26 LAB — LIVER FIBROSIS, FIBROTEST-ACTITEST
ALT: 137 U/L — ABNORMAL HIGH (ref 9–46)
Alpha-2-Macroglobulin: 154 mg/dL (ref 106–279)
Apolipoprotein A1: 90 mg/dL — ABNORMAL LOW (ref 94–176)
Bilirubin: 0.5 mg/dL (ref 0.2–1.2)
Fibrosis Score: 0.32
GGT: 46 U/L (ref 3–95)
Haptoglobin: 104 mg/dL (ref 43–212)
Necroinflammat ACT Score: 0.7
Reference ID: 3899384

## 2020-08-26 LAB — COMPLETE METABOLIC PANEL WITH GFR
AG Ratio: 0.9 (calc) — ABNORMAL LOW (ref 1.0–2.5)
ALT: 134 U/L — ABNORMAL HIGH (ref 9–46)
AST: 137 U/L — ABNORMAL HIGH (ref 10–35)
Albumin: 3.4 g/dL — ABNORMAL LOW (ref 3.6–5.1)
Alkaline phosphatase (APISO): 121 U/L (ref 35–144)
BUN/Creatinine Ratio: 19 (calc) (ref 6–22)
BUN: 12 mg/dL (ref 7–25)
CO2: 38 mmol/L — ABNORMAL HIGH (ref 20–32)
Calcium: 9.3 mg/dL (ref 8.6–10.3)
Chloride: 98 mmol/L (ref 98–110)
Creat: 0.64 mg/dL — ABNORMAL LOW (ref 0.70–1.33)
GFR, Est African American: 132 mL/min/{1.73_m2} (ref >=60)
GFR, Est Non African American: 114 mL/min/{1.73_m2} (ref >=60)
Globulin: 3.9 g/dL (calc) — ABNORMAL HIGH (ref 1.9–3.7)
Glucose, Bld: 80 mg/dL (ref 65–99)
Potassium: 4.3 mmol/L (ref 3.5–5.3)
Sodium: 141 mmol/L (ref 135–146)
Total Bilirubin: 0.5 mg/dL (ref 0.2–1.2)
Total Protein: 7.3 g/dL (ref 6.1–8.1)

## 2020-08-26 LAB — HEPATITIS B E ANTIGEN: Hep B E Ag: REACTIVE — AB

## 2020-08-26 LAB — HEPATITIS B DNA, ULTRAQUANTITATIVE, PCR: Hepatitis B DNA (Calc): >9 Log IU/mL — ABNORMAL HIGH

## 2020-08-29 ENCOUNTER — Telehealth: Payer: Self-pay

## 2020-08-29 NOTE — Telephone Encounter (Signed)
[facility] Patient Advocate Encounter ? ?Completed and sent Support Path application for Vemlidy for this patient who is uninsured.   ? ?Patient assistance phone number for follow up is [phone].  ? ?This encounter will be updated until final determination.  ? ?Jahniah Pallas, CPhT ?Specialty Pharmacy Patient Advocate ?[facility] ?Phone: [phone] ?Fax:  [phone]  ?

## 2020-09-12 ENCOUNTER — Encounter: Payer: Self-pay | Admitting: Physician Assistant

## 2020-09-12 ENCOUNTER — Telehealth: Payer: Self-pay

## 2020-09-12 ENCOUNTER — Other Ambulatory Visit: Payer: Self-pay | Admitting: Pharmacist

## 2020-09-12 ENCOUNTER — Other Ambulatory Visit (HOSPITAL_COMMUNITY): Payer: Self-pay

## 2020-09-12 DIAGNOSIS — B1910 Unspecified viral hepatitis B without hepatic coma: Secondary | ICD-10-CM

## 2020-09-12 MED ORDER — VEMLIDY 25 MG PO TABS
1.0000 | ORAL_TABLET | Freq: Every day | ORAL | 5 refills | Status: AC
  Filled 2020-09-12 – 2020-09-13 (×8): qty 30, 30d supply, fill #0
  Filled 2020-10-04 – 2020-10-10 (×2): qty 30, 30d supply, fill #1
  Filled 2020-11-08: qty 30, 30d supply, fill #2
  Filled 2020-12-02: qty 30, 30d supply, fill #3
  Filled 2020-12-29: qty 30, 30d supply, fill #4
  Filled 2021-01-26: qty 30, 30d supply, fill #5

## 2020-09-12 NOTE — Progress Notes (Signed)
Patient is approved to receive Vemlidy. Will send to [facility], and they will mail it to his house.

## 2020-09-12 NOTE — Telephone Encounter (Signed)
[facility] Patient Advocate Encounter  Completed and sent Support Path application for [facility] for this patient who is uninsured.    Patient is approved 09/06/20 through 09/06/21.  BIN      [id number] PCN    [id number] GRP    [id number] ID        [id number]  Script will be filled at [facility].    Clearance Coots , CPhT Specialty Pharmacy Patient [facility] for [facility] Phone: [phone] Fax:  [phone]

## 2020-09-13 ENCOUNTER — Other Ambulatory Visit (HOSPITAL_COMMUNITY): Payer: Self-pay

## 2020-09-16 ENCOUNTER — Other Ambulatory Visit: Payer: Self-pay

## 2020-09-16 ENCOUNTER — Ambulatory Visit: Payer: Self-pay | Attending: Internal Medicine | Admitting: Internal Medicine

## 2020-09-16 VITALS — BP 121/82 | HR 86 | Resp 16 | Ht 69.0 in | Wt 129.6 lb

## 2020-09-16 DIAGNOSIS — Z7689 Persons encountering health services in other specified circumstances: Secondary | ICD-10-CM

## 2020-09-16 DIAGNOSIS — I2729 Other secondary pulmonary hypertension: Secondary | ICD-10-CM

## 2020-09-16 DIAGNOSIS — Z9981 Dependence on supplemental oxygen: Secondary | ICD-10-CM

## 2020-09-16 DIAGNOSIS — F1111 Opioid abuse, in remission: Secondary | ICD-10-CM

## 2020-09-16 DIAGNOSIS — J449 Chronic obstructive pulmonary disease, unspecified: Secondary | ICD-10-CM

## 2020-09-16 DIAGNOSIS — F17200 Nicotine dependence, unspecified, uncomplicated: Secondary | ICD-10-CM | POA: Insufficient documentation

## 2020-09-16 DIAGNOSIS — J9611 Chronic respiratory failure with hypoxia: Secondary | ICD-10-CM

## 2020-09-16 DIAGNOSIS — I50810 Right heart failure, unspecified: Secondary | ICD-10-CM | POA: Insufficient documentation

## 2020-09-16 DIAGNOSIS — D508 Other iron deficiency anemias: Secondary | ICD-10-CM

## 2020-09-16 DIAGNOSIS — E46 Unspecified protein-calorie malnutrition: Secondary | ICD-10-CM

## 2020-09-16 DIAGNOSIS — R590 Localized enlarged lymph nodes: Secondary | ICD-10-CM

## 2020-09-16 DIAGNOSIS — B181 Chronic viral hepatitis B without delta-agent: Secondary | ICD-10-CM

## 2020-09-16 MED ORDER — NICOTINE 21 MG/24HR TD PT24
21.0000 mg | MEDICATED_PATCH | Freq: Every day | TRANSDERMAL | 1 refills | Status: DC
  Filled 2020-09-16: qty 28, 28d supply, fill #0

## 2020-09-16 NOTE — Progress Notes (Signed)
Patient ID: David Short, male    DOB: Nov 13, 1970  MRN: [id number]  CC: Hospitalization Follow-up   Subjective: David Short is a 50 y.o. male who presents for hosp f/u and to est care His concerns today include:  Patient with history of RT sided heart failure, pul HTN, HTN, COPD, hypoxic respiratory failure on home O2, tob dep, chronic hepatitis C and B, IDA, polysubstance abuse, malnutrition vertebral osteomyelitis, cervical discectomy  Patient presents to establish care and hospital follow-up.  He has had 3 hospital admissions since April.  Hospitalized 4/1-12/2020 with acute on chronic CHF with right heart failure and pulmonary hypertension.  He was found to be hypoxic and hypercapnic requiring O2.  Echo revealed EF of 65 to 70% with moderately elevated pulmonary artery pressure of 52 and small pleural effusion.  Also found to have COPD exacerbation and was treated appropriately.  Found to have anemia and was transfused 1 unit of packed red blood cells.  CT of the abdomen showed splenomegaly, prominent retroperitoneal lymph nodes without definite lymphadenopathy.  It was recommended that he get a PET scan as an outpatient and he was referred to hematology.  He was also referred to gastroenterology to be considered for endoscopy given the anemia.  Post hospital, he was seen by hematology/oncology PA on 06/28/2020 for the anemia, thrombocytopenia, splenomegaly and retroperitoneal lymphadenopathy.  Her assessment was that liver disease was likely causing the splenomegaly and that he may have varices causing slow leaking of blood causing the iron deficiency anemia.  She recommended PET CT to rule out hematologic malignancy.  He tested negative for HIV.  Seen by GI Dr. Myrtie Neither 07/15/2020.  He stated that EGD and colonoscopy was reasonable but patient was currently in no condition for having the procedures done.  Patient readmitted to the hospital 4/29-07/17/2020 with heroin overdose.   Subsequently referred to Suboxone clinic.  Has been seen by infectious disease.  He has chronic hepatitis B.  Looks like he had hepatitis C that has cleared as hepatitis C RNA was not detected.  Today: COPD/tobacco/chronic hypoxia: He continues to smoke.  1 pack lasts 2 to 3 days.  He wants to quit.  Used to smoke a pack a day for quite a number of years.  He was given the nicotine patches while in the hospital and found it helpful in decreasing his cravings.  He would like to be prescribed the patches. -He knows not to smoke around his oxygen tank. -Using Symbicort twice a day and finds it to be helpful. On 3 L of O2 continuously.  Substance abuse: Currently goes to the Suboxone clinic and is on Subutex.  He states that he has been clean of all drugs including cocaine, marijuana and heroin for about 3 weeks.  Clean of alcohol since 2005.  Iron deficiency anemia: He is taking iron consistently.  Recent CBC that was repeated by hematology showed his hemoglobin had increased from 8.9 back in April now to 12.1.  He denies seeing any blood in the stools.  He has never had a colonoscopy.  He is uninsured.  In regards to the retroperitoneal and mediastinal lymphadenopathy, the oncology PA had ordered a PET scan but looks like it was never scheduled.  Patient is willing to have it done.  He denies any night sweats.  He lives with his aunt.  He eats 2 meals a day and snacks in between.  Weight has been holding steady between 127-131 since the end of April.  His  albumin level has increased from 2.7 to 3.7 the end of May.  BMI is between 18-19.4.    Patient Active Problem List   Diagnosis Date Noted   Pulmonary emphysema (HCC) 08/22/2020   Sternal fracture, suspected  08/22/2020   IV drug abuse (HCC) 07/22/2020   History of hepatitis C 07/22/2020   Hepatitis B infection 07/22/2020   Acute respiratory failure with hypercapnia (HCC) 07/20/2020   Heroin abuse (HCC) 07/20/2020   Cocaine abuse (HCC)  07/20/2020   Overdose, accidental or unintentional, initial encounter 07/15/2020   Lymphadenopathy 06/28/2020   Abdominal pain 06/28/2020   Other ascites    Iron deficiency anemia    CHF (congestive heart failure) (HCC) 06/18/2020   Vertebral osteomyelitis, acute-C5-C6 12/26/2018   Malnutrition of moderate degree 12/15/2018   S/P cervical spinal fusion 12/14/2018   Accelerated hypertension 12/12/2018   Thrombocytopenia (HCC) 12/12/2018     Current Outpatient Medications on File Prior to Visit  Medication Sig Dispense Refill   albuterol (VENTOLIN HFA) 108 (90 Base) MCG/ACT inhaler Inhale 2 puffs into the lungs every 6 (six) hours as needed for wheezing or shortness of breath. 18 g 2   budesonide-formoterol (SYMBICORT) 160-4.5 MCG/ACT inhaler Inhale 2 puffs into the lungs in the morning and at bedtime. 10.2 g 2   ferrous sulfate 325 (65 FE) MG EC tablet Take 1 tablet (325 mg total) by mouth daily with breakfast. 30 tablet 3   furosemide (LASIX) 20 MG tablet Take 1 tablet (20 mg total) by mouth daily. 30 tablet 0   hydrocortisone (ANUSOL-HC) 25 MG suppository Place 1 suppository (25 mg total) rectally 2 (two) times daily. For hemorrhoids 100 suppository 0   pantoprazole (PROTONIX) 40 MG tablet Take 1 tablet (40 mg total) by mouth daily. 30 tablet 1   Tenofovir Alafenamide Fumarate (VEMLIDY) 25 MG TABS Take 1 tablet (25 mg total) by mouth daily. 30 tablet 5   No current facility-administered medications on file prior to visit.    Allergies  Allergen Reactions   Tylenol [Acetaminophen] Hives    Social History   Socioeconomic History   Marital status: Single    Spouse name: Not on file   Number of children: Not on file   Years of education: Not on file   Highest education level: Not on file  Occupational History   Not on file  Tobacco Use   Smoking status: Every Day    Packs/day: 1.00    Pack years: 0.00    Types: Cigarettes   Smokeless tobacco: Never  Vaping Use   Vaping  Use: Never used  Substance and Sexual Activity   Alcohol use: Not Currently   Drug use: Not Currently    Types: IV, Heroin    Comment: IV heroin last use 07/21/20   Sexual activity: Not on file  Other Topics Concern   Not on file  Social History Narrative   Not on file   Social Determinants of Health   Financial Resource Strain: Not on file  Food Insecurity: Not on file  Transportation Needs: Not on file  Physical Activity: Not on file  Stress: Not on file  Social Connections: Not on file  Intimate Partner Violence: Not on file    Family History  Problem Relation Age of Onset   Hypertension Sister    Hypertension Brother    Colon cancer Maternal Grandmother    Prostate cancer Maternal Grandfather    Colon cancer Maternal Grandfather     Past Surgical History:  Procedure Laterality Date   ANTERIOR CERVICAL DECOMP/DISCECTOMY FUSION N/A 12/14/2018   Procedure: ANTERIOR CERVICAL DISCECTOMY FUSION CERVICAL FIVE- CERVICAL SIX;  Surgeon: Tia AlertJones, David S, MD;  [facility];  Service: Neurosurgery;  Laterality: N/A;   BACK SURGERY     states never had surgery   FRACTURE SURGERY Left    elbow/wrist/ankle    ROS: Review of Systems Negative except as stated above  PHYSICAL EXAM: BP 121/82   Pulse 86   Resp 16   Ht 5\' 9"  (1.753 m)   Wt 129 lb 9.6 oz (58.8 kg)   SpO2 92%   BMI 19.14 kg/m   On 3 liters Physical Exam   General appearance -Caucasian male who looks older than stated age and appears malnourished.  He has his oxygen on. Mental status - normal mood, behavior, speech, dress, motor activity, and thought processes Eyes - pupils equal and reactive, extraocular eye movements intact Mouth - mucous membranes moist, pharynx normal without lesions Neck - supple, no significant adenopathy Chest -breath sounds moderately decreased bilaterally with poor air entry Heart - normal rate, regular rhythm, normal S1, S2, no murmurs, rubs, clicks or gallops Extremities -  no  lower extremity edema.  He has wasting in his lower extremities.  CMP Latest Ref Rng & Units 08/22/2020 08/22/2020 08/01/2020  Glucose 65 - 99 mg/dL 80 - 67  BUN 7 - 25 mg/dL 12 - 17  Creatinine 1.610.70 - 1.33 mg/dL 0.96(E0.64(L) - 4.54(U0.57(L)  Sodium 135 - 146 mmol/L 141 - 139  Potassium 3.5 - 5.3 mmol/L 4.3 - 4.8  Chloride 98 - 110 mmol/L 98 - 101  CO2 20 - 32 mmol/L 38(H) - 32  Calcium 8.6 - 10.3 mg/dL 9.3 - 9.3  Total Protein 6.1 - 8.1 g/dL 7.3 - -  Total Bilirubin 0.2 - 1.2 mg/dL 0.5 - -  Alkaline Phos 38 - 126 U/L - - -  AST 10 - 35 U/L 137(H) - -  ALT 9 - 46 U/L 134(H) 137(H) -   Lipid Panel  No results found for: CHOL, TRIG, HDL, CHOLHDL, VLDL, LDLCALC, LDLDIRECT  CBC    Component Value Date/Time   WBC 7.0 08/02/2020 1242   WBC 6.7 07/22/2020 0000   RBC 4.98 08/02/2020 1242   RBC 4.94 08/02/2020 1241   HGB 12.1 (L) 08/02/2020 1242   HCT 44.1 08/02/2020 1242   PLT 197 08/02/2020 1242   MCV 88.6 08/02/2020 1242   MCH 24.3 (L) 08/02/2020 1242   MCHC 27.4 (L) 08/02/2020 1242   RDW 24.5 (H) 08/02/2020 1242   LYMPHSABS 0.8 08/02/2020 1242   MONOABS 0.7 08/02/2020 1242   EOSABS 0.2 08/02/2020 1242   BASOSABS 0.0 08/02/2020 1242    ASSESSMENT AND PLAN: 1. Encounter to establish care Encourage patient to apply for the orange card/cone discount card.  2. COPD, severe (HCC) Strongly advised to quit.  Continue Symbicort.  I have our schedulers get him in with Dr. Delford FieldWright to help with management since he is uninsured.   Advised not to smoke around his oxygen tank Tobacco dependence Pt is current smoker. Patient advised to quit smoking. Discussed health risks associated with smoking including lung and other types of cancers, worsening of his chronic lung diseases and CV risks.. Pt ready to give trail of quitting.   Discussed methods to help quit including quitting cold [place], use of NRT, Chantix and Bupropion.  Pt wanting to try: Nicotine patches.  We will do the stepdown approach.   Prescription  sent to his pharmacy. _3 minutes_ Minutes spent on counseling. F/U: Assess his progress on next visit.   3. Chronic respiratory failure with hypoxia, on home oxygen therapy (HCC) Due to COPD and pulmonary hypertension  4. 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 History of heroin abuse (HCC) Commended him on quitting.  He will continue in the Suboxone program  6. Other iron deficiency anemia Once he has the orange card/cone discount, I will refer him back to Dr. Myrtie Neither for EGD/colonoscopy  7. Protein malnutrition (HCC)   8. Chronic hepatitis B (HCC) Followed by and being treated through ID  9. Retroperitoneal lymphadenopathy  - NM PET NOPR Skull Base To Thigh; Future  10. Mediastinal lymphadenopathy - NM PET NOPR Skull Base To Thigh; Future     Patient was given the opportunity to ask questions.  Patient verbalized understanding of the plan and was able to repeat key elements of the plan.   No orders of the defined types were placed in this encounter.    Requested Prescriptions    No prescriptions requested or ordered in this encounter    No follow-ups on file.  Jonah Blue, MD, FACP

## 2020-09-20 ENCOUNTER — Other Ambulatory Visit: Payer: Self-pay

## 2020-09-28 ENCOUNTER — Encounter: Payer: Self-pay | Admitting: Physician Assistant

## 2020-09-28 ENCOUNTER — Telehealth: Payer: Self-pay | Admitting: Internal Medicine

## 2020-09-28 DIAGNOSIS — R590 Localized enlarged lymph nodes: Secondary | ICD-10-CM

## 2020-09-28 NOTE — Telephone Encounter (Signed)
Copied from CRM [id number]. Topic: General - Other >> Sep 21, 2020  3:15 PM Jaquita Rector A wrote: Reason for CRM: [facility] called in to inform Dr Laural Benes that patient will be in on 10/05/20 but they need the orders to be changed from [facility] to Thigh to say [facility] Pet Scan Skull Base to Thigh Initial. Need theses changes ASAP please

## 2020-09-28 NOTE — Telephone Encounter (Signed)
Will forward to provider  

## 2020-10-04 ENCOUNTER — Other Ambulatory Visit (HOSPITAL_COMMUNITY): Payer: Self-pay

## 2020-10-05 ENCOUNTER — Ambulatory Visit (HOSPITAL_COMMUNITY): Admission: RE | Admit: 2020-10-05 | Payer: Self-pay | Source: Ambulatory Visit

## 2020-10-05 ENCOUNTER — Ambulatory Visit (HOSPITAL_COMMUNITY): Payer: Self-pay

## 2020-10-10 ENCOUNTER — Encounter: Payer: Self-pay | Admitting: Physician Assistant

## 2020-10-10 ENCOUNTER — Other Ambulatory Visit (HOSPITAL_COMMUNITY): Payer: Self-pay

## 2020-10-11 ENCOUNTER — Other Ambulatory Visit: Payer: Self-pay

## 2020-10-11 ENCOUNTER — Encounter: Payer: Self-pay | Admitting: Critical Care Medicine

## 2020-10-11 ENCOUNTER — Encounter: Payer: Self-pay | Admitting: Physician Assistant

## 2020-10-11 ENCOUNTER — Ambulatory Visit: Payer: MEDICAID | Attending: Critical Care Medicine | Admitting: Critical Care Medicine

## 2020-10-11 DIAGNOSIS — F1910 Other psychoactive substance abuse, uncomplicated: Secondary | ICD-10-CM

## 2020-10-11 DIAGNOSIS — I509 Heart failure, unspecified: Secondary | ICD-10-CM

## 2020-10-11 DIAGNOSIS — B1910 Unspecified viral hepatitis B without hepatic coma: Secondary | ICD-10-CM

## 2020-10-11 DIAGNOSIS — F17200 Nicotine dependence, unspecified, uncomplicated: Secondary | ICD-10-CM

## 2020-10-11 DIAGNOSIS — J449 Chronic obstructive pulmonary disease, unspecified: Secondary | ICD-10-CM

## 2020-10-11 DIAGNOSIS — J439 Emphysema, unspecified: Secondary | ICD-10-CM

## 2020-10-11 MED ORDER — FUROSEMIDE 20 MG PO TABS
20.0000 mg | ORAL_TABLET | Freq: Every day | ORAL | 2 refills | Status: AC
  Filled 2020-10-11: qty 30, 30d supply, fill #0
  Filled 2020-12-02: qty 30, 30d supply, fill #1

## 2020-10-11 MED ORDER — AZITHROMYCIN 250 MG PO TABS
ORAL_TABLET | ORAL | 0 refills | Status: AC
  Filled 2020-10-11: qty 6, 5d supply, fill #0

## 2020-10-11 MED ORDER — PREDNISONE 10 MG PO TABS
ORAL_TABLET | ORAL | 0 refills | Status: AC
  Filled 2020-10-11: qty 20, 5d supply, fill #0

## 2020-10-11 MED ORDER — BUDESONIDE-FORMOTEROL FUMARATE 160-4.5 MCG/ACT IN AERO
2.0000 | INHALATION_SPRAY | Freq: Two times a day (BID) | RESPIRATORY_TRACT | 2 refills | Status: AC
Start: 2020-10-11 — End: ?
  Filled 2020-10-11: qty 10.2, 30d supply, fill #0
  Filled 2020-12-02 – 2021-01-09 (×3): qty 10.2, 30d supply, fill #1

## 2020-10-11 MED ORDER — ALBUTEROL SULFATE HFA 108 (90 BASE) MCG/ACT IN AERS
2.0000 | INHALATION_SPRAY | Freq: Four times a day (QID) | RESPIRATORY_TRACT | 2 refills | Status: AC | PRN
Start: 2020-10-11 — End: ?
  Filled 2020-10-11: qty 18, 25d supply, fill #0
  Filled 2020-12-02 – 2021-01-09 (×3): qty 18, 25d supply, fill #1

## 2020-10-11 MED ORDER — NICOTINE 21 MG/24HR TD PT24
21.0000 mg | MEDICATED_PATCH | Freq: Every day | TRANSDERMAL | 1 refills | Status: AC
  Filled 2020-10-11: qty 28, 28d supply, fill #0
  Filled 2020-12-02: qty 28, 28d supply, fill #1

## 2020-10-11 NOTE — Assessment & Plan Note (Addendum)
Uncontrolled with persistent productive cough and yellow phlegm. Currently on 2 L oxygen and encouraged patient to be at 3 L. Albuterol and Symbicort inhalers were refilled in addition to prednisone dose pack and azithromycin to be mailed to his home. SP02 at home was 89-91%   Strongly encouraged tobacco cessation

## 2020-10-11 NOTE — Assessment & Plan Note (Signed)
Furosemide was refilled. Patient will return to clinic for face to face visit in August

## 2020-10-11 NOTE — Progress Notes (Signed)
Subjective:    Patient ID: David Short, male    DOB: May 24, 1970, 50 y.o.   MRN: [id number] Virtual Visit via Telephone Note  I connected with David Short on 10/11/20 at  2:30 PM EDT by telephone and verified that I am speaking with the correct person using two identifiers.   Consent:  I discussed the limitations, risks, security and privacy concerns of performing an evaluation and management service by telephone and the availability of in person appointments. I also discussed with the patient that there may be a patient responsible charge related to this service. The patient expressed understanding and agreed to proceed.  Location of patient: Patient is at home  Location of provider: I am in my office  Persons participating in the televisit with the patient.   No one else on the call    History of Present Illness:   50 y.o.M hx COPD ref by Laural Benes  10/11/2020   David Short is a new patient to consult COPD via phone visit. As of now he only has his albuterol inhaler which he is using 3-4 times per day and 1-2 times at night. He is running low on his home oxygen and is currently on 2 L but is supposed to be on 3. He has a home pulse oximeter that read 89-91% during he visit. He gets SOB easily with exertion and often has to stop, use his inhaler and rest with relief. See the shortness of breath form below for more details relating to HPI   He does not have any nicotine patches and reports smoking 1 pack of cigarettes per every 3 days. He is trying to cut back. He also states that he is "doing his best" to abstain from cocaine and heroin use. He is receiving treatment at the [facility] clinic and is on 16 mg per day. He has not attended any narcotics anonymous meetings as he is limited by his breathing, oxygen supplies and transportation.    He is currently being managed by infectious disease for chronic hepatitis B. He is currently prescribed Vemlidy 25mg .   Shortness of  Breath This is a chronic problem. The current episode started more than 1 year ago. The problem occurs constantly. The problem has been rapidly worsening. Associated symptoms include rhinorrhea, a sore throat, sputum production and wheezing. Pertinent negatives include no chest pain, ear pain, fever, headaches, hemoptysis, leg swelling, syncope or vomiting. The symptoms are aggravated by any activity. Associated symptoms comments: Bad teeth Prod thick yellow mucus. He has tried beta agonist inhalers, rest and oral steroids for the symptoms. His past medical history is significant for COPD. There is no history of allergies or a heart failure.   Past Medical History:  Diagnosis Date   CHF (congestive heart failure) (HCC)    Chronic back pain    COPD (chronic obstructive pulmonary disease) (HCC)    IDA (iron deficiency anemia)    IVDU (intravenous drug user)    heroin    On home oxygen therapy    Osteomyelitis (HCC)    Splenomegaly    Thrombocytopenia (HCC)    Tobacco use      Family History  Problem Relation Age of Onset   Hypertension Sister    Hypertension Brother    Colon cancer Maternal Grandmother    Prostate cancer Maternal Grandfather    Colon cancer Maternal Grandfather      Social History   Socioeconomic History   Marital status: Single    Spouse name:  Not on file   Number of children: Not on file   Years of education: Not on file   Highest education level: Not on file  Occupational History   Not on file  Tobacco Use   Smoking status: Every Day    Packs/day: 1.00    Types: Cigarettes   Smokeless tobacco: Never  Vaping Use   Vaping Use: Never used  Substance and Sexual Activity   Alcohol use: Not Currently   Drug use: Not Currently    Types: IV, Heroin    Comment: IV heroin last use 07/21/20   Sexual activity: Not on file  Other Topics Concern   Not on file  Social History Narrative   Not on file   Social Determinants of Health   Financial Resource  Strain: Not on file  Food Insecurity: Not on file  Transportation Needs: Not on file  Physical Activity: Not on file  Stress: Not on file  Social Connections: Not on file  Intimate Partner Violence: Not on file     Allergies  Allergen Reactions   Tylenol [Acetaminophen] Hives     Outpatient Medications Prior to Visit  Medication Sig Dispense Refill   Buprenorphine HCl-Naloxone HCl (SUBOXONE) 8-2 MG FILM Place 2 Film under the tongue daily.     carvedilol (COREG) 3.125 MG tablet Take 3.125 mg by mouth 2 (two) times daily with a meal.     ferrous sulfate 325 (65 FE) MG EC tablet Take 1 tablet (325 mg total) by mouth daily with breakfast. 30 tablet 3   pantoprazole (PROTONIX) 40 MG tablet Take 1 tablet (40 mg total) by mouth daily. 30 tablet 1   Tenofovir Alafenamide Fumarate (VEMLIDY) 25 MG TABS Take 1 tablet (25 mg total) by mouth daily. 30 tablet 5   albuterol (VENTOLIN HFA) 108 (90 Base) MCG/ACT inhaler Inhale 2 puffs into the lungs every 6 (six) hours as needed for wheezing or shortness of breath. 18 g 2   furosemide (LASIX) 20 MG tablet Take 20 mg by mouth daily.     budesonide-formoterol (SYMBICORT) 160-4.5 MCG/ACT inhaler Inhale 2 puffs into the lungs in the morning and at bedtime. (Patient not taking: Reported on 10/11/2020) 10.2 g 2   hydrocortisone (ANUSOL-HC) 25 MG suppository Place 1 suppository (25 mg total) rectally 2 (two) times daily. For hemorrhoids 100 suppository 0   nicotine (NICODERM CQ - DOSED IN MG/24 HOURS) 21 mg/24hr patch Place 1 patch (21 mg total) onto the skin daily. (Patient not taking: Reported on 10/11/2020) 28 patch 1   No facility-administered medications prior to visit.     Review of Systems  Constitutional:  Negative for fever.  HENT:  Positive for congestion, dental problem, hearing loss, postnasal drip, rhinorrhea, sinus pressure, sinus pain, sneezing and sore throat. Negative for ear discharge and ear pain.   Eyes:  Positive for discharge and  itching.  Respiratory:  Positive for cough, sputum production, chest tightness, shortness of breath and wheezing. Negative for hemoptysis.   Cardiovascular:  Negative for chest pain, leg swelling and syncope.  Gastrointestinal:  Negative for vomiting.  Genitourinary: Negative.   Musculoskeletal: Negative.   Skin: Negative.   Neurological:  Negative for headaches.  Psychiatric/Behavioral: Negative.        Objective:   Physical Exam  No physical exam was performed as the visit was conducted over the phone      Assessment & Plan:  I personally reviewed all images and lab data in the [facility] system as well  as any outside material available during this office visit and agree with the  radiology impressions.   Tobacco dependence Strongly encouraged patient to stop smoking. He was prescribed nicotine patches but has not gotten them filled. Changed prescription to be sent to his home.      Current smoking consumption amount: 1 pk every 3-4 days  Dicsussion on advise to quit smoking and smoking impacts: cv lung impacts  Patient's willingness to quit:  Not established  Methods to quit smoking discussed:  Nicotine replacement  Medication management of smoking session drugs discussed:  Patch nicotine  Setting quit date  Not est  Follow-up arranged 92mo   Time spent counseling the patient:    COPD, severe (HCC) Uncontrolled with persistent productive cough and yellow phlegm. Currently on 2 L oxygen and encouraged patient to be at 3 L. Albuterol and Symbicort inhalers were refilled in addition to prednisone dose pack and azithromycin to be mailed to his home. SP02 at home was 89-91%   Strongly encouraged tobacco cessation   Hepatitis B infection Managed by ID- currently prescribed Vemlidy.   CHF (congestive heart failure) (HCC) Furosemide was refilled. Patient will return to clinic for face to face visit in August  IV drug abuse [facility]) Currently on Suboxone 16 mg but not  attending NA. Encouraged patient to remain abstinent of all illegal substances.   Diagnoses and all orders for this visit:  Pulmonary emphysema, unspecified emphysema type (HCC) -     albuterol (VENTOLIN HFA) 108 (90 Base) MCG/ACT inhaler; Inhale 2 puffs into the lungs every 6 (six) hours as needed for wheezing or shortness of breath. -     budesonide-formoterol (SYMBICORT) 160-4.5 MCG/ACT inhaler; Inhale 2 puffs into the lungs in the morning and at bedtime.  Tobacco dependence  COPD, severe (HCC)  Hepatitis B infection without delta agent without hepatic coma, unspecified chronicity  Congestive heart failure, unspecified HF chronicity, unspecified heart failure type (HCC)  IV drug abuse (HCC)  Other orders -     furosemide (LASIX) 20 MG tablet; Take 1 tablet (20 mg total) by mouth daily. -     nicotine (NICODERM CQ - DOSED IN MG/24 HOURS) 21 mg/24hr patch; Place 1 patch (21 mg total) onto the skin daily. -     predniSONE (DELTASONE) 10 MG tablet; Take 4 tablets daily for 5 days then stop -     azithromycin (ZITHROMAX) 250 MG tablet; Take two once as 1 dose, then one daily until gone   Follow Up Instructions: The patient knows a direct exam will be scheduled face-to-face towards the end of August and his medications will be mailed to his home   I discussed the assessment and treatment plan with the patient. The patient was provided an opportunity to ask questions and all were answered. The patient agreed with the plan and demonstrated an understanding of the instructions.   The patient was advised to call back or seek an in-person evaluation if the symptoms worsen or if the condition fails to improve as anticipated.  I provided 31 minutes of non-face-to-face time during this encounter  including  median intraservice time , review of notes, labs, imaging, medications  and explaining diagnosis and management to the patient .    Shan Levans, MD

## 2020-10-11 NOTE — Assessment & Plan Note (Addendum)
Strongly encouraged patient to stop smoking. He was prescribed nicotine patches but has not gotten them filled. Changed prescription to be sent to his home.      . Current smoking consumption amount: 1 pk every 3-4 days  . Dicsussion on advise to quit smoking and smoking impacts: cv lung impacts  . Patient's willingness to quit:  Not established  . Methods to quit smoking discussed:  Nicotine replacement  . Medication management of smoking session drugs discussed:  Patch nicotine  . Setting quit date  Not est  . Follow-up arranged 34mo   Time spent counseling the patient:  

## 2020-10-11 NOTE — Assessment & Plan Note (Signed)
Currently on Suboxone 16 mg but not attending NA. Encouraged patient to remain abstinent of all illegal substances.

## 2020-10-11 NOTE — Assessment & Plan Note (Signed)
Managed by ID- currently prescribed Vemlidy.

## 2020-10-12 ENCOUNTER — Encounter: Payer: Self-pay | Admitting: Physician Assistant

## 2020-10-12 ENCOUNTER — Other Ambulatory Visit: Payer: Self-pay

## 2020-10-25 ENCOUNTER — Encounter (HOSPITAL_COMMUNITY): Payer: Self-pay

## 2020-11-01 ENCOUNTER — Other Ambulatory Visit (HOSPITAL_COMMUNITY): Payer: Self-pay

## 2020-11-01 ENCOUNTER — Other Ambulatory Visit: Payer: Self-pay | Admitting: Physician Assistant

## 2020-11-02 ENCOUNTER — Inpatient Hospital Stay: Payer: MEDICAID | Attending: Physician Assistant | Admitting: Physician Assistant

## 2020-11-02 ENCOUNTER — Inpatient Hospital Stay: Payer: MEDICAID

## 2020-11-07 ENCOUNTER — Ambulatory Visit: Payer: Self-pay | Admitting: Critical Care Medicine

## 2020-11-07 NOTE — Progress Notes (Deleted)
Subjective:    Patient ID: David Short, male    DOB: [birth year], 50 y.o.   MRN: [id number]  History of Present Illness:   50 y.o.M hx COPD ref by David Short  10/11/2020   David Short is a new patient to consult COPD via phone visit. As of now he only has his albuterol inhaler which he is using 3-4 times per day and 1-2 times at night. He is running low on his home oxygen and is currently on 2 L but is supposed to be on 3. He has a home pulse oximeter that read 89-91% during he visit. He gets SOB easily with exertion and often has to stop, use his inhaler and rest with relief. See the shortness of breath form below for more details relating to HPI   He does not have any nicotine patches and reports smoking 1 pack of cigarettes per every 3 days. He is trying to cut back. He also states that he is "doing his best" to abstain from cocaine and heroin use. He is receiving treatment at the [facility] clinic and is on 16 mg per day. He has not attended any narcotics anonymous meetings as he is limited by his breathing, oxygen supplies and transportation.    He is currently being managed by infectious disease for chronic hepatitis B. He is currently prescribed Vemlidy 25mg .   11/07/2020  COPD, severe (HCC) Uncontrolled with persistent productive cough and yellow phlegm. Currently on 2 L oxygen and encouraged patient to be at 3 L. Albuterol and Symbicort inhalers were refilled in addition to prednisone dose pack and azithromycin to be mailed to his home. SP02 at home was 89-91%   Strongly encouraged tobacco cessation   Hepatitis B infection Managed by ID- currently prescribed Vemlidy.   CHF (congestive heart failure) (HCC) Furosemide was refilled. Patient will return to clinic for face to face visit in August  IV drug abuse [facility]) Currently on Suboxone 16 mg but not attending NA. Encouraged patient to remain abstinent of all illegal substances.   Diagnoses and all orders for this  visit:  Pulmonary emphysema, unspecified emphysema type (HCC) -     albuterol (VENTOLIN HFA) 108 (90 Base) MCG/ACT inhaler; Inhale 2 puffs into the lungs every 6 (six) hours as needed for wheezing or shortness of breath. -     budesonide-formoterol (SYMBICORT) 160-4.5 MCG/ACT inhaler; Inhale 2 puffs into the lungs in the morning and at bedtime.    Shortness of Breath This is a chronic problem. The current episode started more than 1 year ago. The problem occurs constantly. The problem has been rapidly worsening. Associated symptoms include rhinorrhea, a sore throat, sputum production and wheezing. Pertinent negatives include no chest pain, ear pain, fever, headaches, hemoptysis, leg swelling, syncope or vomiting. The symptoms are aggravated by any activity. Associated symptoms comments: Bad teeth Prod thick yellow mucus. He has tried beta agonist inhalers, rest and oral steroids for the symptoms. His past medical history is significant for COPD. There is no history of allergies or a heart failure.   Past Medical History:  Diagnosis Date   CHF (congestive heart failure) (HCC)    Chronic back pain    COPD (chronic obstructive pulmonary disease) (HCC)    IDA (iron deficiency anemia)    IVDU (intravenous drug user)    heroin    On home oxygen therapy    Osteomyelitis (HCC)    Splenomegaly    Thrombocytopenia (HCC)    Tobacco use  Family History  Problem Relation Age of Onset   Hypertension Sister    Hypertension Brother    Colon cancer Maternal Grandmother    Prostate cancer Maternal Grandfather    Colon cancer Maternal Grandfather      Social History   Socioeconomic History   Marital status: Single    Spouse name: Not on file   Number of children: Not on file   Years of education: Not on file   Highest education level: Not on file  Occupational History   Not on file  Tobacco Use   Smoking status: Every Day    Packs/day: 1.00    Types: Cigarettes   Smokeless tobacco:  Never  Vaping Use   Vaping Use: Never used  Substance and Sexual Activity   Alcohol use: Not Currently   Drug use: Not Currently    Types: IV, Heroin    Comment: IV heroin last use 07/21/20   Sexual activity: Not on file  Other Topics Concern   Not on file  Social History Narrative   Not on file   Social Determinants of Health   Financial Resource Strain: Not on file  Food Insecurity: Not on file  Transportation Needs: Not on file  Physical Activity: Not on file  Stress: Not on file  Social Connections: Not on file  Intimate Partner Violence: Not on file     Allergies  Allergen Reactions   Tylenol [Acetaminophen] Hives     Outpatient Medications Prior to Visit  Medication Sig Dispense Refill   albuterol (VENTOLIN HFA) 108 (90 Base) MCG/ACT inhaler Inhale 2 puffs into the lungs every 6 (six) hours as needed for wheezing or shortness of breath. 18 g 2   azithromycin (ZITHROMAX) 250 MG tablet Take two once as 1 dose, then one daily until gone 6 tablet 0   budesonide-formoterol (SYMBICORT) 160-4.5 MCG/ACT inhaler Inhale 2 puffs into the lungs in the morning and at bedtime. 10.2 g 2   Buprenorphine HCl-Naloxone HCl (SUBOXONE) 8-2 MG FILM Place 2 Film under the tongue daily.     carvedilol (COREG) 3.125 MG tablet Take 3.125 mg by mouth 2 (two) times daily with a meal.     ferrous sulfate 325 (65 FE) MG EC tablet Take 1 tablet (325 mg total) by mouth daily with breakfast. 30 tablet 3   furosemide (LASIX) 20 MG tablet Take 1 tablet (20 mg total) by mouth daily. 30 tablet 2   nicotine (NICODERM CQ - DOSED IN MG/24 HOURS) 21 mg/24hr patch Place 1 patch (21 mg total) onto the skin daily. 28 patch 1   pantoprazole (PROTONIX) 40 MG tablet Take 1 tablet (40 mg total) by mouth daily. 30 tablet 1   predniSONE (DELTASONE) 10 MG tablet Take 4 tablets daily for 5 days then stop 20 tablet 0   Tenofovir Alafenamide Fumarate (VEMLIDY) 25 MG TABS Take 1 tablet (25 mg total) by mouth daily. 30 tablet  5   No facility-administered medications prior to visit.     Review of Systems  Constitutional:  Negative for fever.  HENT:  Positive for congestion, dental problem, hearing loss, postnasal drip, rhinorrhea, sinus pressure, sinus pain, sneezing and sore throat. Negative for ear discharge and ear pain.   Eyes:  Positive for discharge and itching.  Respiratory:  Positive for cough, sputum production, chest tightness, shortness of breath and wheezing. Negative for hemoptysis.   Cardiovascular:  Negative for chest pain, leg swelling and syncope.  Gastrointestinal:  Negative for vomiting.  Genitourinary:  Negative.   Musculoskeletal: Negative.   Skin: Negative.   Neurological:  Negative for headaches.  Psychiatric/Behavioral: Negative.        Objective:   Physical Exam  No physical exam was performed as the visit was conducted over the phone      Assessment & Plan:  I personally reviewed all images and lab data in the [facility] system as well as any outside material available during this office visit and agree with the  radiology impressions.   No problem-specific Assessment & Plan notes found for this encounter.   There are no diagnoses linked to this encounter.

## 2020-11-08 ENCOUNTER — Encounter (HOSPITAL_COMMUNITY): Admission: RE | Admit: 2020-11-08 | Payer: Self-pay | Source: Ambulatory Visit

## 2020-11-08 ENCOUNTER — Other Ambulatory Visit (HOSPITAL_COMMUNITY): Payer: Self-pay

## 2020-11-26 NOTE — Progress Notes (Deleted)
Cardiology Office Note   Date:  11/26/2020   ID:  David Short, DOB February 23, 1971, MRN [id number]  PCP:  Marcine Matar, MD  Cardiologist:   None Referring:  Marcine Matar, MD  No chief complaint on file.     History of Present Illness: David Short is a 50 y.o. male who is refe    He is followed by Dr. Delford Field for severe COPD.  He is on 3 liters O2 at home.  ***    Past Medical History:  Diagnosis Date   CHF (congestive heart failure) (HCC)    Chronic back pain    COPD (chronic obstructive pulmonary disease) (HCC)    IDA (iron deficiency anemia)    IVDU (intravenous drug user)    heroin    On home oxygen therapy    Osteomyelitis (HCC)    Splenomegaly    Thrombocytopenia (HCC)    Tobacco use     Past Surgical History:  Procedure Laterality Date   ANTERIOR CERVICAL DECOMP/DISCECTOMY FUSION N/A 12/14/2018   Procedure: ANTERIOR CERVICAL DISCECTOMY FUSION CERVICAL FIVE- CERVICAL SIX;  Surgeon: Tia Alert, MD;  Location: [facility] OR;  Service: Neurosurgery;  Laterality: N/A;   BACK SURGERY     states never had surgery   FRACTURE SURGERY Left    elbow/wrist/ankle     Current Outpatient Medications  Medication Sig Dispense Refill   albuterol (VENTOLIN HFA) 108 (90 Base) MCG/ACT inhaler Inhale 2 puffs into the lungs every 6 (six) hours as needed for wheezing or shortness of breath. 18 g 2   azithromycin (ZITHROMAX) 250 MG tablet Take two once as 1 dose, then one daily until gone 6 tablet 0   budesonide-formoterol (SYMBICORT) 160-4.5 MCG/ACT inhaler Inhale 2 puffs into the lungs in the morning and at bedtime. 10.2 g 2   Buprenorphine HCl-Naloxone HCl (SUBOXONE) 8-2 MG FILM Place 2 Film under the tongue daily.     carvedilol (COREG) 3.125 MG tablet Take 3.125 mg by mouth 2 (two) times daily with a meal.     ferrous sulfate 325 (65 FE) MG EC tablet Take 1 tablet (325 mg total) by mouth daily with breakfast. 30 tablet 3   furosemide (LASIX) 20 MG tablet Take 1  tablet (20 mg total) by mouth daily. 30 tablet 2   nicotine (NICODERM CQ - DOSED IN MG/24 HOURS) 21 mg/24hr patch Place 1 patch (21 mg total) onto the skin daily. 28 patch 1   pantoprazole (PROTONIX) 40 MG tablet Take 1 tablet (40 mg total) by mouth daily. 30 tablet 1   predniSONE (DELTASONE) 10 MG tablet Take 4 tablets daily for 5 days then stop 20 tablet 0   Tenofovir Alafenamide Fumarate (VEMLIDY) 25 MG TABS Take 1 tablet (25 mg total) by mouth daily. 30 tablet 5   No current facility-administered medications for this visit.    Allergies:   Tylenol [acetaminophen]    Social History:  The patient  reports that he has been smoking cigarettes. He has been smoking an average of 1 pack per day. He has never used smokeless tobacco. He reports that he does not currently use alcohol. He reports that he does not currently use drugs after having used the following drugs: IV and Heroin.   Family History:  The patient's ***family history includes Colon cancer in his maternal grandfather and maternal grandmother; Hypertension in his brother and sister; Prostate cancer in his maternal grandfather.    ROS:  Please see the history of present  illness.   Otherwise, review of systems are positive for {NONE DEFAULTED:18576}.   All other systems are reviewed and negative.    PHYSICAL EXAM: VS:  There were no vitals taken for this visit. , BMI There is no height or weight on file to calculate BMI. GENERAL:  Well appearing HEENT:  Pupils equal round and reactive, fundi not visualized, oral mucosa unremarkable NECK:  No jugular venous distention, waveform within normal limits, carotid upstroke brisk and symmetric, no bruits, no thyromegaly LYMPHATICS:  No cervical, inguinal adenopathy LUNGS:  Clear to auscultation bilaterally BACK:  No CVA tenderness CHEST:  Unremarkable HEART:  PMI not displaced or sustained,S1 and S2 within normal limits, no S3, no S4, no clicks, no rubs, *** murmurs ABD:  Flat, positive  bowel sounds normal in frequency in pitch, no bruits, no rebound, no guarding, no midline pulsatile mass, no hepatomegaly, no splenomegaly EXT:  2 plus pulses throughout, no edema, no cyanosis no clubbing SKIN:  No rashes no nodules NEURO:  Cranial nerves II through XII grossly intact, motor grossly intact throughout PSYCH:  Cognitively intact, oriented to person place and time   ECHO: 1. Right ventricular systolic function is normal. The right ventricular  size is severely enlarged. There is moderately elevated pulmonary artery  systolic pressure. The estimated right ventricular systolic pressure is  52.5 mmHg. Right ventricular apex  not well visualized. Coronary sinus dilation consistent with right  ventricular overload.   2. Right atrial size was severely dilated.   3. A small pericardial effusion is present. The pericardial effusion is  circumferential. There is no evidence of increased pericardial pressure.   4. Left ventricular ejection fraction, by estimation, is 65 to 70%. The  left ventricle has normal function. The left ventricle has no regional  wall motion abnormalities. There is mild concentric left ventricular  hypertrophy. Left ventricular diastolic  parameters were normal. There is the interventricular septum is flattened  in systole, consistent with right ventricular pressure overload.   5. Tricuspid valve regurgitation is moderate and eccentric best seen in  A4c view .   6. Left atrial size was mildly dilated.   7. The mitral valve is normal in structure. Trivial mitral valve  regurgitation.   8. The aortic valve is tricuspid. Aortic valve regurgitation is not  visualized. No aortic stenosis is present.   9. The inferior vena cava is dilated in size with <50% respiratory  variability, suggesting right atrial pressure of 15 mmHg.    EKG:  EKG {ACTION; IS/IS CWC:[id number]} ordered today. The ekg ordered today demonstrates ***   Recent Labs: 06/17/2020: TSH  17.287 07/15/2020: B Natriuretic Peptide 362.5 07/17/2020: Magnesium 2.0 08/02/2020: Hemoglobin 12.1; Platelet Count 197 08/22/2020: ALT 134; ALT 137; BUN 12; Creat 0.64; Potassium 4.3; Sodium 141    Lipid Panel No results found for: CHOL, TRIG, HDL, CHOLHDL, VLDL, LDLCALC, LDLDIRECT    Wt Readings from Last 3 Encounters:  09/16/20 129 lb 9.6 oz (58.8 kg)  08/22/20 131 lb (59.4 kg)  08/02/20 125 lb 11.2 oz (57 kg)      Other studies Reviewed: Additional studies/ records that were reviewed today include: ***. Review of the above records demonstrates:  Please see elsewhere in the note.  ***   ASSESSMENT AND PLAN:  RIGHT HEART FAILURE:  ***   TOBACCO ABUSE:  ***    Current medicines are reviewed at length with the patient today.  The patient {ACTIONS; HAS/DOES NOT HAVE:19233} concerns regarding medicines.  The following changes have  been made:  {PLAN; NO CHANGE:13088:s}  Labs/ tests ordered today include: *** No orders of the defined types were placed in this encounter.    Disposition:   FU with ***    Signed, Rollene Rotunda, MD  11/26/2020 9:37 PM    [facility]

## 2020-11-28 ENCOUNTER — Ambulatory Visit: Payer: Self-pay | Admitting: Cardiology

## 2020-11-29 ENCOUNTER — Ambulatory Visit: Payer: Self-pay | Admitting: Internal Medicine

## 2020-12-01 ENCOUNTER — Other Ambulatory Visit (HOSPITAL_COMMUNITY): Payer: Self-pay

## 2020-12-02 ENCOUNTER — Other Ambulatory Visit (HOSPITAL_COMMUNITY): Payer: Self-pay

## 2020-12-02 ENCOUNTER — Encounter: Payer: Self-pay | Admitting: Physician Assistant

## 2020-12-29 ENCOUNTER — Other Ambulatory Visit (HOSPITAL_COMMUNITY): Payer: Self-pay

## 2020-12-29 ENCOUNTER — Encounter: Payer: Self-pay | Admitting: Physician Assistant

## 2020-12-30 ENCOUNTER — Encounter: Payer: Self-pay | Admitting: Physician Assistant

## 2021-01-02 ENCOUNTER — Other Ambulatory Visit (HOSPITAL_COMMUNITY): Payer: Self-pay

## 2021-01-09 ENCOUNTER — Other Ambulatory Visit (HOSPITAL_COMMUNITY): Payer: Self-pay

## 2021-01-09 ENCOUNTER — Encounter: Payer: Self-pay | Admitting: Physician Assistant

## 2021-01-09 ENCOUNTER — Other Ambulatory Visit: Payer: Self-pay

## 2021-01-26 ENCOUNTER — Other Ambulatory Visit (HOSPITAL_COMMUNITY): Payer: Self-pay

## 2021-01-26 ENCOUNTER — Encounter: Payer: Self-pay | Admitting: Physician Assistant

## 2021-01-30 ENCOUNTER — Other Ambulatory Visit (HOSPITAL_COMMUNITY): Payer: Self-pay

## 2021-01-30 ENCOUNTER — Inpatient Hospital Stay (HOSPITAL_COMMUNITY): Payer: Medicaid Other

## 2021-01-30 ENCOUNTER — Other Ambulatory Visit: Payer: Self-pay

## 2021-01-30 ENCOUNTER — Encounter (HOSPITAL_COMMUNITY): Payer: Self-pay

## 2021-01-30 ENCOUNTER — Emergency Department (HOSPITAL_COMMUNITY): Payer: Medicaid Other

## 2021-01-30 ENCOUNTER — Inpatient Hospital Stay (HOSPITAL_COMMUNITY)
Admission: EM | Admit: 2021-01-30 | Discharge: 2021-02-16 | DRG: 917 | Disposition: E | Payer: Medicaid Other | Attending: Internal Medicine | Admitting: Internal Medicine

## 2021-01-30 DIAGNOSIS — J1000 Influenza due to other identified influenza virus with unspecified type of pneumonia: Secondary | ICD-10-CM | POA: Diagnosis present

## 2021-01-30 DIAGNOSIS — J9622 Acute and chronic respiratory failure with hypercapnia: Secondary | ICD-10-CM | POA: Diagnosis present

## 2021-01-30 DIAGNOSIS — Z515 Encounter for palliative care: Secondary | ICD-10-CM

## 2021-01-30 DIAGNOSIS — Z981 Arthrodesis status: Secondary | ICD-10-CM

## 2021-01-30 DIAGNOSIS — E43 Unspecified severe protein-calorie malnutrition: Secondary | ICD-10-CM | POA: Insufficient documentation

## 2021-01-30 DIAGNOSIS — N390 Urinary tract infection, site not specified: Secondary | ICD-10-CM | POA: Diagnosis present

## 2021-01-30 DIAGNOSIS — I468 Cardiac arrest due to other underlying condition: Secondary | ICD-10-CM | POA: Diagnosis present

## 2021-01-30 DIAGNOSIS — J9602 Acute respiratory failure with hypercapnia: Secondary | ICD-10-CM | POA: Diagnosis present

## 2021-01-30 DIAGNOSIS — Z8674 Personal history of sudden cardiac arrest: Secondary | ICD-10-CM

## 2021-01-30 DIAGNOSIS — J9601 Acute respiratory failure with hypoxia: Secondary | ICD-10-CM

## 2021-01-30 DIAGNOSIS — I469 Cardiac arrest, cause unspecified: Secondary | ICD-10-CM

## 2021-01-30 DIAGNOSIS — G936 Cerebral edema: Secondary | ICD-10-CM | POA: Diagnosis present

## 2021-01-30 DIAGNOSIS — J111 Influenza due to unidentified influenza virus with other respiratory manifestations: Secondary | ICD-10-CM | POA: Diagnosis not present

## 2021-01-30 DIAGNOSIS — I110 Hypertensive heart disease with heart failure: Secondary | ICD-10-CM | POA: Diagnosis present

## 2021-01-30 DIAGNOSIS — F1410 Cocaine abuse, uncomplicated: Secondary | ICD-10-CM | POA: Diagnosis present

## 2021-01-30 DIAGNOSIS — Z452 Encounter for adjustment and management of vascular access device: Secondary | ICD-10-CM

## 2021-01-30 DIAGNOSIS — G931 Anoxic brain damage, not elsewhere classified: Secondary | ICD-10-CM | POA: Diagnosis present

## 2021-01-30 DIAGNOSIS — J440 Chronic obstructive pulmonary disease with acute lower respiratory infection: Secondary | ICD-10-CM | POA: Diagnosis present

## 2021-01-30 DIAGNOSIS — Z20822 Contact with and (suspected) exposure to covid-19: Secondary | ICD-10-CM | POA: Diagnosis present

## 2021-01-30 DIAGNOSIS — N170 Acute kidney failure with tubular necrosis: Secondary | ICD-10-CM | POA: Diagnosis present

## 2021-01-30 DIAGNOSIS — I509 Heart failure, unspecified: Secondary | ICD-10-CM | POA: Diagnosis present

## 2021-01-30 DIAGNOSIS — G253 Myoclonus: Secondary | ICD-10-CM | POA: Diagnosis present

## 2021-01-30 DIAGNOSIS — R680 Hypothermia, not associated with low environmental temperature: Secondary | ICD-10-CM | POA: Diagnosis present

## 2021-01-30 DIAGNOSIS — D509 Iron deficiency anemia, unspecified: Secondary | ICD-10-CM | POA: Diagnosis present

## 2021-01-30 DIAGNOSIS — Z91199 Patient's noncompliance with other medical treatment and regimen due to unspecified reason: Secondary | ICD-10-CM

## 2021-01-30 DIAGNOSIS — E874 Mixed disorder of acid-base balance: Secondary | ICD-10-CM | POA: Diagnosis present

## 2021-01-30 DIAGNOSIS — R570 Cardiogenic shock: Secondary | ICD-10-CM | POA: Diagnosis present

## 2021-01-30 DIAGNOSIS — Z7951 Long term (current) use of inhaled steroids: Secondary | ICD-10-CM

## 2021-01-30 DIAGNOSIS — E875 Hyperkalemia: Secondary | ICD-10-CM | POA: Diagnosis not present

## 2021-01-30 DIAGNOSIS — R64 Cachexia: Secondary | ICD-10-CM | POA: Diagnosis present

## 2021-01-30 DIAGNOSIS — T401X1A Poisoning by heroin, accidental (unintentional), initial encounter: Secondary | ICD-10-CM | POA: Diagnosis present

## 2021-01-30 DIAGNOSIS — D696 Thrombocytopenia, unspecified: Secondary | ICD-10-CM | POA: Diagnosis present

## 2021-01-30 DIAGNOSIS — Z79899 Other long term (current) drug therapy: Secondary | ICD-10-CM

## 2021-01-30 DIAGNOSIS — Z66 Do not resuscitate: Secondary | ICD-10-CM | POA: Diagnosis not present

## 2021-01-30 DIAGNOSIS — Z9981 Dependence on supplemental oxygen: Secondary | ICD-10-CM

## 2021-01-30 DIAGNOSIS — G9340 Encephalopathy, unspecified: Secondary | ICD-10-CM | POA: Diagnosis not present

## 2021-01-30 DIAGNOSIS — N179 Acute kidney failure, unspecified: Secondary | ICD-10-CM | POA: Diagnosis not present

## 2021-01-30 DIAGNOSIS — Z681 Body mass index (BMI) 19 or less, adult: Secondary | ICD-10-CM

## 2021-01-30 DIAGNOSIS — B1910 Unspecified viral hepatitis B without hepatic coma: Secondary | ICD-10-CM | POA: Diagnosis present

## 2021-01-30 DIAGNOSIS — Z886 Allergy status to analgesic agent status: Secondary | ICD-10-CM

## 2021-01-30 DIAGNOSIS — R34 Anuria and oliguria: Secondary | ICD-10-CM | POA: Diagnosis present

## 2021-01-30 DIAGNOSIS — R451 Restlessness and agitation: Secondary | ICD-10-CM | POA: Diagnosis present

## 2021-01-30 DIAGNOSIS — F1110 Opioid abuse, uncomplicated: Secondary | ICD-10-CM | POA: Diagnosis present

## 2021-01-30 DIAGNOSIS — Z8249 Family history of ischemic heart disease and other diseases of the circulatory system: Secondary | ICD-10-CM

## 2021-01-30 DIAGNOSIS — F17210 Nicotine dependence, cigarettes, uncomplicated: Secondary | ICD-10-CM | POA: Diagnosis present

## 2021-01-30 DIAGNOSIS — Z4659 Encounter for fitting and adjustment of other gastrointestinal appliance and device: Secondary | ICD-10-CM

## 2021-01-30 LAB — CBC
HCT: 45.1 % (ref 39.0–52.0)
Hemoglobin: 12 g/dL — ABNORMAL LOW (ref 13.0–17.0)
MCH: 22.8 pg — ABNORMAL LOW (ref 26.0–34.0)
MCHC: 26.6 g/dL — ABNORMAL LOW (ref 30.0–36.0)
MCV: 85.7 fL (ref 80.0–100.0)
Platelets: 66 10*3/uL — ABNORMAL LOW (ref 150–400)
RBC: 5.26 MIL/uL (ref 4.22–5.81)
RDW: 19.5 % — ABNORMAL HIGH (ref 11.5–15.5)
WBC: 7.5 10*3/uL (ref 4.0–10.5)
nRBC: 0 % (ref 0.0–0.2)

## 2021-01-30 LAB — CBC WITH DIFFERENTIAL/PLATELET
Abs Immature Granulocytes: 0.9 10*3/uL — ABNORMAL HIGH (ref 0.00–0.07)
Abs Immature Granulocytes: 0.29 10*3/uL — ABNORMAL HIGH (ref 0.00–0.07)
Basophils Absolute: 0 10*3/uL (ref 0.0–0.1)
Basophils Absolute: 0 10*3/uL (ref 0.0–0.1)
Basophils Relative: 0 %
Basophils Relative: 0 %
Eosinophils Absolute: 0.1 10*3/uL (ref 0.0–0.5)
Eosinophils Absolute: 0 10*3/uL (ref 0.0–0.5)
Eosinophils Relative: 1 %
Eosinophils Relative: 0 %
HCT: 53.1 % — ABNORMAL HIGH (ref 39.0–52.0)
HCT: 47.1 % (ref 39.0–52.0)
Hemoglobin: 13.5 g/dL (ref 13.0–17.0)
Hemoglobin: 13.3 g/dL (ref 13.0–17.0)
Immature Granulocytes: 3 %
Lymphocytes Relative: 9 %
Lymphocytes Relative: 5 %
Lymphs Abs: 1.2 10*3/uL (ref 0.7–4.0)
Lymphs Abs: 0.5 10*3/uL — ABNORMAL LOW (ref 0.7–4.0)
MCH: 23.4 pg — ABNORMAL LOW (ref 26.0–34.0)
MCH: 23 pg — ABNORMAL LOW (ref 26.0–34.0)
MCHC: 25.4 g/dL — ABNORMAL LOW (ref 30.0–36.0)
MCHC: 28.2 g/dL — ABNORMAL LOW (ref 30.0–36.0)
MCV: 91.9 fL (ref 80.0–100.0)
MCV: 81.3 fL (ref 80.0–100.0)
Metamyelocytes Relative: 3 %
Monocytes Absolute: 0.4 10*3/uL (ref 0.1–1.0)
Monocytes Absolute: 0.8 10*3/uL (ref 0.1–1.0)
Monocytes Relative: 3 %
Monocytes Relative: 8 %
Myelocytes: 4 %
Neutro Abs: 10.7 10*3/uL — ABNORMAL HIGH (ref 1.7–7.7)
Neutro Abs: 9.4 10*3/uL — ABNORMAL HIGH (ref 1.7–7.7)
Neutrophils Relative %: 80 %
Neutrophils Relative %: 84 %
Platelets: UNDETERMINED 10*3/uL (ref 150–400)
Platelets: 94 10*3/uL — ABNORMAL LOW (ref 150–400)
RBC: 5.78 MIL/uL (ref 4.22–5.81)
RBC: 5.79 MIL/uL (ref 4.22–5.81)
RDW: 19.9 % — ABNORMAL HIGH (ref 11.5–15.5)
RDW: 20.4 % — ABNORMAL HIGH (ref 11.5–15.5)
Smear Review: UNDETERMINED
WBC: 13.4 10*3/uL — ABNORMAL HIGH (ref 4.0–10.5)
WBC: 11.1 10*3/uL — ABNORMAL HIGH (ref 4.0–10.5)
nRBC: 0.5 % — ABNORMAL HIGH (ref 0.0–0.2)
nRBC: 0 % (ref 0.0–0.2)

## 2021-01-30 LAB — COMPREHENSIVE METABOLIC PANEL
ALT: 86 U/L — ABNORMAL HIGH (ref 0–44)
AST: 196 U/L — ABNORMAL HIGH (ref 15–41)
Albumin: 3 g/dL — ABNORMAL LOW (ref 3.5–5.0)
Alkaline Phosphatase: 108 U/L (ref 38–126)
Anion gap: 15 (ref 5–15)
BUN: 80 mg/dL — ABNORMAL HIGH (ref 6–20)
CO2: 20 mmol/L — ABNORMAL LOW (ref 22–32)
Calcium: 8.7 mg/dL — ABNORMAL LOW (ref 8.9–10.3)
Chloride: 106 mmol/L (ref 98–111)
Creatinine, Ser: 4.67 mg/dL — ABNORMAL HIGH (ref 0.61–1.24)
GFR, Estimated: 14 mL/min — ABNORMAL LOW (ref >60)
Glucose, Bld: 52 mg/dL — ABNORMAL LOW (ref 70–99)
Potassium: 7.2 mmol/L (ref 3.5–5.1)
Sodium: 141 mmol/L (ref 135–145)
Total Bilirubin: 2 mg/dL — ABNORMAL HIGH (ref 0.3–1.2)
Total Protein: 8.4 g/dL — ABNORMAL HIGH (ref 6.5–8.1)

## 2021-01-30 LAB — RAPID URINE DRUG SCREEN, HOSP PERFORMED
Amphetamines: NOT DETECTED
Barbiturates: NOT DETECTED
Benzodiazepines: NOT DETECTED
Cocaine: POSITIVE — AB
Opiates: NOT DETECTED
Tetrahydrocannabinol: NOT DETECTED

## 2021-01-30 LAB — I-STAT ARTERIAL BLOOD GAS, ED
Acid-base deficit: 11 mmol/L — ABNORMAL HIGH (ref 0.0–2.0)
Acid-base deficit: 3 mmol/L — ABNORMAL HIGH (ref 0.0–2.0)
Bicarbonate: 20.5 mmol/L (ref 20.0–28.0)
Bicarbonate: 24.9 mmol/L (ref 20.0–28.0)
Calcium, Ion: 1.07 mmol/L — ABNORMAL LOW (ref 1.15–1.40)
Calcium, Ion: 1.06 mmol/L — ABNORMAL LOW (ref 1.15–1.40)
HCT: 46 % (ref 39.0–52.0)
HCT: 44 % (ref 39.0–52.0)
Hemoglobin: 15.6 g/dL (ref 13.0–17.0)
Hemoglobin: 15 g/dL (ref 13.0–17.0)
O2 Saturation: 100 %
O2 Saturation: 94 %
Patient temperature: 88.7
Patient temperature: 92.7
Potassium: 7.9 mmol/L (ref 3.5–5.1)
Potassium: 5.7 mmol/L — ABNORMAL HIGH (ref 3.5–5.1)
Sodium: 138 mmol/L (ref 135–145)
Sodium: 142 mmol/L (ref 135–145)
TCO2: 23 mmol/L (ref 22–32)
TCO2: 27 mmol/L (ref 22–32)
pCO2 arterial: 54.5 mmHg — ABNORMAL HIGH (ref 32.0–48.0)
pCO2 arterial: 45.6 mmHg (ref 32.0–48.0)
pH, Arterial: 7.148 — CL (ref 7.350–7.450)
pH, Arterial: 7.329 — ABNORMAL LOW (ref 7.350–7.450)
pO2, Arterial: 560 mmHg — ABNORMAL HIGH (ref 83.0–108.0)
pO2, Arterial: 64 mmHg — ABNORMAL LOW (ref 83.0–108.0)

## 2021-01-30 LAB — BASIC METABOLIC PANEL
Anion gap: 11 (ref 5–15)
BUN: 91 mg/dL — ABNORMAL HIGH (ref 6–20)
CO2: 27 mmol/L (ref 22–32)
Calcium: 7.7 mg/dL — ABNORMAL LOW (ref 8.9–10.3)
Chloride: 105 mmol/L (ref 98–111)
Creatinine, Ser: 4.21 mg/dL — ABNORMAL HIGH (ref 0.61–1.24)
GFR, Estimated: 16 mL/min — ABNORMAL LOW (ref >60)
Glucose, Bld: 146 mg/dL — ABNORMAL HIGH (ref 70–99)
Potassium: 4.7 mmol/L (ref 3.5–5.1)
Sodium: 143 mmol/L (ref 135–145)

## 2021-01-30 LAB — URINALYSIS, MICROSCOPIC (REFLEX): WBC, UA: >50 WBC/hpf (ref 0–5)

## 2021-01-30 LAB — RESP PANEL BY RT-PCR (FLU A&B, COVID) ARPGX2
Influenza A by PCR: POSITIVE — AB
Influenza B by PCR: NEGATIVE
SARS Coronavirus 2 by RT PCR: NEGATIVE

## 2021-01-30 LAB — CBG MONITORING, ED: Glucose-Capillary: 197 mg/dL — ABNORMAL HIGH (ref 70–99)

## 2021-01-30 LAB — LIPASE, BLOOD: Lipase: 64 U/L — ABNORMAL HIGH (ref 11–51)

## 2021-01-30 LAB — URINALYSIS, ROUTINE W REFLEX MICROSCOPIC
Bilirubin Urine: NEGATIVE
Glucose, UA: NEGATIVE mg/dL
Ketones, ur: NEGATIVE mg/dL
Nitrite: POSITIVE — AB
Protein, ur: 100 mg/dL — AB
Specific Gravity, Urine: >1.03 — ABNORMAL HIGH (ref 1.005–1.030)
pH: 6 (ref 5.0–8.0)

## 2021-01-30 LAB — ECHOCARDIOGRAM COMPLETE
AR max vel: 2.29 cm2
AV Peak grad: 4.2 mmHg
Ao pk vel: 1.02 m/s
Area-P 1/2: 3.53 cm2
Calc EF: 57.4 %
Height: 69 in
S' Lateral: 2.6 cm
Single Plane A2C EF: 56.8 %
Single Plane A4C EF: 58 %

## 2021-01-30 LAB — MRSA NEXT GEN BY PCR, NASAL: MRSA by PCR Next Gen: DETECTED — AB

## 2021-01-30 LAB — BRAIN NATRIURETIC PEPTIDE: B Natriuretic Peptide: 1433.6 pg/mL — ABNORMAL HIGH (ref 0.0–100.0)

## 2021-01-30 LAB — MAGNESIUM: Magnesium: 3.3 mg/dL — ABNORMAL HIGH (ref 1.7–2.4)

## 2021-01-30 LAB — LACTIC ACID, PLASMA
Lactic Acid, Venous: >9 mmol/L (ref 0.5–1.9)
Lactic Acid, Venous: 4.9 mmol/L (ref 0.5–1.9)

## 2021-01-30 LAB — GLUCOSE, CAPILLARY
Glucose-Capillary: 184 mg/dL — ABNORMAL HIGH (ref 70–99)
Glucose-Capillary: 94 mg/dL (ref 70–99)

## 2021-01-30 LAB — ETHANOL: Alcohol, Ethyl (B): <10 mg/dL (ref <10)

## 2021-01-30 MED ORDER — DEXTROSE 50 % IV SOLN
1.0000 | Freq: Once | INTRAVENOUS | Status: AC
  Administered 2021-01-30: 50 mL via INTRAVENOUS
  Filled 2021-01-30: qty 50

## 2021-01-30 MED ORDER — POLYETHYLENE GLYCOL 3350 17 G PO PACK
17.0000 g | PACK | Freq: Every day | ORAL | Status: DC
  Administered 2021-01-31: 17 g
  Filled 2021-01-30 (×2): qty 1

## 2021-01-30 MED ORDER — ALBUTEROL SULFATE (2.5 MG/3ML) 0.083% IN NEBU
10.0000 mg | INHALATION_SOLUTION | Freq: Once | RESPIRATORY_TRACT | Status: AC
  Administered 2021-01-30: 10 mg via RESPIRATORY_TRACT
  Filled 2021-01-30: qty 12

## 2021-01-30 MED ORDER — HEPARIN SODIUM (PORCINE) 5000 UNIT/ML IJ SOLN
5000.0000 [IU] | Freq: Three times a day (TID) | INTRAMUSCULAR | Status: DC
  Administered 2021-01-30 – 2021-02-03 (×12): 5000 [IU] via SUBCUTANEOUS
  Filled 2021-01-30 (×13): qty 1

## 2021-01-30 MED ORDER — SODIUM CHLORIDE 0.9 % IV SOLN
2.0000 g | INTRAVENOUS | Status: AC
  Administered 2021-01-30 – 2021-02-03 (×5): 2 g via INTRAVENOUS
  Filled 2021-01-30 (×5): qty 20

## 2021-01-30 MED ORDER — FENTANYL CITRATE PF 50 MCG/ML IJ SOSY: 100.0000 ug | PREFILLED_SYRINGE | INTRAMUSCULAR | Status: DC | PRN

## 2021-01-30 MED ORDER — ACETAMINOPHEN 650 MG RE SUPP: 650.0000 mg | RECTAL | Status: DC

## 2021-01-30 MED ORDER — SODIUM BICARBONATE 8.4 % IV SOLN
100.0000 meq | Freq: Once | INTRAVENOUS | Status: AC
Start: 2021-01-30 — End: 2021-01-30
  Administered 2021-01-30: 100 meq via INTRAVENOUS
  Filled 2021-01-30: qty 50

## 2021-01-30 MED ORDER — OSELTAMIVIR PHOSPHATE 30 MG PO CAPS
30.0000 mg | ORAL_CAPSULE | Freq: Every day | ORAL | Status: DC
  Administered 2021-01-30: 30 mg via ORAL
  Filled 2021-01-30 (×2): qty 1

## 2021-01-30 MED ORDER — BUSPIRONE HCL 15 MG PO TABS
30.0000 mg | ORAL_TABLET | Freq: Three times a day (TID) | ORAL | Status: DC
  Filled 2021-01-30: qty 2

## 2021-01-30 MED ORDER — SODIUM BICARBONATE 8.4 % IV SOLN
Freq: Once | INTRAVENOUS | Status: AC
  Filled 2021-01-30: qty 1000

## 2021-01-30 MED ORDER — BUSPIRONE HCL 15 MG PO TABS
30.0000 mg | ORAL_TABLET | Freq: Three times a day (TID) | ORAL | Status: DC
  Administered 2021-01-30 – 2021-02-01 (×5): 30 mg
  Filled 2021-01-30 (×4): qty 2

## 2021-01-30 MED ORDER — ETOMIDATE 2 MG/ML IV SOLN
INTRAVENOUS | Status: DC | PRN
  Administered 2021-01-30: 20 mg via INTRAVENOUS

## 2021-01-30 MED ORDER — MUPIROCIN 2 % EX OINT
1.0000 "application " | TOPICAL_OINTMENT | Freq: Two times a day (BID) | CUTANEOUS | Status: DC
  Administered 2021-01-31 – 2021-02-03 (×8): 1 via NASAL
  Filled 2021-01-30 (×2): qty 22

## 2021-01-30 MED ORDER — CALCIUM GLUCONATE 10 % IV SOLN
1.0000 g | Freq: Once | INTRAVENOUS | Status: AC
  Administered 2021-01-30: 1 g via INTRAVENOUS
  Filled 2021-01-30: qty 10

## 2021-01-30 MED ORDER — ORAL CARE MOUTH RINSE
15.0000 mL | OROMUCOSAL | Status: DC
  Administered 2021-01-30 – 2021-02-03 (×39): 15 mL via OROMUCOSAL

## 2021-01-30 MED ORDER — NOREPINEPHRINE 4 MG/250ML-% IV SOLN
0.0000 ug/min | INTRAVENOUS | Status: DC
Start: 2021-01-30 — End: 2021-01-30
  Administered 2021-01-30: 5 ug/min via INTRAVENOUS

## 2021-01-30 MED ORDER — MIDAZOLAM HCL 2 MG/2ML IJ SOLN
2.0000 mg | INTRAMUSCULAR | Status: DC | PRN
  Administered 2021-01-30 (×2): 2 mg via INTRAVENOUS
  Filled 2021-01-30 (×2): qty 2

## 2021-01-30 MED ORDER — ACETAMINOPHEN 325 MG PO TABS: 650.0000 mg | ORAL_TABLET | ORAL | Status: DC

## 2021-01-30 MED ORDER — CHLORHEXIDINE GLUCONATE 0.12% ORAL RINSE (MEDLINE KIT)
15.0000 mL | Freq: Two times a day (BID) | OROMUCOSAL | Status: DC
  Administered 2021-01-30 – 2021-02-03 (×8): 15 mL via OROMUCOSAL

## 2021-01-30 MED ORDER — PROPOFOL 1000 MG/100ML IV EMUL
5.0000 ug/kg/min | INTRAVENOUS | Status: DC
  Administered 2021-01-30: 5 ug/kg/min via INTRAVENOUS
  Administered 2021-01-31: 40 ug/kg/min via INTRAVENOUS
  Administered 2021-01-31: 45 ug/kg/min via INTRAVENOUS
  Filled 2021-01-30 (×3): qty 100

## 2021-01-30 MED ORDER — ACETAMINOPHEN 160 MG/5ML PO SOLN: 650.0000 mg | ORAL | Status: DC

## 2021-01-30 MED ORDER — CHLORHEXIDINE GLUCONATE CLOTH 2 % EX PADS
6.0000 | MEDICATED_PAD | Freq: Every day | CUTANEOUS | Status: DC
  Administered 2021-01-31 – 2021-02-01 (×2): 6 via TOPICAL

## 2021-01-30 MED ORDER — NOREPINEPHRINE 4 MG/250ML-% IV SOLN
2.0000 ug/min | INTRAVENOUS | Status: DC
Start: 2021-01-30 — End: 2021-02-01
  Administered 2021-01-30: 5 ug/min via INTRAVENOUS

## 2021-01-30 MED ORDER — FUROSEMIDE 10 MG/ML IJ SOLN
40.0000 mg | Freq: Once | INTRAMUSCULAR | Status: DC
  Filled 2021-01-30: qty 4

## 2021-01-30 MED ORDER — MAGNESIUM SULFATE 2 GM/50ML IV SOLN
2.0000 g | Freq: Once | INTRAVENOUS | Status: AC
  Administered 2021-01-30: 2 g via INTRAVENOUS
  Filled 2021-01-30: qty 50

## 2021-01-30 MED ORDER — ONDANSETRON HCL 4 MG/2ML IJ SOLN: 4.0000 mg | Freq: Four times a day (QID) | INTRAMUSCULAR | Status: DC | PRN

## 2021-01-30 MED ORDER — ROCURONIUM BROMIDE 50 MG/5ML IV SOLN
INTRAVENOUS | Status: DC | PRN
  Administered 2021-01-30: 100 mg via INTRAVENOUS

## 2021-01-30 MED ORDER — DOCUSATE SODIUM 50 MG/5ML PO LIQD
100.0000 mg | Freq: Two times a day (BID) | ORAL | Status: DC
  Administered 2021-01-30 – 2021-01-31 (×3): 100 mg
  Filled 2021-01-30 (×4): qty 10

## 2021-01-30 MED ORDER — PANTOPRAZOLE SODIUM 40 MG IV SOLR
40.0000 mg | Freq: Every day | INTRAVENOUS | Status: DC
  Administered 2021-01-30: 40 mg via INTRAVENOUS
  Filled 2021-01-30: qty 40

## 2021-01-30 MED ORDER — SODIUM CHLORIDE 0.9 % IV SOLN
250.0000 mL | INTRAVENOUS | Status: DC
  Administered 2021-01-30 – 2021-02-03 (×4): 250 mL via INTRAVENOUS

## 2021-01-30 MED ORDER — MIDAZOLAM HCL 2 MG/2ML IJ SOLN: 2.0000 mg | INTRAMUSCULAR | Status: DC | PRN

## 2021-01-30 MED ORDER — SODIUM CHLORIDE 0.9 % IV BOLUS
500.0000 mL | Freq: Once | INTRAVENOUS | Status: AC
  Administered 2021-01-30: 500 mL via INTRAVENOUS

## 2021-01-30 MED ORDER — INSULIN ASPART 100 UNIT/ML IV SOLN
10.0000 [IU] | Freq: Once | INTRAVENOUS | Status: AC
  Administered 2021-01-30: 10 [IU] via INTRAVENOUS

## 2021-01-30 NOTE — Progress Notes (Signed)
LTM setup at bedside. MRI leads used. No skin breakdown noted. Results pending.

## 2021-01-30 NOTE — Progress Notes (Signed)
Patient transported to 3M12 from the ED without any apparent complications.

## 2021-01-30 NOTE — Progress Notes (Signed)
EEG done at bedside. MRI leads used. No skin breakdown noted. Results pending.

## 2021-01-30 NOTE — Consult Note (Addendum)
NEURO HOSPITALIST CONSULT NOTE   Requestig physician: Dr. Tacy Learn  Reason for Consult: Probable anoxic brain injury  History obtained from:  Chart     HPI:                                                                                                                                          David Short is an 50 y.o. male with a PMHx of CHF, COPD, IV heroin use, cocaine use, tobacco use, hepatitis B (on Vemlidy), osteomyelitis, splenomegaly and thromobocytopenia who presented to the hospital on Monday after an unwitnessed cardiac arrest in association with suspected heroin overdose. He was in PEA arrest on EMS arrival; CPR initiated and Epi x 2 given with ROSC. There was significant concern for hypoxic brian injury given prolonged downtime. He has a history of prior cardiac arrest requiring CPR earlier this year.   Additional history from CCM note was reviewed: "Brother Public relations account executive) at bedside provides available history. Patient and a friend were reportedly sitting on the porch around 0900 this morning (brother thinks using drugs) when patient began to act "out of it" and retired to his room. He was gone for at least an hour before someone went to check on him and noticed that he was cool to the touch and did not have a pulse. EMS was called and CPR was initiated  (as above). Per patient's brother, he uses several different kinds of drugs (has for many years) and he is not sure what his brother may have taken this morning. He used to be on Suboxone, but stopped going to clinic for this. Brother also notes that he has poorly controlled COPD and has HOT, but does not use it as he should. He has been admitted for cardiac arrest/OD before, most recently being April/May of this year. On ED arrival, patient was unresponsive with [facility] airway in place copious blood from his nasopharynx. He was subsequently intubated and Levophed was initiated. Hypothermic, Bair hugger placed. ABG  7.148/54.5/560/20.5. Labs notable for WBC 13.4, hyperkalemia (7.9), AKI with Cr 4.67 (baseline 0.5-1), mild transaminitis, lipase 64, lactic acidosis (LA > 9), BNP >1400, . UDS +cocaine. UA dirty with +nitrite. Respiratory virus panel +Flu A. EKG demonstrated no significant changes from prior. CXR with evidence of COPD, no acute process. CT Head showed diffuse cerebral edema c/f hypoxic brain injury."  Past Medical History:  Diagnosis Date   CHF (congestive heart failure) (HCC)    Chronic back pain    COPD (chronic obstructive pulmonary disease) (HCC)    IDA (iron deficiency anemia)    IVDU (intravenous drug user)    heroin    On home oxygen therapy    Osteomyelitis ([place])    Splenomegaly    Thrombocytopenia (HCC)    Tobacco use  Past Surgical History:  Procedure Laterality Date   ANTERIOR CERVICAL DECOMP/DISCECTOMY FUSION N/A 12/14/2018   Procedure: ANTERIOR CERVICAL DISCECTOMY FUSION CERVICAL FIVE- CERVICAL SIX;  Surgeon: Eustace Moore, MD;  Location: [facility];  Service: Neurosurgery;  Laterality: N/A;   BACK SURGERY     states never had surgery   FRACTURE SURGERY Left    elbow/wrist/ankle    Family History  Problem Relation Age of Onset   Hypertension Sister    Hypertension Brother    Colon cancer Maternal Grandmother    Prostate cancer Maternal Grandfather    Colon cancer Maternal Grandfather               Social History:  reports that he has been smoking cigarettes. He has been smoking an average of 1 pack per day. He has never used smokeless tobacco. He reports that he does not currently use alcohol. He reports that he does not currently use drugs after having used the following drugs: IV and Heroin.  Allergies  Allergen Reactions   Tylenol [Acetaminophen] Hives    MEDICATIONS:                                                                                                                     Prior to Admission:  Medications Prior to Admission  Medication Sig  Dispense Refill Last Dose   albuterol (VENTOLIN HFA) 108 (90 Base) MCG/ACT inhaler Inhale 2 puffs into the lungs every 6 (six) hours as needed for wheezing or shortness of breath. (Patient taking differently: Inhale 2 puffs into the lungs every 4 (four) hours as needed for wheezing or shortness of breath.) 18 g 2 unk   ALEVE 220 MG tablet Take 220-440 mg by mouth 2 (two) times daily as needed (for pain).   unk   budesonide-formoterol (SYMBICORT) 160-4.5 MCG/ACT inhaler Inhale 2 puffs into the lungs in the morning and at bedtime. 10.2 g 2 unk   carvedilol (COREG) 3.125 MG tablet Take 3.125 mg by mouth 2 (two) times daily with a meal.   unk   ferrous sulfate 325 (65 FE) MG EC tablet Take 1 tablet (325 mg total) by mouth daily with breakfast. 30 tablet 3 unk   furosemide (LASIX) 20 MG tablet Take 1 tablet (20 mg total) by mouth daily. 30 tablet 2 unk   nicotine (NICODERM CQ - DOSED IN MG/24 HOURS) 21 mg/24hr patch Place 1 patch (21 mg total) onto the skin daily. 28 patch 1 unk   OXYGEN Inhale 2-3 L/min into the lungs as needed (for shortness of breath).   unk   pantoprazole (PROTONIX) 40 MG tablet Take 1 tablet (40 mg total) by mouth daily. 30 tablet 1 unk   Tenofovir Alafenamide Fumarate (VEMLIDY) 25 MG TABS Take 1 tablet (25 mg total) by mouth daily. 30 tablet 5 unk   azithromycin (ZITHROMAX) 250 MG tablet Take two once as 1 dose, then one daily until gone (Patient not taking: No sig reported) 6 tablet 0 Not Taking  Buprenorphine HCl-Naloxone HCl 8-2 MG FILM Place 2 Film under the tongue daily. (Patient not taking: No sig reported)   Not Taking   predniSONE (DELTASONE) 10 MG tablet Take 4 tablets daily for 5 days then stop (Patient not taking: No sig reported) 20 tablet 0 Not Taking   Scheduled:  busPIRone  30 mg Oral Q8H   Or   busPIRone  30 mg Per Tube Q8H   chlorhexidine gluconate (MEDLINE KIT)  15 mL Mouth Rinse BID   Chlorhexidine Gluconate Cloth  6 each Topical Q0600   docusate  100 mg Per  Tube BID   heparin  5,000 Units Subcutaneous Q8H   mouth rinse  15 mL Mouth Rinse 10 times per day   mupirocin ointment  1 application Nasal BID   oseltamivir  30 mg Oral QHS   pantoprazole (PROTONIX) IV  40 mg Intravenous QHS   polyethylene glycol  17 g Per Tube Daily   Continuous:  sodium chloride 250 mL (02/09/2021 1405)   cefTRIAXone (ROCEPHIN)  IV Stopped (01/22/2021 1537)   norepinephrine (LEVOPHED) Adult infusion Stopped (02/09/2021 1618)   propofol (DIPRIVAN) infusion 45 mcg/kg/min (01/31/21 0628)     ROS:                                                                                                                                       Unable to obtain due to unresponsive state.    Blood pressure (!) 157/121, pulse (!) 102, temperature (!) 96.1 F (35.6 C), resp. rate (!) 43, height 5' 9" (1.753 m), weight 58.8 kg, SpO2 96 %.   General Examination:                                                                                                       Physical Exam  HEENT-  [place]/AT. Neck supple.   Lungs- Intubated Extremities- No edema. Onychomychosis noted. Decreased muscle bulk x 4.    Neurological Examination Mental Status: Sedated on propofol at time of exam. No responses to any external stimuli. No eye opening. No attempts to communicate.  Cranial Nerves: II: Pupils equal and sluggishly reactive 2 mm. No blink to threat.   III,IV, VI: No doll's eye reflex. Eyes are conjugate at the midline.  V, VII: No corneal reflexes VIII: No response to voice IX,X: Intubated XI: Head is midline. Intermittent low amplitude rightward twitching of head/neck noted.  XII: Intubated Motor/Sensory: Flaccid tone x 4 (on propofol) Decreased muscle bulk x 4.  Slight movement of lower extremities to sternal rub. Otherwise no other movement to noxious stimuli x 4.  Deep Tendon Reflexes: No reflexes elicitable (on propofol) Plantars: Mute bilaterally  Cerebellar/Gait: Unable to assess    Lab Results: Basic Metabolic Panel: Recent Labs  Lab 02/15/2021 1100 02/07/2021 1213 02/15/2021 1517  NA 141 138 142  K 7.2* 7.9* 5.7*  CL 106  --   --   CO2 20*  --   --   GLUCOSE 52*  --   --   BUN 80*  --   --   CREATININE 4.67*  --   --   CALCIUM 8.7*  --   --     CBC: Recent Labs  Lab 02/02/2021 1100 01/19/2021 1213 02/06/2021 1355 02/05/2021 1517  WBC 13.4*  --  7.5  --   NEUTROABS 10.7*  --   --   --   HGB 13.5 15.6 12.0* 15.0  HCT 53.1* 46.0 45.1 44.0  MCV 91.9  --  85.7  --   PLT PLATELET CLUMPS NOTED ON SMEAR, UNABLE TO ESTIMATE  --  66*  --     Cardiac Enzymes: No results for input(s): CKTOTAL, CKMB, CKMBINDEX, TROPONINI in the last 168 hours.  Lipid Panel: No results for input(s): CHOL, TRIG, HDL, CHOLHDL, VLDL, LDLCALC in the last 168 hours.  Imaging: CT Head Wo Contrast  Result Date: 02/08/2021 CLINICAL DATA:  Anoxic brain damage, post arrest EXAM: CT HEAD WITHOUT CONTRAST TECHNIQUE: Contiguous axial images were obtained from the base of the skull through the vertex without intravenous contrast. COMPARISON:  06/17/2020 FINDINGS: Brain: Slight narrowing of the sulci and sylvian fissures, which could indicate edema. Possible increased relative density of the cerebellum. No hypodensity to suggest acute infarct. No mass, mass effect, or midline shift. No acute hemorrhage. No extra-axial collection or hydrocephalus. Vascular: No hyperdense vessel. Skull: No acute osseous abnormality. Sinuses/Orbits: Partial opacification of the ethmoid air cells, with layering fluid in the maxillary and sphenoid sinuses. Other: The mastoids are well aerated. IMPRESSION: Findings suggestive of cerebral edema, with slight narrowing of the sulci and sylvian fissures and possible increased relative density of the cerebellum, as can be seen with hypoxic ischemic injury. An MRI is recommended for further evaluation. Electronically Signed   By: Merilyn Baba M.D.   On: 02/15/2021 12:04   DG Chest  Port 1 View  Result Date: 01/26/2021 CLINICAL DATA:  Altered level of consciousness. EXAM: PORTABLE CHEST 1 VIEW COMPARISON:  Radiographs 07/15/2020 and 06/24/2020.  CT 06/17/2020. FINDINGS: 1118 hours. Two views are submitted. Tip of the endotracheal tube is in the mid trachea. An enteric tube projects below the diaphragm, tip not visualized. The heart size and mediastinal contours are stable. The lungs are hyperinflated without evidence of superimposed airspace disease, edema, pleural effusion or pneumothorax. No acute osseous findings are evident. Multiple telemetry leads overlie the chest. Patient is status post lower cervical fusion. IMPRESSION: 1. Satisfactory position of the endotracheal and enteric tubes. 2. Chronic obstructive pulmonary disease without evidence of acute cardiopulmonary process. Electronically Signed   By: Richardean Sale M.D.   On: 01/25/2021 11:28   ECHOCARDIOGRAM COMPLETE  Result Date: 01/22/2021    ECHOCARDIOGRAM REPORT   Patient Name:   David Short Date of Exam: 01/18/2021 Medical Rec #:  [id number]       Height:       69.0 in Accession #:    [id number]      Weight:       129.6  lb Date of Birth:  1970/09/30       BSA:          1.718 m Patient Age:    32 years        BP:           111/87 mmHg Patient Gender: M               HR:           71 bpm. Exam Location:  Inpatient Procedure: 2D Echo, Cardiac Doppler and Color Doppler Indications:    Cardiac Arrest  History:        Patient has prior history of Echocardiogram examinations. CHF,                 Pulmonary HTN and COPD; Risk Factors:Hypertension.  Sonographer:    Jyl Heinz Referring Phys: [id number] [place]  1. Left ventricular ejection fraction, by estimation, is 55 to 60%. The left ventricle has normal function. The left ventricle has no regional wall motion abnormalities. There is mild concentric left ventricular hypertrophy. Left ventricular diastolic parameters were normal.  2. Right ventricular  systolic function is normal. The right ventricular size is normal. There is normal pulmonary artery systolic pressure.  3. A small pericardial effusion is present. The pericardial effusion is circumferential. There is no evidence of cardiac tamponade.  4. The mitral valve is normal in structure. Trivial mitral valve regurgitation. No evidence of mitral stenosis.  5. The aortic valve is normal in structure. Aortic valve regurgitation is not visualized. No aortic stenosis is present.  6. The inferior vena cava is dilated in size with <50% respiratory variability, suggesting right atrial pressure of 15 mmHg. Comparison(s): A prior study was performed on 06/18/2020. Compared to prior study the right ventricle is no longer severely dilated. Right atrial is now normal size. Tricuspid regurgitation in no longer moderate, now trivial. FINDINGS  Left Ventricle: Left ventricular ejection fraction, by estimation, is 55 to 60%. The left ventricle has normal function. The left ventricle has no regional wall motion abnormalities. The left ventricular internal cavity size was normal in size. There is  mild concentric left ventricular hypertrophy. Left ventricular diastolic parameters were normal. Right Ventricle: The right ventricular size is normal. No increase in right ventricular wall thickness. Right ventricular systolic function is normal. There is normal pulmonary artery systolic pressure. The tricuspid regurgitant velocity is 2.21 m/s, and  with an assumed right atrial pressure of 15 mmHg, the estimated right ventricular systolic pressure is 33.0 mmHg. Left Atrium: Left atrial size was normal in size. Right Atrium: Right atrial size was normal in size. Pericardium: A small pericardial effusion is present. The pericardial effusion is circumferential. There is no evidence of cardiac tamponade. Presence of epicardial fat layer. Mitral Valve: The mitral valve is normal in structure. Trivial mitral valve regurgitation. No evidence  of mitral valve stenosis. Tricuspid Valve: The tricuspid valve is normal in structure. Tricuspid valve regurgitation is trivial. No evidence of tricuspid stenosis. Aortic Valve: The aortic valve is normal in structure. Aortic valve regurgitation is not visualized. No aortic stenosis is present. Aortic valve peak gradient measures 4.2 mmHg. Pulmonic Valve: The pulmonic valve was not well visualized. Pulmonic valve regurgitation is not visualized. No evidence of pulmonic stenosis. Aorta: The aortic root is normal in size and structure. Venous: The inferior vena cava is dilated in size with less than 50% respiratory variability, suggesting right atrial pressure of 15 mmHg. IAS/Shunts: No atrial level shunt detected  by color flow Doppler.  LEFT VENTRICLE PLAX 2D LVIDd:         3.80 cm     Diastology LVIDs:         2.60 cm     LV e' medial:    5.00 cm/s LV PW:         1.20 cm     LV E/e' medial:  8.7 LV IVS:        1.10 cm     LV e' lateral:   5.33 cm/s LVOT diam:     2.00 cm     LV E/e' lateral: 8.1 LV SV:         46 LV SV Index:   27 LVOT Area:     3.14 cm  LV Volumes (MOD) LV vol d, MOD A2C: 65.9 ml LV vol d, MOD A4C: 82.6 ml LV vol s, MOD A2C: 28.5 ml LV vol s, MOD A4C: 34.7 ml LV SV MOD A2C:     37.4 ml LV SV MOD A4C:     82.6 ml LV SV MOD BP:      43.2 ml RIGHT VENTRICLE            IVC RV Basal diam:  3.40 cm    IVC diam: 2.40 cm RV Mid diam:    2.60 cm RV S prime:     6.96 cm/s TAPSE (M-mode): 1.8 cm LEFT ATRIUM             Index        RIGHT ATRIUM          Index LA diam:        3.80 cm 2.21 cm/m   RA Area:     9.65 cm LA Vol (A2C):   26.4 ml 15.37 ml/m  RA Volume:   19.50 ml 11.35 ml/m LA Vol (A4C):   43.3 ml 25.20 ml/m LA Biplane Vol: 34.4 ml 20.02 ml/m  AORTIC VALVE AV Area (Vmax): 2.29 cm AV Vmax:        102.00 cm/s AV Peak Grad:   4.2 mmHg LVOT Vmax:      74.20 cm/s LVOT Vmean:     50.600 cm/s LVOT VTI:       0.147 m  AORTA Ao Root diam: 3.00 cm MITRAL VALVE               TRICUSPID VALVE MV Area  (PHT): 3.53 cm    TR Peak grad:   19.5 mmHg MV Decel Time: 215 msec    TR Vmax:        221.00 cm/s MV E velocity: 43.30 cm/s MV A velocity: 36.40 cm/s  SHUNTS MV E/A ratio:  1.19        Systemic VTI:  0.15 m                            Systemic Diam: 2.00 cm Kardie Tobb DO Electronically signed by Berniece Salines DO Signature Date/Time: 01/21/2021/3:53:52 PM    Final      Assessment: 50 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  1. Exam while on propofol reveals no motor responses to external stimuli, no eye opening and no attempts to communicate. Intermittent low-amplitude neck twitching is noted.  2. LTM EEG with alpha waves on a low-amplitude background, but no spikes or myoclonic discharges seen.  3. CT head:  Findings suggestive of cerebral edema, with slight narrowing of the sulci  and sylvian fissures and possible increased relative density of the cerebellum, as can be seen with hypoxic ischemic injury. 4. MRI brain: No evidence for anoxic injury or other acute intracranial abnormality. Scattered susceptibility artifact involving the cerebellar hemispheres bilaterally, consistent with prior hemorrhage. This could be related to prior ischemia or other insult. Few additional scattered punctate microhemorrhages seen elsewhere throughout the brain. Findings are new as compared to 2020. On review of images by Neurology, there appears to be equivocal/subtle increased signal symmetrically within the basal ganglia.  5. Given that MRI brain shows no strong evidence for anoxic brain injury, would wait an additional 72 hours for repeat neurological exam off all sedation to assess for prognosis.   Recommendations: 1. Loading with Keppra 2000 mg IV x 1.  2. Continue with scheduled renally-dosed Keppra. Call Pharmacy for dosing.  3. Continue LTM EEG.  4. Continue propofol gtt.  5. Supportive care per CCM.   40 minutes spent in the emergent neurological evaluation and management of this critically  ill patient.    Electronically signed: Dr. Kerney Elbe 02/10/2021, 9:13 PM

## 2021-01-30 NOTE — ED Triage Notes (Addendum)
Pt bib [facility] as post cpr after an unwittnessed arrest with using heroin 2 hours prior. On EMS arrival pt was in PEA with cpr started and gave 2 epis. Pulses back after second epi and epi drip started. NS given en route. On arrival to ED pt has king airway and NPA in with pulses present.

## 2021-01-30 NOTE — Progress Notes (Signed)
eLink Physician-Brief Progress Note Patient Name: David Short DOB: 10/16/1970 MRN: [id number]   Date of Service  02-27-2021  HPI/Events of Note  Patient is influenza positive.  eICU Interventions  Tamiflu ordered.        [name] Milagros Middendorf February 27, 2021, 9:24 PM

## 2021-01-30 NOTE — Progress Notes (Signed)
Patient transported to CT and back to ED room 22 without any apparent complications.

## 2021-01-30 NOTE — Progress Notes (Signed)
Ultrasound guided IV placed for express purpose of vasopressor infusion.  22G 1.75 inch catheter placed with good blood return on first attempt.  Dalphine Handing, RN instructed that this site is for vasopressor infusion, informed that IV watch is not used in the emergency room.  Instructed to inform receiving nurse on the unit that IV watch is to be placed at "X" made on patient's skin to indicate the end of the catheter.  Sheria Lang, RN verbalized understanding.

## 2021-01-30 NOTE — ED Notes (Signed)
Bear hugger placed on pt. MD made aware of temp

## 2021-01-30 NOTE — H&P (Addendum)
NAME:  David Short, MRN:  [id number], DOB:  1970-08-05, LOS: 0 ADMISSION DATE:  02/14/2021 CONSULTATION DATE:  01/29/2021 REFERRING MD:  Jeraldine Loots - EDP CHIEF COMPLAINT:  Post-arrest, possible overdose   History of Present Illness:  50 year old man who presented to [facility] 11/14 via EMS after unwitnessed arrest with suspected heroin overdose (used 2 hours prior). Noted to be in PEA on EMS arrival; CPR initiated and Epi x 2 given with ROSC. Epi gtt initiated. Concern for hypoxic brian injury given prolonged downtime. PMHx significant for CHF (Echo 06/2020 EF 65-70%, severe RV enlargement with elevated PASP), COPD (noncompliant with HOT), polysubstance abuse (IVDU, tobacco, cocaine), Hepatitis B (on Vemlidy), osteomyelitis, IDA and thrombocytopenia. Previously arrested requiring CPR ~07/2020.  [name]) at bedside provides available history. Patient and a friend were reportedly sitting on the porch around 0900 this morning (brother thinks using drugs) when patient began to act "out of it" and retired to his room. He was gone for at least an hour before someone went to check on him and noticed that he was cool to the touch and did not have a pulse. EMS was called and CPR was initiated  (as above). Per patient's brother, he uses several different kinds of drugs (has for many years) and he is not sure what his brother may have taken this morning. He used to be on Suboxone, but stopped going to clinic for this. Brother also notes that he has poorly controlled COPD and has HOT, but does not use it as he should. He has been admitted for cardiac arrest/OD before, most recently being April/May of this year.  On ED arrival, patient was unresponsive with [facility] airway in place copious blood from his nasopharynx. He was subsequently intubated and Levophed was initiated. Hypothermic, Bair hugger placed. ABG 7.148/54.5/560/20.5. Labs notable for WBC 13.4, hyperkalemia (7.9), AKI with Cr 4.67 (baseline 0.5-1), mild  transaminitis, lipase 64, lactic acidosis (LA > 9), BNP >1400, . UDS +cocaine. UA dirty with +nitrite. Respiratory virus panel +Flu A. EKG demonstrated no significant changes from prior. CXR with evidence of COPD, no acute process. CT Head showed diffuse cerebral edema c/f hypoxic brain injury.  PCCM consulted for admission and further management.  Pertinent Medical History:   Past Medical History:  Diagnosis Date   CHF (congestive heart failure) (HCC)    Chronic back pain    COPD (chronic obstructive pulmonary disease) (HCC)    IDA (iron deficiency anemia)    IVDU (intravenous drug user)    heroin    On home oxygen therapy    Osteomyelitis (HCC)    Splenomegaly    Thrombocytopenia (HCC)    Tobacco use    Significant Hospital Events: Including procedures, antibiotic start and stop dates in addition to other pertinent events   11/14 - Presented to ED via EMS post-arrest, ?2 hours downtime post-heroin use. CT Head with cerebral edema and c/f hypoxic ischemic injury. LA > 9, ARF with K 7.9 (shifted), Cr > 4 (baseline < 1).  Interim History / Subjective:  PCCM consulted for admission  Objective:  Blood pressure 126/86, pulse (!) 53, resp. rate 13, height 5\' 9"  (1.753 m), SpO2 100 %.       No intake or output data in the 24 hours ending 01/28/2021 1216 There were no vitals filed for this visit.  Physical Examination: General: Acute-on-chronically ill-appearing middle-aged man, unresponsive. Cachectic and unkempt. HEENT: [place]/AT, anicteric sclera, pupils equal, round and 3mm; nonreactive. Dry mucous membranes. Copious dried blood around  nose, mouth and to L lateral aspect of head. Poor dentition. Neuro:  Unresponsive.  Does not respond to verbal, tactile or noxious stimuli. Not following commands. No spontaneous movement of extremities noted. No cough or gag. CV: RRR, no m/g/r. PULM: Breathing even and unlabored on vent (PEEP 5, FiO2 40%). Poor overall air movement. Lung fields with  faint expiratory wheezing. GI: Soft, nontender, nondistended. Hypoactive bowel sounds. Extremities: No LE edema noted. Bilateral feet covered in dirt. DP pulses present and equal. Skin: Cool/very dry, dried blood noted to R arm, abdomen and face/head as above.  Resolved Hospital Problem List:    Assessment & Plan:  PEA arrest, likely in the setting of drug overdose Concern for hypoxic vs. anoxic brain injury Found unresponsive at home after 1-2 hours of downtime s/p witnessed drug use. PEA on EMS arrival, CPR initiated and Epi x 2 given with ROSC. CT Head with cerebral edema with c/f hypoxic/anoxic brain injury. - Admit to ICU for close monitoring - F/u MRI Brain - LTM EEG, MRI-compatible leads requested - Consider Neuro consult - Neuroprotective measures: HOB > 30 degrees, normoglycemia, normothermia, electrolytes WNL - Cardiac monitoring  Undifferentiated shock, likely mixed cardiogenic and septic Lactic acidosis Influenza A infection Abnormal UA c/f UTI Several lab abnormalities and possible sources of infection. Differential includes PNA, UTI, endocarditis in the setting of IDVU, SSTI. - Trend LA - Goal MAP > 65 - Continue Levophed, titrate to goal MAP - F/u Echo, r/o endocarditis - F/u culture data - Empiric ceftriaxone for now  Acute hypoxemic respiratory failure COPD, noncompliant Intubated on arrival to ED in the setting of cardiac arrest, unresponsiveness. COPD with HOT 2-4L, nonadherent per patient's brother. - Continue full vent support (4-8cc/kg IBW) - Wean FiO2 for O2 sat > 90% - Bronchodilators - Daily WUA/SBT as neurologic status permits - VAP bundle - Pulmonary hygiene - PAD protocol for sedation; hold for now given unclear neurologic exam  AKI with acute renal failure Hyperkalemia S/p medical shifting with insulin/D50, Ca gluconate, sodium bicarbonate gtt + 2 amps, Lasix. - Trend BMP - Replete electrolytes as indicated - Continue bicarb gtt - Monitor  I&Os - F/u urine studies - Avoid nephrotoxic agents as able - Ensure adequate renal perfusion - Nephrology consult  Hepatitis B History of hepatitis B in the setting of IVDU. - Continue Vemlidy as able  Polysubstance abuse Known heroin IVDU, UDS +cocaine. - Consider extended tox screen - Cessation counseling in the event patient recovers from this encounter, though unlikely  Best Practice: (right click and "Reselect all SmartList Selections" daily)   Diet/type: NPO DVT prophylaxis: prophylactic heparin  GI prophylaxis: PPI Lines: N/A Foley:  Yes, and it is still needed Code Status:  full code Last date of multidisciplinary goals of care discussion [Pending]  Labs:  CBC: Recent Labs  Lab 01/27/2021 1213  HGB 15.6  HCT 46.0   Basic Metabolic Panel: Recent Labs  Lab 02/05/2021 1213  NA 138  K 7.9*   GFR: CrCl cannot be calculated (Patient's most recent lab result is older than the maximum 21 days allowed.). No results for input(s): PROCALCITON, WBC, LATICACIDVEN in the last 168 hours.  Liver Function Tests: No results for input(s): AST, ALT, ALKPHOS, BILITOT, PROT, ALBUMIN in the last 168 hours. No results for input(s): LIPASE, AMYLASE in the last 168 hours. No results for input(s): AMMONIA in the last 168 hours.  ABG:    Component Value Date/Time   PHART 7.148 (LL) 02/12/2021 1213   PCO2ART  54.5 (H) Feb 03, 2021 1213   PO2ART 560 (H) 02/04/2021 1213   HCO3 20.5 01/24/2021 1213   TCO2 23 Feb 03, 2021 1213   ACIDBASEDEF 11.0 (H) 01/22/2021 1213   O2SAT 100.0 February 03, 2021 1213    Coagulation Profile: No results for input(s): INR, PROTIME in the last 168 hours.  Cardiac Enzymes: No results for input(s): CKTOTAL, CKMB, CKMBINDEX, TROPONINI in the last 168 hours.  HbA1C: Hgb A1c MFr Bld  Date/Time Value Ref Range Status  06/26/2020 10:41 AM 5.6 4.8 - 5.6 % Final    Comment:    (NOTE) Pre diabetes:          5.7%-6.4%  Diabetes:              >6.4%  Glycemic  control for   <7.0% adults with diabetes   06/23/2020 06:27 AM 5.6 4.8 - 5.6 % Final    Comment:    (NOTE) Pre diabetes:          5.7%-6.4%  Diabetes:              >6.4%  Glycemic control for   <7.0% adults with diabetes    CBG: No results for input(s): GLUCAP in the last 168 hours.  Review of Systems:   Patient is encephalopathic and/or intubated. Therefore history has been obtained from chart review.   Past Medical History:  He,  has a past medical history of CHF (congestive heart failure) (HCC), Chronic back pain, COPD (chronic obstructive pulmonary disease) (HCC), IDA (iron deficiency anemia), IVDU (intravenous drug user), On home oxygen therapy, Osteomyelitis (HCC), Splenomegaly, Thrombocytopenia (HCC), and Tobacco use.   Surgical History:   Past Surgical History:  Procedure Laterality Date   ANTERIOR CERVICAL DECOMP/DISCECTOMY FUSION N/A 12/14/2018   Procedure: ANTERIOR CERVICAL DISCECTOMY FUSION CERVICAL FIVE- CERVICAL SIX;  Surgeon: Tia Alert, MD;  Location: [facility] OR;  Service: Neurosurgery;  Laterality: N/A;   BACK SURGERY     states never had surgery   FRACTURE SURGERY Left    elbow/wrist/ankle    Social History:   reports that he has been smoking cigarettes. He has been smoking an average of 1 pack per day. He has never used smokeless tobacco. He reports that he does not currently use alcohol. He reports that he does not currently use drugs after having used the following drugs: IV and Heroin.   Family History:  His family history includes Colon cancer in his maternal grandfather and maternal grandmother; Hypertension in his brother and sister; Prostate cancer in his maternal grandfather.   Allergies: Allergies  Allergen Reactions   Tylenol [Acetaminophen] Hives    Home Medications: Prior to Admission medications   Medication Sig Start Date End Date Taking? Authorizing Provider  albuterol (VENTOLIN HFA) 108 (90 Base) MCG/ACT inhaler Inhale 2 puffs into the  lungs every 6 (six) hours as needed for wheezing or shortness of breath. 10/11/20   Storm Frisk, MD  azithromycin (ZITHROMAX) 250 MG tablet Take two once as 1 dose, then one daily until gone 10/11/20   Storm Frisk, MD  budesonide-formoterol [facility]) 160-4.5 MCG/ACT inhaler Inhale 2 puffs into the lungs in the morning and at bedtime. 10/11/20   Storm Frisk, MD  Buprenorphine HCl-Naloxone HCl (SUBOXONE) 8-2 MG FILM Place 2 Film under the tongue daily.    [provider]  carvedilol (COREG) 3.125 MG tablet Take 3.125 mg by mouth 2 (two) times daily with a meal.    [provider]  ferrous sulfate 325 (65 FE) MG  EC tablet Take 1 tablet (325 mg total) by mouth daily with breakfast. 08/02/20   Georga Kaufmann T, PA-C  furosemide (LASIX) 20 MG tablet Take 1 tablet (20 mg total) by mouth daily. 10/11/20   Storm Frisk, MD  nicotine (NICODERM CQ - DOSED IN MG/24 HOURS) 21 mg/24hr patch Place 1 patch (21 mg total) onto the skin daily. 10/11/20   Storm Frisk, MD  pantoprazole (PROTONIX) 40 MG tablet Take 1 tablet (40 mg total) by mouth daily. 06/26/20   Rai, Delene Ruffini, MD  predniSONE (DELTASONE) 10 MG tablet Take 4 tablets daily for 5 days then stop 10/11/20   Storm Frisk, MD  Tenofovir Alafenamide Fumarate (VEMLIDY) 25 MG TABS Take 1 tablet (25 mg total) by mouth daily. 09/12/20   Kuppelweiser, Cherylann Ratel, RPH-CPP    Critical care time: 50 minutes   Tim Lair, PA-C [facility] 16-Feb-2021 12:16 PM  Please see [email or web address] for pager details.  From 7A-7P if no response, please call [phone] After hours, please call ELink [phone]

## 2021-01-30 NOTE — ED Provider Notes (Signed)
[facility] EMERGENCY DEPARTMENT Provider Note   CSN: [id number] Arrival date & time: 02/14/2021  1057     History No chief complaint on file.   David Short is a 50 y.o. male via EMS status post arrest and CPR.  Patient was witnessed using heroin 2 hours prior to downtime, approximately 2 hours unknown downtime.  Patient comes in unresponsive, King airway in place, but with active pulse, active bleeding from nostril.  Was given epinephrine x2 in route, and started epi drip.  Patient with known COPD, heart failure, multiple forms of hepatitis, IV drug use, history of osteomyelitis.  No Narcan administered prior to arrival.  Level 5 Caveat: Patient post-arrest   HPI     Past Medical History:  Diagnosis Date   CHF (congestive heart failure) (HCC)    Chronic back pain    COPD (chronic obstructive pulmonary disease) (HCC)    IDA (iron deficiency anemia)    IVDU (intravenous drug user)    heroin    On home oxygen therapy    Osteomyelitis (HCC)    Splenomegaly    Thrombocytopenia (HCC)    Tobacco use     Patient Active Problem List   Diagnosis Date Noted   Cardiac arrest ([place]) 02/01/2021   COPD, severe ([place]) 09/16/2020   Chronic respiratory failure with hypoxia, on home oxygen therapy ([place]) 09/16/2020   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/16/2020   Retroperitoneal lymphadenopathy 09/16/2020   Tobacco dependence 09/16/2020   Pulmonary emphysema ([place]) 08/22/2020   Sternal fracture, suspected  08/22/2020   IV drug abuse ([place]) 07/22/2020   History of hepatitis C 07/22/2020   Hepatitis B infection 07/22/2020   Heroin abuse ([place]) 07/20/2020   Cocaine abuse ([place]) 07/20/2020   Lymphadenopathy 06/28/2020   Other ascites    Iron deficiency anemia    CHF (congestive heart failure) ([place]) 06/18/2020   Vertebral osteomyelitis, acute-C5-C6 12/26/2018   Malnutrition of moderate degree 12/15/2018   S/P cervical spinal fusion 12/14/2018    HTN (hypertension) 12/12/2018   Thrombocytopenia ([place]) 12/12/2018    Past Surgical History:  Procedure Laterality Date   ANTERIOR CERVICAL DECOMP/DISCECTOMY FUSION N/A 12/14/2018   Procedure: ANTERIOR CERVICAL DISCECTOMY FUSION CERVICAL FIVE- CERVICAL SIX;  Surgeon: Eustace Moore, MD;  Location: [facility];  Service: Neurosurgery;  Laterality: N/A;   BACK SURGERY     states never had surgery   FRACTURE SURGERY Left    elbow/wrist/ankle       Family History  Problem Relation Age of Onset   Hypertension Sister    Hypertension Brother    Colon cancer Maternal Grandmother    Prostate cancer Maternal Grandfather    Colon cancer Maternal Grandfather     Social History   Tobacco Use   Smoking status: Every Day    Packs/day: 1.00    Types: Cigarettes   Smokeless tobacco: Never  Vaping Use   Vaping Use: Never used  Substance Use Topics   Alcohol use: Not Currently   Drug use: Not Currently    Types: IV, Heroin    Comment: IV heroin last use 07/21/20    Home Medications Prior to Admission medications   Medication Sig Start Date End Date Taking? Authorizing Provider  albuterol (VENTOLIN HFA) 108 (90 Base) MCG/ACT inhaler Inhale 2 puffs into the lungs every 6 (six) hours as needed for wheezing or shortness of breath. 10/11/20   Elsie Stain, MD  azithromycin (ZITHROMAX) 250 MG tablet Take two once  as 1 dose, then one daily until gone 10/11/20   Elsie Stain, MD  budesonide-formoterol [facility]) 160-4.5 MCG/ACT inhaler Inhale 2 puffs into the lungs in the morning and at bedtime. 10/11/20   Elsie Stain, MD  Buprenorphine HCl-Naloxone HCl (SUBOXONE) 8-2 MG FILM Place 2 Film under the tongue daily.    [provider]  carvedilol (COREG) 3.125 MG tablet Take 3.125 mg by mouth 2 (two) times daily with a meal.    [provider]  ferrous sulfate 325 (65 FE) MG EC tablet Take 1 tablet (325 mg total) by mouth daily with breakfast. 08/02/20   Lincoln Brigham, PA-C   furosemide (LASIX) 20 MG tablet Take 1 tablet (20 mg total) by mouth daily. 10/11/20   Elsie Stain, MD  nicotine (NICODERM CQ - DOSED IN MG/24 HOURS) 21 mg/24hr patch Place 1 patch (21 mg total) onto the skin daily. 10/11/20   Elsie Stain, MD  pantoprazole (PROTONIX) 40 MG tablet Take 1 tablet (40 mg total) by mouth daily. 06/26/20   Rai, Vernelle Emerald, MD  predniSONE (DELTASONE) 10 MG tablet Take 4 tablets daily for 5 days then stop 10/11/20   Elsie Stain, MD  Tenofovir Alafenamide Fumarate (VEMLIDY) 25 MG TABS Take 1 tablet (25 mg total) by mouth daily. 09/12/20   Kuppelweiser, Cassie L, RPH-CPP    Allergies    Tylenol [acetaminophen]  Review of Systems   Review of Systems  Reason unable to perform ROS: post-arrest unresponsive.   Physical Exam Updated Vital Signs BP 132/83   Pulse 66   Temp (!) 90.3 F (32.4 C)   Resp (!) 24   Ht _0  (1.753 m)   SpO2 100%   BMI 19.14 kg/m   Physical Exam Vitals and nursing note reviewed.  Constitutional:      Appearance: Normal appearance. He is ill-appearing.     Comments: Unresponsive, king airway in place, blood coming from [place], patient dirty, disheveled, somewhat cold to the touch, cachectic body habitus  HENT:     Mouth/Throat:     Comments: Poor dentition, blood in oropharynx seemingly originating from throat Cardiovascular:     Rate and Rhythm: Normal rate and regular rhythm.     Heart sounds: No murmur heard.   No friction rub. No gallop.  Pulmonary:     Breath sounds: No rhonchi.     Comments: Diminished breath sounds bilaterally with bag-airway Abdominal:     Comments: No ecchymosis or sign of hemorrhage, liver edge palpated 3cm below costal margin  Musculoskeletal:        General: No deformity.  Skin:    Comments: Cold, dry, covered in dirt, grime Multiple trackmarks bilateral antecubital fossa  Neurological:     Mental Status: He is alert.     Comments: Pupils unreactive to light, touch, with some  clouding of sclera  Psychiatric:     Comments: Unable to assess as patient is post-arrest unresponsive    ED Results / Procedures / Treatments   Labs (all labs ordered are listed, but only abnormal results are displayed) Labs Reviewed  RESP PANEL BY RT-PCR (FLU A&B, COVID) ARPGX2 - Abnormal; Notable for the following components:      Result Value   Influenza A by PCR POSITIVE (*)    All other components within normal limits  COMPREHENSIVE METABOLIC PANEL - Abnormal; Notable for the following components:   Potassium 7.2 (*)    CO2 20 (*)    Glucose, Bld  52 (*)    BUN 80 (*)    Creatinine, Ser 4.67 (*)    Calcium 8.7 (*)    Total Protein 8.4 (*)    Albumin 3.0 (*)    AST 196 (*)    ALT 86 (*)    Total Bilirubin 2.0 (*)    GFR, Estimated 14 (*)    All other components within normal limits  LIPASE, BLOOD - Abnormal; Notable for the following components:   Lipase 64 (*)    All other components within normal limits  LACTIC ACID, PLASMA - Abnormal; Notable for the following components:   Lactic Acid, Venous >9.0 (*)    All other components within normal limits  CBC WITH DIFFERENTIAL/PLATELET - Abnormal; Notable for the following components:   WBC 13.4 (*)    HCT 53.1 (*)    MCH 23.4 (*)    MCHC 25.4 (*)    RDW 19.9 (*)    nRBC 0.5 (*)    Neutro Abs 10.7 (*)    Abs Immature Granulocytes 0.90 (*)    All other components within normal limits  BRAIN NATRIURETIC PEPTIDE - Abnormal; Notable for the following components:   B Natriuretic Peptide 1,433.6 (*)    All other components within normal limits  URINALYSIS, ROUTINE W REFLEX MICROSCOPIC - Abnormal; Notable for the following components:   APPearance CLOUDY (*)    Specific Gravity, Urine >1.030 (*)    Hgb urine dipstick LARGE (*)    Protein, ur 100 (*)    Nitrite POSITIVE (*)    Leukocytes,Ua SMALL (*)    All other components within normal limits  RAPID URINE DRUG SCREEN, HOSP PERFORMED - Abnormal; Notable for the following  components:   Cocaine POSITIVE (*)    All other components within normal limits  URINALYSIS, MICROSCOPIC (REFLEX) - Abnormal; Notable for the following components:   Bacteria, UA MANY (*)    Non Squamous Epithelial PRESENT (*)    All other components within normal limits  I-STAT ARTERIAL BLOOD GAS, ED - Abnormal; Notable for the following components:   pH, Arterial 7.148 (*)    pCO2 arterial 54.5 (*)    pO2, Arterial 560 (*)    Acid-base deficit 11.0 (*)    Potassium 7.9 (*)    Calcium, Ion 1.07 (*)    All other components within normal limits  CBG MONITORING, ED - Abnormal; Notable for the following components:   Glucose-Capillary 197 (*)    All other components within normal limits  ETHANOL  LACTIC ACID, PLASMA  PATHOLOGIST SMEAR REVIEW  BLOOD GAS, ARTERIAL  BLOOD GAS, ARTERIAL  CBC  I-STAT CHEM 8, ED    EKG None  Radiology CT Head Wo Contrast  Result Date: 01/18/2021 CLINICAL DATA:  Anoxic brain damage, post arrest EXAM: CT HEAD WITHOUT CONTRAST TECHNIQUE: Contiguous axial images were obtained from the base of the skull through the vertex without intravenous contrast. COMPARISON:  06/17/2020 FINDINGS: Brain: Slight narrowing of the sulci and sylvian fissures, which could indicate edema. Possible increased relative density of the cerebellum. No hypodensity to suggest acute infarct. No mass, mass effect, or midline shift. No acute hemorrhage. No extra-axial collection or hydrocephalus. Vascular: No hyperdense vessel. Skull: No acute osseous abnormality. Sinuses/Orbits: Partial opacification of the ethmoid air cells, with layering fluid in the maxillary and sphenoid sinuses. Other: The mastoids are well aerated. IMPRESSION: Findings suggestive of cerebral edema, with slight narrowing of the sulci and sylvian fissures and possible increased relative density of the cerebellum,  as can be seen with hypoxic ischemic injury. An MRI is recommended for further evaluation. Electronically  Signed   By: Merilyn Baba M.D.   On: 01/26/2021 12:04   DG Chest Port 1 View  Result Date: 01/21/2021 CLINICAL DATA:  Altered level of consciousness. EXAM: PORTABLE CHEST 1 VIEW COMPARISON:  Radiographs 07/15/2020 and 06/24/2020.  CT 06/17/2020. FINDINGS: 1118 hours. Two views are submitted. Tip of the endotracheal tube is in the mid trachea. An enteric tube projects below the diaphragm, tip not visualized. The heart size and mediastinal contours are stable. The lungs are hyperinflated without evidence of superimposed airspace disease, edema, pleural effusion or pneumothorax. No acute osseous findings are evident. Multiple telemetry leads overlie the chest. Patient is status post lower cervical fusion. IMPRESSION: 1. Satisfactory position of the endotracheal and enteric tubes. 2. Chronic obstructive pulmonary disease without evidence of acute cardiopulmonary process. Electronically Signed   By: Richardean Sale M.D.   On: 01/23/2021 11:28    Procedures Procedure Name: Awake intubation Date/Time: 02/13/2021 1:02 PM Performed by: Anselmo Pickler, PA-C Pre-anesthesia Checklist: Emergency Drugs available, Patient being monitored, Suction available and Patient identified Oxygen Delivery Method: Simple face mask Preoxygenation: Pre-oxygenation with 100% oxygen Induction Type: Rapid sequence Ventilation: Mask ventilation with difficulty and Nasal airway inserted- appropriate to patient size Laryngoscope Size: 4 and Mac Grade View: Grade I Tube size: 7.5 mm Number of attempts: 1 Airway Equipment and Method: Rigid stylet and Fiberoptic brochoscope Placement Confirmation: ETT inserted through vocal cords under direct vision, Positive ETCO2, Breath sounds checked- equal and bilateral and CO2 detector Dental Injury: Teeth and Oropharynx as per pre-operative assessment  Difficulty Due To: Difficulty was anticipated    .Critical Care Performed by: Anselmo Pickler, PA-C Authorized by:  Anselmo Pickler, PA-C   Critical care provider statement:    Critical care time (minutes):  41   Critical care was necessary to treat or prevent imminent or life-threatening deterioration of the following conditions:  Circulatory failure, CNS failure or compromise, cardiac failure and respiratory failure   Critical care was time spent personally by me on the following activities:  Discussions with consultants, ordering and review of radiographic studies, ordering and review of laboratory studies, ordering and performing treatments and interventions, evaluation of patient's response to treatment, review of old charts, examination of patient, ventilator management and interpretation of cardiac output measurements   Care discussed with: admitting provider     Medications Ordered in ED Medications  etomidate (AMIDATE) injection (20 mg Intravenous Given 02/15/2021 1106)  rocuronium (ZEMURON) injection (100 mg Intravenous Given 01/26/2021 1107)  fentaNYL (SUBLIMAZE) injection 100 mcg (has no administration in time range)  midazolam (VERSED) injection 2 mg (has no administration in time range)  midazolam (VERSED) injection 2 mg (has no administration in time range)  sodium bicarbonate 150 mEq in dextrose 5 % 1,150 mL infusion (has no administration in time range)  docusate (COLACE) 50 MG/5ML liquid 100 mg (has no administration in time range)  polyethylene glycol (MIRALAX / GLYCOLAX) packet 17 g (has no administration in time range)  ondansetron (ZOFRAN) injection 4 mg (has no administration in time range)  busPIRone (BUSPAR) tablet 30 mg (has no administration in time range)    Or  busPIRone (BUSPAR) tablet 30 mg (has no administration in time range)  magnesium sulfate IVPB 2 g 50 mL (2 g Intravenous New Bag/Given 02/15/2021 1339)  norepinephrine (LEVOPHED) 51m in 2552mpremix infusion (5 mcg/min Intravenous New Bag/Given 02/06/2021 1314)  0.9 %  sodium chloride infusion (has no administration in  time range)  pantoprazole (PROTONIX) injection 40 mg (has no administration in time range)  heparin injection 5,000 Units (5,000 Units Subcutaneous Given 02/13/2021 1346)  sodium chloride 0.9 % bolus 500 mL (500 mLs Intravenous New Bag/Given 02/09/2021 1254)  calcium gluconate inj 10% (1 g) URGENT USE ONLY! (1 g Intravenous Given 01/18/2021 1255)  albuterol (PROVENTIL) (2.5 MG/3ML) 0.083% nebulizer solution 10 mg (10 mg Nebulization Given 01/20/2021 1241)  insulin aspart (novoLOG) injection 10 Units (10 Units Intravenous Given 01/31/2021 1258)    And  dextrose 50 % solution 50 mL (50 mLs Intravenous Given 01/28/2021 1258)  sodium bicarbonate injection 100 mEq (100 mEq Intravenous Given 01/24/2021 1329)    ED Course  I have reviewed the triage vital signs and the nursing notes.  Pertinent labs & imaging results that were available during my care of the patient were reviewed by me and considered in my medical decision making (see chart for details).     MDM Rules/Calculators/A&P                         I discussed this case with my attending physician who cosigned this note including patient's presenting symptoms, physical exam, and planned diagnostics and interventions. Attending physician stated agreement with plan or made changes to plan which were implemented.   Attending physician assessed patient at bedside.  Arrival: post arrest, concern for heroin overdose vs cardiac arrest, concern for airway. Unreactive pupillary / blink response, concern for anoxic brain injury considering 2 hours of downtime.  Spoke with PCCM regarding patient s/p stabilization and intubation. Patient still requiring pressors at time of arrival, transitioned to levophed. Blood pressure stable at this time. Temp sensing cath shows hypothermic patient -- rewarming begun with bair hugger. Initial labwork shows severe hyperkalemia to 7.9, will enact ED hyperkalemia protocol w/o lokelma as patient is intubated and unresponsive at  this time. Additionally acidotic to 7.148, bicarb initiated. Lactic acid of >9 suggests diffuse tissue hypoxia.  Initial drug screen positive for cocaine. Patient stable and continues to be unresponsive. CT head does shoes evidence of likely anoxic brain injury. Will order MRI to further assess for brain injury. Spoke with family about ongoing workup but overall poor prognosis. Patient under care of PCCM team at this time. Final Clinical Impression(s) / ED Diagnoses Final diagnoses:  None    Rx / DC Orders ED Discharge Orders     None        Dorien Chihuahua 01/23/2021 1356    Carmin Muskrat, MD 01/31/21 2216

## 2021-01-31 ENCOUNTER — Inpatient Hospital Stay (HOSPITAL_COMMUNITY): Payer: Medicaid Other

## 2021-01-31 DIAGNOSIS — E43 Unspecified severe protein-calorie malnutrition: Secondary | ICD-10-CM | POA: Insufficient documentation

## 2021-01-31 DIAGNOSIS — G931 Anoxic brain damage, not elsewhere classified: Secondary | ICD-10-CM | POA: Diagnosis not present

## 2021-01-31 LAB — BASIC METABOLIC PANEL
Anion gap: 14 (ref 5–15)
BUN: 96 mg/dL — ABNORMAL HIGH (ref 6–20)
CO2: 21 mmol/L — ABNORMAL LOW (ref 22–32)
Calcium: 7.9 mg/dL — ABNORMAL LOW (ref 8.9–10.3)
Chloride: 104 mmol/L (ref 98–111)
Creatinine, Ser: 4.58 mg/dL — ABNORMAL HIGH (ref 0.61–1.24)
GFR, Estimated: 15 mL/min — ABNORMAL LOW (ref >60)
Glucose, Bld: 94 mg/dL (ref 70–99)
Potassium: 4.9 mmol/L (ref 3.5–5.1)
Sodium: 139 mmol/L (ref 135–145)

## 2021-01-31 LAB — CBC
HCT: 45.4 % (ref 39.0–52.0)
Hemoglobin: 12.6 g/dL — ABNORMAL LOW (ref 13.0–17.0)
MCH: 22.4 pg — ABNORMAL LOW (ref 26.0–34.0)
MCHC: 27.8 g/dL — ABNORMAL LOW (ref 30.0–36.0)
MCV: 80.6 fL (ref 80.0–100.0)
Platelets: 78 10*3/uL — ABNORMAL LOW (ref 150–400)
RBC: 5.63 MIL/uL (ref 4.22–5.81)
RDW: 20.5 % — ABNORMAL HIGH (ref 11.5–15.5)
WBC: 7.1 10*3/uL (ref 4.0–10.5)
nRBC: 0 % (ref 0.0–0.2)

## 2021-01-31 LAB — MAGNESIUM
Magnesium: 3 mg/dL — ABNORMAL HIGH (ref 1.7–2.4)
Magnesium: 2.8 mg/dL — ABNORMAL HIGH (ref 1.7–2.4)

## 2021-01-31 LAB — GLUCOSE, CAPILLARY
Glucose-Capillary: 75 mg/dL (ref 70–99)
Glucose-Capillary: 78 mg/dL (ref 70–99)
Glucose-Capillary: 93 mg/dL (ref 70–99)
Glucose-Capillary: 98 mg/dL (ref 70–99)
Glucose-Capillary: 98 mg/dL (ref 70–99)
Glucose-Capillary: 99 mg/dL (ref 70–99)

## 2021-01-31 LAB — PATHOLOGIST SMEAR REVIEW

## 2021-01-31 LAB — TROPONIN I (HIGH SENSITIVITY)
Troponin I (High Sensitivity): 51 ng/L — ABNORMAL HIGH (ref <18)
Troponin I (High Sensitivity): 44 ng/L — ABNORMAL HIGH (ref <18)

## 2021-01-31 LAB — LACTIC ACID, PLASMA: Lactic Acid, Venous: 0.8 mmol/L (ref 0.5–1.9)

## 2021-01-31 LAB — TRIGLYCERIDES: Triglycerides: 193 mg/dL — ABNORMAL HIGH (ref <150)

## 2021-01-31 LAB — PHOSPHORUS: Phosphorus: 5.5 mg/dL — ABNORMAL HIGH (ref 2.5–4.6)

## 2021-01-31 MED ORDER — LORAZEPAM 2 MG/ML IJ SOLN
INTRAMUSCULAR | Status: AC
  Administered 2021-01-31: 2 mg via INTRAVENOUS
  Filled 2021-01-31: qty 1

## 2021-01-31 MED ORDER — SODIUM CHLORIDE 0.9 % IV SOLN
2000.0000 mg | Freq: Once | INTRAVENOUS | Status: AC
  Administered 2021-01-31: 2000 mg via INTRAVENOUS
  Filled 2021-01-31: qty 20

## 2021-01-31 MED ORDER — LEVETIRACETAM IN NACL 1000 MG/100ML IV SOLN: 1000.0000 mg | Freq: Two times a day (BID) | INTRAVENOUS | Status: DC

## 2021-01-31 MED ORDER — LEVETIRACETAM IN NACL 500 MG/100ML IV SOLN
500.0000 mg | Freq: Two times a day (BID) | INTRAVENOUS | Status: DC
  Administered 2021-01-31 – 2021-02-03 (×6): 500 mg via INTRAVENOUS
  Filled 2021-01-31 (×6): qty 100

## 2021-01-31 MED ORDER — LORAZEPAM 2 MG/ML IJ SOLN: 2.0000 mg | INTRAMUSCULAR | Status: AC

## 2021-01-31 MED ORDER — VITAL 1.5 CAL PO LIQD
1000.0000 mL | ORAL | Status: DC
  Administered 2021-01-31 – 2021-02-03 (×3): 1000 mL
  Filled 2021-01-31 (×2): qty 1000

## 2021-01-31 MED ORDER — PANTOPRAZOLE 2 MG/ML SUSPENSION
40.0000 mg | Freq: Every day | ORAL | Status: DC
  Administered 2021-01-31 – 2021-02-02 (×3): 40 mg
  Filled 2021-01-31 (×3): qty 20

## 2021-01-31 MED ORDER — OSELTAMIVIR PHOSPHATE 30 MG PO CAPS
30.0000 mg | ORAL_CAPSULE | Freq: Every day | ORAL | Status: DC
  Administered 2021-01-31 – 2021-02-02 (×3): 30 mg
  Filled 2021-01-31 (×4): qty 1

## 2021-01-31 MED ORDER — ADULT MULTIVITAMIN W/MINERALS CH
1.0000 | ORAL_TABLET | Freq: Every day | ORAL | Status: DC
  Administered 2021-01-31 – 2021-02-01 (×2): 1
  Filled 2021-01-31 (×2): qty 1

## 2021-01-31 MED ORDER — LACTATED RINGERS IV BOLUS
2000.0000 mL | Freq: Once | INTRAVENOUS | Status: AC
  Administered 2021-01-31: 2000 mL via INTRAVENOUS

## 2021-01-31 NOTE — Procedures (Addendum)
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  EEG to evaluate for seizure.  Level of alertness: comatose  AEDs during EEG study: Propofol, LEV  Technical aspects: This EEG study was done with scalp electrodes positioned according to the 10-20 International system of electrode placement. Electrical activity was acquired at a sampling rate of 500Hz  and reviewed with a high frequency filter of 70Hz  and a low frequency filter of 1Hz . EEG data were recorded continuously and digitally stored.   Description: EEG showed continuous generalized background suppression.  EEG was not reactive to tactile stimulation.  Hyperventilation and photic stimulation were not performed.     ABNORMALITY -Background suppression, generalized  IMPRESSION: This study is suggestive of profound diffuse encephalopathy, nonspecific etiology.  However given history of cardiac arrest could be significant anoxic/hypoxic brain injury.  No definite seizures or epileptiform discharges were seen throughout the recording.  Alias Villagran 

## 2021-01-31 NOTE — Progress Notes (Signed)
EEG maintenance performed.  No skin breakdown observed at electrode sites Fp1, Fp2. 

## 2021-01-31 NOTE — Procedures (Addendum)
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  EEG to evaluate for seizure.   Level of alertness: comatose   AEDs during EEG study: Propofol, LEV   Technical aspects: This EEG study was done with scalp electrodes positioned according to the 10-20 International system of electrode placement. Electrical activity was acquired at a sampling rate of 500Hz  and reviewed with a high frequency filter of 70Hz  and a low frequency filter of 1Hz . EEG data were recorded continuously and digitally stored.    Description: EEG initially showed continuous generalized background suppression. Gradually, EEG improved and showed 4 to 6 Hz theta slowing admixed with intermittent 1 to 3 seconds generalized EEG attenuation.    Patient was noted to have intermittent bilateral shoulder and lower extremity semirhythmic jerking.  Concomitant EEG before, during and after the event did not show any definitive EEG change to suggest seizure.  Hyperventilation and photic stimulation were not performed.      ABNORMALITY -Continuous slow, generalized   IMPRESSION: This study is suggestive of moderate to severe diffuse encephalopathy, nonspecific etiology. Patient was also noted to have intermittent bilateral shoulder and lower extremity semirhythmic jerking. Concomitant EEG did not show any definitive EEG change to suggest seizure.   Dewight Catino 

## 2021-01-31 NOTE — Progress Notes (Signed)
Patient transported to MRI 

## 2021-01-31 NOTE — Progress Notes (Addendum)
NAME:  David Short, MRN:  [id number], DOB:  1970/12/01, LOS: 1 ADMISSION DATE:  01/26/2021 CONSULTATION DATE:  01/29/2021 REFERRING MD:  Jeraldine Loots - EDP CHIEF COMPLAINT:  Post-arrest, possible overdose   History of Present Illness:  50 year old man who presented to [facility] 11/14 via EMS after unwitnessed arrest with suspected heroin overdose (used 2 hours prior). Noted to be in PEA on EMS arrival; CPR initiated and Epi x 2 given with ROSC. Epi gtt initiated. Concern for hypoxic brian injury given prolonged downtime. PMHx significant for CHF (Echo 06/2020 EF 65-70%, severe RV enlargement with elevated PASP), COPD (noncompliant with HOT), polysubstance abuse (IVDU, tobacco, cocaine), Hepatitis B (on Vemlidy), osteomyelitis, IDA and thrombocytopenia. Previously arrested requiring CPR ~06/2020.  On ED arrival, patient was unresponsive with [facility] airway in place copious blood from his nasopharynx. He was subsequently intubated and Levophed was initiated. Hypothermic, Bair hugger placed. ABG 7.148/54.5/560/20.5. Labs notable for WBC 13.4, hyperkalemia (7.9), AKI with Cr 4.67 (baseline 0.5-1), mild transaminitis, lipase 64, lactic acidosis (LA > 9), BNP >1400, . UDS +cocaine. UA dirty with +nitrite. Respiratory virus panel +Flu A. EKG demonstrated no significant changes from prior. CXR with evidence of COPD, no acute process. CT Head showed diffuse cerebral edema c/f hypoxic brain injury.  PCCM consulted for admission and further management.  Pertinent Medical History:   Past Medical History:  Diagnosis Date   CHF (congestive heart failure) (HCC)    Chronic back pain    COPD (chronic obstructive pulmonary disease) (HCC)    IDA (iron deficiency anemia)    IVDU (intravenous drug user)    heroin    On home oxygen therapy    Osteomyelitis (HCC)    Splenomegaly    Thrombocytopenia (HCC)    Tobacco use    Significant Hospital Events: Including procedures, antibiotic start and stop dates in addition to  other pertinent events   11/14 - Presented to ED via EMS post-arrest, ?2 hours downtime post-heroin use. CT Head with cerebral edema and c/f hypoxic ischemic injury. LA > 9, ARF with K 7.9 (shifted), Cr > 4 (baseline < 1). 11/15 - Occasional myoclonus. Ongoing LTM EEG without seizure activity. Neuro exam slightly improved from prior, but remains poor even off of sedation. MRI brain without significant abnormality. Neuro following.  Interim History / Subjective:  Patient's family members at bedside LTM EEG in place Occasional jerking/myoclonus Off sedation Neuro exam slightly improved, but prognosis remains overall poor MRI Brain without acute abnormalities  Objective:  Blood pressure 115/88, pulse 86, temperature (!) 96.8 F (36 C), temperature source Bladder, resp. rate (!) 24, height 5\' 9"  (1.753 m), weight 58.8 kg, SpO2 96 %.    Vent Mode: PRVC FiO2 (%):  [40 %-100 %] 40 % Set Rate:  [16 bmp-24 bmp] 24 bmp Vt Set:  [510 mL-560 mL] 560 mL PEEP:  [5 cmH20] 5 cmH20 Plateau Pressure:  [18 cmH20-25 cmH20] 18 cmH20   Intake/Output Summary (Last 24 hours) at 01/31/2021 0758 Last data filed at 01/31/2021 0700 Gross per 24 hour  Intake 725.47 ml  Output 112 ml  Net 613.47 ml   Filed Weights   01/20/2021 2000  Weight: 58.8 kg   Physical Examination: General: Chronically ill-appearing middle-aged man, unresponsive, in NAD. Cachectic. HEENT: [place]/AT, anicteric sclera, PERRL 64mm, dry mucous membranes. Poor dentition. ETT/OGT in place. Neuro:  Minimally responsive.  Withdraws to pain in BLE extremities. No response in BUE. Not following commands. No spontaneous movement of extremities noted during exam. +Corneal, +Cough, and +  Gag  CV: RRR, no m/g/r. PULM: Breathing even and unlabored on vent (PEEP 5, FiO2 40%). Lung fields coarse bilaterally. GI: Soft, nontender, nondistended. Normoactive bowel sounds. Extremities: No BLE edema noted. Skin: Warm/very dry, mild erythema from ankle/foot  bilaterally. No open wounds noted.  Resolved Hospital Problem List:    Assessment & Plan:  PEA arrest, likely in the setting of drug overdose Concern for hypoxic vs. anoxic brain injury Found unresponsive at home after 1-2 hours of downtime s/p witnessed drug use. PEA on EMS arrival, CPR initiated and Epi x 2 given with ROSC. CT Head with cerebral edema with c/f hypoxic/anoxic brain injury. MRI Brain without significant intracranial abnormality. Unfortunately, early myoclonus and ?suppression on EEG contributing to poor overall prognosis. - Neurology consulted, following - appreciate recs - Continue LTM EEG - S/p Keppra load - AEDs per Neuro - Neuroprotective measures: HOB > 30 degrees, normoglycemia, normothermia, electrolytes WNL - Likely needs more time; spoke with family at bedside re: timeframe of assessing for recovery s/p anoxic brain injury; will readdress GOC at 48-72 hours post-arrest  Undifferentiated shock, likely mixed cardiogenic and septic Lactic acidosis Influenza A infection Abnormal UA c/f UTI Several lab abnormalities and possible sources of infection. Differential includes PNA, UTI, endocarditis in the setting of IDVU, SSTI. Echo with slightly decreased EF from prior (55-60%), improved RA enlargement, persistent small pericardial effusion. - Trend LA to normal - Goal MAP > 65 - Off of Levophed 11/15 - F/u Cx data - Empiric ceftriaxone until Cx resulted - Continue Tamiflu  Acute hypoxemic respiratory failure COPD, noncompliant Intubated on arrival to ED in the setting of cardiac arrest, unresponsiveness. COPD with HOT 2-4L, nonadherent per patient's brother. - Continue full vent support (4-8cc/kg IBW) - Wean FiO2 for O2 sat > 90% - Bronchodilators - Daily WUA/SBT as neuro status permits - VAP bundle - Pulmonary hygiene - PAD protocol for sedation: Propofol for goal RASS 0 to -1; HELD for more accurate neuro exam  AKI with acute renal failure, likely ATN r/t  downtime/shock/sepsis Hyperkalemia S/p medical shifting with insulin/D50, Ca gluconate, sodium bicarbonate gtt + 2 amps, Lasix. - Trend BMP - Replete electrolytes as indicated - Bicarb gtt - Monitor I&Os - F/u urine studies - Avoid nephrotoxic agents as able - Ensure adequate renal perfusion - Consider Nephro consult - Discussed with family that if renal function does not improve, patient may require dialysis  Hepatitis B History of hepatitis B in the setting of IVDU. - Continue Vemlidy as able  Polysubstance abuse Known heroin IVDU, UDS +cocaine. - Cessation counseling in the event patient recovers from this encounter, though unlikely  Protein-calorie malnutrition - Nutrition consulted, appreciate recs - TF initiated 11/15 - Monitor CBGs Q4H on TF  Best Practice: (right click and "Reselect all SmartList Selections" daily)   Diet/type: NPO DVT prophylaxis: prophylactic heparin  GI prophylaxis: PPI Lines: N/A Foley:  Yes, and it is still needed Code Status:  full code Last date of multidisciplinary goals of care discussion [Pending]  Critical care time: 45 minutes   Tim Lair, PA-C [facility] 01/31/21 7:58 AM  Please see [email or web address] for pager details.  From 7A-7P if no response, please call [phone] After hours, please call ELink [phone]

## 2021-01-31 NOTE — Progress Notes (Signed)
Initial Nutrition Assessment  DOCUMENTATION CODES:   Severe malnutrition in context of chronic illness  INTERVENTION:   Initiate tube feeds via OG tube: - Start Vital 1.5 @ 20 ml/hr and advance by 10 ml q 6 hours to goal rate of 55 ml/hr (1320 ml/day)  Tube feeding regimen at goal provides 1980 kcal, 89 grams of protein, and 1008 ml of H2O.   Monitor magnesium, potassium, and phosphorus BID for at least 3 days, MD to replete as needed, as pt is at risk for refeeding syndrome given severe malnutrition, polysubstance abuse.  - MVI with minerals daily per tube  NUTRITION DIAGNOSIS:   Severe Malnutrition related to chronic illness (COPD, CHF, polysubstance abuse) as evidenced by severe muscle depletion, severe fat depletion.  GOAL:   Patient will meet greater than or equal to 90% of their needs  MONITOR:   Vent status, Labs, Weight trends, TF tolerance  REASON FOR ASSESSMENT:   Ventilator, Consult Enteral/tube feeding initiation and management  ASSESSMENT:   50 year old male who presented to the ED on 11/14 after an unwitnessed cardiac arrest with suspected heroin overdose. Concern for hypoxic brain injury given prolonged downtime. PMH of polysubstance abuse including IVDU, COPD, CHF, hepatitis B, osteomyelitis. Pt admitted with shock, AKI, acute hypoxemic respiratory failure. Pt tested positive for influenza.  Discussed pt with RN and during ICU rounds. Pt unresponsive off all sedation. Per Neurology, early myoclonus as well as the initial suppression on EEG are concerning for poor prognosis. MRI did not demonstrate extensive injury, so Neurology recommending giving pt more time to see how he progresses. Consult received for tube feeding initiation and management. Pt with OGT in stomach per abdominal x-ray.  RD to start tube feeds at trickle rate and slowly advance to goal. Pt at risk for refeeding syndrome given severe malnutrition, polysubstance abuse. Refeeding labs  ordered.  Spoke with pt's family members at bedside who report pt ate "very little" due to problems with his teeth/mouth pain and substance abuse. They report that pt has lost weight over time. Reviewed weight history in chart. Pt with a 3.4 kg weight loss since 06/26/20. This is a 5.5% weight loss in 7 months which is not significant for that timeframe.  Patient is currently intubated on ventilator support MV: 13.1 L/min Temp (24hrs), Avg:96.4 F (35.8 C), Min:92.4 F (33.6 C), Max:98.4 F (36.9 C)  Medications reviewed and include: colace, tamiflu, protonix, miralax, IV abx  Labs reviewed: BUN 96, creatinine 4.58, ionized calcium 1.06, magnesium 3.0, platelets 78 CBG's: 78-197 x 24 hours  UOP: 112 ml x 12 hours I/O's: +1.8 L since admit  NUTRITION - FOCUSED PHYSICAL EXAM:  Flowsheet Row Most Recent Value  Orbital Region Severe depletion  Upper Arm Region Severe depletion  Thoracic and Lumbar Region Severe depletion  Buccal Region Unable to assess  Temple Region Severe depletion  Clavicle Bone Region Severe depletion  Clavicle and Acromion Bone Region Severe depletion  Scapular Bone Region Severe depletion  Dorsal Hand Severe depletion  Patellar Region Severe depletion  Anterior Thigh Region Severe depletion  Posterior Calf Region Severe depletion  Edema (RD Assessment) None  Hair Reviewed  Eyes Unable to assess  Mouth Unable to assess  Skin Reviewed  Nails Reviewed       Diet Order:   Diet Order     None       EDUCATION NEEDS:   Not appropriate for education at this time  Skin:  Skin Assessment: Reviewed RN Assessment  Last BM:  01/31/21 medium type 7  Height:   Ht Readings from Last 1 Encounters:  02/14/2021 5\' 9"  (1.753 m)    Weight:   Wt Readings from Last 1 Encounters:  2021-02-14 58.8 kg    BMI:  Body mass index is 19.14 kg/m.  Estimated Nutritional Needs:   Kcal:  1750-1950  Protein:  85-100 grams  Fluid:  1.7-1.9 L    02/01/21, MS, RD, LDN Inpatient Clinical Dietitian Please see AMiON for contact information.

## 2021-01-31 NOTE — Progress Notes (Signed)
Subjective: Continues to have occasional myoclonus  Exam: Vitals:   01/31/21 1200 01/31/21 1212  BP: (!) 159/106   Pulse: 85   Resp: (!) 34   Temp: (!) 97.2 F (36.2 C) (!) 97 F (36.1 C)  SpO2: 96%    Gen: In bed, NAD Resp: non-labored breathing, no acute distress Abd: soft, nt  Neuro: MS: Eyes open partially to noxious stimulation, does not follow commands, does not engage with examiner CN: Pupils equal round and reactive, corneals intact Motor: Extension to noxious stimulation in bilateral upper extremities, no movement in bilateral lowers Sensory: As above  Pertinent Labs: Creatinine 4.5, increased from 0.645 months ago  Impression: 50 year old male with likely anoxic brain injury after cardiac arrest.  Early myoclonus is well as the initial suppression on EEG are concerning for poor prognosis.  His MRI did not demonstrate extensive injury, and therefore I do think that giving him some more time to see how he progresses would be prudent.  I will continue Keppra for the myoclonus, though this appears to be subcortical and therefore benzodiazepines may be more beneficial if it becomes very problematic.  Recommendations: 1) continue Keppra 500 twice daily 2) could consider benzodiazepine such as clonazepam if myoclonus is problematic 3) I will continue EEG overnight tonight 4) neurology will continue to follow  This patient is critically ill and at significant risk of neurological worsening, death and care requires constant monitoring of vital signs, hemodynamics,respiratory and cardiac monitoring, neurological assessment, discussion with family, other specialists and medical decision making of high complexity. I spent 35 minutes of neurocritical care time  in the care of  this patient. This was time spent independent of any time provided by nurse practitioner or PA.  Ritta Slot, MD [facility] [phone]  If 7pm- 7am, please page neurology on call as  listed in AMION. 01/31/2021  12:53 PM

## 2021-01-31 NOTE — Progress Notes (Signed)
Patient transported from MRI to 3M12 without incidence.

## 2021-02-01 ENCOUNTER — Inpatient Hospital Stay (HOSPITAL_COMMUNITY): Payer: Medicaid Other

## 2021-02-01 DIAGNOSIS — I469 Cardiac arrest, cause unspecified: Secondary | ICD-10-CM | POA: Diagnosis not present

## 2021-02-01 DIAGNOSIS — G931 Anoxic brain damage, not elsewhere classified: Secondary | ICD-10-CM | POA: Diagnosis not present

## 2021-02-01 LAB — BASIC METABOLIC PANEL
Anion gap: 8 (ref 5–15)
Anion gap: 8 (ref 5–15)
BUN: 98 mg/dL — ABNORMAL HIGH (ref 6–20)
BUN: 98 mg/dL — ABNORMAL HIGH (ref 6–20)
CO2: 25 mmol/L (ref 22–32)
CO2: 26 mmol/L (ref 22–32)
Calcium: 8.1 mg/dL — ABNORMAL LOW (ref 8.9–10.3)
Calcium: 8 mg/dL — ABNORMAL LOW (ref 8.9–10.3)
Chloride: 109 mmol/L (ref 98–111)
Chloride: 107 mmol/L (ref 98–111)
Creatinine, Ser: 4.92 mg/dL — ABNORMAL HIGH (ref 0.61–1.24)
Creatinine, Ser: 5.04 mg/dL — ABNORMAL HIGH (ref 0.61–1.24)
GFR, Estimated: 13 mL/min — ABNORMAL LOW (ref >60)
GFR, Estimated: 13 mL/min — ABNORMAL LOW (ref >60)
Glucose, Bld: 121 mg/dL — ABNORMAL HIGH (ref 70–99)
Glucose, Bld: 216 mg/dL — ABNORMAL HIGH (ref 70–99)
Potassium: 6.2 mmol/L — ABNORMAL HIGH (ref 3.5–5.1)
Potassium: 5.5 mmol/L — ABNORMAL HIGH (ref 3.5–5.1)
Sodium: 142 mmol/L (ref 135–145)
Sodium: 141 mmol/L (ref 135–145)

## 2021-02-01 LAB — CBC WITH DIFFERENTIAL/PLATELET
Abs Immature Granulocytes: 0.03 10*3/uL (ref 0.00–0.07)
Basophils Absolute: 0 10*3/uL (ref 0.0–0.1)
Basophils Relative: 0 %
Eosinophils Absolute: 0 10*3/uL (ref 0.0–0.5)
Eosinophils Relative: 0 %
HCT: 40 % (ref 39.0–52.0)
Hemoglobin: 11.3 g/dL — ABNORMAL LOW (ref 13.0–17.0)
Immature Granulocytes: 1 %
Lymphocytes Relative: 9 %
Lymphs Abs: 0.5 10*3/uL — ABNORMAL LOW (ref 0.7–4.0)
MCH: 22.6 pg — ABNORMAL LOW (ref 26.0–34.0)
MCHC: 28.3 g/dL — ABNORMAL LOW (ref 30.0–36.0)
MCV: 80.2 fL (ref 80.0–100.0)
Monocytes Absolute: 0.3 10*3/uL (ref 0.1–1.0)
Monocytes Relative: 6 %
Neutro Abs: 4.3 10*3/uL (ref 1.7–7.7)
Neutrophils Relative %: 84 %
Platelets: 66 10*3/uL — ABNORMAL LOW (ref 150–400)
RBC: 4.99 MIL/uL (ref 4.22–5.81)
RDW: 20.5 % — ABNORMAL HIGH (ref 11.5–15.5)
WBC: 5.1 10*3/uL (ref 4.0–10.5)
nRBC: 0 % (ref 0.0–0.2)

## 2021-02-01 LAB — POTASSIUM
Potassium: 5.7 mmol/L — ABNORMAL HIGH (ref 3.5–5.1)
Potassium: 5.6 mmol/L — ABNORMAL HIGH (ref 3.5–5.1)
Potassium: 4.7 mmol/L (ref 3.5–5.1)

## 2021-02-01 LAB — GLUCOSE, CAPILLARY
Glucose-Capillary: 115 mg/dL — ABNORMAL HIGH (ref 70–99)
Glucose-Capillary: 91 mg/dL (ref 70–99)
Glucose-Capillary: 109 mg/dL — ABNORMAL HIGH (ref 70–99)
Glucose-Capillary: 122 mg/dL — ABNORMAL HIGH (ref 70–99)
Glucose-Capillary: 110 mg/dL — ABNORMAL HIGH (ref 70–99)
Glucose-Capillary: 102 mg/dL — ABNORMAL HIGH (ref 70–99)

## 2021-02-01 LAB — RENAL FUNCTION PANEL
Albumin: 2.4 g/dL — ABNORMAL LOW (ref 3.5–5.0)
Anion gap: 9 (ref 5–15)
BUN: 89 mg/dL — ABNORMAL HIGH (ref 6–20)
CO2: 24 mmol/L (ref 22–32)
Calcium: 7.8 mg/dL — ABNORMAL LOW (ref 8.9–10.3)
Chloride: 109 mmol/L (ref 98–111)
Creatinine, Ser: 4.25 mg/dL — ABNORMAL HIGH (ref 0.61–1.24)
GFR, Estimated: 16 mL/min — ABNORMAL LOW (ref >60)
Glucose, Bld: 120 mg/dL — ABNORMAL HIGH (ref 70–99)
Phosphorus: 5.1 mg/dL — ABNORMAL HIGH (ref 2.5–4.6)
Potassium: 5.8 mmol/L — ABNORMAL HIGH (ref 3.5–5.1)
Sodium: 142 mmol/L (ref 135–145)

## 2021-02-01 LAB — MAGNESIUM
Magnesium: 2.8 mg/dL — ABNORMAL HIGH (ref 1.7–2.4)
Magnesium: 2.8 mg/dL — ABNORMAL HIGH (ref 1.7–2.4)

## 2021-02-01 LAB — PHOSPHORUS
Phosphorus: 5.8 mg/dL — ABNORMAL HIGH (ref 2.5–4.6)
Phosphorus: 5.1 mg/dL — ABNORMAL HIGH (ref 2.5–4.6)

## 2021-02-01 LAB — CK: Total CK: 43 U/L — ABNORMAL LOW (ref 49–397)

## 2021-02-01 MED ORDER — PROSOURCE TF PO LIQD
45.0000 mL | Freq: Two times a day (BID) | ORAL | Status: DC
  Administered 2021-02-01: 45 mL
  Filled 2021-02-01: qty 45

## 2021-02-01 MED ORDER — CLONAZEPAM 0.1 MG/ML ORAL SUSPENSION: 0.2500 mg | Freq: Three times a day (TID) | ORAL | Status: DC | PRN

## 2021-02-01 MED ORDER — CALCIUM GLUCONATE-NACL 1-0.675 GM/50ML-% IV SOLN
1.0000 g | Freq: Once | INTRAVENOUS | Status: AC
  Administered 2021-02-01: 1000 mg via INTRAVENOUS
  Filled 2021-02-01: qty 50

## 2021-02-01 MED ORDER — DOCUSATE SODIUM 50 MG/5ML PO LIQD: 100.0000 mg | Freq: Two times a day (BID) | ORAL | Status: DC | PRN

## 2021-02-01 MED ORDER — PRISMASOL BGK 4/2.5 32-4-2.5 MEQ/L REPLACEMENT SOLN
Status: DC
  Filled 2021-02-01 (×4): qty 5000

## 2021-02-01 MED ORDER — SODIUM CHLORIDE 0.9% FLUSH
10.0000 mL | Freq: Two times a day (BID) | INTRAVENOUS | Status: DC
  Administered 2021-02-02 (×3): 10 mL

## 2021-02-01 MED ORDER — LACTULOSE ENEMA
300.0000 mL | Freq: Once | ORAL | Status: DC
  Filled 2021-02-01: qty 300

## 2021-02-01 MED ORDER — HEPARIN SODIUM (PORCINE) 1000 UNIT/ML DIALYSIS
1000.0000 [IU] | INTRAMUSCULAR | Status: DC | PRN
  Administered 2021-02-02: 12:00:00 2800 [IU] via INTRAVENOUS_CENTRAL
  Filled 2021-02-01: qty 6
  Filled 2021-02-01: qty 3
  Filled 2021-02-01: qty 6

## 2021-02-01 MED ORDER — POLYETHYLENE GLYCOL 3350 17 G PO PACK: 17.0000 g | PACK | Freq: Every day | ORAL | Status: DC | PRN

## 2021-02-01 MED ORDER — LORAZEPAM 2 MG/ML IJ SOLN
0.5000 mg | Freq: Once | INTRAMUSCULAR | Status: AC
  Administered 2021-02-01: 0.5 mg via INTRAVENOUS
  Filled 2021-02-01: qty 1

## 2021-02-01 MED ORDER — HEPARIN (PORCINE) 2000 UNITS/L FOR CRRT: INTRAVENOUS_CENTRAL | Status: DC | PRN

## 2021-02-01 MED ORDER — SODIUM CHLORIDE 0.9% FLUSH: 10.0000 mL | INTRAVENOUS | Status: DC | PRN

## 2021-02-01 MED ORDER — PRISMASOL BGK 4/2.5 32-4-2.5 MEQ/L REPLACEMENT SOLN
Status: DC
  Filled 2021-02-01 (×3): qty 5000

## 2021-02-01 MED ORDER — PROPOFOL 1000 MG/100ML IV EMUL
5.0000 ug/kg/min | INTRAVENOUS | Status: DC
  Administered 2021-02-01: 40.128 ug/kg/min via INTRAVENOUS
  Administered 2021-02-01 – 2021-02-02 (×2): 10 ug/kg/min via INTRAVENOUS
  Administered 2021-02-02 (×2): 40 ug/kg/min via INTRAVENOUS
  Administered 2021-02-03 (×2): 30 ug/kg/min via INTRAVENOUS
  Filled 2021-02-01 (×7): qty 100

## 2021-02-01 MED ORDER — INSULIN ASPART 100 UNIT/ML IV SOLN
3.0000 [IU] | Freq: Once | INTRAVENOUS | Status: AC
  Administered 2021-02-01: 3 [IU] via INTRAVENOUS

## 2021-02-01 MED ORDER — SODIUM ZIRCONIUM CYCLOSILICATE 10 G PO PACK
10.0000 g | PACK | Freq: Three times a day (TID) | ORAL | Status: AC
  Administered 2021-02-01 (×3): 10 g
  Filled 2021-02-01 (×3): qty 1

## 2021-02-01 MED ORDER — CHLORHEXIDINE GLUCONATE CLOTH 2 % EX PADS
6.0000 | MEDICATED_PAD | Freq: Every day | CUTANEOUS | Status: DC
  Administered 2021-02-02: 09:00:00 6 via TOPICAL

## 2021-02-01 MED ORDER — DEXTROSE 50 % IV SOLN
1.0000 | Freq: Once | INTRAVENOUS | Status: AC
  Administered 2021-02-01: 50 mL via INTRAVENOUS
  Filled 2021-02-01: qty 50

## 2021-02-01 MED ORDER — PRISMASOL BGK 4/2.5 32-4-2.5 MEQ/L EC SOLN
Status: DC
  Filled 2021-02-01 (×12): qty 5000

## 2021-02-01 MED ORDER — PROSOURCE TF PO LIQD
45.0000 mL | Freq: Three times a day (TID) | ORAL | Status: DC
  Administered 2021-02-01 – 2021-02-03 (×7): 45 mL
  Filled 2021-02-01 (×7): qty 45

## 2021-02-01 MED ORDER — B COMPLEX-C PO TABS
1.0000 | ORAL_TABLET | Freq: Every day | ORAL | Status: DC
  Administered 2021-02-02 – 2021-02-03 (×2): 1
  Filled 2021-02-01 (×2): qty 1

## 2021-02-01 MED ORDER — VITAL HIGH PROTEIN PO LIQD: 1000.0000 mL | ORAL | Status: DC

## 2021-02-01 MED ORDER — FENTANYL CITRATE (PF) 100 MCG/2ML IJ SOLN
50.0000 ug | INTRAMUSCULAR | Status: DC | PRN
  Administered 2021-02-01: 50 ug via INTRAVENOUS
  Administered 2021-02-01 – 2021-02-03 (×8): 100 ug via INTRAVENOUS
  Filled 2021-02-01 (×9): qty 2

## 2021-02-01 NOTE — Progress Notes (Signed)
eLink Physician-Brief Progress Note Patient Name: David Short DOB: [birth year] MRN: [id number]   Date of Service  02/01/2021  HPI/Events of Note  K+  6.2  eICU Interventions  Modified  E-Link hyperkalemia treatment orders entered with 3 units of Insulin iv.        [name] [name] 02/01/2021, 6:25 AM

## 2021-02-01 NOTE — Progress Notes (Signed)
Subjective: Continues to have occasional myoclonus, yesterday I saw generalized myoclonus, today looks more like polymyoclonus.   Exam: Vitals:   02/01/21 0800 02/01/21 0900  BP: (!) 157/104 (!) 157/101  Pulse: 91 86  Resp: (!) 36 (!) 32  Temp: 99 F (37.2 C) 99.1 F (37.3 C)  SpO2: 92% 92%   Gen: In bed, NAD Resp: non-labored breathing, no acute distress Abd: soft, nt  Neuro: MS: Eyes open partially to noxious stimulation, does not follow commands, does not engage with examiner CN: Pupils equal round and reactive, corneals intact Motor: Extension to noxious stimulation in bilateral upper extremities, but also with some more complex movvements that look like they might be cortically driven, does not localize.  no movement in left lower, minimal flexion right lower.  Sensory: As above  Pertinent Labs: BUN 98  Impression: 50 year old male with likely anoxic brain injury after cardiac arrest.  Early myoclonus is well as the initial suppression on EEG are concerning for poor prognosis.  His MRI did not demonstrate extensive injury, and therefore I do think that giving him some more time to see how he progresses would be prudent.  I will continue Keppra for the myoclonus, though this appears to be subcortical and therefore benzodiazepines may be more beneficial if it becomes very problematic.  Recommendations: 1) continue Keppra 500 twice daily 2) continue clonazepam PRN myoclonus 3) d/c EEG 4) likely will repeat MRI tomorrow  This patient is critically ill and at significant risk of neurological worsening, death and care requires constant monitoring of vital signs, hemodynamics,respiratory and cardiac monitoring, neurological assessment, discussion with family, other specialists and medical decision making of high complexity. I spent 35 minutes of neurocritical care time  in the care of  this patient. This was time spent independent of any time provided by nurse practitioner or  PA.  Ritta Slot, MD [facility] [phone]  If 7pm- 7am, please page neurology on call as listed in AMION. 02/01/2021  9:56 AM

## 2021-02-01 NOTE — Progress Notes (Signed)
NAME:  David Short, MRN:  [id number], DOB:  [birth year], LOS: 2 ADMISSION DATE:  January 31, 2021 CONSULTATION DATE:  Jan 31, 2021 REFERRING MD:  Jeraldine Loots - EDP CHIEF COMPLAINT:  Post-arrest, possible overdose   History of Present Illness:  50 year old man who presented to [facility] 11/14 via EMS after unwitnessed arrest with suspected heroin overdose (used 2 hours prior). Noted to be in PEA on EMS arrival; CPR initiated and Epi x 2 given with ROSC. Epi gtt initiated. Concern for hypoxic brian injury given prolonged downtime. PMHx significant for CHF (Echo 06/2020 EF 65-70%, severe RV enlargement with elevated PASP), COPD (noncompliant with HOT), polysubstance abuse (IVDU, tobacco, cocaine), Hepatitis B (on Vemlidy), osteomyelitis, IDA and thrombocytopenia. Previously arrested requiring CPR ~06/2020.  On ED arrival, patient was unresponsive with [facility] airway in place copious blood from his nasopharynx. He was subsequently intubated and Levophed was initiated. Hypothermic, Bair hugger placed. ABG 7.148/54.5/560/20.5. Labs notable for WBC 13.4, hyperkalemia (7.9), AKI with Cr 4.67 (baseline 0.5-1), mild transaminitis, lipase 64, lactic acidosis (LA > 9), BNP >1400, . UDS +cocaine. UA dirty with +nitrite. Respiratory virus panel +Flu A. EKG demonstrated no significant changes from prior. CXR with evidence of COPD, no acute process. CT Head showed diffuse cerebral edema c/f hypoxic brain injury.  PCCM consulted for admission and further management.  Pertinent Medical History:   Past Medical History:  Diagnosis Date   CHF (congestive heart failure) (HCC)    Chronic back pain    COPD (chronic obstructive pulmonary disease) (HCC)    IDA (iron deficiency anemia)    IVDU (intravenous drug user)    heroin    On home oxygen therapy    Osteomyelitis (HCC)    Splenomegaly    Thrombocytopenia (HCC)    Tobacco use    Significant Hospital Events: Including procedures, antibiotic start and stop dates in addition to  other pertinent events   11/14 - Presented to ED via EMS post-arrest, ?2 hours downtime post-heroin use. CT Head with cerebral edema and c/f hypoxic ischemic injury. LA > 9, ARF with K 7.9 (shifted), Cr > 4 (baseline < 1). 11/15 - Occasional myoclonus. Ongoing LTM EEG without seizure activity. Neuro exam slightly improved from prior, but remains poor even off of sedation. MRI brain without significant abnormality. Neuro following.  Interim History / Subjective:  Mother at bedside Intermittent myoclonus but also some localizing signs to pain for me He has cranial reflexes and intermittent myclonic jerking.  Objective:  Blood pressure (!) 144/103, pulse 82, temperature 99.1 F (37.3 C), resp. rate (!) 24, height 5\' 9"  (1.753 m), weight 62.3 kg, SpO2 93 %.    Vent Mode: PRVC FiO2 (%):  [40 %] 40 % Set Rate:  [24 bmp] 24 bmp Vt Set:  [560 mL] 560 mL PEEP:  [5 cmH20] 5 cmH20 Plateau Pressure:  [17 cmH20-26 cmH20] 17 cmH20   Intake/Output Summary (Last 24 hours) at 02/01/2021 0656 Last data filed at 02/01/2021 0415 Gross per 24 hour  Intake 1794.91 ml  Output 460 ml  Net 1334.91 ml    Filed Weights   Jan 31, 2021 2000 02/01/21 0500  Weight: 58.8 kg 62.3 kg   Physical Examination: Chronically ill appearing man on vent Lungs with rhonci, ext warm to tough Pupils small but reactive Has Doll's eyes, corneals, cough, gag Seems to move head to side of noxious stimuli  Worsening renal failure CBG okay Plts remain low  Resolved Hospital Problem List:    Assessment & Plan:  PEA arrest, likely in the  setting of drug overdose Post arrest encephalopathy- MRI okay, EEG ongoing, too early for prognostication Post arrest shock- resolved Hx of polysubstance abuse Acute hypoxemic respiratory failure COPD, noncompliant Flu positive Equivocal UA/UTI Post arrest ATN vs. Rhabdo with hyperkalemia Hepatitis B- on vemlidy Polysubstance abuse Severe protein-calorie malnutrition, POA  -  Continue vent support - Discussed HD with mother to buy [place] time to prognosticate, she is okay with this, will consult renal - Continue CTX  x 5 days - Continue tamiflu - Temporizing insulin/dextrose, lokelma for hyper-K - Check CK, consider aggressive hydration - Appreciate continued neuro input  Best Practice: (right click and "Reselect all SmartList Selections" daily)   Diet/type: TF DVT prophylaxis: prophylactic heparin  GI prophylaxis: PPI Lines: N/A Foley:  Yes, and it is still needed Code Status:  full code Last date of multidisciplinary goals of care discussion [11/16 with mother, give more time for prognostication]  Critical care time: 45 minutes   Lorin Glass, MD [facility] 02/01/21 6:56 AM  Please see [email or web address] for pager details.  From 7A-7P if no response, please call [phone] After hours, please call ELink [phone]

## 2021-02-01 NOTE — Progress Notes (Signed)
eLink Physician-Brief Progress Note Patient Name: David Short DOB: [birth year] MRN: [id number]   Date of Service  02/01/2021  HPI/Events of Note  Patient with increased myoclonic jerking, agitation, and ventilator dyssynchrony.  eICU Interventions  Ativan 0.5 mg iv x 1 ordered, followed by Klonopin 0.25 mg via NG tube Q  8 hours PRN myoclonic jerking.        [name] Mithra Spano 02/01/2021, 2:46 AM

## 2021-02-01 NOTE — Progress Notes (Signed)
LTM EEG discontinued - slight skin breakdown at ground electrode site per EEG. [place] Nurse

## 2021-02-01 NOTE — Procedures (Signed)
Central Venous Catheter Insertion Procedure Note  Mat Stuard  [id number]  23-Jul-1970  Date:02/01/21  Time:12:28 PM   Provider Performing:Clariece Roesler August Saucer   Procedure: Insertion of Non-tunneled Central Venous Catheter(36556)with US guidance (16967)    Indication(s) Hemodialysis  Consent Risks of the procedure as well as the alternatives and risks of each were explained to the patient and/or caregiver.  Consent for the procedure was obtained and is signed in the bedside chart  Anesthesia Topical only with 1% lidocaine   Timeout Verified patient identification, verified procedure, site/side was marked, verified correct patient position, special equipment/implants available, medications/allergies/relevant history reviewed, required imaging and test results available.  Sterile Technique Maximal sterile technique including full sterile barrier drape, hand hygiene, sterile gown, sterile gloves, mask, hair covering, sterile ultrasound probe cover (if used).  Procedure Description Area of catheter insertion was cleaned with chlorhexidine and draped in sterile fashion.   With real-time ultrasound guidance a HD catheter was placed into the left internal jugular vein.  Nonpulsatile blood flow and easy flushing noted in all ports.  The catheter was sutured in place and sterile dressing applied.  Complications/Tolerance None; patient tolerated the procedure well. Chest X-ray is ordered to verify placement for internal jugular or subclavian cannulation.  Chest x-ray is not ordered for femoral cannulation.  EBL Minimal  Specimen(s) None

## 2021-02-01 NOTE — Consult Note (Signed)
Reason for Consult: AKI Referring Physician: Tamala Julian, MD  David Short is an 50 y.o. male has a PMH significant for CHF, COPD, hepatitis B (on vemlidy), osteomyelitis, and polysubstance abuse (IVDU, cocaine) who was found down after an unwitnessed arrest (suspected heroin overdose) and noted to be in PEA.  CPR initiated and epi given with ROSC.  He was intubated and started on levophed and remained unresponsive.  Labs notable for WBC 13.4, K 7.9, BUN 80, Cr 4.67, BNP >1400, UDS + cocaine, Lactate >9.  Resp panel + Influenza A, negative covid-19.  CT of head showed diffuse cerebral edema c/w hypoxic brain injury, however MRI did not demonstrate extensive injury.  We were consulted to provide dialysis to see if his mental status would improve vs confirm anoxic brain injury.  The trend in Scr is seen below.  He has been oliguric since admission.  Trend in Creatinine: Creatinine, Ser  Date/Time Value Ref Range Status  02/01/2021 07:05 AM 5.04 (H) 0.61 - 1.24 mg/dL Final  02/01/2021 04:00 AM 4.92 (H) 0.61 - 1.24 mg/dL Final  01/31/2021 04:03 AM 4.58 (H) 0.61 - 1.24 mg/dL Final  01/29/2021 08:51 PM 4.21 (H) 0.61 - 1.24 mg/dL Final  01/20/2021 11:00 AM 4.67 (H) 0.61 - 1.24 mg/dL Final  08/22/2020 12:00 AM 0.64 (L) 0.70 - 1.33 mg/dL Final  08/01/2020 12:00 AM 0.57 (L) 0.70 - 1.33 mg/dL Final  07/22/2020 12:00 AM 1.90 (H) 0.70 - 1.33 mg/dL Final  07/17/2020 05:22 AM 0.93 0.61 - 1.24 mg/dL Final  07/16/2020 10:40 AM 0.79 0.61 - 1.24 mg/dL Final  07/16/2020 02:53 AM 0.97 0.61 - 1.24 mg/dL Final  07/16/2020 12:47 AM 1.03 0.61 - 1.24 mg/dL Final  07/15/2020 05:44 PM 1.28 (H) 0.61 - 1.24 mg/dL Final  06/26/2020 01:50 AM 1.00 0.61 - 1.24 mg/dL Final  06/25/2020 03:18 AM 1.34 (H) 0.61 - 1.24 mg/dL Final  06/24/2020 01:41 AM 1.16 0.61 - 1.24 mg/dL Final  06/23/2020 04:38 AM 1.10 0.61 - 1.24 mg/dL Final  06/22/2020 04:07 AM 0.98 0.61 - 1.24 mg/dL Final  06/21/2020 04:16 AM 1.02 0.61 - 1.24 mg/dL Final   06/20/2020 06:45 AM 1.23 0.61 - 1.24 mg/dL Final  06/19/2020 01:08 AM 1.52 (H) 0.61 - 1.24 mg/dL Final  06/18/2020 09:40 AM 1.61 (H) 0.61 - 1.24 mg/dL Final  06/18/2020 06:42 AM 1.57 (H) 0.61 - 1.24 mg/dL Final  06/17/2020 07:48 PM 1.68 (H) 0.61 - 1.24 mg/dL Final  12/30/2018 06:02 AM 0.60 (L) 0.61 - 1.24 mg/dL Final  12/27/2018 05:31 AM 0.55 (L) 0.61 - 1.24 mg/dL Final  12/24/2018 03:52 AM 0.60 (L) 0.61 - 1.24 mg/dL Final  12/20/2018 07:45 AM 0.65 0.61 - 1.24 mg/dL Final  12/16/2018 07:34 AM 0.61 0.61 - 1.24 mg/dL Final  12/14/2018 08:08 AM 0.65 0.61 - 1.24 mg/dL Final  12/13/2018 05:22 AM 0.45 (L) 0.61 - 1.24 mg/dL Final  12/12/2018 09:20 AM 0.54 (L) 0.61 - 1.24 mg/dL Final  08/19/2016 12:40 AM 1.21 0.61 - 1.24 mg/dL Final  03/17/2012 05:20 AM 0.90 0.50 - 1.35 mg/dL Final  03/01/2012 05:09 PM 0.98 0.50 - 1.35 mg/dL Final  02/10/2012 06:00 PM 0.80 0.50 - 1.35 mg/dL Final  12/31/2010 08:25 PM 0.86 0.50 - 1.35 mg/dL Final    PMH:   Past Medical History:  Diagnosis Date   CHF (congestive heart failure) (HCC)    Chronic back pain    COPD (chronic obstructive pulmonary disease) (HCC)    IDA (iron deficiency anemia)    IVDU (intravenous drug user)  heroin    On home oxygen therapy    Osteomyelitis (HCC)    Splenomegaly    Thrombocytopenia (HCC)    Tobacco use     PSH:   Past Surgical History:  Procedure Laterality Date   ANTERIOR CERVICAL DECOMP/DISCECTOMY FUSION N/A 12/14/2018   Procedure: ANTERIOR CERVICAL DISCECTOMY FUSION CERVICAL FIVE- CERVICAL SIX;  Surgeon: Eustace Moore, MD;  Location: [facility];  Service: Neurosurgery;  Laterality: N/A;   BACK SURGERY     states never had surgery   FRACTURE SURGERY Left    elbow/wrist/ankle    Allergies:  Allergies  Allergen Reactions   Tylenol [Acetaminophen] Hives    Medications:   Prior to Admission medications   Medication Sig Start Date End Date Taking? Authorizing Provider  albuterol (VENTOLIN HFA) 108 (90 Base)  MCG/ACT inhaler Inhale 2 puffs into the lungs every 6 (six) hours as needed for wheezing or shortness of breath. Patient taking differently: Inhale 2 puffs into the lungs every 4 (four) hours as needed for wheezing or shortness of breath. 10/11/20  Yes Elsie Stain, MD  ALEVE 220 MG tablet Take 220-440 mg by mouth 2 (two) times daily as needed (for pain).   Yes [provider]  budesonide-formoterol (SYMBICORT) 160-4.5 MCG/ACT inhaler Inhale 2 puffs into the lungs in the morning and at bedtime. 10/11/20  Yes Elsie Stain, MD  carvedilol (COREG) 3.125 MG tablet Take 3.125 mg by mouth 2 (two) times daily with a meal.   Yes [provider]  ferrous sulfate 325 (65 FE) MG EC tablet Take 1 tablet (325 mg total) by mouth daily with breakfast. 08/02/20  Yes Dede Query T, PA-C  furosemide (LASIX) 20 MG tablet Take 1 tablet (20 mg total) by mouth daily. 10/11/20  Yes Elsie Stain, MD  nicotine (NICODERM CQ - DOSED IN MG/24 HOURS) 21 mg/24hr patch Place 1 patch (21 mg total) onto the skin daily. 10/11/20  Yes Elsie Stain, MD  OXYGEN Inhale 2-3 L/min into the lungs as needed (for shortness of breath).   Yes [provider]  pantoprazole (PROTONIX) 40 MG tablet Take 1 tablet (40 mg total) by mouth daily. 06/26/20  Yes Rai, Ripudeep K, MD  Tenofovir Alafenamide Fumarate (VEMLIDY) 25 MG TABS Take 1 tablet (25 mg total) by mouth daily. 09/12/20  Yes Kuppelweiser, Cassie L, RPH-CPP  azithromycin (ZITHROMAX) 250 MG tablet Take two once as 1 dose, then one daily until gone Patient not taking: No sig reported 10/11/20   Elsie Stain, MD  Buprenorphine HCl-Naloxone HCl 8-2 MG FILM Place 2 Film under the tongue daily. Patient not taking: No sig reported    [provider]  predniSONE (DELTASONE) 10 MG tablet Take 4 tablets daily for 5 days then stop Patient not taking: No sig reported 10/11/20   Elsie Stain, MD    Inpatient medications:  chlorhexidine  gluconate (MEDLINE KIT)  15 mL Mouth Rinse BID   Chlorhexidine Gluconate Cloth  6 each Topical Q0600   docusate  100 mg Per Tube BID   feeding supplement (PROSource TF)  45 mL Per Tube BID   feeding supplement (VITAL HIGH PROTEIN)  1,000 mL Per Tube Q24H   heparin  5,000 Units Subcutaneous Q8H   mouth rinse  15 mL Mouth Rinse 10 times per day   multivitamin with minerals  1 tablet Per Tube Daily   mupirocin ointment  1 application Nasal BID   oseltamivir  30 mg Per Tube QHS  pantoprazole sodium  40 mg Per Tube Daily   polyethylene glycol  17 g Per Tube Daily   sodium zirconium cyclosilicate  10 g Per Tube TID    Discontinued Meds:   Medications Discontinued During This Encounter  Medication Reason   fentaNYL (SUBLIMAZE) injection 100 mcg    norepinephrine (LEVOPHED) 46m in 253mpremix infusion    furosemide (LASIX) injection 40 mg    acetaminophen (TYLENOL) tablet 650 mg Allergic reaction   acetaminophen (TYLENOL) 160 MG/5ML solution 650 mg Allergic reaction   acetaminophen (TYLENOL) suppository 650 mg Allergic reaction   etomidate (AMIDATE) injection Discontinued by provider   rocuronium (ZEMURON) injection Discontinued by provider   fentaNYL (SUBLIMAZE) injection 100 mcg Discontinued by provider   midazolam (VERSED) injection 2 mg Discontinued by provider   midazolam (VERSED) injection 2 mg Discontinued by provider   propofol (DIPRIVAN) 1000 MG/100ML infusion Discontinued by provider   pantoprazole (PROTONIX) injection 40 mg    oseltamivir (TAMIFLU) capsule 30 mg    levETIRAcetam (KEPPRA) IVPB 1000 mg/100 mL premix    lactulose (CHRONULAC) enema 200 gm    norepinephrine (LEVOPHED) 48m22mn 250m53memix infusion    busPIRone (BUSPAR) tablet 30 mg    busPIRone (BUSPAR) tablet 30 mg     Social History:  reports that he has been smoking cigarettes. He has been smoking an average of 1 pack per day. He has never used smokeless tobacco. He reports that he does not currently use  alcohol. He reports that he does not currently use drugs after having used the following drugs: IV and Heroin.  Family History:   Family History  Problem Relation Age of Onset   Hypertension Sister    Hypertension Brother    Colon cancer Maternal Grandmother    Prostate cancer Maternal Grandfather    Colon cancer Maternal Grandfather     Review of systems not obtained due to patient factors. Weight change: 3.514 kg  Intake/Output Summary (Last 24 hours) at 02/01/2021 1042 Last data filed at 02/01/2021 0900 Gross per 24 hour  Intake 2114.18 ml  Output 427 ml  Net 1687.18 ml   BP (!) 157/101   Pulse 86   Temp 99.1 F (37.3 C)   Resp (!) 32   Ht '5\' 9"'  (1.753 m)   Wt 62.3 kg   SpO2 92%   BMI 20.28 kg/m  Vitals:   02/01/21 0746 02/01/21 0752 02/01/21 0800 02/01/21 0900  BP:   (!) 157/104 (!) 157/101  Pulse:   91 86  Resp:  (!) 24 (!) 36 (!) 32  Temp: 98.8 F (37.1 C)  99 F (37.2 C) 99.1 F (37.3 C)  TempSrc: Bladder     SpO2:  94% 92% 92%  Weight:      Height:         General appearance: Intubated and unresponsive Head: Normocephalic, without obvious abnormality, atraumatic Resp: clear to auscultation bilaterally Cardio: regular rate and rhythm, S1, S2 normal, no murmur, click, rub or gallop GI: soft, non-tender; bowel sounds normal; no masses,  no organomegaly Extremities: extremities normal, atraumatic, no cyanosis or edema  Labs: Basic Metabolic Panel: Recent Labs  Lab 01/24/2021 1100 02/14/2021 1213 02/10/2021 1517 01/28/2021 2051 01/31/21 0403 01/31/21 1617 02/01/21 0400 02/01/21 0705  NA 141 138 142 143 139  --  142 141  K 7.2* 7.9* 5.7* 4.7 4.9  --  6.2* 5.5*  CL 106  --   --  105 104  --  109 107  CO2  20*  --   --  27 21*  --  25 26  GLUCOSE 52*  --   --  146* 94  --  121* 216*  BUN 80*  --   --  91* 96*  --  98* 98*  CREATININE 4.67*  --   --  4.21* 4.58*  --  4.92* 5.04*  ALBUMIN 3.0*  --   --   --   --   --   --   --   CALCIUM 8.7*  --   --   7.7* 7.9*  --  8.1* 8.0*  PHOS  --   --   --   --   --  5.5* 5.8*  --    Liver Function Tests: Recent Labs  Lab 02/10/2021 1100  AST 196*  ALT 86*  ALKPHOS 108  BILITOT 2.0*  PROT 8.4*  ALBUMIN 3.0*   Recent Labs  Lab 02/11/2021 1100  LIPASE 64*   No results for input(s): AMMONIA in the last 168 hours. CBC: Recent Labs  Lab 01/27/2021 1100 02/15/2021 1213 01/31/2021 1355 02/14/2021 1517 02/10/2021 2051 01/31/21 0403 02/01/21 0400  WBC 13.4*  --  7.5  --  11.1* 7.1 5.1  NEUTROABS 10.7*  --   --   --  9.4*  --  4.3  HGB 13.5   < > 12.0* 15.0 13.3 12.6* 11.3*  HCT 53.1*   < > 45.1 44.0 47.1 45.4 40.0  MCV 91.9  --  85.7  --  81.3 80.6 80.2  PLT PLATELET CLUMPS NOTED ON SMEAR, UNABLE TO ESTIMATE  --  66*  --  94* 78* 66*   < > = values in this interval not displayed.   PT/INR: '@LABRCNTIP' (inr:5) Cardiac Enzymes: )No results for input(s): CKTOTAL, CKMB, CKMBINDEX, TROPONINI in the last 168 hours. CBG: Recent Labs  Lab 01/31/21 1554 01/31/21 1950 01/31/21 2331 02/01/21 0340 02/01/21 0744  GLUCAP 75 93 98 115* 91    Iron Studies: No results for input(s): IRON, TIBC, TRANSFERRIN, FERRITIN in the last 168 hours.  Xrays/Other Studies: CT Head Wo Contrast  Result Date: 01/23/2021 CLINICAL DATA:  Anoxic brain damage, post arrest EXAM: CT HEAD WITHOUT CONTRAST TECHNIQUE: Contiguous axial images were obtained from the base of the skull through the vertex without intravenous contrast. COMPARISON:  06/17/2020 FINDINGS: Brain: Slight narrowing of the sulci and sylvian fissures, which could indicate edema. Possible increased relative density of the cerebellum. No hypodensity to suggest acute infarct. No mass, mass effect, or midline shift. No acute hemorrhage. No extra-axial collection or hydrocephalus. Vascular: No hyperdense vessel. Skull: No acute osseous abnormality. Sinuses/Orbits: Partial opacification of the ethmoid air cells, with layering fluid in the maxillary and sphenoid  sinuses. Other: The mastoids are well aerated. IMPRESSION: Findings suggestive of cerebral edema, with slight narrowing of the sulci and sylvian fissures and possible increased relative density of the cerebellum, as can be seen with hypoxic ischemic injury. An MRI is recommended for further evaluation. Electronically Signed   By: Merilyn Baba M.D.   On: 02/08/2021 12:04   MR BRAIN WO CONTRAST  Result Date: 01/31/2021 CLINICAL DATA:  Initial evaluation for stroke suspected. Cardiac arrest. EXAM: MRI HEAD WITHOUT CONTRAST TECHNIQUE: Multiplanar, multiecho pulse sequences of the brain and surrounding structures were obtained without intravenous contrast. COMPARISON:  Prior CT from 01/29/2021. FINDINGS: Brain: Cerebral volume within normal limits. No significant cerebral white matter disease for age. No abnormal foci of restricted diffusion to suggest acute or subacute ischemia. Gray-white  matter differentiation maintained. No MRI findings of acute anoxia or hypoxic ischemic injury at this time. No acute intracranial hemorrhage. Scattered susceptibility artifact seen involving the cerebellar hemispheres bilaterally (series 14, image 18). Associated punctate T1 hyperintensity present at these locations (series 9, images 18, 10). Few additional scattered punctate microhemorrhages noted elsewhere within the brain. Findings are nonspecific, but suggestive of prior hemorrhage or other insult. This is new as compared to previous MRI from 2020. No mass lesion, midline shift or mass effect. No hydrocephalus or extra-axial fluid collection. Pituitary gland and suprasellar region within normal limits. Vascular: Major intracranial vascular flow voids are maintained. Skull and upper cervical spine: Craniocervical junction within normal limits. Diffusely decreased T1 signal intensity seen within the visualized bone marrow. No focal marrow replacing lesion. No scalp soft tissue abnormality. Sinuses/Orbits: Globes and orbital  soft tissues demonstrate no acute finding. Scattered mucosal thickening with air-fluid levels noted throughout the paranasal sinuses. Patient is intubated. Trace left mastoid effusion. Other: None. IMPRESSION: 1. No evidence for anoxic injury or other acute intracranial abnormality. 2. Scattered susceptibility artifact involving the cerebellar hemispheres bilaterally, consistent with prior hemorrhage. This could be related to prior ischemia or other insult. Few additional scattered punctate microhemorrhages seen elsewhere throughout the brain. Findings are new as compared to 2020. Electronically Signed   By: Jeannine Boga M.D.   On: 01/31/2021 04:11   DG Chest Port 1 View  Result Date: 02/15/2021 CLINICAL DATA:  Altered level of consciousness. EXAM: PORTABLE CHEST 1 VIEW COMPARISON:  Radiographs 07/15/2020 and 06/24/2020.  CT 06/17/2020. FINDINGS: 1118 hours. Two views are submitted. Tip of the endotracheal tube is in the mid trachea. An enteric tube projects below the diaphragm, tip not visualized. The heart size and mediastinal contours are stable. The lungs are hyperinflated without evidence of superimposed airspace disease, edema, pleural effusion or pneumothorax. No acute osseous findings are evident. Multiple telemetry leads overlie the chest. Patient is status post lower cervical fusion. IMPRESSION: 1. Satisfactory position of the endotracheal and enteric tubes. 2. Chronic obstructive pulmonary disease without evidence of acute cardiopulmonary process. Electronically Signed   By: Richardean Sale M.D.   On: 02/11/2021 11:28   DG Abd Portable 1V  Result Date: 01/31/2021 CLINICAL DATA:  Feeding tube placement EXAM: PORTABLE ABDOMEN - 1 VIEW COMPARISON:  None. FINDINGS: Esophagogastric tube is positioned with tip and side port below the diaphragm. Nonobstructive pattern of included bowel gas. No obvious free air. IMPRESSION: Esophagogastric tube is positioned with tip and side port below the  diaphragm. Electronically Signed   By: Delanna Ahmadi M.D.   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  EEG to evaluate for seizure. Level of alertness: comatose AEDs during EEG study: Propofol, LEV Technical aspects: This EEG study was done with scalp electrodes positioned according to the 10-20 International system of electrode placement. Electrical activity was acquired at a sampling rate of '500Hz'  and reviewed with a high frequency filter of '70Hz'  and a low frequency filter of '1Hz' . EEG data were recorded continuously and digitally stored. Description: EEG showed continuous generalized background suppression.  EEG was not reactive to tactile stimulation.  Hyperventilation and photic stimulation were not performed.   ABNORMALITY -Background suppression, generalized IMPRESSION:  This study is suggestive of profound diffuse encephalopathy, nonspecific etiology.  However given history of cardiac arrest could be significant anoxic/hypoxic brain injury.  No definite seizures or epileptiform discharges were seen throughout the recording. Priyanka Barbra Sarks   Overnight EEG with video  Result Date: 01/31/2021 Lora Havens, MD     02/01/2021  8:25 AM Patient Name: Fred Franzen MRN: [id number] Epilepsy Attending: Lora Havens Referring Physician/Provider: Dr Jacky Kindle Duration: 02/06/2021 1732 to 01/31/2021 1732  Patient history:  50 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  EEG to evaluate for seizure.  Level of alertness: comatose  AEDs during EEG study: Propofol, LEV  Technical aspects: This EEG study was done with scalp electrodes positioned according to the 10-20 International system of  electrode placement. Electrical activity was acquired at a sampling rate of '500Hz'  and reviewed with a high frequency filter of '70Hz'  and a low frequency filter of '1Hz' . EEG data were recorded continuously and digitally stored.  Description: EEG initially showed continuous generalized background suppression. Gradually, EEG improved and showed 4 to 6 Hz theta slowing admixed with intermittent 1 to 3 seconds generalized EEG attenuation.  Patient was noted to have intermittent bilateral shoulder and lower extremity semirhythmic jerking.  Concomitant EEG before, during and after the event did not show any definitive EEG change to suggest seizure. Hyperventilation and photic stimulation were not performed.    ABNORMALITY -Continuous slow, generalized  IMPRESSION: This study is suggestive of moderate to severe diffuse encephalopathy, nonspecific etiology. Patient was also noted to have intermittent bilateral shoulder and lower extremity semirhythmic jerking. Concomitant EEG did not show any definitive EEG change to suggest seizure. Lora Havens   ECHOCARDIOGRAM COMPLETE  Result Date: 01/26/2021    ECHOCARDIOGRAM REPORT   Patient Name:   HARVIS MABUS Date of Exam: 02/01/2021 Medical Rec #:  [id number]       Height:       69.0 in Accession #:    [id number]      Weight:       129.6 lb Date of Birth:  1971/03/08       BSA:          1.718 m Patient Age:    28 years        BP:           111/87 mmHg Patient Gender: M               HR:           71 bpm. Exam Location:  Inpatient Procedure: 2D Echo, Cardiac Doppler and Color Doppler Indications:    Cardiac Arrest  History:        Patient has prior history of Echocardiogram examinations. CHF,                 Pulmonary HTN and COPD; Risk Factors:Hypertension.  Sonographer:    Jyl Heinz Referring Phys: [id number] [place]  1. Left ventricular ejection fraction, by estimation, is 55 to 60%. The left ventricle has normal function. The left ventricle has no  regional wall motion abnormalities. There is mild concentric left ventricular hypertrophy. Left ventricular diastolic parameters were normal.  2. Right ventricular systolic function is normal. The right ventricular size is normal. There is normal pulmonary artery systolic pressure.  3. A small pericardial effusion is present. The pericardial effusion is circumferential. There is no evidence of cardiac tamponade.  4. The mitral valve is normal in structure. Trivial mitral valve regurgitation. No  evidence of mitral stenosis.  5. The aortic valve is normal in structure. Aortic valve regurgitation is not visualized. No aortic stenosis is present.  6. The inferior vena cava is dilated in size with <50% respiratory variability, suggesting right atrial pressure of 15 mmHg. Comparison(s): A prior study was performed on 06/18/2020. Compared to prior study the right ventricle is no longer severely dilated. Right atrial is now normal size. Tricuspid regurgitation in no longer moderate, now trivial. FINDINGS  Left Ventricle: Left ventricular ejection fraction, by estimation, is 55 to 60%. The left ventricle has normal function. The left ventricle has no regional wall motion abnormalities. The left ventricular internal cavity size was normal in size. There is  mild concentric left ventricular hypertrophy. Left ventricular diastolic parameters were normal. Right Ventricle: The right ventricular size is normal. No increase in right ventricular wall thickness. Right ventricular systolic function is normal. There is normal pulmonary artery systolic pressure. The tricuspid regurgitant velocity is 2.21 m/s, and  with an assumed right atrial pressure of 15 mmHg, the estimated right ventricular systolic pressure is 76.1 mmHg. Left Atrium: Left atrial size was normal in size. Right Atrium: Right atrial size was normal in size. Pericardium: A small pericardial effusion is present. The pericardial effusion is circumferential. There is no  evidence of cardiac tamponade. Presence of epicardial fat layer. Mitral Valve: The mitral valve is normal in structure. Trivial mitral valve regurgitation. No evidence of mitral valve stenosis. Tricuspid Valve: The tricuspid valve is normal in structure. Tricuspid valve regurgitation is trivial. No evidence of tricuspid stenosis. Aortic Valve: The aortic valve is normal in structure. Aortic valve regurgitation is not visualized. No aortic stenosis is present. Aortic valve peak gradient measures 4.2 mmHg. Pulmonic Valve: The pulmonic valve was not well visualized. Pulmonic valve regurgitation is not visualized. No evidence of pulmonic stenosis. Aorta: The aortic root is normal in size and structure. Venous: The inferior vena cava is dilated in size with less than 50% respiratory variability, suggesting right atrial pressure of 15 mmHg. IAS/Shunts: No atrial level shunt detected by color flow Doppler.  LEFT VENTRICLE PLAX 2D LVIDd:         3.80 cm     Diastology LVIDs:         2.60 cm     LV e' medial:    5.00 cm/s LV PW:         1.20 cm     LV E/e' medial:  8.7 LV IVS:        1.10 cm     LV e' lateral:   5.33 cm/s LVOT diam:     2.00 cm     LV E/e' lateral: 8.1 LV SV:         46 LV SV Index:   27 LVOT Area:     3.14 cm  LV Volumes (MOD) LV vol d, MOD A2C: 65.9 ml LV vol d, MOD A4C: 82.6 ml LV vol s, MOD A2C: 28.5 ml LV vol s, MOD A4C: 34.7 ml LV SV MOD A2C:     37.4 ml LV SV MOD A4C:     82.6 ml LV SV MOD BP:      43.2 ml RIGHT VENTRICLE            IVC RV Basal diam:  3.40 cm    IVC diam: 2.40 cm RV Mid diam:    2.60 cm RV S prime:     6.96 cm/s TAPSE (M-mode): 1.8 cm LEFT ATRIUM  Index        RIGHT ATRIUM          Index LA diam:        3.80 cm 2.21 cm/m   RA Area:     9.65 cm LA Vol (A2C):   26.4 ml 15.37 ml/m  RA Volume:   19.50 ml 11.35 ml/m LA Vol (A4C):   43.3 ml 25.20 ml/m LA Biplane Vol: 34.4 ml 20.02 ml/m  AORTIC VALVE AV Area (Vmax): 2.29 cm AV Vmax:        102.00 cm/s AV Peak Grad:   4.2  mmHg LVOT Vmax:      74.20 cm/s LVOT Vmean:     50.600 cm/s LVOT VTI:       0.147 m  AORTA Ao Root diam: 3.00 cm MITRAL VALVE               TRICUSPID VALVE MV Area (PHT): 3.53 cm    TR Peak grad:   19.5 mmHg MV Decel Time: 215 msec    TR Vmax:        221.00 cm/s MV E velocity: 43.30 cm/s MV A velocity: 36.40 cm/s  SHUNTS MV E/A ratio:  1.19        Systemic VTI:  0.15 m                            Systemic Diam: 2.00 cm Kardie Tobb DO Electronically signed by Berniece Salines DO Signature Date/Time: 02/14/2021/3:53:52 PM    Final      Assessment/Plan:  AKI, oliguric - in setting of PEA arrest +/- cocaine induced injury and possible rhabdo.  Given his AMS and lack of findings on MRI to support anoxic brain injury, his mother has agreed to a trial of CVVHD to see if he clears.  Will keep even and use all 4K/2.5Ca baths.  No heparin given thrombocytopenia.  Hyperkalemia - due to #1 PEA arrest in setting of drug overdoase - per PCCM Acute hypoxic respiratory failure - s/p intubation.  Vent settings per PCCM Post arrest encephalopathy - concerning for anoxic brain injury, has myoclonic jerking and remains unresponsive.  CT with cerebral edema but MRI without extensive injury.  Plan to repeat MRI tomorrow per Neuro.  Currently on Keppra 500 mg bid, clonazepam prn myoclonus.  Recommended a trial of dialysis to see if he clears. Hepatitis B - was on Vemlidy Polysubstance abuse  Severe protein-caloric malnutrition.  On tube feeds   Donetta Potts 02/01/2021, 10:42 AM

## 2021-02-01 NOTE — Procedures (Addendum)
Patient Name: Dravon Nott  MRN: [id number]  Epilepsy Attending: Charlsie Quest  Referring Physician/Provider: Dr Cheri Fowler Duration: 01/31/2021 1732 to 02/01/2021 0855   Patient history:  50 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  EEG to evaluate for seizure.   Level of alertness: lethargic   AEDs during EEG study: LEV   Technical aspects: This EEG study was done with scalp electrodes positioned according to the 10-20 International system of electrode placement. Electrical activity was acquired at a sampling rate of 500Hz  and reviewed with a high frequency filter of 70Hz  and a low frequency filter of 1Hz . EEG data were recorded continuously and digitally stored.    Description: EEG showed predominantly 5 to 6 Hz theta slowing as well as intermittent generalized 2 to 3 Hz delta slowing. Patient was noted to have intermittent bilateral shoulder and lower extremity jerking.  Concomitant EEG before, during and after the event did not show any definitive EEG change to suggest seizure. Hyperventilation and photic stimulation were not performed.      ABNORMALITY -Continuous slow, generalized   IMPRESSION: This study is suggestive of moderate diffuse encephalopathy, nonspecific etiology. Patient was also noted to have intermittent bilateral shoulder and lower extremity jerking without concomitant EEG change and therefore most likely not epileptic.  No definite seizures were seen during the study.  Devondre Guzzetta 

## 2021-02-01 NOTE — Progress Notes (Signed)
Nutrition Follow-up  DOCUMENTATION CODES:   Severe malnutrition in context of chronic illness  INTERVENTION:   Tube feeding via OG tube: - Vital 1.5 @ goal rate of 55 ml/hr (1320 ml/day) - ProSource TF 45 ml TID  Tube feeding regimen provides 2100 kcal, 122 grams of protein, and 1008 ml of H2O.   - d/c MVI with minerals daily and order B-complex with vitamin C to account for losses with CRRT  NUTRITION DIAGNOSIS:   Severe Malnutrition related to chronic illness (COPD, CHF, polysubstance abuse) as evidenced by severe muscle depletion, severe fat depletion.  Ongoing, being addressed via TF  GOAL:   Patient will meet greater than or equal to 90% of their needs  Met via TF at goal  MONITOR:   Vent status, Labs, Weight trends, TF tolerance  REASON FOR ASSESSMENT:   Ventilator, Consult Enteral/tube feeding initiation and management  ASSESSMENT:   50 year old male who presented to the ED on 11/14 after an unwitnessed cardiac arrest with suspected heroin overdose. Concern for hypoxic brain injury given prolonged downtime. PMH of polysubstance abuse including IVDU, COPD, CHF, hepatitis B, osteomyelitis. Pt admitted with shock, AKI, acute hypoxemic respiratory failure. Pt tested positive for influenza.  11/16 - CRRT start  Discussed pt with RN and during ICU rounds. Per CCM, pt with intermittent myoclonus but has cranial reflexes. Plan is to start CRRT today for oliguric AKI. Pt's mother has agreed to a trial of CRRT. RD will adjust nutrition needs accordingly. Will d/c MVI with minerals daily and order B-complex with vitamin C to account for losses with CRRT.  Admit weight: 58.8 kg Current weight: 62.3 kg  Patient is currently intubated on ventilator support MV: 13.9 L/min Temp (24hrs), Avg:98.8 F (37.1 C), Min:97.7 F (36.5 C), Max:99.9 F (37.7 C)  Medications reviewed and include: MVI with minerals daily, tamiflu, protonix, lokelma 10 grams x 3, IV abx, IV  keppra  Labs reviewed: potassium 5.5, BUN 98, creatinine 5.04, phosphorus 5.8, magnesium 2.8, platelets 66 CBG's: 75-115 x 24 hours  UOP: 452 ml x 24 hours I/O's: +3.0 L since admit  Diet Order:   Diet Order     None       EDUCATION NEEDS:   Not appropriate for education at this time  Skin:  Skin Assessment: Reviewed RN Assessment  Last BM:  01/31/21 small type 6  Height:   Ht Readings from Last 1 Encounters:  01/18/2021 '5\' 9"'  (1.753 m)    Weight:   Wt Readings from Last 1 Encounters:  02/01/21 62.3 kg    BMI:  Body mass index is 20.28 kg/m.  Estimated Nutritional Needs:   Kcal:  1900-2100  Protein:  115-130 grams  Fluid:  1.7-1.9 L    Gustavus Bryant, MS, RD, LDN Inpatient Clinical Dietitian Please see AMiON for contact information.

## 2021-02-01 NOTE — Progress Notes (Signed)
LTM EEG discontinued. No skin breakdown noted. Result pending.

## 2021-02-02 ENCOUNTER — Inpatient Hospital Stay (HOSPITAL_COMMUNITY): Payer: Medicaid Other

## 2021-02-02 ENCOUNTER — Ambulatory Visit: Payer: Self-pay | Admitting: Cardiology

## 2021-02-02 DIAGNOSIS — G9340 Encephalopathy, unspecified: Secondary | ICD-10-CM

## 2021-02-02 DIAGNOSIS — E43 Unspecified severe protein-calorie malnutrition: Secondary | ICD-10-CM | POA: Diagnosis not present

## 2021-02-02 DIAGNOSIS — I469 Cardiac arrest, cause unspecified: Secondary | ICD-10-CM | POA: Diagnosis not present

## 2021-02-02 DIAGNOSIS — J9601 Acute respiratory failure with hypoxia: Secondary | ICD-10-CM | POA: Diagnosis not present

## 2021-02-02 DIAGNOSIS — J111 Influenza due to unidentified influenza virus with other respiratory manifestations: Secondary | ICD-10-CM

## 2021-02-02 LAB — CBC
HCT: 39.3 % (ref 39.0–52.0)
Hemoglobin: 10.9 g/dL — ABNORMAL LOW (ref 13.0–17.0)
MCH: 22.8 pg — ABNORMAL LOW (ref 26.0–34.0)
MCHC: 27.7 g/dL — ABNORMAL LOW (ref 30.0–36.0)
MCV: 82.2 fL (ref 80.0–100.0)
Platelets: 58 10*3/uL — ABNORMAL LOW (ref 150–400)
RBC: 4.78 MIL/uL (ref 4.22–5.81)
RDW: 20.2 % — ABNORMAL HIGH (ref 11.5–15.5)
WBC: 4.2 10*3/uL (ref 4.0–10.5)
nRBC: 0 % (ref 0.0–0.2)

## 2021-02-02 LAB — MAGNESIUM
Magnesium: 2.7 mg/dL — ABNORMAL HIGH (ref 1.7–2.4)
Magnesium: 2.4 mg/dL (ref 1.7–2.4)

## 2021-02-02 LAB — URINE CULTURE: Culture: 20000 — AB

## 2021-02-02 LAB — RENAL FUNCTION PANEL
Albumin: 2.4 g/dL — ABNORMAL LOW (ref 3.5–5.0)
Albumin: 2.6 g/dL — ABNORMAL LOW (ref 3.5–5.0)
Anion gap: 6 (ref 5–15)
Anion gap: 10 (ref 5–15)
BUN: 58 mg/dL — ABNORMAL HIGH (ref 6–20)
BUN: 49 mg/dL — ABNORMAL HIGH (ref 6–20)
CO2: 28 mmol/L (ref 22–32)
CO2: 27 mmol/L (ref 22–32)
Calcium: 7.7 mg/dL — ABNORMAL LOW (ref 8.9–10.3)
Calcium: 8 mg/dL — ABNORMAL LOW (ref 8.9–10.3)
Chloride: 104 mmol/L (ref 98–111)
Chloride: 102 mmol/L (ref 98–111)
Creatinine, Ser: 2.78 mg/dL — ABNORMAL HIGH (ref 0.61–1.24)
Creatinine, Ser: 2.5 mg/dL — ABNORMAL HIGH (ref 0.61–1.24)
GFR, Estimated: 27 mL/min — ABNORMAL LOW (ref >60)
GFR, Estimated: 30 mL/min — ABNORMAL LOW (ref >60)
Glucose, Bld: 116 mg/dL — ABNORMAL HIGH (ref 70–99)
Glucose, Bld: 81 mg/dL (ref 70–99)
Phosphorus: 3.5 mg/dL (ref 2.5–4.6)
Phosphorus: 3.9 mg/dL (ref 2.5–4.6)
Potassium: 4.3 mmol/L (ref 3.5–5.1)
Potassium: 5.1 mmol/L (ref 3.5–5.1)
Sodium: 138 mmol/L (ref 135–145)
Sodium: 139 mmol/L (ref 135–145)

## 2021-02-02 LAB — GLUCOSE, CAPILLARY
Glucose-Capillary: 109 mg/dL — ABNORMAL HIGH (ref 70–99)
Glucose-Capillary: 110 mg/dL — ABNORMAL HIGH (ref 70–99)
Glucose-Capillary: 110 mg/dL — ABNORMAL HIGH (ref 70–99)
Glucose-Capillary: 120 mg/dL — ABNORMAL HIGH (ref 70–99)
Glucose-Capillary: 76 mg/dL (ref 70–99)
Glucose-Capillary: 91 mg/dL (ref 70–99)

## 2021-02-02 LAB — TRIGLYCERIDES: Triglycerides: 226 mg/dL — ABNORMAL HIGH (ref <150)

## 2021-02-02 MED ORDER — TENOFOVIR ALAFENAMIDE FUMARATE 25 MG PO TABS
25.0000 mg | ORAL_TABLET | Freq: Every day | ORAL | Status: DC
Start: 2021-02-02 — End: 2021-02-03
  Administered 2021-02-02 – 2021-02-03 (×2): 25 mg
  Filled 2021-02-02 (×2): qty 1

## 2021-02-02 MED ORDER — NUTRISOURCE FIBER PO PACK
1.0000 | PACK | Freq: Two times a day (BID) | ORAL | Status: DC
Start: 2021-02-02 — End: 2021-02-03
  Administered 2021-02-02 – 2021-02-03 (×3): 1
  Filled 2021-02-02 (×4): qty 1

## 2021-02-02 NOTE — Progress Notes (Signed)
Pt transported to MRI and back to 3M12 without complications.

## 2021-02-02 NOTE — Progress Notes (Signed)
Patient ID: David Short, male   DOB: 1970-04-11, 49 y.o.   MRN: [id number] S: s/p RIJ temp HD catheter placement by PCCM yesterday and started on CVVHD.  No events overnight.  Pt was seen and examined while on CVVHD and orders and labs reviewed with adjustments made as necessary. O:BP (!) 135/91 (BP Location: Right Arm)   Pulse 71   Temp (!) 97 F (36.1 C)   Resp (!) 24   Ht '5\' 9"'  (1.753 m)   Wt 63.3 kg   SpO2 95%   BMI 20.61 kg/m   Intake/Output Summary (Last 24 hours) at 02/02/2021 0956 Last data filed at 02/02/2021 0912 Gross per 24 hour  Intake 2382.9 ml  Output 2318 ml  Net 64.9 ml   Intake/Output: I/O last 3 completed shifts: In: 3540.1 [I.V.:1245.5; NG/GT:1744.8; IV Piggyback:549.7] Out: 2359 [Urine:737; Other:1622]  Intake/Output this shift:  Total I/O In: 364.2 [I.V.:54.2; NG/GT:210; IV Piggyback:100] Out: 216 [Urine:50; Other:166] Weight change: 1 kg JQG:BEEFEOFHQ, sedated, cachectic CVS: RRR Resp: scattered rhonchi bilaterally Abd: +Bs, soft, NT/ND Ext: no edema  Recent Labs  Lab 02/01/2021 1100 02/12/2021 1213 02/15/2021 1517 02/13/2021 2051 01/31/21 0403 01/31/21 1617 02/01/21 0400 02/01/21 0705 02/01/21 1207 02/01/21 1706 02/01/21 2145 02/02/21 0310  NA 141   < > 142 143 139  --  142 141  --  142  --  138  K 7.2*   < > 5.7* 4.7 4.9  --  6.2* 5.5* 5.7* 5.6*  5.8* 4.7 4.3  CL 106  --   --  105 104  --  109 107  --  109  --  104  CO2 20*  --   --  27 21*  --  25 26  --  24  --  28  GLUCOSE 52*  --   --  146* 94  --  121* 216*  --  120*  --  116*  BUN 80*  --   --  91* 96*  --  98* 98*  --  89*  --  58*  CREATININE 4.67*  --   --  4.21* 4.58*  --  4.92* 5.04*  --  4.25*  --  2.78*  ALBUMIN 3.0*  --   --   --   --   --   --   --   --  2.4*  --  2.4*  CALCIUM 8.7*  --   --  7.7* 7.9*  --  8.1* 8.0*  --  7.8*  --  7.7*  PHOS  --   --   --   --   --  5.5* 5.8*  --   --  5.1*  5.1*  --  3.5  AST 196*  --   --   --   --   --   --   --   --   --   --   --    ALT 86*  --   --   --   --   --   --   --   --   --   --   --    < > = values in this interval not displayed.   Liver Function Tests: Recent Labs  Lab 02/09/2021 1100 02/01/21 1706 02/02/21 0310  AST 196*  --   --   ALT 86*  --   --   ALKPHOS 108  --   --   BILITOT 2.0*  --   --  PROT 8.4*  --   --   ALBUMIN 3.0* 2.4* 2.4*   Recent Labs  Lab 01/31/2021 1100  LIPASE 64*   No results for input(s): AMMONIA in the last 168 hours. CBC: Recent Labs  Lab 02/02/2021 1100 01/26/2021 1213 02/10/2021 1355 01/31/2021 1517 02/06/2021 2051 01/31/21 0403 02/01/21 0400 02/02/21 0310  WBC 13.4*  --  7.5  --  11.1* 7.1 5.1 4.2  NEUTROABS 10.7*  --   --   --  9.4*  --  4.3  --   HGB 13.5   < > 12.0*   < > 13.3 12.6* 11.3* 10.9*  HCT 53.1*   < > 45.1   < > 47.1 45.4 40.0 39.3  MCV 91.9  --  85.7  --  81.3 80.6 80.2 82.2  PLT PLATELET CLUMPS NOTED ON SMEAR, UNABLE TO ESTIMATE  --  66*  --  94* 78* 66* 58*   < > = values in this interval not displayed.   Cardiac Enzymes: Recent Labs  Lab 02/01/21 1207  CKTOTAL 43*   CBG: Recent Labs  Lab 02/01/21 1608 02/01/21 1946 02/01/21 2307 02/02/21 0342 02/02/21 0717  GLUCAP 122* 110* 102* 110* 109*    Iron Studies: No results for input(s): IRON, TIBC, TRANSFERRIN, FERRITIN in the last 72 hours. Studies/Results: DG Chest 1 View  Result Date: 02/01/2021 CLINICAL DATA:  Central line placement. EXAM: CHEST  1 VIEW COMPARISON:  02/04/2021 FINDINGS: 1112 hours. Endotracheal tube tip is approximately 5.8 cm above the base of the carina. The NG tube passes into the stomach although the distal tip position is not included on the film. Left IJ central line tip overlies the mid SVC level. No evidence for pneumothorax. No left pleural effusion. IMPRESSION: Left IJ central line tip overlies the mid SVC level. No evidence for pneumothorax or pleural effusion. Electronically Signed   By: Misty Stanley M.D.   On: 02/01/2021 11:24   DG Chest Port 1  View  Result Date: 02/02/2021 CLINICAL DATA:  Influenza J11.1 (ICD-10-CM) EXAM: PORTABLE CHEST 1 VIEW COMPARISON:  February 01, 2021. FINDINGS: Stable positioning of lines/tubes. Chronic hyperinflation. No consolidation. No visible pleural effusions or pneumothorax. Cardiomediastinal silhouette is within normal limits. No acute osseous abnormality. IMPRESSION: No evidence of acute intracranial abnormality. Chronic hyperinflation Electronically Signed   By: Margaretha Sheffield M.D.   On: 02/02/2021 08:17   DG Abd Portable 1V  Result Date: 01/31/2021 CLINICAL DATA:  Feeding tube placement EXAM: PORTABLE ABDOMEN - 1 VIEW COMPARISON:  None. FINDINGS: Esophagogastric tube is positioned with tip and side port below the diaphragm. Nonobstructive pattern of included bowel gas. No obvious free air. IMPRESSION: Esophagogastric tube is positioned with tip and side port below the diaphragm. Electronically Signed   By: Delanna Ahmadi M.D.   On: 01/31/2021 13:42    B-complex with vitamin C  1 tablet Per Tube Daily   chlorhexidine gluconate (MEDLINE KIT)  15 mL Mouth Rinse BID   Chlorhexidine Gluconate Cloth  6 each Topical Daily   feeding supplement (PROSource TF)  45 mL Per Tube TID   fiber  1 packet Per Tube BID   heparin  5,000 Units Subcutaneous Q8H   mouth rinse  15 mL Mouth Rinse 10 times per day   mupirocin ointment  1 application Nasal BID   oseltamivir  30 mg Per Tube QHS   pantoprazole sodium  40 mg Per Tube Daily   sodium chloride flush  10-40 mL Intracatheter Q12H  BMET    Component Value Date/Time   NA 138 02/02/2021 0310   K 4.3 02/02/2021 0310   CL 104 02/02/2021 0310   CO2 28 02/02/2021 0310   GLUCOSE 116 (H) 02/02/2021 0310   BUN 58 (H) 02/02/2021 0310   CREATININE 2.78 (H) 02/02/2021 0310   CREATININE 0.64 (L) 08/22/2020 0000   CALCIUM 7.7 (L) 02/02/2021 0310   GFRNONAA 27 (L) 02/02/2021 0310   GFRNONAA 114 08/22/2020 0000   GFRAA 132 08/22/2020 0000   CBC    Component  Value Date/Time   WBC 4.2 02/02/2021 0310   RBC 4.78 02/02/2021 0310   HGB 10.9 (L) 02/02/2021 0310   HGB 12.1 (L) 08/02/2020 1242   HCT 39.3 02/02/2021 0310   PLT 58 (L) 02/02/2021 0310   PLT 197 08/02/2020 1242   MCV 82.2 02/02/2021 0310   MCH 22.8 (L) 02/02/2021 0310   MCHC 27.7 (L) 02/02/2021 0310   RDW 20.2 (H) 02/02/2021 0310   LYMPHSABS 0.5 (L) 02/01/2021 0400   MONOABS 0.3 02/01/2021 0400   EOSABS 0.0 02/01/2021 0400   BASOSABS 0.0 02/01/2021 0400    Assessment/Plan:  AKI, oliguric - in setting of PEA arrest +/- cocaine induced injury and possible rhabdo.  Given his AMS and lack of findings on MRI to support anoxic brain injury, his mother has agreed to a trial of CVVHD to see if he clears.   CVVHD orders:  4K/2.5Ca bath, pre-filter 300 ml/hr, post-filter 200 ml/hr, dialysate 1000 ml/hr Will keep even.   No heparin given thrombocytopenia.  Hyperkalemia - due to #1 resolved with CVVHD. PEA arrest in setting of drug overdoase - per PCCM Acute hypoxic respiratory failure - s/p intubation.  Vent settings per PCCM Post arrest encephalopathy - concerning for anoxic brain injury, has myoclonic jerking and remains unresponsive.  CT with cerebral edema but MRI without extensive injury.  Plan to repeat MRI tomorrow per Neuro.  Currently on Keppra 500 mg bid, clonazepam prn myoclonus.  Recommended a trial of dialysis to see if he clears. Hepatitis B - was on Vemlidy Polysubstance abuse  Severe protein-caloric malnutrition.  On tube feeds  Donetta Potts, MD [employer] [phone]

## 2021-02-02 NOTE — Progress Notes (Signed)
Subjective: No significant changes  Exam: Vitals:   02/02/21 2000 02/02/21 2019  BP: (!) 144/90   Pulse: 84   Resp: (!) 24   Temp:    SpO2: 94% 95%   Gen: In bed, NAD Resp: non-labored breathing, no acute distress Abd: soft, nt  Neuro: MS: Eyes open partially to noxious stimulation, does not follow commands, does not engage with examiner CN: Pupils equal round and reactive, corneals intact Motor: Extension to noxious stimulation in bilateral upper extremities,  no movement in left lower, minimal flexion right lower.  Sensory: As above  Pertinent Labs: BUN 98  Impression: 50 year old male with likely anoxic brain injury after cardiac arrest.  Early myoclonus is well as the initial suppression on EEG are highly concerning for poor prognosis.  Repeat MRI today did demonstrate significant hypoxic brain injury, which coupled with his extension and EEG/myoclonus all combined to strongly suggest poor prognosis.  Recommendations: 1) continue Keppra 500 twice daily 2) could use propofol at low-dose for comfort   Ritta Slot, MD [facility] [phone]  If 7pm- 7am, please page neurology on call as listed in AMION. 02/02/2021  8:36 PM

## 2021-02-02 NOTE — Plan of Care (Signed)
  Problem: Activity: Goal: Ability to tolerate increased activity will improve Outcome: Not Progressing   Problem: Respiratory: Goal: Ability to maintain a clear airway and adequate ventilation will improve Outcome: Not Progressing   Problem: Role Relationship: Goal: Method of communication will improve Outcome: Not Progressing   

## 2021-02-02 NOTE — Care Plan (Signed)
GOALS OF CARE DISCUSSION   The Clinical status was relayed to daughter at bedside  in detail.   Updated and notified of patients medical condition.     Patient remains unresponsive and will not open eyes to command.   Patient is having a weak cough and struggling to remove secretions.   Patient with increased WOB and using accessory muscles to breathe Explained to family course of therapy and the modalities  Signs of brain damage Evidence of kidney failure Recommend follow up NEUROLOGY RECS   Patient with Progressive multiorgan failure with a very high probablity of a very minimal chance of meaningful recovery despite all aggressive and optimal medical therapy.  Patient family decided to proceed with DNR and they would like to have a family meeting which is scheduled for tomorrow at 67 AM     Family are satisfied with Plan of action and management. All questions answered  DNR orders written    Cheri Fowler MD [facility] See Amion for pager If no response to pager, please call [phone] until 7pm After 7pm, Please call E-link [phone]

## 2021-02-02 NOTE — Progress Notes (Signed)
NAME:  David Short, MRN:  [id number], DOB:  Nov 26, 1970, LOS: 3 ADMISSION DATE:  02/15/2021 CONSULTATION DATE:  01/24/2021 REFERRING MD:  Jeraldine Loots - EDP CHIEF COMPLAINT:  Post-arrest, possible overdose   Brief summary:   Mr. Leh is a 50 yo male who presented to the ED via EMS 11/14 in PEA arrest, thought to be due to heroin overdose. Achieved ROSC en route with EMS s/p CPR and epi x2. In ED, was intubated and was found to be influenza A positive. CT head demonstrated diffuse cerebral edema concerning for hypoxic brain injury.   On HOD #2, patient off vasopressors and lactic acidosis improved. Continued tamiflu and empiric IV ceftriaxone. Patient noted to have AKI and acute renal failure. EEG suggested severe diffuse encephalopathy with nonspecific etiology.   HOD #3, HD discussed with mother who was amenable. Nephro consulted, started on CVVHD. CK 43. 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   HOD #4, continues on CVVHD. Patient currently sedated with propofol, ordered by Dr. Katrinka Blazing starting 11/16 at 1626. Today after MRI will DC propofol.   Pertinent Medical History:   PMHx significant for CHF (Echo 06/2020 EF 65-70%, severe RV enlargement with elevated PASP), COPD (noncompliant with HOT), polysubstance abuse (IVDU, tobacco, cocaine), Hepatitis B (on Vemlidy), osteomyelitis, IDA and thrombocytopenia. Previously arrested requiring CPR ~06/2020.  Significant Hospital Events: Including procedures, antibiotic start and stop dates in addition to other pertinent events   11/14 - Presented to ED via EMS post-arrest, ?2 hours downtime post-heroin use. CT Head with cerebral edema and c/f hypoxic ischemic injury. LA > 9, ARF with K 7.9 (shifted), Cr > 4 (baseline < 1). 11/15 - Occasional myoclonus. Ongoing LTM EEG without seizure activity. Neuro exam slightly improved from prior, but  remains poor even off of sedation. MRI brain without significant abnormality. Neuro following. 11/16 Myoclonus non-epileptiform. Started CVVHD.   Interim History / Subjective:  Patient unresponsive. No acute distress.   Objective:  Blood pressure 133/85, pulse 70, temperature (!) 97.5 F (36.4 C), temperature source Bladder, resp. rate (!) 24, height 5\' 9"  (1.753 m), weight 63.3 kg, SpO2 98 %.    Vent Mode: PRVC FiO2 (%):  [40 %] 40 % Set Rate:  [24 bmp] 24 bmp Vt Set:  [560 mL] 560 mL PEEP:  [5 cmH20] 5 cmH20 Plateau Pressure:  [16 cmH20-28 cmH20] 28 cmH20   Intake/Output Summary (Last 24 hours) at 02/02/2021 1151 Last data filed at 02/02/2021 1100 Gross per 24 hour  Intake 2444.55 ml  Output 2625 ml  Net -180.45 ml   Filed Weights   01/25/2021 2000 02/01/21 0500 02/02/21 0600  Weight: 58.8 kg 62.3 kg 63.3 kg   Physical Examination: Gen: sedated and intubated thin cachetic ill-appearing man Cardiac: RRR, no murmur Resp: CTAB Abd: soft, non-distended, BS present Extremities: no BLE edema  Resolved Hospital Problem List:  None thus far  Assessment & Plan:   PEA arrest, likely in the setting of drug overdose Concern for hypoxic vs. anoxic brain injury Neuro continued EEG, demonstrated continued moderate diffuse encephalopathy. Myoclonus non-epileptiform. Repeat MRI ordered for this morning.  - appreciate ongoing neurology evaluation and recommendations - AEDs per neuro - f/u MRI results - trial off sedation after MRI today - continue neuroprotective measures: HOB > 30 degrees, normoglycemia, normothermia, electrolytes WNL    Acute hypoxemic respiratory failure COPD, noncompliant Influenza A positive - continue tamiflu x 5 days  (11/15 - 11/20) -  continue full vent support - wean FiO2 as able - continue daily WUA/SBT as neuro status permits - VAP bundle - pulmonary hygiene  AKI with acute renal failure  UTI CVVHD since 11/16, tolerating well. Hyperkalemia  resolved with CVVHD. Urine culture (notably collected after antibiotic start) resulted with 20k colonoies serratia marcescens, reported sensitive to CTX.  - continue CTX - continue monitoring I/Os - replete electrolytes as indicated - AM renal function panel, mag, phos   Hepatitis B History of hepatitis B in the setting of IVDU. On Vemlidy at home, previously held on admission for low creatinine clearance.  - Restart Vemlidy today   Protein-calorie malnutrition Appreciate dietician continued involvement and recommendations.   Best Practice: (right click and "Reselect all SmartList Selections" daily)   Diet/type: TF DVT prophylaxis: prophylactic heparin  GI prophylaxis: PPI Lines: N/A Foley:  Yes, and it is still needed Code Status:  full code Last date of multidisciplinary goals of care discussion [11/16 with mother, give more time for prognostication]   Fayette Pho, MD Family Medicine PGY-2 [facility] 02/02/21 11:51 AM  Please see [email or web address] for pager details.  From 7A-7P if no response, please call [phone] After hours, please call ELink [phone]

## 2021-02-03 DIAGNOSIS — J111 Influenza due to unidentified influenza virus with other respiratory manifestations: Secondary | ICD-10-CM | POA: Diagnosis not present

## 2021-02-03 DIAGNOSIS — I469 Cardiac arrest, cause unspecified: Secondary | ICD-10-CM | POA: Diagnosis not present

## 2021-02-03 DIAGNOSIS — E43 Unspecified severe protein-calorie malnutrition: Secondary | ICD-10-CM | POA: Diagnosis not present

## 2021-02-03 LAB — GLUCOSE, CAPILLARY
Glucose-Capillary: 94 mg/dL (ref 70–99)
Glucose-Capillary: 137 mg/dL — ABNORMAL HIGH (ref 70–99)
Glucose-Capillary: 112 mg/dL — ABNORMAL HIGH (ref 70–99)
Glucose-Capillary: 125 mg/dL — ABNORMAL HIGH (ref 70–99)

## 2021-02-03 LAB — RENAL FUNCTION PANEL
Albumin: 2.5 g/dL — ABNORMAL LOW (ref 3.5–5.0)
Anion gap: 6 (ref 5–15)
BUN: 35 mg/dL — ABNORMAL HIGH (ref 6–20)
CO2: 25 mmol/L (ref 22–32)
Calcium: 8.3 mg/dL — ABNORMAL LOW (ref 8.9–10.3)
Chloride: 105 mmol/L (ref 98–111)
Creatinine, Ser: 1.8 mg/dL — ABNORMAL HIGH (ref 0.61–1.24)
GFR, Estimated: 45 mL/min — ABNORMAL LOW (ref >60)
Glucose, Bld: 104 mg/dL — ABNORMAL HIGH (ref 70–99)
Phosphorus: 3.3 mg/dL (ref 2.5–4.6)
Potassium: 5.1 mmol/L (ref 3.5–5.1)
Sodium: 136 mmol/L (ref 135–145)

## 2021-02-03 LAB — CBC
HCT: 40.2 % (ref 39.0–52.0)
Hemoglobin: 11 g/dL — ABNORMAL LOW (ref 13.0–17.0)
MCH: 22.5 pg — ABNORMAL LOW (ref 26.0–34.0)
MCHC: 27.4 g/dL — ABNORMAL LOW (ref 30.0–36.0)
MCV: 82.4 fL (ref 80.0–100.0)
Platelets: 52 10*3/uL — ABNORMAL LOW (ref 150–400)
RBC: 4.88 MIL/uL (ref 4.22–5.81)
RDW: 20 % — ABNORMAL HIGH (ref 11.5–15.5)
WBC: 5.6 10*3/uL (ref 4.0–10.5)
nRBC: 0 % (ref 0.0–0.2)

## 2021-02-03 LAB — MAGNESIUM: Magnesium: 2.3 mg/dL (ref 1.7–2.4)

## 2021-02-03 LAB — PHOSPHORUS: Phosphorus: 3.3 mg/dL (ref 2.5–4.6)

## 2021-02-03 MED ORDER — FENTANYL 2500MCG IN NS 250ML (10MCG/ML) PREMIX INFUSION: 0.0000 ug/h | INTRAVENOUS | Status: DC

## 2021-02-03 MED ORDER — HEPARIN SODIUM (PORCINE) 1000 UNIT/ML IJ SOLN
1000.0000 [IU] | Freq: Once | INTRAMUSCULAR | Status: AC
  Administered 2021-02-03: 2800 [IU] via INTRAVENOUS
  Filled 2021-02-03: qty 3
  Filled 2021-02-03: qty 6

## 2021-02-03 MED ORDER — GLYCOPYRROLATE 0.2 MG/ML IJ SOLN
0.1000 mg | Freq: Once | INTRAMUSCULAR | Status: AC
Start: 2021-02-03 — End: 2021-02-03
  Administered 2021-02-03: 0.1 mg via INTRAVENOUS
  Filled 2021-02-03: qty 1

## 2021-02-03 MED ORDER — MIDAZOLAM HCL 2 MG/2ML IJ SOLN: 2.0000 mg | INTRAMUSCULAR | Status: DC | PRN

## 2021-02-16 NOTE — Progress Notes (Signed)
Discussed with family that he has not made meaningful progress in the interim, best motor continues to be extension in the upper extremities, had triple flexion in the left leg previously, though did not see that today. Based on multiple prognostic indicators, he does not have any meaningful chance at recovery to independent function. Based on this, the family wants to proceed with comfort measures, but have some family members visit first.   Neurology will continue to be available for family questions.   Ritta Slot, MD [facility] [phone]  If 7pm- 7am, please page neurology on call as listed in AMION.

## 2021-02-16 NOTE — Progress Notes (Signed)
Decision from family meeting noted.  Family is amenable to transition to comfort measures and stop CVVHD.  Will sign off.  Please call with questions or concerns.

## 2021-02-16 NOTE — Progress Notes (Addendum)
NAME:  David Short, MRN:  [id number], DOB:  November 13, 1970, LOS: 4 ADMISSION DATE:  01/29/2021 CONSULTATION DATE:  02/01/2021 REFERRING MD:  Jeraldine Loots - EDP CHIEF COMPLAINT:  Post-arrest, possible overdose   Brief summary:   [name] is a 50 yo male who presented to the ED via EMS 11/14 in PEA arrest, thought to be due to heroin overdose. Achieved ROSC en route with EMS s/p CPR and epi x2. In ED, was intubated and was found to be influenza A positive. CT head demonstrated diffuse cerebral edema concerning for hypoxic brain injury.   On HOD #2, patient off vasopressors and lactic acidosis improved. Continued tamiflu and empiric IV ceftriaxone. Patient noted to have AKI and acute renal failure. EEG suggested severe diffuse encephalopathy with nonspecific etiology.   HOD #3, HD discussed with mother who was amenable. Nephro consulted, started on CVVHD. CK 43. 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   HOD #4, continues on CVVHD. Patient currently sedated with propofol, ordered by Dr. Katrinka Blazing starting 11/16 at 1626. After MRI, propofol weaned. After discussion with family, patient made DNR.   HOD#5, no changes to status. Continues on CVVHD, tamiflu, and CTX. Plan for family meeting at 1100.   Pertinent Medical History:   PMHx significant for CHF (Echo 06/2020 EF 65-70%, severe RV enlargement with elevated PASP), COPD (noncompliant with HOT), polysubstance abuse (IVDU, tobacco, cocaine), Hepatitis B (on Vemlidy), osteomyelitis, IDA and thrombocytopenia. Previously arrested requiring CPR ~06/2020.  Significant Hospital Events: Including procedures, antibiotic start and stop dates in addition to other pertinent events   11/14 - Presented to ED via EMS post-arrest, ?2 hours downtime post-heroin use. CT Head with cerebral edema and c/f hypoxic ischemic injury. LA > 9, ARF with K 7.9  (shifted), Cr > 4 (baseline < 1). 11/15 - Occasional myoclonus. Ongoing LTM EEG without seizure activity. Neuro exam slightly improved from prior, but remains poor even off of sedation. MRI brain without significant abnormality. Neuro following. 11/16 Myoclonus non-epileptiform. Started CVVHD.  11/17 Made DNR after discussion with family 11/18 Family meeting at 1100, outcome TBD  Interim History / Subjective:  Patient intubated and lightly sedated.  No acute distress noted.  Mother at bedside, discussed status and update with her.  Plan for family meeting at 11 AM.  Objective:  Blood pressure (!) 151/92, pulse 85, temperature 98.1 F (36.7 C), temperature source Bladder, resp. rate (!) 31, height 5\' 9"  (1.753 m), weight 58.8 kg, SpO2 93 %.    Vent Mode: PRVC FiO2 (%):  [40 %] 40 % Set Rate:  [24 bmp] 24 bmp Vt Set:  [560 mL] 560 mL PEEP:  [5 cmH20] 5 cmH20 Plateau Pressure:  [18 cmH20-31 cmH20] 31 cmH20   Intake/Output Summary (Last 24 hours) at 02-26-21 1041 Last data filed at 2021/02/26 1000 Gross per 24 hour  Intake 1787.54 ml  Output 1992 ml  Net -204.46 ml   Filed Weights   02/01/21 0500 02/02/21 0600 February 26, 2021 0600  Weight: 62.3 kg 63.3 kg 58.8 kg   Physical Examination: General: Cachectic, thin, ill-appearing man currently intubated Cardiac: Regular rate and rhythm, no murmurs appreciated Respiratory: Clear to auscultation bilaterally, on full vent support Abdomen: Soft, nondistended, bowel sounds present Extremities: No BLE edema  Resolved Hospital Problem List:  Noted this time  Assessment & Plan:   PEA arrest, likely in the setting of drug overdose Concern for hypoxic vs. anoxic brain injury Repeat MRI  concerning for hypoxic ischemic injury, shows bilateral cortical and thalamic areas of restricted diffusion along with gyral swelling.  Patient made DNR yesterday after conversation with family. Plan for family meeting today around 11 AM. - Appreciate neuro  recommendations continue current - Continue to wean sedation - AEDs per neuro - Continue neuroprotective measures    Acute hypoxemic respiratory failure COPD, noncompliant Influenza A positive Continues to require full vent support. - Continue Tamiflu x5 days (11/15-20) - Wean FiO2 as able - Continue pulmonary hygiene - Continue VAP bundle - Continue daily SBT as mental status permits  AKI with acute renal failure  UTI Creatinine continues to downtrend, 1.80 this morning.  Tolerating CRRT well. - Continue ceftriaxone (11/14-19) - Continue strict I's and O's - No electrolyte repletion required at this time - A.m. renal function panel, mag, Phos  Thrombocytopenia Platelets this morning slightly down trended to 52.  Platelets this admission have ranged 52-94. -A.m. CBC  Hepatitis B Continue Vemlidy daily, restarted 11/17.   Protein-calorie malnutrition Appreciate ongoing dietitian evaluation recommendations.  Best Practice: (right click and "Reselect all SmartList Selections" daily)   Diet/type: TF DVT prophylaxis: prophylactic heparin  GI prophylaxis: PPI Lines: N/A Foley:  Yes, and it is still needed Code Status:  full code Last date of multidisciplinary goals of care discussion [11/17 DNR status, next meeting today 11/18 at 1100]   Fayette Pho, MD Family Medicine PGY-2 [facility] 01/19/2021 10:41 AM  Please see [email or web address] for pager details.  From 7A-7P if no response, please call [phone] After hours, please call ELink [phone]

## 2021-02-16 NOTE — Procedures (Signed)
Extubation Procedure Note  Patient Details:   Name: David Short DOB: 01-05-1971 MRN: [id number]   Airway Documentation:    Vent end date: 02/23/2021 Vent end time: 1730   Pt extubated to comfort care with RN at bedside.   Forest Becker 2021/02/23, 5:46 PM

## 2021-02-16 NOTE — Progress Notes (Signed)
Subjective: No changes.   Exam: Vitals:   02/06/2021 1100 01/18/2021 1107  BP: (!) 150/93   Pulse: 84   Resp: (!) 30   Temp:  98.1 F (36.7 C)  SpO2: 93%    Gen: In bed, NAD Resp: non-labored breathing, no acute distress Abd: soft, nt  Neuro: MS: Eyes open partially to noxious stimulation, does not follow commands, does not engage with examiner CN: Pupils equal round and reactive, corneals intact Motor: Extension to noxious stimulation in bilateral upper extremities,  no movement in lower extremities Sensory: As above  Pertinent Labs: BUN 35  Impression: 50 year old male with likely anoxic brain injury after cardiac arrest.  Early myoclonus is well as the initial suppression on EEG are highly correlated with poor prognosis.  Repeat MRI did demonstrate significant hypoxic brain injury, which coupled with his best motor response of extension at > 72 hours and EEG/myoclonus all combined to strongly suggest poor prognosis.  Recommendations: 1) continue Keppra 500 twice daily 2) recommend palliative conversations with family, meeting today at 11:30   This patient is critically ill and at significant risk of neurological worsening, death and care requires constant monitoring of vital signs, hemodynamics,respiratory and cardiac monitoring, neurological assessment, discussion with family, other specialists and medical decision making of high complexity. I spent 35 minutes of neurocritical care time  in the care of  this patient. This was time spent independent of any time provided by nurse practitioner or PA.  Ritta Slot, MD [facility] [phone]  If 7pm- 7am, please page neurology on call as listed in AMION. 01/21/2021  11:27 AM

## 2021-02-16 NOTE — Progress Notes (Signed)
Patient ID: David Short, male   DOB: [birth year], 50 y.o.   MRN: [id number] S:MRI 02/02/21 consistent with significant hypoxic brain injury.  For family meeting today.  Pt seen and examined while on CVVHD and labs and orders reviewed. O:BP (!) 151/92   Pulse 85   Temp 98.1 F (36.7 C) (Bladder)   Resp (!) 31   Ht '5\' 9"'  (1.753 m)   Wt 58.8 kg   SpO2 93%   BMI 19.14 kg/m   Intake/Output Summary (Last 24 hours) at February 16, 2021 1054 Last data filed at 02-16-21 1000 Gross per 24 hour  Intake 1787.54 ml  Output 1992 ml  Net -204.46 ml   Intake/Output: I/O last 3 completed shifts: In: 2954 [I.V.:724.9; NG/GT:1929.1; IV Piggyback:300.1] Out: 3559 [Urine:510; Other:2834]  Intake/Output this shift:  Total I/O In: 514.7 [I.V.:63.8; NG/GT:350.9; IV Piggyback:100] Out: 445 [Urine:168; Other:277] Weight change: -4.5 kg RCB:ULAGTXMIW and sedated CVS:RRR Resp:bilateral rhonchi Abd:+BS, soft,NT/ND Ext: no edema  Recent Labs  Lab 02/06/2021 1100 01/26/2021 1213 01/31/21 0403 01/31/21 1617 02/01/21 0400 02/01/21 0705 02/01/21 1207 02/01/21 1706 02/01/21 2145 02/02/21 0310 02/02/21 1545 02/16/21 0511  NA 141   < > 139  --  142 141  --  142  --  138 139 136  K 7.2*   < > 4.9  --  6.2* 5.5* 5.7* 5.6*  5.8* 4.7 4.3 5.1 5.1  CL 106   < > 104  --  109 107  --  109  --  104 102 105  CO2 20*   < > 21*  --  25 26  --  24  --  '28 27 25  ' GLUCOSE 52*   < > 94  --  121* 216*  --  120*  --  116* 81 104*  BUN 80*   < > 96*  --  98* 98*  --  89*  --  58* 49* 35*  CREATININE 4.67*   < > 4.58*  --  4.92* 5.04*  --  4.25*  --  2.78* 2.50* 1.80*  ALBUMIN 3.0*  --   --   --   --   --   --  2.4*  --  2.4* 2.6* 2.5*  CALCIUM 8.7*   < > 7.9*  --  8.1* 8.0*  --  7.8*  --  7.7* 8.0* 8.3*  PHOS  --   --   --  5.5* 5.8*  --   --  5.1*  5.1*  --  3.5 3.9 3.3  3.3  AST 196*  --   --   --   --   --   --   --   --   --   --   --   ALT 86*  --   --   --   --   --   --   --   --   --   --   --    < > =  values in this interval not displayed.   Liver Function Tests: Recent Labs  Lab 02/11/2021 1100 02/01/21 1706 02/02/21 0310 02/02/21 1545 02-16-21 0511  AST 196*  --   --   --   --   ALT 86*  --   --   --   --   ALKPHOS 108  --   --   --   --   BILITOT 2.0*  --   --   --   --   PROT 8.4*  --   --   --   --  ALBUMIN 3.0*   < > 2.4* 2.6* 2.5*   < > = values in this interval not displayed.   Recent Labs  Lab 01/20/2021 1100  LIPASE 64*   No results for input(s): AMMONIA in the last 168 hours. CBC: Recent Labs  Lab 02/05/2021 1100 02/01/2021 1213 01/18/2021 2051 01/31/21 0403 02/01/21 0400 02/02/21 0310 Feb 16, 2021 0511  WBC 13.4*   < > 11.1* 7.1 5.1 4.2 5.6  NEUTROABS 10.7*  --  9.4*  --  4.3  --   --   HGB 13.5   < > 13.3 12.6* 11.3* 10.9* 11.0*  HCT 53.1*   < > 47.1 45.4 40.0 39.3 40.2  MCV 91.9   < > 81.3 80.6 80.2 82.2 82.4  PLT PLATELET CLUMPS NOTED ON SMEAR, UNABLE TO ESTIMATE   < > 94* 78* 66* 58* 52*   < > = values in this interval not displayed.   Cardiac Enzymes: Recent Labs  Lab 02/01/21 1207  CKTOTAL 43*   CBG: Recent Labs  Lab 02/02/21 1533 02/02/21 1951 02/02/21 2338 16-Feb-2021 0325 February 16, 2021 0729  GLUCAP 76 110* 120* 94 137*    Iron Studies: No results for input(s): IRON, TIBC, TRANSFERRIN, FERRITIN in the last 72 hours. Studies/Results: DG Chest 1 View  Result Date: 02/01/2021 CLINICAL DATA:  Central line placement. EXAM: CHEST  1 VIEW COMPARISON:  02/14/2021 FINDINGS: 1112 hours. Endotracheal tube tip is approximately 5.8 cm above the base of the carina. The NG tube passes into the stomach although the distal tip position is not included on the film. Left IJ central line tip overlies the mid SVC level. No evidence for pneumothorax. No left pleural effusion. IMPRESSION: Left IJ central line tip overlies the mid SVC level. No evidence for pneumothorax or pleural effusion. Electronically Signed   By: Misty Stanley M.D.   On: 02/01/2021 11:24   MR BRAIN WO  CONTRAST  Result Date: 02/02/2021 CLINICAL DATA:  Stroke suspected, likely anoxic brain injury after cardiac arrest. EXAM: MRI HEAD WITHOUT CONTRAST TECHNIQUE: Multiplanar, multiecho pulse sequences of the brain and surrounding structures were obtained without intravenous contrast. COMPARISON:  01/31/2021. FINDINGS: Brain: Restricted diffusion in the bilateral thalami (series 5, image 78), posterior temporal and occipital cortex (series 5, image 75), and bilateral posterior frontal lobe cortex (series 5, image 92). These areas are associated with increased T2 signal and the cortical areas demonstrate mild enlargement, consistent with gyral swelling. No acute hemorrhage, mass, mass effect, or midline shift. Redemonstrated susceptibility artifact in the cerebral hemispheres bilaterally (series 14, image 12, with otherwise unchanged punctate foci of susceptibility in the bilateral frontal lobes and left occipital lobe, unchanged and consistent with prior hemorrhage. No hydrocephalus or extra-axial collection. Vascular: Normal flow voids. Skull and upper cervical spine: Normal marrow signal. Sinuses/Orbits: Mucosal thickening in the right greater than left maxillary sinus, bilateral ethmoid air cells, and left frontal sinus additional T1 hyperintense and T2 hypointense material layering in the sphenoid sinuses and maxillary sinuses may represent hemorrhage. The orbits are unremarkable. Other: Trace fluid in the mastoid air cells. IMPRESSION: Bilateral cortical and thalamic areas of restricted diffusion, with gyral swelling, most likely sequela of hypoxic ischemic injury. Electronically Signed   By: Merilyn Baba M.D.   On: 02/02/2021 13:41   DG Chest Port 1 View  Result Date: 02/02/2021 CLINICAL DATA:  Influenza J11.1 (ICD-10-CM) EXAM: PORTABLE CHEST 1 VIEW COMPARISON:  February 01, 2021. FINDINGS: Stable positioning of lines/tubes. Chronic hyperinflation. No consolidation. No visible pleural effusions or  pneumothorax. Cardiomediastinal silhouette is within normal limits. No acute osseous abnormality. IMPRESSION: No evidence of acute intracranial abnormality. Chronic hyperinflation Electronically Signed   By: Margaretha Sheffield M.D.   On: 02/02/2021 08:17    B-complex with vitamin C  1 tablet Per Tube Daily   chlorhexidine gluconate (MEDLINE KIT)  15 mL Mouth Rinse BID   Chlorhexidine Gluconate Cloth  6 each Topical Daily   feeding supplement (PROSource TF)  45 mL Per Tube TID   fiber  1 packet Per Tube BID   heparin  5,000 Units Subcutaneous Q8H   mouth rinse  15 mL Mouth Rinse 10 times per day   mupirocin ointment  1 application Nasal BID   oseltamivir  30 mg Per Tube QHS   pantoprazole sodium  40 mg Per Tube Daily   sodium chloride flush  10-40 mL Intracatheter Q12H   Tenofovir Alafenamide Fumarate  25 mg Per Tube Daily    BMET    Component Value Date/Time   NA 136 2021/02/23 0511   K 5.1 23-Feb-2021 0511   CL 105 02/23/2021 0511   CO2 25 02-23-21 0511   GLUCOSE 104 (H) 2021-02-23 0511   BUN 35 (H) 02/23/21 0511   CREATININE 1.80 (H) 02/23/21 0511   CREATININE 0.64 (L) 08/22/2020 0000   CALCIUM 8.3 (L) 23-Feb-2021 0511   GFRNONAA 45 (L) 23-Feb-2021 0511   GFRNONAA 114 08/22/2020 0000   GFRAA 132 08/22/2020 0000   CBC    Component Value Date/Time   WBC 5.6 23-Feb-2021 0511   RBC 4.88 02-23-21 0511   HGB 11.0 (L) Feb 23, 2021 0511   HGB 12.1 (L) 08/02/2020 1242   HCT 40.2 02/23/21 0511   PLT 52 (L) February 23, 2021 0511   PLT 197 08/02/2020 1242   MCV 82.4 Feb 23, 2021 0511   MCH 22.5 (L) 23-Feb-2021 0511   MCHC 27.4 (L) 02-23-2021 0511   RDW 20.0 (H) 02/23/21 0511   LYMPHSABS 0.5 (L) 02/01/2021 0400   MONOABS 0.3 02/01/2021 0400   EOSABS 0.0 02/01/2021 0400   BASOSABS 0.0 02/01/2021 0400    Assessment/Plan:  AKI, oliguric - in setting of PEA arrest +/- cocaine induced injury and possible rhabdo.  Given his AMS and lack of findings on MRI to support anoxic brain  injury, his mother has agreed to a trial of CVVHD to see if he clears.  Unfortunately follow up MRI c/w anoxic brain injury.  For family meeting today and will likely discontinue CVVHD given poor prognosis and futility of ongoing aggressive measures.  Await decision.   CVVHD orders:  4K/2.5Ca bath, pre-filter 300 ml/hr, post-filter 200 ml/hr, dialysate 1000 ml/hr Will keep even.   No heparin given thrombocytopenia.  Hyperkalemia - due to #1 resolved with CVVHD. PEA arrest in setting of drug overdoase - per PCCM Acute hypoxic respiratory failure - s/p intubation.  Vent settings per PCCM Post arrest encephalopathy - concerning for anoxic brain injury, has myoclonic jerking and remains unresponsive.  CT with cerebral edema but MRI without extensive injury.  Plan to repeat MRI tomorrow per Neuro.  Currently on Keppra 500 mg bid, clonazepam prn myoclonus.  Recommended a trial of dialysis but unfortunately repeat MRI c/w anoxic brain injury.  Hepatitis B - was on Vemlidy Polysubstance abuse  Severe protein-caloric malnutrition.  On tube feeds Disposition - poor overall prognosis and recommend transition to comfort care and discontinue CVVHD.   Donetta Potts, MD [employer] [phone]

## 2021-02-16 NOTE — Progress Notes (Addendum)
This chaplain responded to a consult for EOL spiritual care.  The chaplain was updated by the Pt. RN-Jessica.    The chaplain understands family is gathered and will be gathering in about 30 minutes at the Pt. bedside. The family agreed to inform the RN when all the family is present and the RN will page a chaplain for a pastoral presence. Per the family's request, the chaplain informed the family of the location of the chapel [id number].  Chaplain Stephanie Acre  **[phone] This chaplain returned to the Pt. bedside with the family. The chaplain invited a time of story telling which led up to a time of sharing sacred space and prayer.  This chaplain offered F/U spiritual care as needed.

## 2021-02-16 NOTE — Death Summary Note (Signed)
DEATH SUMMARY   Patient Details  Name: David Short MRN: [id number] DOB: 1970-06-26  Admission/Discharge Information   Admit Date:  02/14/2021  Date of Death: Date of Death: Feb 07, 2021  Time of Death: Time of Death: 2018/07/06  Length of Stay: 4  Referring Physician: Marcine Matar, MD   Reason(s) for Hospitalization  S/p PEA cardiac arrest in the setting of polysubstance abuse Hypoxic/anoxic brain injury Acute hypoxic/hypercapnic respiratory failure COPD, not in exacerbation Influenza pneumonia Acute renal failure on CRRT Acute UTI with Serratia Thrombocytopenia, stable Hepatitis B Severe protein calorie malnutrition Sepsis was ruled out  Diagnoses  Preliminary cause of death:   Withdrawal of care due to severe anoxic injury Secondary Diagnoses (including complications and co-morbidities):  Active Problems:   Cardiac arrest (HCC)   Protein-calorie malnutrition, severe   Brief Hospital Course (including significant findings, care, treatment, and services provided and events leading to death)  David Short is a 50 y.o. year old male who David Short is a 50 yo male who presented to the ED via EMS 2023-02-04 in PEA arrest, thought to be due to heroin overdose. Achieved ROSC en route with EMS s/p CPR and epi x2. In ED, was intubated and was found to be influenza A positive. CT head demonstrated diffuse cerebral edema concerning for hypoxic brain injury.    On HOD #2, patient off vasopressors and lactic acidosis improved. Continued tamiflu and empiric IV ceftriaxone. Patient noted to have AKI and acute renal failure. EEG suggested severe diffuse encephalopathy with nonspecific etiology.    HOD #3, HD discussed with mother who was amenable. Nephro consulted, started on CVVHD. CK 43. 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    HOD #4, continues on CVVHD. Patient  currently sedated with propofol, ordered by Dr. Katrinka Blazing starting 11/16 at 1626. After MRI, propofol weaned. After discussion with family, patient made DNR.    HOD#5, no changes to status. Continues on CVVHD, tamiflu, and CTX. Plan for family meeting at 1100.   Over the course of repeat MRI brain was done which showed severe anoxic injury, patient's family was contacted, after extensive discussion between critical care and neurology team with the family, they decided to pursue comfort care and palliative extubation.  Patient was palliatively extubated on 02-07-2021, he was declared dead at 8:20 PM.    Pertinent Labs and Studies  Significant Diagnostic Studies DG Chest 1 View  Result Date: 02/01/2021 CLINICAL DATA:  Central line placement. EXAM: CHEST  1 VIEW COMPARISON:  February 03, 2021 FINDINGS: 1112 hours. Endotracheal tube tip is approximately 5.8 cm above the base of the carina. The NG tube passes into the stomach although the distal tip position is not included on the film. Left IJ central line tip overlies the mid SVC level. No evidence for pneumothorax. No left pleural effusion. IMPRESSION: Left IJ central line tip overlies the mid SVC level. No evidence for pneumothorax or pleural effusion. Electronically Signed   By: Kennith Center M.D.   On: 02/01/2021 11:24   CT Head Wo Contrast  Result Date: 02/06/2021 CLINICAL DATA:  Anoxic brain damage, post arrest EXAM: CT HEAD WITHOUT CONTRAST TECHNIQUE: Contiguous axial images were obtained from the base of the skull through the vertex without intravenous contrast. COMPARISON:  06/17/2020 FINDINGS: Brain: Slight narrowing of the sulci and sylvian fissures, which could indicate edema. Possible increased relative density of the cerebellum. No hypodensity to suggest acute infarct. No mass, mass effect, or  midline shift. No acute hemorrhage. No extra-axial collection or hydrocephalus. Vascular: No hyperdense vessel. Skull: No acute osseous abnormality.  Sinuses/Orbits: Partial opacification of the ethmoid air cells, with layering fluid in the maxillary and sphenoid sinuses. Other: The mastoids are well aerated. IMPRESSION: Findings suggestive of cerebral edema, with slight narrowing of the sulci and sylvian fissures and possible increased relative density of the cerebellum, as can be seen with hypoxic ischemic injury. An MRI is recommended for further evaluation. Electronically Signed   By: Wiliam Ke M.D.   On: 02/06/2021 12:04   MR BRAIN WO CONTRAST  Result Date: 02/02/2021 CLINICAL DATA:  Stroke suspected, likely anoxic brain injury after cardiac arrest. EXAM: MRI HEAD WITHOUT CONTRAST TECHNIQUE: Multiplanar, multiecho pulse sequences of the brain and surrounding structures were obtained without intravenous contrast. COMPARISON:  01/31/2021. FINDINGS: Brain: Restricted diffusion in the bilateral thalami (series 5, image 78), posterior temporal and occipital cortex (series 5, image 75), and bilateral posterior frontal lobe cortex (series 5, image 92). These areas are associated with increased T2 signal and the cortical areas demonstrate mild enlargement, consistent with gyral swelling. No acute hemorrhage, mass, mass effect, or midline shift. Redemonstrated susceptibility artifact in the cerebral hemispheres bilaterally (series 14, image 12, with otherwise unchanged punctate foci of susceptibility in the bilateral frontal lobes and left occipital lobe, unchanged and consistent with prior hemorrhage. No hydrocephalus or extra-axial collection. Vascular: Normal flow voids. Skull and upper cervical spine: Normal marrow signal. Sinuses/Orbits: Mucosal thickening in the right greater than left maxillary sinus, bilateral ethmoid air cells, and left frontal sinus additional T1 hyperintense and T2 hypointense material layering in the sphenoid sinuses and maxillary sinuses may represent hemorrhage. The orbits are unremarkable. Other: Trace fluid in the mastoid air  cells. IMPRESSION: Bilateral cortical and thalamic areas of restricted diffusion, with gyral swelling, most likely sequela of hypoxic ischemic injury. Electronically Signed   By: Wiliam Ke M.D.   On: 02/02/2021 13:41   MR BRAIN WO CONTRAST  Result Date: 01/31/2021 CLINICAL DATA:  Initial evaluation for stroke suspected. Cardiac arrest. EXAM: MRI HEAD WITHOUT CONTRAST TECHNIQUE: Multiplanar, multiecho pulse sequences of the brain and surrounding structures were obtained without intravenous contrast. COMPARISON:  Prior CT from 02/11/2021. FINDINGS: Brain: Cerebral volume within normal limits. No significant cerebral white matter disease for age. No abnormal foci of restricted diffusion to suggest acute or subacute ischemia. Gray-white matter differentiation maintained. No MRI findings of acute anoxia or hypoxic ischemic injury at this time. No acute intracranial hemorrhage. Scattered susceptibility artifact seen involving the cerebellar hemispheres bilaterally (series 14, image 18). Associated punctate T1 hyperintensity present at these locations (series 9, images 18, 10). Few additional scattered punctate microhemorrhages noted elsewhere within the brain. Findings are nonspecific, but suggestive of prior hemorrhage or other insult. This is new as compared to previous MRI from 2020. No mass lesion, midline shift or mass effect. No hydrocephalus or extra-axial fluid collection. Pituitary gland and suprasellar region within normal limits. Vascular: Major intracranial vascular flow voids are maintained. Skull and upper cervical spine: Craniocervical junction within normal limits. Diffusely decreased T1 signal intensity seen within the visualized bone marrow. No focal marrow replacing lesion. No scalp soft tissue abnormality. Sinuses/Orbits: Globes and orbital soft tissues demonstrate no acute finding. Scattered mucosal thickening with air-fluid levels noted throughout the paranasal sinuses. Patient is intubated.  Trace left mastoid effusion. Other: None. IMPRESSION: 1. No evidence for anoxic injury or other acute intracranial abnormality. 2. Scattered susceptibility artifact involving the cerebellar hemispheres bilaterally, consistent  with prior hemorrhage. This could be related to prior ischemia or other insult. Few additional scattered punctate microhemorrhages seen elsewhere throughout the brain. Findings are new as compared to 2020. Electronically Signed   By: Rise Mu M.D.   On: 01/31/2021 04:11   DG Chest Port 1 View  Result Date: 02/02/2021 CLINICAL DATA:  Influenza J11.1 (ICD-10-CM) EXAM: PORTABLE CHEST 1 VIEW COMPARISON:  February 01, 2021. FINDINGS: Stable positioning of lines/tubes. Chronic hyperinflation. No consolidation. No visible pleural effusions or pneumothorax. Cardiomediastinal silhouette is within normal limits. No acute osseous abnormality. IMPRESSION: No evidence of acute intracranial abnormality. Chronic hyperinflation Electronically Signed   By: Feliberto Harts M.D.   On: 02/02/2021 08:17   DG Chest Port 1 View  Result Date: 02/07/21 CLINICAL DATA:  Altered level of consciousness. EXAM: PORTABLE CHEST 1 VIEW COMPARISON:  Radiographs 07/15/2020 and 06/24/2020.  CT 06/17/2020. FINDINGS: 1118 hours. Two views are submitted. Tip of the endotracheal tube is in the mid trachea. An enteric tube projects below the diaphragm, tip not visualized. The heart size and mediastinal contours are stable. The lungs are hyperinflated without evidence of superimposed airspace disease, edema, pleural effusion or pneumothorax. No acute osseous findings are evident. Multiple telemetry leads overlie the chest. Patient is status post lower cervical fusion. IMPRESSION: 1. Satisfactory position of the endotracheal and enteric tubes. 2. Chronic obstructive pulmonary disease without evidence of acute cardiopulmonary process. Electronically Signed   By: Carey Bullocks M.D.   On: 2021/02/07 11:28   DG  Abd Portable 1V  Result Date: 01/31/2021 CLINICAL DATA:  Feeding tube placement EXAM: PORTABLE ABDOMEN - 1 VIEW COMPARISON:  None. FINDINGS: Esophagogastric tube is positioned with tip and side port below the diaphragm. Nonobstructive pattern of included bowel gas. No obvious free air. IMPRESSION: Esophagogastric tube is positioned with tip and side port below the diaphragm. Electronically Signed   By: Jearld Lesch M.D.   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  EEG to evaluate for seizure. Level of alertness: comatose AEDs during EEG study: Propofol, LEV Technical aspects: This EEG study was done with scalp electrodes positioned according to the 10-20 International system of electrode placement. Electrical activity was acquired at a sampling rate of  and reviewed with a high frequency filter of  and a low frequency filter of . EEG data were recorded continuously and digitally stored. Description: EEG showed continuous generalized background suppression.  EEG was not reactive to tactile stimulation.  Hyperventilation and photic stimulation were not performed.   ABNORMALITY -Background suppression, generalized IMPRESSION: This study is suggestive of profound diffuse encephalopathy, nonspecific etiology.  However given history of cardiac arrest could be significant anoxic/hypoxic brain injury.  No definite seizures or epileptiform discharges were seen throughout the recording. 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  EEG to evaluate for seizure.  Level of alertness: comatose  AEDs during EEG study: Propofol, LEV  Technical  aspects: This EEG study was done with scalp electrodes positioned according to the 10-20 International system of electrode placement. Electrical activity was acquired at a sampling rate of  and reviewed with a high frequency filter of  and a low frequency filter of . EEG data were recorded continuously and digitally stored.  Description: EEG initially showed continuous generalized background suppression. Gradually, EEG improved and showed 4 to 6 Hz theta slowing admixed with intermittent 1 to 3 seconds generalized EEG attenuation.  Patient was noted to have intermittent bilateral shoulder and lower extremity semirhythmic jerking.  Concomitant EEG before, during and after the event did not show any definitive EEG change to suggest seizure. Hyperventilation and photic stimulation were not performed.    ABNORMALITY -Continuous slow, generalized  IMPRESSION: This study is suggestive of moderate to severe diffuse encephalopathy, nonspecific etiology. Patient was also noted to have intermittent bilateral shoulder and lower extremity semirhythmic jerking. Concomitant EEG did not show any definitive EEG change to suggest seizure. Charlsie Quest   ECHOCARDIOGRAM COMPLETE  Result Date: 02-22-2021    ECHOCARDIOGRAM REPORT   Patient Name:   JAFARI MCKILLOP Date of Exam: 02-22-21 Medical Rec #:  [id number]       Height:       69.0 in Accession #:    [id number]      Weight:       129.6 lb Date of Birth:  [birth year]       BSA:          1.718 m Patient Age:    50 years        BP:           111/87 mmHg Patient Gender: M               HR:           71 bpm. Exam Location:  Inpatient Procedure: 2D Echo, Cardiac Doppler and Color Doppler Indications:     Cardiac Arrest  History:        Patient has prior history of Echocardiogram examinations. CHF,                 Pulmonary HTN and COPD; Risk Factors:Hypertension.  Sonographer:    Cleatis Polka Referring Phys: [id number] Elissia Spiewak IMPRESSIONS  1. Left ventricular ejection fraction, by estimation, is 55 to 60%. The left ventricle has normal function. The left ventricle has no regional wall motion abnormalities. There is mild concentric left ventricular hypertrophy. Left ventricular diastolic parameters were normal.  2. Right ventricular systolic function is normal. The right ventricular size is normal. There is normal pulmonary artery systolic pressure.  3. A small pericardial effusion is present. The pericardial effusion is circumferential. There is no evidence of cardiac tamponade.  4. The mitral valve is normal in structure. Trivial mitral valve regurgitation. No evidence of mitral stenosis.  5. The aortic valve is normal in structure. Aortic valve regurgitation is not visualized. No aortic stenosis is present.  6. The inferior vena cava is dilated in size with <50% respiratory variability, suggesting right atrial pressure of 15 mmHg. Comparison(s): A prior study was performed on 06/18/2020. Compared to prior study the right ventricle is no longer severely dilated. Right atrial is now normal size. Tricuspid regurgitation in no longer moderate, now trivial. FINDINGS  Left Ventricle: Left ventricular ejection fraction, by estimation, is 55 to 60%. The left ventricle has normal function. The left ventricle has no regional wall motion abnormalities. The left ventricular internal cavity size was normal  in size. There is  mild concentric left ventricular hypertrophy. Left ventricular diastolic parameters were normal. Right Ventricle: The right ventricular size is normal. No increase in right ventricular wall thickness. Right ventricular systolic function is normal. There is normal pulmonary artery systolic pressure. The  tricuspid regurgitant velocity is 2.21 m/s, and  with an assumed right atrial pressure of 15 mmHg, the estimated right ventricular systolic pressure is 34.5 mmHg. Left Atrium: Left atrial size was normal in size. Right Atrium: Right atrial size was normal in size. Pericardium: A small pericardial effusion is present. The pericardial effusion is circumferential. There is no evidence of cardiac tamponade. Presence of epicardial fat layer. Mitral Valve: The mitral valve is normal in structure. Trivial mitral valve regurgitation. No evidence of mitral valve stenosis. Tricuspid Valve: The tricuspid valve is normal in structure. Tricuspid valve regurgitation is trivial. No evidence of tricuspid stenosis. Aortic Valve: The aortic valve is normal in structure. Aortic valve regurgitation is not visualized. No aortic stenosis is present. Aortic valve peak gradient measures 4.2 mmHg. Pulmonic Valve: The pulmonic valve was not well visualized. Pulmonic valve regurgitation is not visualized. No evidence of pulmonic stenosis. Aorta: The aortic root is normal in size and structure. Venous: The inferior vena cava is dilated in size with less than 50% respiratory variability, suggesting right atrial pressure of 15 mmHg. IAS/Shunts: No atrial level shunt detected by color flow Doppler.  LEFT VENTRICLE PLAX 2D LVIDd:         3.80 cm     Diastology LVIDs:         2.60 cm     LV e' medial:    5.00 cm/s LV PW:         1.20 cm     LV E/e' medial:  8.7 LV IVS:        1.10 cm     LV e' lateral:   5.33 cm/s LVOT diam:     2.00 cm     LV E/e' lateral: 8.1 LV SV:         46 LV SV Index:   27 LVOT Area:     3.14 cm  LV Volumes (MOD) LV vol d, MOD A2C: 65.9 ml LV vol d, MOD A4C: 82.6 ml LV vol s, MOD A2C: 28.5 ml LV vol s, MOD A4C: 34.7 ml LV SV MOD A2C:     37.4 ml LV SV MOD A4C:     82.6 ml LV SV MOD BP:      43.2 ml RIGHT VENTRICLE            IVC RV Basal diam:  3.40 cm    IVC diam: 2.40 cm RV Mid diam:    2.60 cm RV S prime:     6.96 cm/s  TAPSE (M-mode): 1.8 cm LEFT ATRIUM             Index        RIGHT ATRIUM          Index LA diam:        3.80 cm 2.21 cm/m   RA Area:     9.65 cm LA Vol (A2C):   26.4 ml 15.37 ml/m  RA Volume:   19.50 ml 11.35 ml/m LA Vol (A4C):   43.3 ml 25.20 ml/m LA Biplane Vol: 34.4 ml 20.02 ml/m  AORTIC VALVE AV Area (Vmax): 2.29 cm AV Vmax:        102.00 cm/s AV Peak Grad:   4.2 mmHg LVOT Vmax:  74.20 cm/s LVOT Vmean:     50.600 cm/s LVOT VTI:       0.147 m  AORTA Ao Root diam: 3.00 cm MITRAL VALVE               TRICUSPID VALVE MV Area (PHT): 3.53 cm    TR Peak grad:   19.5 mmHg MV Decel Time: 215 msec    TR Vmax:        221.00 cm/s MV E velocity: 43.30 cm/s MV A velocity: 36.40 cm/s  SHUNTS MV E/A ratio:  1.19        Systemic VTI:  0.15 m                            Systemic Diam: 2.00 cm Kardie Tobb DO Electronically signed by Thomasene Ripple DO Signature Date/Time: 02/12/2021/3:53:52 PM    Final     Microbiology Recent Results (from the past 240 hour(s))  Resp Panel by RT-PCR (Flu A&B, Covid) Nasopharyngeal Swab     Status: Abnormal   Collection Time: 2021-02-12 12:00 PM   Specimen: Nasopharyngeal Swab; Nasopharyngeal(NP) swabs in vial transport medium  Result Value Ref Range Status   SARS Coronavirus 2 by RT PCR NEGATIVE NEGATIVE Final    Comment: (NOTE) SARS-CoV-2 target nucleic acids are NOT DETECTED.  The SARS-CoV-2 RNA is generally detectable in upper respiratory specimens during the acute phase of infection. The lowest concentration of SARS-CoV-2 viral copies this assay can detect is 138 copies/mL. A negative result does not preclude SARS-Cov-2 infection and should not be used as the sole basis for treatment or other patient management decisions. A negative result may occur with  improper specimen collection/handling, submission of specimen other than nasopharyngeal swab, presence of viral mutation(s) within the areas targeted by this assay, and inadequate number of viral copies(<138  copies/mL). A negative result must be combined with clinical observations, patient history, and epidemiological information. The expected result is Negative.  Fact Sheet for Patients:  BloggerCourse.com  Fact Sheet for Healthcare Providers:  SeriousBroker.it  This test is no t yet approved or cleared by the [place] FDA and  has been authorized for detection and/or diagnosis of SARS-CoV-2 by FDA under an Emergency Use Authorization (EUA). This EUA will remain  in effect (meaning this test can be used) for the duration of the COVID-19 declaration under Section 564(b)(1) of the Act, 21 U.S.C.section 360bbb-3(b)(1), unless the authorization is terminated  or revoked sooner.       Influenza A by PCR POSITIVE (A) NEGATIVE Final   Influenza B by PCR NEGATIVE NEGATIVE Final    Comment: (NOTE) The Xpert Xpress SARS-CoV-2/FLU/RSV plus assay is intended as an aid in the diagnosis of influenza from Nasopharyngeal swab specimens and should not be used as a sole basis for treatment. Nasal washings and aspirates are unacceptable for Xpert Xpress SARS-CoV-2/FLU/RSV testing.  Fact Sheet for Patients: BloggerCourse.com  Fact Sheet for Healthcare Providers: SeriousBroker.it  This test is not yet approved or cleared by the [place] FDA and has been authorized for detection and/or diagnosis of SARS-CoV-2 by FDA under an Emergency Use Authorization (EUA). This EUA will remain in effect (meaning this test can be used) for the duration of the COVID-19 declaration under Section 564(b)(1) of the Act, 21 U.S.C. section 360bbb-3(b)(1), unless the authorization is terminated or revoked.  Performed at [facility], [address] [place], [place] [address]   MRSA Next Gen by PCR,  Nasal     Status: Abnormal   Collection Time: 01/31/2021  7:08 PM   Specimen: Nasal Mucosa; Nasal Swab   Result Value Ref Range Status   MRSA by PCR Next Gen DETECTED (A) NOT DETECTED Final    Comment: RESULT CALLED TO, READ BACK BY AND VERIFIED WITH: C SHROWE,RN@[id number]  02/13/2021 MK (NOTE) The GeneXpert MRSA Assay (FDA approved for NASAL specimens only), is one component of a comprehensive MRSA colonization surveillance program. It is not intended to diagnose MRSA infection nor to guide or monitor treatment for MRSA infections. Test performance is not FDA approved in patients less than 6 years old. Performed at [facility], [address] [place], [place] [address]   Urine Culture     Status: Abnormal   Collection Time: 01/31/21 11:42 AM   Specimen: Urine, Catheterized  Result Value Ref Range Status   Specimen Description URINE, CATHETERIZED  Final   Special Requests   Final    NONE Performed at [facility], [address], [place], [place] [address]    Culture 20,000 COLONIES/mL SERRATIA MARCESCENS (A)  Final   Report Status 02/02/2021 FINAL  Final   Organism ID, Bacteria SERRATIA MARCESCENS (A)  Final      Susceptibility   Serratia marcescens - MIC*    CEFAZOLIN >=64 RESISTANT Resistant     CEFEPIME <=0.12 SENSITIVE Sensitive     CEFTRIAXONE <=0.25 SENSITIVE Sensitive     CIPROFLOXACIN <=0.25 SENSITIVE Sensitive     GENTAMICIN <=1 SENSITIVE Sensitive     NITROFURANTOIN 128 RESISTANT Resistant     TRIMETH/SULFA <=20 SENSITIVE Sensitive     * 20,000 COLONIES/mL SERRATIA MARCESCENS    Lab Basic Metabolic Panel: Recent Labs  Lab 02/01/21 0400 02/01/21 0705 02/01/21 1207 02/01/21 1706 02/01/21 2145 02/02/21 0310 02/02/21 1545 02/02/21 1908 04-Feb-2021 0511  NA 142 141  --  142  --  138 139  --  136  K 6.2* 5.5*   < > 5.6*  5.8* 4.7 4.3 5.1  --  5.1  CL 109 107  --  109  --  104 102  --  105  CO2 25 26  --  24  --  28 27  --  25  GLUCOSE 121* 216*  --  120*  --  116* 81  --  104*  BUN 98* 98*  --  89*  --  58* 49*  --  35*  CREATININE 4.92* 5.04*  --   4.25*  --  2.78* 2.50*  --  1.80*  CALCIUM 8.1* 8.0*  --  7.8*  --  7.7* 8.0*  --  8.3*  MG 2.8*  --   --  2.8*  --  2.7*  --  2.4 2.3  PHOS 5.8*  --   --  5.1*  5.1*  --  3.5 3.9  --  3.3  3.3   < > = values in this interval not displayed.   Liver Function Tests: Recent Labs  Lab 02/01/2021 1100 02/01/21 1706 02/02/21 0310 02/02/21 1545 February 04, 2021 0511  AST 196*  --   --   --   --   ALT 86*  --   --   --   --   ALKPHOS 108  --   --   --   --   BILITOT 2.0*  --   --   --   --   PROT 8.4*  --   --   --   --  ALBUMIN 3.0* 2.4* 2.4* 2.6* 2.5*   Recent Labs  Lab 2021/02/28 1100  LIPASE 64*   No results for input(s): AMMONIA in the last 168 hours. CBC: Recent Labs  Lab 02-28-21 1100 2021/02/28 1213 February 28, 2021 2051 01/31/21 0403 02/01/21 0400 02/02/21 0310 02/08/2021 0511  WBC 13.4*   < > 11.1* 7.1 5.1 4.2 5.6  NEUTROABS 10.7*  --  9.4*  --  4.3  --   --   HGB 13.5   < > 13.3 12.6* 11.3* 10.9* 11.0*  HCT 53.1*   < > 47.1 45.4 40.0 39.3 40.2  MCV 91.9   < > 81.3 80.6 80.2 82.2 82.4  PLT PLATELET CLUMPS NOTED ON SMEAR, UNABLE TO ESTIMATE   < > 94* 78* 66* 58* 52*   < > = values in this interval not displayed.   Cardiac Enzymes: Recent Labs  Lab 02/01/21 1207  CKTOTAL 43*   Sepsis Labs: Recent Labs  Lab 28-Feb-2021 1101 2021-02-28 1355 02/28/2021 2051 01/31/21 0403 01/31/21 1419 02/01/21 0400 02/02/21 0310 02/09/2021 0511  WBC  --  7.5   < > 7.1  --  5.1 4.2 5.6  LATICACIDVEN >9.0* 4.9*  --   --  0.8  --   --   --    < > = values in this interval not displayed.    Procedures/Operations     [employer] 02/04/2021, 7:40 AM

## 2021-02-16 DEATH — deceased

## 2021-04-20 IMAGING — CR DG CHEST 2V
2 series · 2 of 2 positions shown · non-contrast
Comparison: Prior chest x-ray 06/21/2020

CLINICAL DATA: Right-sided pleural effusion

EXAM:
CHEST - 2 VIEW

[chest lat]
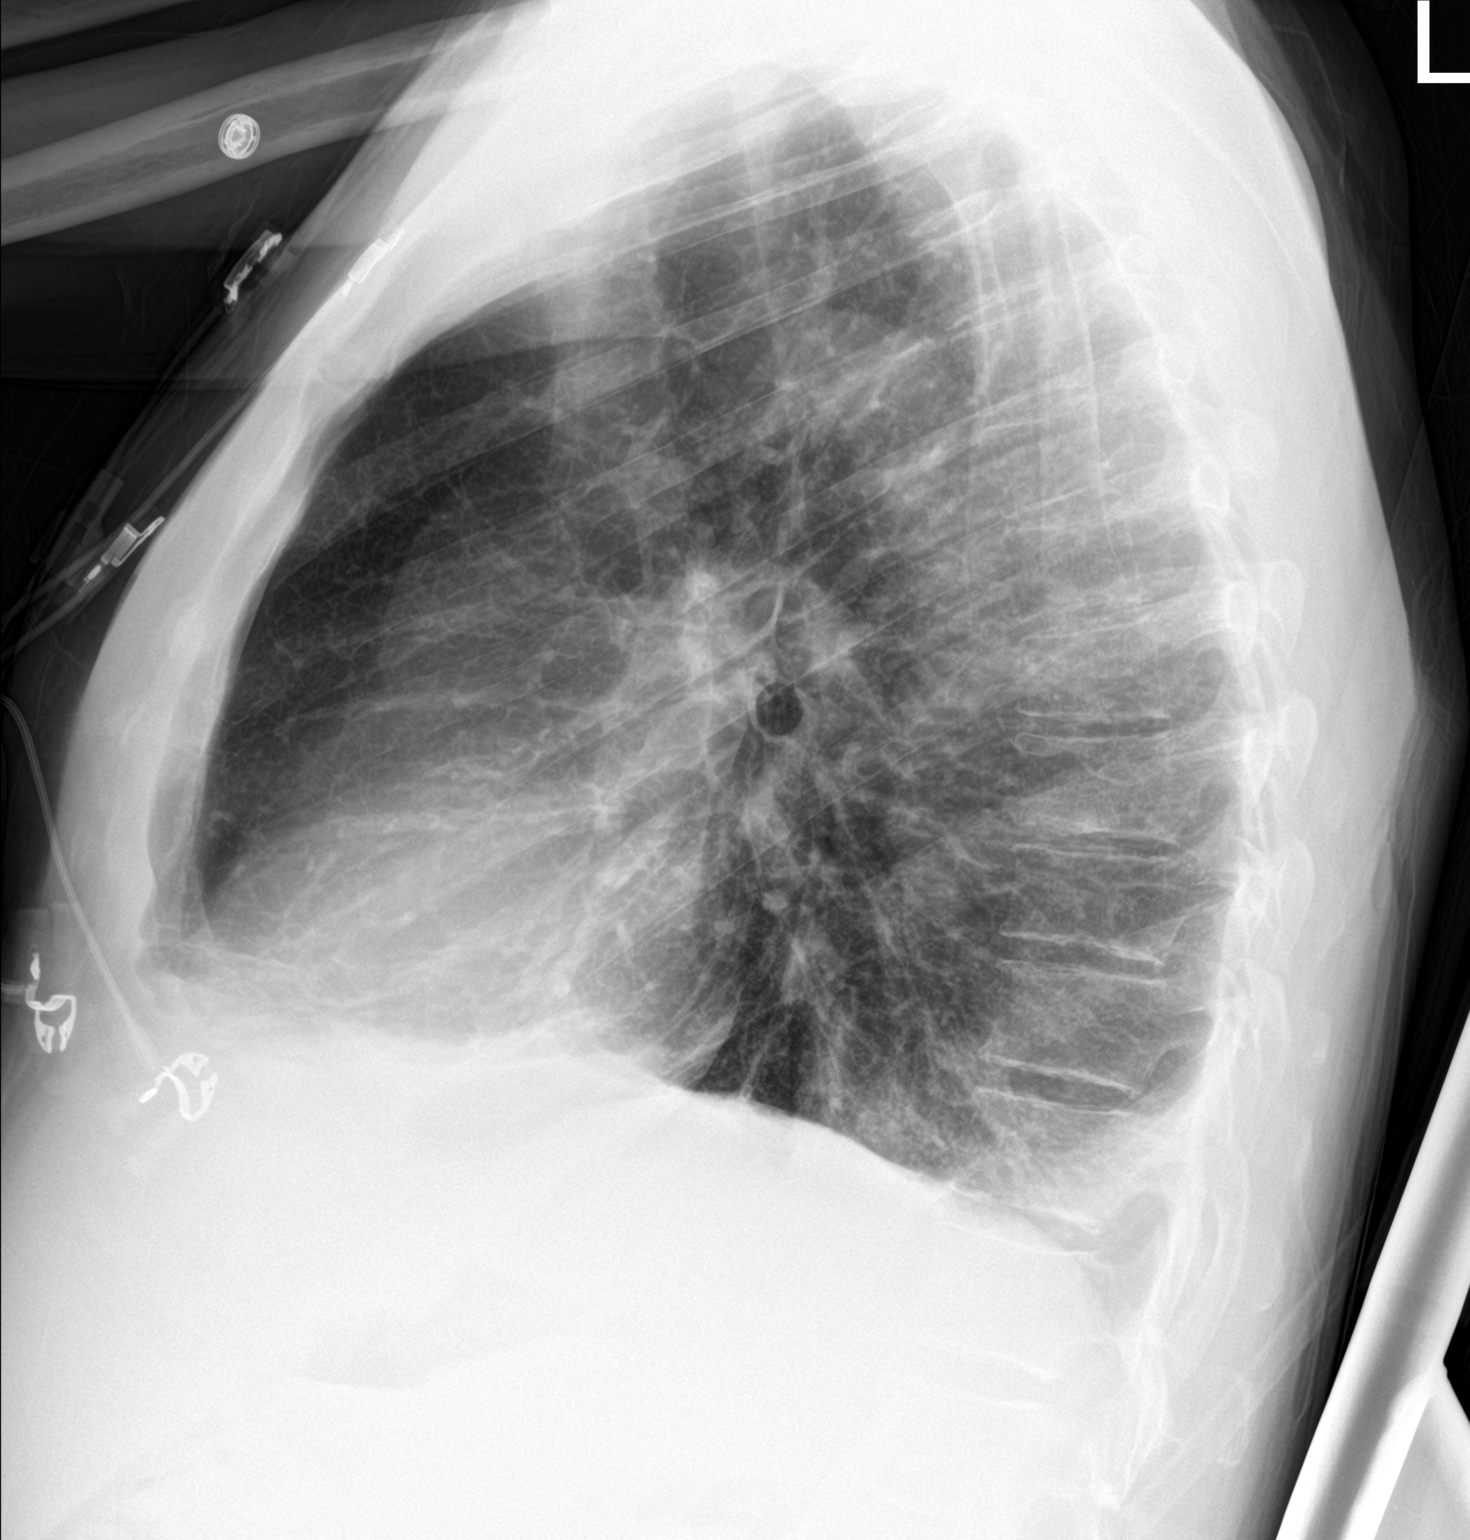

[chest ap]
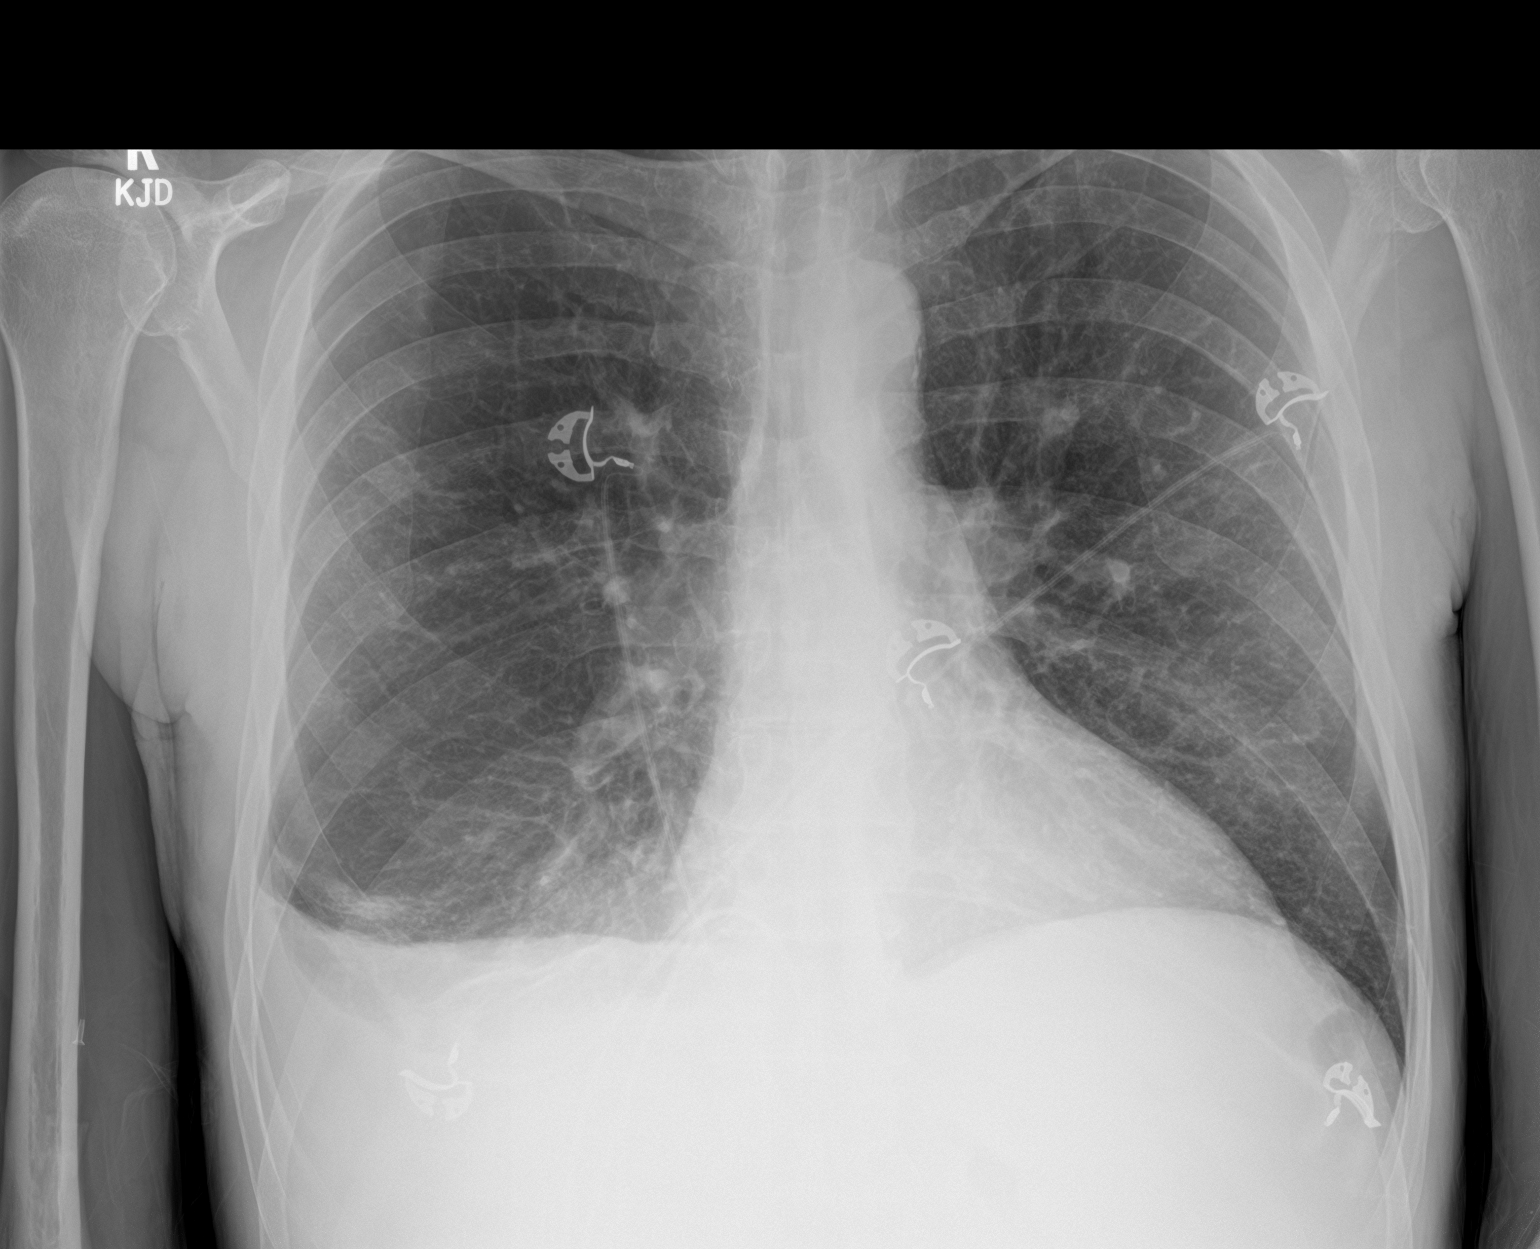

[2 of 2 positions shown; findings below may reference images not displayed]

FINDINGS: Small right layering pleural effusion. Stable cardiomegaly. Chronic
bronchitic changes, interstitial prominence and hyperinflation.
Interval resolution of pulmonary vascular congestion and mild edema.
No pneumothorax. No acute osseous abnormality.
IMPRESSION: 1. Small right pleural effusion.
2. Interval resolution of pulmonary vascular congestion and mild
interstitial edema.
3. Background pulmonary hyperinflation and chronic bronchitic
changes suggest underlying COPD.

## 2021-05-03 ENCOUNTER — Other Ambulatory Visit (HOSPITAL_COMMUNITY): Payer: Self-pay

## 2022-04-18 ENCOUNTER — Other Ambulatory Visit (HOSPITAL_COMMUNITY): Payer: Self-pay

## 2022-08-31 ENCOUNTER — Other Ambulatory Visit (HOSPITAL_COMMUNITY): Payer: Self-pay
